# Patient Record
Sex: Male | Born: 1970
Health system: Southern US, Community
[De-identification: ages and names within clinical notes are randomized; demographics above are authoritative.]

## PROBLEM LIST (undated history)

## (undated) DIAGNOSIS — J45909 Unspecified asthma, uncomplicated: Secondary | ICD-10-CM

## (undated) DIAGNOSIS — D649 Anemia, unspecified: Secondary | ICD-10-CM

## (undated) DIAGNOSIS — I10 Essential (primary) hypertension: Secondary | ICD-10-CM

## (undated) DIAGNOSIS — R000 Tachycardia, unspecified: Secondary | ICD-10-CM

## (undated) DIAGNOSIS — T7840XA Allergy, unspecified, initial encounter: Secondary | ICD-10-CM

## (undated) DIAGNOSIS — K852 Alcohol induced acute pancreatitis without necrosis or infection: Secondary | ICD-10-CM

## (undated) DIAGNOSIS — F419 Anxiety disorder, unspecified: Secondary | ICD-10-CM

## (undated) DIAGNOSIS — J40 Bronchitis, not specified as acute or chronic: Secondary | ICD-10-CM

## (undated) HISTORY — PX: CYSTECTOMY: SUR359

## (undated) HISTORY — PX: HERNIA REPAIR: SHX51

## (undated) HISTORY — DX: Anxiety disorder, unspecified: F41.9

## (undated) HISTORY — DX: Essential (primary) hypertension: I10

## (undated) HISTORY — DX: Tachycardia, unspecified: R00.0

## (undated) HISTORY — DX: Allergy, unspecified, initial encounter: T78.40XA

---

## 2003-12-20 ENCOUNTER — Emergency Department (HOSPITAL_COMMUNITY): Admission: EM | Admit: 2003-12-20 | Discharge: 2003-12-20 | Payer: Self-pay | Admitting: Emergency Medicine

## 2004-05-01 ENCOUNTER — Encounter: Admission: RE | Admit: 2004-05-01 | Discharge: 2004-05-01 | Payer: Self-pay | Admitting: Orthopedic Surgery

## 2006-01-12 ENCOUNTER — Emergency Department (HOSPITAL_COMMUNITY): Admission: EM | Admit: 2006-01-12 | Discharge: 2006-01-12 | Payer: Self-pay | Admitting: Emergency Medicine

## 2007-06-26 ENCOUNTER — Emergency Department (HOSPITAL_COMMUNITY): Admission: EM | Admit: 2007-06-26 | Discharge: 2007-06-27 | Payer: Self-pay | Admitting: Emergency Medicine

## 2011-05-06 LAB — BASIC METABOLIC PANEL
BUN: 5 — ABNORMAL LOW
CO2: 26
Calcium: 8.7
Chloride: 105
Creatinine, Ser: 0.99
GFR calc Af Amer: >60
GFR calc non Af Amer: >60
Glucose, Bld: 109 — ABNORMAL HIGH
Potassium: 4.4
Sodium: 139

## 2011-05-06 LAB — URINALYSIS, ROUTINE W REFLEX MICROSCOPIC
Bilirubin Urine: NEGATIVE
Glucose, UA: NEGATIVE
Hgb urine dipstick: NEGATIVE
Ketones, ur: NEGATIVE
Nitrite: NEGATIVE
Protein, ur: NEGATIVE
Specific Gravity, Urine: 1.01
Urobilinogen, UA: 0.2
pH: 5.5

## 2011-05-06 LAB — RAPID URINE DRUG SCREEN, HOSP PERFORMED
Amphetamines: NOT DETECTED
Barbiturates: NOT DETECTED
Benzodiazepines: NOT DETECTED
Cocaine: POSITIVE — AB
Opiates: NOT DETECTED
Tetrahydrocannabinol: NOT DETECTED

## 2011-05-06 LAB — DIFFERENTIAL
Basophils Absolute: 0
Basophils Relative: 0
Eosinophils Absolute: 0.1 — ABNORMAL LOW
Eosinophils Relative: 1
Lymphocytes Relative: 37
Lymphs Abs: 3.4
Monocytes Absolute: 0.5
Monocytes Relative: 5
Neutro Abs: 5.3
Neutrophils Relative %: 57

## 2011-05-06 LAB — CBC
HCT: 55.4 — ABNORMAL HIGH
Hemoglobin: 18.9 — ABNORMAL HIGH
MCHC: 34.1
MCV: 88.5
Platelets: 209
RBC: 6.26 — ABNORMAL HIGH
RDW: 13.1
WBC: 9.3

## 2011-05-06 LAB — ETHANOL
Alcohol, Ethyl (B): 361 — ABNORMAL HIGH
Alcohol, Ethyl (B): 68 — ABNORMAL HIGH

## 2011-07-17 ENCOUNTER — Other Ambulatory Visit: Payer: Self-pay

## 2011-07-17 ENCOUNTER — Emergency Department (HOSPITAL_COMMUNITY)
Admission: EM | Admit: 2011-07-17 | Discharge: 2011-07-18 | Disposition: A | Discharge status: Home / Self Care | Payer: Self-pay | Attending: Emergency Medicine | Admitting: Emergency Medicine

## 2011-07-17 ENCOUNTER — Emergency Department (HOSPITAL_COMMUNITY): Payer: Self-pay

## 2011-07-17 ENCOUNTER — Encounter: Payer: Self-pay | Admitting: Emergency Medicine

## 2011-07-17 DIAGNOSIS — R079 Chest pain, unspecified: Secondary | ICD-10-CM | POA: Insufficient documentation

## 2011-07-17 DIAGNOSIS — T7840XA Allergy, unspecified, initial encounter: Secondary | ICD-10-CM | POA: Insufficient documentation

## 2011-07-17 DIAGNOSIS — R059 Cough, unspecified: Secondary | ICD-10-CM | POA: Insufficient documentation

## 2011-07-17 DIAGNOSIS — F411 Generalized anxiety disorder: Secondary | ICD-10-CM | POA: Insufficient documentation

## 2011-07-17 DIAGNOSIS — J209 Acute bronchitis, unspecified: Secondary | ICD-10-CM | POA: Insufficient documentation

## 2011-07-17 DIAGNOSIS — R05 Cough: Secondary | ICD-10-CM | POA: Insufficient documentation

## 2011-07-17 DIAGNOSIS — F17200 Nicotine dependence, unspecified, uncomplicated: Secondary | ICD-10-CM | POA: Insufficient documentation

## 2011-07-17 DIAGNOSIS — R42 Dizziness and giddiness: Secondary | ICD-10-CM | POA: Insufficient documentation

## 2011-07-17 DIAGNOSIS — I10 Essential (primary) hypertension: Secondary | ICD-10-CM | POA: Insufficient documentation

## 2011-07-17 DIAGNOSIS — R110 Nausea: Secondary | ICD-10-CM | POA: Insufficient documentation

## 2011-07-17 LAB — BASIC METABOLIC PANEL
BUN: 3 mg/dL — ABNORMAL LOW (ref 6–23)
CO2: 20 mEq/L (ref 19–32)
Calcium: 9.7 mg/dL (ref 8.4–10.5)
Chloride: 98 mEq/L (ref 96–112)
Creatinine, Ser: 0.6 mg/dL (ref 0.50–1.35)
GFR calc Af Amer: >90 mL/min (ref >90)
GFR calc non Af Amer: >90 mL/min (ref >90)
Glucose, Bld: 88 mg/dL (ref 70–99)
Potassium: 3.8 mEq/L (ref 3.5–5.1)
Sodium: 136 mEq/L (ref 135–145)

## 2011-07-17 LAB — CBC
HCT: 51.1 % (ref 39.0–52.0)
Hemoglobin: 17.5 g/dL — ABNORMAL HIGH (ref 13.0–17.0)
MCH: 30.7 pg (ref 26.0–34.0)
MCHC: 34.2 g/dL (ref 30.0–36.0)
MCV: 89.6 fL (ref 78.0–100.0)
Platelets: 144 10*3/uL — ABNORMAL LOW (ref 150–400)
RBC: 5.7 MIL/uL (ref 4.22–5.81)
RDW: 13.1 % (ref 11.5–15.5)
WBC: 11 10*3/uL — ABNORMAL HIGH (ref 4.0–10.5)

## 2011-07-17 LAB — URINALYSIS, ROUTINE W REFLEX MICROSCOPIC
Bilirubin Urine: NEGATIVE
Glucose, UA: NEGATIVE mg/dL
Hgb urine dipstick: NEGATIVE
Ketones, ur: NEGATIVE mg/dL
Leukocytes, UA: NEGATIVE
Nitrite: NEGATIVE
Protein, ur: NEGATIVE mg/dL
Specific Gravity, Urine: <1.005 — ABNORMAL LOW (ref 1.005–1.030)
Urobilinogen, UA: 0.2 mg/dL (ref 0.0–1.0)
pH: 6 (ref 5.0–8.0)

## 2011-07-17 LAB — CARDIAC PANEL(CRET KIN+CKTOT+MB+TROPI)
CK, MB: 2.9 ng/mL (ref 0.3–4.0)
Relative Index: 2.7 — ABNORMAL HIGH (ref 0.0–2.5)
Total CK: 109 U/L (ref 7–232)
Troponin I: <0.3 ng/mL (ref <0.30)

## 2011-07-17 LAB — POCT I-STAT TROPONIN I: Troponin i, poc: 0 ng/mL (ref 0.00–0.08)

## 2011-07-17 LAB — D-DIMER, QUANTITATIVE: D-Dimer, Quant: 0.52 ug/mL-FEU — ABNORMAL HIGH (ref 0.00–0.48)

## 2011-07-17 MED ORDER — SODIUM CHLORIDE 0.9 % IV SOLN
Freq: Once | INTRAVENOUS | Status: AC
  Administered 2011-07-17: via INTRAVENOUS

## 2011-07-17 MED ORDER — FAMOTIDINE IN NACL 20-0.9 MG/50ML-% IV SOLN
20.0000 mg | Freq: Once | INTRAVENOUS | Status: AC
  Administered 2011-07-17: 20 mg via INTRAVENOUS
  Filled 2011-07-17: qty 50

## 2011-07-17 MED ORDER — AZITHROMYCIN 250 MG PO TABS: ORAL_TABLET | ORAL | Status: DC

## 2011-07-17 MED ORDER — METHYLPREDNISOLONE SODIUM SUCC 125 MG IJ SOLR
125.0000 mg | Freq: Once | INTRAMUSCULAR | Status: AC
  Administered 2011-07-17: 125 mg via INTRAVENOUS
  Filled 2011-07-17: qty 2

## 2011-07-17 MED ORDER — DIPHENHYDRAMINE HCL 50 MG/ML IJ SOLN
25.0000 mg | Freq: Once | INTRAMUSCULAR | Status: AC
  Administered 2011-07-17: 25 mg via INTRAVENOUS
  Filled 2011-07-17: qty 1

## 2011-07-17 MED ORDER — DIPHENHYDRAMINE HCL 25 MG PO TABS: ORAL_TABLET | ORAL | Status: DC

## 2011-07-17 MED ORDER — AZITHROMYCIN 250 MG PO TABS
500.0000 mg | ORAL_TABLET | Freq: Once | ORAL | Status: AC
  Administered 2011-07-17: 500 mg via ORAL
  Filled 2011-07-17: qty 2

## 2011-07-17 NOTE — ED Provider Notes (Cosign Needed)
History     CSN: [id number]  Arrival date & time 07/17/11  2137   First MD Initiated Contact with Patient 07/17/11 2149      Chief Complaint  Patient presents with  . Chest Pain    (Consider location/radiation/quality/duration/timing/severity/associated sxs/prior treatment) HPI  History reviewed. No pertinent past medical history.  Past Surgical History  Procedure Date  . Hernia repair   . Cystectomy     No family history on file.  History  Substance Use Topics  . Smoking status: Current Some Day Smoker  . Smokeless tobacco: Not on file  . Alcohol Use: Yes      Review of Systems  Allergies  Grapefruit diet or  Home Medications  No current outpatient prescriptions on file.  BP 143/91  Pulse 120  Temp(Src) 99.1 F (37.3 C) (Oral)  Resp 16  Ht 5\' 9"  (1.753 m)  Wt 167 lb (75.751 kg)  BMI 24.66 kg/m2  SpO2 100%  Physical Exam  ED Course  Procedures (including critical care time)   9:49 PM  Date: 07/17/2011  Rate:118  Rhythm: sinus tachycardia  QRS Axis: normal  Intervals: normal PQRS:  Left atrial hypertrophy  ST/T Wave abnormalities: normal  Conduction Disutrbances:none  Narrative Interpretation: Borderline EKG  Old EKG Reviewed: none available    1. Acute bronchitis   2. Allergic reaction            Carleene Cooper III, MD 07/18/11 [phone]

## 2011-07-17 NOTE — ED Provider Notes (Signed)
History  Scribed for Carleene Cooper III, MD, the patient was seen in room [facility]04/[facility]04. This chart was scribed by Candelaria Stagers. The patient's care started at 9:55 PM    CSN: [id number]  Arrival date & time 07/17/11  2137   First MD Initiated Contact with Patient 07/17/11 2149      Chief Complaint  Patient presents with  . Chest Pain     The history is provided by the patient.   Lee Sparks is a 40 y.o. male who presents to the Emergency Department complaining of constant stabbing chest pain on both sides of the chest that started about three days ago.  He reports that he was sick with pneumonia, fever and cold sx about one month ago and is feeling similar sx now.  Pt states he is feeling anxiety from these sx, experiencing a productive cough, nausea, light headedness.  He denies dysuria, rash, and syncope.  He has not taken any medication for this pain.  Has h/o of hiatal hernia repair surgery.  Pt reports he occasionally smokes.    History reviewed. No pertinent past medical history.  Past Surgical History  Procedure Date  . Hernia repair   . Cystectomy     No family history on file.  History  Substance Use Topics  . Smoking status: Current Some Day Smoker  . Smokeless tobacco: Not on file  . Alcohol Use: Yes      Review of Systems  Constitutional: Negative for fever.       10 Systems reviewed and are negative for acute change except as noted in the HPI.  HENT: Negative for congestion.   Eyes: Negative for discharge and redness.  Respiratory: Negative for cough and shortness of breath.   Cardiovascular: Positive for chest pain.  Gastrointestinal: Positive for nausea. Negative for vomiting and abdominal pain.  Musculoskeletal: Negative for back pain.  Skin: Positive for color change. Negative for rash.  Neurological: Positive for light-headedness. Negative for syncope, numbness and headaches.  Psychiatric/Behavioral:       No behavior change.    Allergies    Grapefruit diet or  Home Medications   Current Outpatient Rx  Name Route Sig Dispense Refill  . ZANTAC PO Oral Take 1 tablet by mouth as needed. For heartburn       BP 143/91  Pulse 120  Temp(Src) 99.1 F (37.3 C) (Oral)  Resp 16  Ht 5\' 9"  (1.753 m)  Wt 167 lb (75.751 kg)  BMI 24.66 kg/m2  SpO2 100%  Physical Exam  Nursing note and vitals reviewed. Constitutional: He is oriented to person, place, and time. He appears well-developed and well-nourished. No distress.       Awake, alert, nontoxic appearance.  HENT:  Head: Normocephalic and atraumatic.  Mouth/Throat: No oropharyngeal exudate.       Tonsillar erythemas, throat is dry.   Eyes: Conjunctivae are normal. Pupils are equal, round, and reactive to light. Right eye exhibits no discharge. Left eye exhibits no discharge.  Neck: Normal range of motion. Neck supple.  Cardiovascular: Normal rate and regular rhythm.  Exam reveals no gallop and no friction rub.   No murmur heard. Pulmonary/Chest: Effort normal. He exhibits no tenderness.  Abdominal: Soft. There is no tenderness. There is no rebound.  Musculoskeletal: He exhibits no edema and no tenderness.       Baseline ROM, no obvious new focal weakness.  Lymphadenopathy:    He has no cervical adenopathy.  Neurological: He is alert and oriented  to person, place, and time.       Mental status and motor strength appears baseline for patient and situation.  Skin: No rash noted. There is erythema.  Psychiatric: He has a normal mood and affect.    ED Course  Procedures  DIAGNOSTIC STUDIES: Oxygen Saturation is 100% on room air, normal by my interpretation.    COORDINATION OF CARE: 9:52PM Ordered: New - ED EKG ; I-Stat tropoinin I cardiac marker ; CBC ; Basic metabolic panel ; DG Chest Portable 1 View  10:02PM Ordered: URINALYSIS, ROUTINE W REFLEX MICROSCOPIC, D-DIMER, QUANTITATIVE, CARDIAC PANEL(CRET KIN+CKTOT+MB+TROPI)  10:38PM Ordered: DG Chest 2 View  11:21PM  Recheck: Discussed course of care with pt.   Labs Reviewed  CBC - Abnormal; Notable for the following:    WBC 11.0 (*)    Hemoglobin 17.5 (*)    Platelets 144 (*)    All other components within normal limits  BASIC METABOLIC PANEL - Abnormal; Notable for the following:    BUN 3 (*)    All other components within normal limits  CARDIAC PANEL(CRET KIN+CKTOT+MB+TROPI) - Abnormal; Notable for the following:    Relative Index 2.7 (*)    All other components within normal limits  D-DIMER, QUANTITATIVE - Abnormal; Notable for the following:    D-Dimer, Quant 0.52 (*)    All other components within normal limits  URINALYSIS, ROUTINE W REFLEX MICROSCOPIC - Abnormal; Notable for the following:    Specific Gravity, Urine <1.005 (*)    All other components within normal limits  POCT I-STAT TROPONIN I   *RADIOLOGY REPORT*  Clinical Data: Possible opacity on the portable view.  CHEST - 2 VIEW  Comparison: 07/17/2011  Findings: Questionable lesion on portable view corresponds to  callus formation along the posterolateral 6th rib on the right,  suggesting remote injury. No lung or acute osseous abnormality  identified. Cardiomediastinal contours within normal limits.  IMPRESSION:  No acute cardiopulmonary process.  Original Report Authenticated By: Waneta Martins, M.D.  RADIOLOGY REPORT* Clinical Data: Chest pain PORTABLE CHEST - 1 VIEW Comparison: 12/20/2003 Findings: Nodular opacity projecting over the right scapula. Lungs are otherwise clear. No pleural effusion or pneumothorax. The cardiomediastinal contours are within normal limits. The visualized bones and soft tissues are without significant appreciable abnormality. IMPRESSION: Nodular opacity projecting over the right scapula. May represent superimposed shadows. Recommend PA and lateral radiographs when the patient can tolerate to better characterize. Original Report Authenticated By: Waneta Martins, M.D.  9:49 PM   Date: 07/17/2011  Rate:118  Rhythm: sinus tachycardia  QRS Axis: normal  Intervals: normal PQRS:  Left atrial hypertrophy  ST/T Wave abnormalities: normal  Conduction Disutrbances:none  Narrative Interpretation: Borderline EKG  Old EKG Reviewed: none available    Diagnosis: Acute Bronchitis and acute allergic reaction to unknown allergen.   1. Acute bronchitis   2. Allergic reaction     Will prescribe benadryl, zantac, solumedrol, and antibiotic for bronchitis.   I personally performed the services described in this documentation, which was scribed in my presence. The recorded information has been reviewed and considered.  Carleene Cooper III M.D.   Carleene Cooper III, MD 07/18/11 [phone]

## 2011-07-17 NOTE — ED Notes (Signed)
Per EMS, pt has been having chest pain for the past three days.  Pt reports productive cough x three days.  Pt reports he drank a 6 pack of beer prior to calling EMS.

## 2011-07-18 NOTE — ED Notes (Signed)
Patient is alert and oriented x 4 with respirations even and unlabored.  NAD at this time.  Discharge instructions reviewed with patient and patient verbalized understanding.  Pt ambulated to lobby with steady gait, and father to transport pt home.

## 2011-07-27 ENCOUNTER — Other Ambulatory Visit: Payer: Self-pay

## 2011-07-27 ENCOUNTER — Encounter (HOSPITAL_COMMUNITY): Payer: Self-pay | Admitting: Emergency Medicine

## 2011-07-27 ENCOUNTER — Inpatient Hospital Stay (HOSPITAL_COMMUNITY)
Admission: EM | Admit: 2011-07-27 | Discharge: 2011-08-01 | DRG: 369 | Disposition: A | Discharge status: Home / Self Care | Payer: Self-pay | Attending: Internal Medicine | Admitting: Internal Medicine

## 2011-07-27 DIAGNOSIS — F10231 Alcohol dependence with withdrawal delirium: Secondary | ICD-10-CM | POA: Diagnosis present

## 2011-07-27 DIAGNOSIS — F10939 Alcohol use, unspecified with withdrawal, unspecified: Secondary | ICD-10-CM | POA: Diagnosis present

## 2011-07-27 DIAGNOSIS — F10931 Alcohol use, unspecified with withdrawal delirium: Secondary | ICD-10-CM | POA: Diagnosis present

## 2011-07-27 DIAGNOSIS — D696 Thrombocytopenia, unspecified: Secondary | ICD-10-CM | POA: Diagnosis present

## 2011-07-27 DIAGNOSIS — R109 Unspecified abdominal pain: Secondary | ICD-10-CM | POA: Diagnosis present

## 2011-07-27 DIAGNOSIS — F1410 Cocaine abuse, uncomplicated: Secondary | ICD-10-CM | POA: Diagnosis present

## 2011-07-27 DIAGNOSIS — F10239 Alcohol dependence with withdrawal, unspecified: Secondary | ICD-10-CM | POA: Diagnosis present

## 2011-07-27 DIAGNOSIS — Z87891 Personal history of nicotine dependence: Secondary | ICD-10-CM

## 2011-07-27 DIAGNOSIS — K922 Gastrointestinal hemorrhage, unspecified: Secondary | ICD-10-CM | POA: Diagnosis present

## 2011-07-27 DIAGNOSIS — R630 Anorexia: Secondary | ICD-10-CM | POA: Diagnosis present

## 2011-07-27 DIAGNOSIS — K226 Gastro-esophageal laceration-hemorrhage syndrome: Principal | ICD-10-CM | POA: Diagnosis present

## 2011-07-27 DIAGNOSIS — R000 Tachycardia, unspecified: Secondary | ICD-10-CM | POA: Diagnosis present

## 2011-07-27 DIAGNOSIS — I10 Essential (primary) hypertension: Secondary | ICD-10-CM | POA: Diagnosis present

## 2011-07-27 DIAGNOSIS — F1020 Alcohol dependence, uncomplicated: Secondary | ICD-10-CM | POA: Diagnosis present

## 2011-07-27 HISTORY — DX: Anemia, unspecified: D64.9

## 2011-07-27 LAB — CBC
Hemoglobin: 17.1 g/dL — ABNORMAL HIGH (ref 13.0–17.0)
MCH: 30.6 pg (ref 26.0–34.0)
RBC: 5.58 MIL/uL (ref 4.22–5.81)

## 2011-07-27 LAB — DIFFERENTIAL
Eosinophils Absolute: 0 10*3/uL (ref 0.0–0.7)
Lymphs Abs: 1.5 10*3/uL (ref 0.7–4.0)
Monocytes Relative: 10 % (ref 3–12)
Neutro Abs: 12.5 10*3/uL — ABNORMAL HIGH (ref 1.7–7.7)
Neutrophils Relative %: 80 % — ABNORMAL HIGH (ref 43–77)

## 2011-07-27 LAB — COMPREHENSIVE METABOLIC PANEL
Alkaline Phosphatase: 98 U/L (ref 39–117)
BUN: 18 mg/dL (ref 6–23)
Chloride: 94 mEq/L — ABNORMAL LOW (ref 96–112)
GFR calc Af Amer: >90 mL/min (ref >90)
GFR calc non Af Amer: >90 mL/min (ref >90)
Glucose, Bld: 141 mg/dL — ABNORMAL HIGH (ref 70–99)
Potassium: 4.1 mEq/L (ref 3.5–5.1)
Total Bilirubin: 2 mg/dL — ABNORMAL HIGH (ref 0.3–1.2)

## 2011-07-27 LAB — APTT: aPTT: 28 seconds (ref 24–37)

## 2011-07-27 MED ORDER — LORAZEPAM 2 MG/ML IJ SOLN
1.0000 mg | Freq: Once | INTRAMUSCULAR | Status: AC
  Administered 2011-07-27: 1 mg via INTRAVENOUS
  Filled 2011-07-27: qty 1

## 2011-07-27 MED ORDER — HYDROMORPHONE HCL PF 1 MG/ML IJ SOLN
0.5000 mg | INTRAMUSCULAR | Status: DC | PRN
  Administered 2011-07-27 – 2011-07-29 (×5): 0.5 mg via INTRAVENOUS
  Filled 2011-07-27 (×6): qty 1

## 2011-07-27 MED ORDER — THIAMINE HCL 100 MG/ML IJ SOLN
100.0000 mg | Freq: Every day | INTRAMUSCULAR | Status: DC
  Administered 2011-07-29: 100 mg via INTRAVENOUS
  Filled 2011-07-27 (×6): qty 1

## 2011-07-27 MED ORDER — LORAZEPAM 2 MG/ML IJ SOLN: 1.0000 mg | Freq: Four times a day (QID) | INTRAMUSCULAR | Status: AC | PRN

## 2011-07-27 MED ORDER — SODIUM CHLORIDE 0.9 % IV BOLUS (SEPSIS)
1000.0000 mL | Freq: Once | INTRAVENOUS | Status: AC
  Administered 2011-07-27: 1000 mL via INTRAVENOUS

## 2011-07-27 MED ORDER — LORAZEPAM 2 MG/ML IJ SOLN: 0.0000 mg | Freq: Four times a day (QID) | INTRAMUSCULAR | Status: AC

## 2011-07-27 MED ORDER — FAMOTIDINE IN NACL 20-0.9 MG/50ML-% IV SOLN
20.0000 mg | Freq: Once | INTRAVENOUS | Status: AC
  Administered 2011-07-27: 20 mg via INTRAVENOUS
  Filled 2011-07-27: qty 50

## 2011-07-27 MED ORDER — SODIUM CHLORIDE 0.9 % IV SOLN
8.0000 mg/h | INTRAVENOUS | Status: DC
  Administered 2011-07-27: 8 mg/h via INTRAVENOUS
  Filled 2011-07-27 (×6): qty 80

## 2011-07-27 MED ORDER — ONDANSETRON HCL 4 MG/2ML IJ SOLN
4.0000 mg | Freq: Once | INTRAMUSCULAR | Status: AC
  Administered 2011-07-27: 4 mg via INTRAVENOUS
  Filled 2011-07-27: qty 2

## 2011-07-27 MED ORDER — PANTOPRAZOLE SODIUM 40 MG IV SOLR
40.0000 mg | Freq: Once | INTRAVENOUS | Status: AC
  Administered 2011-07-27: 40 mg via INTRAVENOUS
  Filled 2011-07-27: qty 40

## 2011-07-27 MED ORDER — POTASSIUM CHLORIDE IN NACL 20-0.9 MEQ/L-% IV SOLN
INTRAVENOUS | Status: DC
  Administered 2011-07-27 – 2011-07-29 (×4): via INTRAVENOUS
  Filled 2011-07-27 (×6): qty 1000

## 2011-07-27 MED ORDER — VITAMIN B-1 100 MG PO TABS
100.0000 mg | ORAL_TABLET | Freq: Every day | ORAL | Status: DC
  Administered 2011-07-27 – 2011-08-01 (×5): 100 mg via ORAL
  Filled 2011-07-27 (×6): qty 1

## 2011-07-27 MED ORDER — FOLIC ACID 1 MG PO TABS
1.0000 mg | ORAL_TABLET | Freq: Every day | ORAL | Status: DC
  Administered 2011-07-27 – 2011-08-01 (×6): 1 mg via ORAL
  Filled 2011-07-27 (×6): qty 1

## 2011-07-27 MED ORDER — LORAZEPAM 1 MG PO TABS
1.0000 mg | ORAL_TABLET | Freq: Four times a day (QID) | ORAL | Status: AC | PRN
  Administered 2011-07-30: 1 mg via ORAL
  Filled 2011-07-27: qty 1

## 2011-07-27 MED ORDER — SODIUM CHLORIDE 0.9 % IV SOLN
Freq: Once | INTRAVENOUS | Status: AC
  Administered 2011-07-27: 17:00:00 via INTRAVENOUS

## 2011-07-27 MED ORDER — LORAZEPAM 2 MG/ML IJ SOLN
0.0000 mg | Freq: Two times a day (BID) | INTRAMUSCULAR | Status: AC
  Administered 2011-07-31: 1 mg via INTRAVENOUS
  Filled 2011-07-27: qty 1

## 2011-07-27 NOTE — ED Provider Notes (Signed)
History   This chart was scribed for Toy Baker, MD by Clarita Crane. The patient was seen in room APA12/[facility]12 and the patient's care was started at 2:16PM.   CSN: [id number]  Arrival date & time 07/27/11  1323   First MD Initiated Contact with Patient 07/27/11 1413      Chief Complaint  Patient presents with  . Hemoptysis  . Emesis  . Chest Pain  . Weakness    (Consider location/radiation/quality/duration/timing/severity/associated sxs/prior treatment) HPI Lee Sparks is a 40 y.o. male who presents to the Emergency Department complaining of multiple episodes of severe hematemesis onset last night and persistent since with associated abdominal pain, decreased appetite, decreased fluid intake and lightheadedness. Reports having approximately 5-7 episodes of hematemesis since last night.  Patient reports extensive ETOH use stating he drinks approximately two 40oz beers per day. Denies melena, previous similar symptoms and history of bleeding ulcers. Nothing makes his sx better or worse  History reviewed. No pertinent past medical history.  Past Surgical History  Procedure Date  . Hernia repair   . Cystectomy     History reviewed. No pertinent family history.  History  Substance Use Topics  . Smoking status: Former Smoker -- 0.0 packs/day for 15 years    Types: Cigarettes    Quit date: 06/27/2011  . Smokeless tobacco: Never Used  . Alcohol Use: 8.4 oz/week    14 Cans of beer per week      Review of Systems 10 Systems reviewed and are negative for acute change except as noted in the HPI.  Allergies  Grapefruit diet or  Home Medications   Current Outpatient Rx  Name Route Sig Dispense Refill  . AZITHROMYCIN 250 MG PO TABS  Take first 2 tablets today, then 1 every day until finished. 6 tablet 0  . DIPHENHYDRAMINE HCL 25 MG PO TABS  Take 2 capsule every six hours if needed for allergic redness and itching.  Buy over-the-counter. 20 tablet 0  . ZANTAC PO Oral  Take 1 tablet by mouth as needed. For heartburn       BP 145/116  Pulse 130  Resp 20  Ht 5\' 8"  (1.727 m)  Wt 165 lb (74.844 kg)  BMI 25.09 kg/m2  SpO2 98%  Physical Exam  Nursing note and vitals reviewed. Constitutional: He is oriented to person, place, and time. He appears well-developed and well-nourished.  HENT:  Head: Normocephalic and atraumatic.  Eyes: EOM are normal. Pupils are equal, round, and reactive to light.  Neck: Neck supple. No tracheal deviation present.  Cardiovascular: Regular rhythm.  Tachycardia present.   No murmur heard. Pulmonary/Chest: Effort normal. No respiratory distress. He has no wheezes.  Abdominal: Soft. He exhibits no distension. There is no tenderness. There is no rebound and no guarding.  Musculoskeletal: Normal range of motion. He exhibits no edema.  Neurological: He is alert and oriented to person, place, and time. No sensory deficit.  Skin: There is pallor.       Slightly pale.   Psychiatric: His behavior is normal.    ED Course  Procedures (including critical care time)  DIAGNOSTIC STUDIES: Oxygen Saturation is 98% on room air, normal by my interpretation.    COORDINATION OF CARE: 2:23PM- Patient explained current clinical impression and intent to obtain multiple blood labs and administer antiemetics.   Labs Reviewed - No data to display No results found.   No diagnosis found.    MDM  Labs and iv fluids ordered and results  pending, pt signed out to on coming edp who will dispo  I personally performed the services described in this documentation, which was scribed in my presence. The recorded information has been reviewed and considered.        I personally performed the services described in this documentation, which was scribed in my presence. The recorded information has been reviewed and considered.     Toy Baker, MD 08/06/11 [phone]

## 2011-07-27 NOTE — ED Notes (Signed)
Patient c/o nausea, vomiting, coughing up blood, and "racing heart beat" Since last night. Per patient was coughing up a large amount of blood but it's starting to decrease.

## 2011-07-27 NOTE — Progress Notes (Signed)
1510 Assumed care/disposition of patient. Patient with hemoptysis x 6 since last night. Heavy ETOH use in last few days. VSS. Marland Kitchen Awaiting labs and then admission.  Date: 07/27/2011  1413  Rate: 115  Rhythm: sinus tachycardia  QRS Axis: normal  Intervals: normal  ST/T Wave abnormalities: normal  Conduction Disutrbances:none  Narrative Interpretation:   Old EKG Reviewed: none available  Results for orders placed during the hospital encounter of 07/27/11  CBC      Component Value Range   WBC 15.7 (*) 4.0 - 10.5 (K/uL)   RBC 5.58  4.22 - 5.81 (MIL/uL)   Hemoglobin 17.1 (*) 13.0 - 17.0 (g/dL)   HCT 40.9  81.1 - 91.4 (%)   MCV 91.0  78.0 - 100.0 (fL)   MCH 30.6  26.0 - 34.0 (pg)   MCHC 33.7  30.0 - 36.0 (g/dL)   RDW 78.2  95.6 - 21.3 (%)   Platelets 107 (*) 150 - 400 (K/uL)  DIFFERENTIAL      Component Value Range   Neutrophils Relative 80 (*) 43 - 77 (%)   Neutro Abs 12.5 (*) 1.7 - 7.7 (K/uL)   Lymphocytes Relative 10 (*) 12 - 46 (%)   Lymphs Abs 1.5  0.7 - 4.0 (K/uL)   Monocytes Relative 10  3 - 12 (%)   Monocytes Absolute 1.6 (*) 0.1 - 1.0 (K/uL)   Eosinophils Relative 0  0 - 5 (%)   Eosinophils Absolute 0.0  0.0 - 0.7 (K/uL)   Basophils Relative 0  0 - 1 (%)   Basophils Absolute 0.1  0.0 - 0.1 (K/uL)  COMPREHENSIVE METABOLIC PANEL      Component Value Range   Sodium 137  135 - 145 (mEq/L)   Potassium 4.1  3.5 - 5.1 (mEq/L)   Chloride 94 (*) 96 - 112 (mEq/L)   CO2 26  19 - 32 (mEq/L)   Glucose, Bld 141 (*) 70 - 99 (mg/dL)   BUN 18  6 - 23 (mg/dL)   Creatinine, Ser 0.86  0.50 - 1.35 (mg/dL)   Calcium 57.8 (*) 8.4 - 10.5 (mg/dL)   Total Protein 8.3  6.0 - 8.3 (g/dL)   Albumin 4.3  3.5 - 5.2 (g/dL)   AST 72 (*) 0 - 37 (U/L)   ALT 52  0 - 53 (U/L)   Alkaline Phosphatase 98  39 - 117 (U/L)   Total Bilirubin 2.0 (*) 0.3 - 1.2 (mg/dL)   GFR calc non Af Amer >90  >90 (mL/min)   GFR calc Af Amer >90  >90 (mL/min)  APTT      Component Value Range   aPTT 28  24 - 37 (seconds)    PROTIME-INR      Component Value Range   Prothrombin Time 14.8  11.6 - 15.2 (seconds)   INR 1.14  0.00 - 1.49   LIPASE, BLOOD      Component Value Range   Lipase 22  11 - 59 (U/L)  SAMPLE TO BLOOD BANK      Component Value Range   Blood Bank Specimen SAMPLE AVAILABLE FOR TESTING     Sample Expiration 07/30/2011     ABO/RH(D) PENDING      1555 Labs with stable hgb. Elevated bilirubin. Call to hospitalist  for admission. 1610 Spoke with Dr. Sherrie Mustache. She advised there are no beds at [facility]. Will need to talk with hopitalist at [facility]. 1620 Advised by [facility] that there are 3 beds available.  1625 Spoke with Dr. Sherrie Mustache.  She advised that since there is no GI coverage at [facility], she would be unable to take the patient here.  1640 Spoke with Dr. Gerri Lins, hospitalist at [facility] She has accepted the patient in transfer. She requested a telemetry bed and that we give a benzodiazepine prior to sending patient. BP 146/95, HR 118, R20. The patient appears reasonably stabilized for transfer considering the current resources, flow, and capabilities available in the ED at this time, and I doubt any other [facility] requiring further screening and/or treatment in the ED prior to transfer.

## 2011-07-27 NOTE — H&P (Signed)
Lee Sparks is an 40 y.o. male.   Chief Complaint: Vomiting Blood HPI: Pt is 40 yr old male who drinks two forty oz beers a night as a matter of course.  Last night he began vomiting up blood clots.  He states that the blood has been "thick and clumpy", then thinner and lighter in color.  He states that most recent emesis has been thick and clumpy again.  He states that his last drink was last night at 8:00 pm and that he is starting to feel shakey.  Pt had an elevated BP and HR in ED at [facility].  History reviewed. No pertinent past medical history.  Past Surgical History  Procedure Date  . Hernia repair   . Cystectomy     Family History  Problem Relation Age of Onset  . Cancer Other    Social History:  reports that he quit smoking about 4 weeks ago. His smoking use included Cigarettes. He has a .45 pack-year smoking history. He has never used smokeless tobacco. He reports that he drinks about 30 ounces of alcohol per week. He reports that he does not use illicit drugs. Medications Prior to Admission  Medication Dose Route Frequency Provider Last Rate Last Dose  . 0.9 %  sodium chloride infusion   Intravenous Once Toy Baker, MD 125 mL/hr at 07/27/11 1649    . 0.9 % NaCl with KCl 20 mEq/ L  infusion   Intravenous Continuous Mozetta Murfin, DO      . famotidine (PEPCID) IVPB 20 mg  20 mg Intravenous Once [employer]. Colon Branch, MD   20 mg at 07/27/11 1720  . folic acid (FOLVITE) tablet 1 mg  1 mg Oral Daily Eloina Ergle, DO      . HYDROmorphone (DILAUDID) injection 0.5 mg  0.5 mg Intravenous Q3H PRN Antwione Picotte, DO      . LORazepam (ATIVAN) injection 0-4 mg  0-4 mg Intravenous Q6H Mackenzee Becvar, DO       Followed by  . LORazepam (ATIVAN) injection 0-4 mg  0-4 mg Intravenous Q12H Anuradha Chabot, DO      . LORazepam (ATIVAN) injection 1 mg  1 mg Intravenous Once [employer]. Colon Branch, MD   1 mg at 07/27/11 1720  . LORazepam (ATIVAN) tablet 1 mg  1 mg Oral Q6H PRN Kierstyn Baranowski, DO       Or  . LORazepam  (ATIVAN) injection 1 mg  1 mg Intravenous Q6H PRN Marek Nghiem, DO      . ondansetron (ZOFRAN) injection 4 mg  4 mg Intravenous Once Toy Baker, MD   4 mg at 07/27/11 1424  . pantoprazole (PROTONIX) 80 mg in sodium chloride 0.9 % 250 mL infusion  8 mg/hr Intravenous Continuous Kaikoa Magro, DO      . pantoprazole (PROTONIX) injection 40 mg  40 mg Intravenous Once Toy Baker, MD   40 mg at 07/27/11 1424  . sodium chloride 0.9 % bolus 1,000 mL  1,000 mL Intravenous Once Toy Baker, MD   1,000 mL at 07/27/11 1424  . sodium chloride 0.9 % bolus 1,000 mL  1,000 mL Intravenous Once [employer]. Colon Branch, MD   1,000 mL at 07/27/11 1650  . sodium chloride 0.9 % bolus 1,000 mL  1,000 mL Intravenous Once [employer]. Colon Branch, MD   1,000 mL at 07/27/11 1857  . thiamine (VITAMIN B-1) tablet 100 mg  100 mg Oral Daily Makyiah Lie, DO  Or  . thiamine (B-1) injection 100 mg  100 mg Intravenous Daily Rindy Kollman, DO       Medications Prior to Admission  Medication Sig Dispense Refill  . azithromycin (ZITHROMAX) 250 MG tablet Take first 2 tablets today, then 1 every day until finished.  6 tablet  0  . diphenhydrAMINE (BENADRYL) 25 MG tablet Take 2 capsule every six hours if needed for allergic redness and itching.  Buy over-the-counter.  20 tablet  0    Allergies:  Allergies  Allergen Reactions  . Grapefruit Diet Or (Extra Strength Grapefruit) Hives    Pt states he has a reaction to grapefruit.     Constitutional: positive for chills, fatigue and sweats, negative for anorexia, fevers and weight loss Eyes: negative for irritation, redness and visual disturbance Ears, nose, mouth, throat, and face: negative for epistaxis, nasal congestion and sore throat Respiratory: negative for cough, pleurisy/chest pain, sputum and wheezing Cardiovascular: negative for chest pain, chest pressure/discomfort, dyspnea, exertional chest pressure/discomfort, irregular heart beat and syncope Gastrointestinal: positive for  nausea and vomiting, negative for constipation, diarrhea, dyspepsia, dysphagia, jaundice and melena Genitourinary:negative for dysuria, frequency, hematuria and hesitancy Hematologic/lymphatic: positive for bleeding, negative for easy bruising, lymphadenopathy and petechiae Musculoskeletal:negative for arthralgias, back pain, bone pain, muscle weakness and myalgias Neurological: negative for coordination problems, dizziness, gait problems, headaches, seizures and potisitve for tremors currently Behavioral/Psych: positive for anxiety, negative for depression, irritability and sleep disturbance   General appearance: alert, cooperative, appears stated age and mild distress Head: Normocephalic, without obvious abnormality, atraumatic Eyes: conjunctivae/corneas clear. PERRL, EOM's intact. Fundi benign. Throat: lips, mucosa, and tongue normal; teeth and gums normal Neck: no adenopathy, no carotid bruit, no JVD, supple, symmetrical, trachea midline and thyroid not enlarged, symmetric, no tenderness/mass/nodules Resp: clear to auscultation bilaterally Chest wall: no tenderness Cardio: regular rate and rhythm, S1, S2 normal, no murmur, click, rub or gallop and no rub GI: normal findings: bowel sounds normal, liver span normal to percussion, no masses palpable and spleen non-palpable and abnormal findings:  rebound tenderness Extremities: extremities normal, atraumatic, no cyanosis or edema Pulses: 2+ and symmetric Skin: Skin color, texture, turgor normal. No rashes or lesions Lymph nodes: Cervical, supraclavicular, and axillary nodes normal. Neurologic: Alert and oriented X 3, normal strength and tone. Normal symmetric reflexes. Normal coordination and gait   Results for orders placed during the hospital encounter of 07/27/11 (from the past 48 hour(s))  CBC     Status: Abnormal   Collection Time   07/27/11  2:39 PM      Component Value Range Comment   WBC 15.7 (*) 4.0 - 10.5 (K/uL)    RBC 5.58   4.22 - 5.81 (MIL/uL)    Hemoglobin 17.1 (*) 13.0 - 17.0 (g/dL)    HCT 96.0  45.4 - 09.8 (%)    MCV 91.0  78.0 - 100.0 (fL)    MCH 30.6  26.0 - 34.0 (pg)    MCHC 33.7  30.0 - 36.0 (g/dL)    RDW 11.9  14.7 - 82.9 (%)    Platelets 107 (*) 150 - 400 (K/uL)   DIFFERENTIAL     Status: Abnormal   Collection Time   07/27/11  2:39 PM      Component Value Range Comment   Neutrophils Relative 80 (*) 43 - 77 (%)    Neutro Abs 12.5 (*) 1.7 - 7.7 (K/uL)    Lymphocytes Relative 10 (*) 12 - 46 (%)    Lymphs Abs 1.5  0.7 -  4.0 (K/uL)    Monocytes Relative 10  3 - 12 (%)    Monocytes Absolute 1.6 (*) 0.1 - 1.0 (K/uL)    Eosinophils Relative 0  0 - 5 (%)    Eosinophils Absolute 0.0  0.0 - 0.7 (K/uL)    Basophils Relative 0  0 - 1 (%)    Basophils Absolute 0.1  0.0 - 0.1 (K/uL)   COMPREHENSIVE METABOLIC PANEL     Status: Abnormal   Collection Time   07/27/11  2:39 PM      Component Value Range Comment   Sodium 137  135 - 145 (mEq/L)    Potassium 4.1  3.5 - 5.1 (mEq/L)    Chloride 94 (*) 96 - 112 (mEq/L)    CO2 26  19 - 32 (mEq/L)    Glucose, Bld 141 (*) 70 - 99 (mg/dL)    BUN 18  6 - 23 (mg/dL)    Creatinine, Ser 8.84  0.50 - 1.35 (mg/dL)    Calcium 16.6 (*) 8.4 - 10.5 (mg/dL)    Total Protein 8.3  6.0 - 8.3 (g/dL)    Albumin 4.3  3.5 - 5.2 (g/dL)    AST 72 (*) 0 - 37 (U/L)    ALT 52  0 - 53 (U/L)    Alkaline Phosphatase 98  39 - 117 (U/L)    Total Bilirubin 2.0 (*) 0.3 - 1.2 (mg/dL)    GFR calc non Af Amer >90  >90 (mL/min)    GFR calc Af Amer >90  >90 (mL/min)   APTT     Status: Normal   Collection Time   07/27/11  2:39 PM      Component Value Range Comment   aPTT 28  24 - 37 (seconds)   PROTIME-INR     Status: Normal   Collection Time   07/27/11  2:39 PM      Component Value Range Comment   Prothrombin Time 14.8  11.6 - 15.2 (seconds)    INR 1.14  0.00 - 1.49    LIPASE, BLOOD     Status: Normal   Collection Time   07/27/11  2:39 PM      Component Value Range Comment   Lipase  22  11 - 59 (U/L)   SAMPLE TO BLOOD BANK     Status: Normal   Collection Time   07/27/11  2:39 PM      Component Value Range Comment   Blood Bank Specimen SAMPLE AVAILABLE FOR TESTING      Sample Expiration 07/30/2011      @[id number] @  Blood pressure 159/93, pulse 117, temperature 98.1 F (36.7 C), temperature source Oral, resp. rate 19, height 5\' 8"  (1.727 m), weight 71.3 kg (157 lb 3 oz), SpO2 97.00%.    Assessment/Plan 1.GI Bleed.  H&H stable thus far.  Will continue to monitor and transfuse as necessary.  Will give IV fluids, IV protonix and monitor H&H. Will consult GI in the am if pt is stable from a withdrawal standpoint. 2. Tachycardia - seems to respond well to IV Ativan.  Pt is likely in some withdrawal. 3. Hypertension - also responsive to IV ativan and likely is a consequence of withdrawal. 4. Alcohol withdrawal - shakes, htn and tachycardia.  Pt states his last drink was tonight, but most likely his blood alcohol has dropped significantly below his norm and he is withdrawing. 5. Abdominal pain secondary to #1  Irisha Grandmaison 07/27/2011, 9:01 PM

## 2011-07-27 NOTE — ED Notes (Signed)
Pt c/o vomiting blood, pt has emesis bag with moderate amount of blood noted. Dr. Freida Busman at bedside, see EDP assessment for further

## 2011-07-27 NOTE — ED Notes (Signed)
Patient given ns 1000 bolus per md request

## 2011-07-28 ENCOUNTER — Encounter (HOSPITAL_COMMUNITY)
Admission: EM | Disposition: A | Discharge status: Home / Self Care | Payer: Self-pay | Source: Home / Self Care | Attending: Internal Medicine

## 2011-07-28 ENCOUNTER — Encounter (HOSPITAL_COMMUNITY): Payer: Self-pay | Admitting: *Deleted

## 2011-07-28 DIAGNOSIS — F1410 Cocaine abuse, uncomplicated: Secondary | ICD-10-CM | POA: Insufficient documentation

## 2011-07-28 HISTORY — PX: ESOPHAGOGASTRODUODENOSCOPY: SHX5428

## 2011-07-28 LAB — COMPREHENSIVE METABOLIC PANEL
ALT: 30 U/L (ref 0–53)
AST: 38 U/L — ABNORMAL HIGH (ref 0–37)
CO2: 29 mEq/L (ref 19–32)
Calcium: 9 mg/dL (ref 8.4–10.5)
Chloride: 105 mEq/L (ref 96–112)
GFR calc non Af Amer: >90 mL/min (ref >90)
Potassium: 4.3 mEq/L (ref 3.5–5.1)
Sodium: 144 mEq/L (ref 135–145)

## 2011-07-28 LAB — PROTIME-INR: Prothrombin Time: 14.5 seconds (ref 11.6–15.2)

## 2011-07-28 LAB — CBC
HCT: 39.2 % (ref 39.0–52.0)
Hemoglobin: 12.9 g/dL — ABNORMAL LOW (ref 13.0–17.0)
Hemoglobin: 13 g/dL (ref 13.0–17.0)
MCH: 29.8 pg (ref 26.0–34.0)
MCH: 30.4 pg (ref 26.0–34.0)
MCH: 31 pg (ref 26.0–34.0)
MCHC: 32.5 g/dL (ref 30.0–36.0)
MCHC: 33.2 g/dL (ref 30.0–36.0)
MCHC: 33.6 g/dL (ref 30.0–36.0)
MCV: 92.3 fL (ref 78.0–100.0)
Platelets: 65 10*3/uL — ABNORMAL LOW (ref 150–400)
RDW: 13.5 % (ref 11.5–15.5)

## 2011-07-28 SURGERY — EGD (ESOPHAGOGASTRODUODENOSCOPY)
Anesthesia: Moderate Sedation

## 2011-07-28 MED ORDER — BUTAMBEN-TETRACAINE-BENZOCAINE 2-2-14 % EX AERO
INHALATION_SPRAY | CUTANEOUS | Status: DC | PRN
  Administered 2011-07-28: 2 via TOPICAL

## 2011-07-28 MED ORDER — ONDANSETRON HCL 4 MG/2ML IJ SOLN
4.0000 mg | Freq: Three times a day (TID) | INTRAMUSCULAR | Status: DC
  Administered 2011-07-28 – 2011-07-30 (×8): 4 mg via INTRAVENOUS
  Filled 2011-07-28 (×8): qty 2

## 2011-07-28 MED ORDER — FENTANYL CITRATE 0.05 MG/ML IJ SOLN
INTRAMUSCULAR | Status: DC | PRN
  Administered 2011-07-28 (×2): 25 ug via INTRAVENOUS

## 2011-07-28 MED ORDER — FENTANYL CITRATE 0.05 MG/ML IJ SOLN
INTRAMUSCULAR | Status: AC
  Filled 2011-07-28: qty 2

## 2011-07-28 MED ORDER — DIPHENHYDRAMINE HCL 50 MG/ML IJ SOLN
INTRAMUSCULAR | Status: AC
  Filled 2011-07-28: qty 1

## 2011-07-28 MED ORDER — DIPHENHYDRAMINE HCL 50 MG/ML IJ SOLN
INTRAMUSCULAR | Status: DC | PRN
  Administered 2011-07-28: 25 mg via INTRAVENOUS

## 2011-07-28 MED ORDER — PROMETHAZINE HCL 25 MG/ML IJ SOLN: 12.5000 mg | Freq: Four times a day (QID) | INTRAMUSCULAR | Status: DC | PRN

## 2011-07-28 MED ORDER — MIDAZOLAM HCL 10 MG/2ML IJ SOLN
INTRAMUSCULAR | Status: AC
  Filled 2011-07-28: qty 2

## 2011-07-28 MED ORDER — MIDAZOLAM HCL 10 MG/2ML IJ SOLN
INTRAMUSCULAR | Status: DC | PRN
  Administered 2011-07-28 (×2): 2 mg via INTRAVENOUS

## 2011-07-28 NOTE — Brief Op Note (Signed)
Please see EndoPro note dated 07/28/11

## 2011-07-28 NOTE — Op Note (Signed)
[facility] [address] [place], [place]  [address]  ENDOSCOPY PROCEDURE REPORT  PATIENT:  Lee, Sparks  MR#:  [id number] BIRTHDATE:  September 04, 1970, 40 yrs. old  GENDER:  male  ENDOSCOPIST:  Willis Modena, MD Referred by:          Manson Passey, MD [facility])  PROCEDURE DATE:  07/28/2011 PROCEDURE:  EGD, diagnostic 69629 ASA CLASS:  Class II INDICATIONS:  hematemesis  MEDICATIONS:   Cetacaine spray x 2, Fentanyl 50 mcg IV, Versed 4 mg IV, Benadryl 25 mg IV  DESCRIPTION OF PROCEDURE:   After the risks benefits and alternatives of the procedure were thoroughly explained, informed consent was obtained.  The Pentax Gastroscope [id number] endoscope was introduced through the mouth and advanced to the second portion of the duodenum, without limitations.  The instrument was slowly withdrawn as the mucosa was fully examined.  <<PROCEDUREIMAGES>>  FINDINGS:  Moderately long and moderately deep Mallory Weiss tear at [employer] junction with overlying eschar.  No active bleeding.  No esophageal or gastric varices.  Normal stomach, pylorus, and duodenum to the second portion.  No old or fresh blood identified.  ENDOSCOPIC IMPRESSION:    1.  Moderate Mallory-Weiss tear, non bleeding, but with overlying eschar.  Highly likely source of patient's hematemesis. 2.  Otherwise normal endoscopy; no evidence of gastroesophageal varices or ulcer disease seen.  RECOMMENDATIONS:      1.  Watch for potential complications of procedure. 2.  PPI 40 mg IV q 12 hours since further notice. 3.  Scheduled antiemetic therapy for at least the next 48 hours. 4.  Avoid ASA/NSAIDs until further notice. 5.  Clears today; will revisit tomorrow. 6.  I have discussed with primary team.  REPEAT EXAM:  No  ______________________________ Willis Modena  CC:  n. eSIGNEDWillis Modena at 07/28/2011 02:04 PM  [name], [phone]

## 2011-07-28 NOTE — Brief Op Note (Signed)
Please see EndoPro note dated 07/28/2011.  

## 2011-07-28 NOTE — Interval H&P Note (Signed)
History and Physical Interval Note:  07/28/2011 1:32 PM  Lee Sparks  has presented today for surgery, with the diagnosis of GI Bleed  The various methods of treatment have been discussed with the patient and family. After consideration of risks, benefits and other options for treatment, the patient has consented to  Procedure(s): ESOPHAGOGASTRODUODENOSCOPY (EGD) as a surgical intervention .  The patients' history has been reviewed, patient examined, no change in status, stable for surgery.  I have reviewed the patients' chart and labs.  Questions were answered to the patient's satisfaction.     Freddy Jaksch  Risks (bleeding, infection, bowel perforation that could require surgery, sedation-related changes in cardiopulmonary systems), benefits (identification and possible treatment of source of symptoms, exclusion of certain causes of symptoms), and alternatives (watchful waiting, radiographic imaging studies, empiric medical treatment) of upper endoscopy (EGD) were explained to patient/family in detail and patient wishes to proceed.

## 2011-07-28 NOTE — Progress Notes (Addendum)
Patient ID: Lee Sparks, male   DOB: 1971/04/23, 40 y.o.   MRN: [id number]  40 yo male with heavy alcohol use, positive for cocaine, vomited red blood multiple times.  Went to [facility] hosp[facility]at noon on 12/30, and was transferred to [facility] last night.  Has had no vomiting overnight, denies diarrhea or melena.  Subjective:  Complaining of LLQ cramping & requesting pain med.  No prior hx of vomiting blood.  Objective: Weight change:   Intake/Output Summary (Last 24 hours) at 07/28/11 1014 Last data filed at 07/28/11 0900  Gross per 24 hour  Intake      0 ml  Output      1 ml  Net     -1 ml   Blood pressure 127/91, pulse 86, temperature 98.2 F (36.8 C), temperature source Oral, resp. rate 18, height 5\' 8"  (1.727 m), weight 71.3 kg (157 lb 3 oz), SpO2 98.00%. Filed Vitals:   07/27/11 1700 07/27/11 1855 07/27/11 1956 07/28/11 0509  BP: 143/92 147/96 159/93 127/91  Pulse: 110 128 117 86  Temp:   98.1 F (36.7 C) 98.2 F (36.8 C)  TempSrc:   Oral Oral  Resp: 18 20 19 18   Height:   5\' 8"  (1.727 m)   Weight:   71.3 kg (157 lb 3 oz)   SpO2: 97% 100% 97% 98%    Physical Exam: General: No acute distress, mild agitation.  A&O x 3 Lungs: Clear to auscultation bilaterally without wheezes or crackles Cardiovascular: Regular rate and rhythm without murmur gallop or rub normal S1 and S2 Abdomen: Nontender, nondistended, soft, bowel sounds positive, no rebound, no ascites, no appreciable mass Extremities: No significant cyanosis, clubbing, or edema bilateral lower extremities  Basic Metabolic Panel:  Lab 07/28/11 1914 07/27/11 1439  NA 144 137  K 4.3 4.1  CL 105 94*  CO2 29 26  GLUCOSE 95 141*  BUN 15 18  CREATININE 0.67 0.56  CALCIUM 9.0 10.6*  MG -- --  PHOS -- --   Liver Function Tests:  Lab 07/28/11 0550 07/27/11 1439  AST 38* 72*  ALT 30 52  ALKPHOS 65 98  BILITOT 1.6* 2.0*  PROT 6.3 8.3  ALBUMIN 3.1* 4.3    Lab 07/27/11 1439  LIPASE 22  AMYLASE --   CBC:  Lab  07/28/11 0550 07/27/11 1439  WBC 8.3 15.7*  NEUTROABS -- 12.5*  HGB 12.9* 17.1*  HCT 39.7 50.8  MCV 91.7 91.0  PLT 69* 107*   Coagulation:  Lab 07/28/11 0550 07/27/11 1439  LABPROT 14.5 14.8  INR 1.11 1.14   Urine Drug Screen: Drugs of Abuse     Component Value Date/Time   LABOPIA NONE DETECTED 06/26/2007 1326   COCAINSCRNUR POSITIVE* 06/26/2007 1326   LABBENZ NONE DETECTED 06/26/2007 1326   AMPHETMU NONE DETECTED 06/26/2007 1326   THCU NONE DETECTED 06/26/2007 1326   LABBARB  Value: NONE DETECTED        DRUG SCREEN FOR MEDICAL PURPOSES ONLY.  IF CONFIRMATION IS NEEDED FOR ANY PURPOSE, NOTIFY LAB WITHIN 5 DAYS. 06/26/2007 1326     Studies/Results: Scheduled Meds:   . sodium chloride   Intravenous Once  . famotidine  20 mg Intravenous Once  . folic acid  1 mg Oral Daily  . LORazepam  0-4 mg Intravenous Q6H   Followed by  . LORazepam  0-4 mg Intravenous Q12H  . LORazepam  1 mg Intravenous Once  . ondansetron  4 mg Intravenous Once  . pantoprazole  40  mg Intravenous Once  . sodium chloride  1,000 mL Intravenous Once  . sodium chloride  1,000 mL Intravenous Once  . sodium chloride  1,000 mL Intravenous Once  . thiamine  100 mg Oral Daily   Or  . thiamine  100 mg Intravenous Daily   Continuous Infusions:   . 0.9 % NaCl with KCl 20 mEq / L 75 mL/hr at 07/27/11 2204  . pantoprozole (PROTONIX) infusion 8 mg/hr (07/27/11 2204)   PRN Meds:.HYDROmorphone, LORazepam, LORazepam  Assessment/Plan: Principal Problem:  *Upper GI bleeding Active Problems:  Cocaine abuse  Alcohol withdrawal syndrome  Abdominal pain  Tachycardia  Hypertension  1.  Upper GI bleeding.  No hematemesis over night.  Significant drop in hgb.  [facility] on for unassigned, consulted.  Patient NPO.  On protonix infusion.  Check cbc q 8.  2.  HTN - continue to monitor.  3.  Cocaine and Alcohol abuse - will consult social work.  On CIWA protocol for possible alcohol withdraw.  4.  Elevated LFTs.   Now beginning to normalize.  Likely secondary to alcohol/ cocaine use.  Will defer work up/ [place] to [employer].    LOS: 1 day   Stephani Police 07/28/2011, 10:14 AM [phone]  Addendum:   Dr. Dulce Sellar called me after the EGD procedure.  He found a relatively severe Mallory-Weiss tear.  He recommends clear liquids and scheduled antiemetics to ensure that the patient does not wretch / vomit.

## 2011-07-28 NOTE — Consult Note (Signed)
[facility] Gastroenterology Consultation Note  Referring Provider: Dr. Manson Passey  Reason for Consultation:  Hematemesis  HPI: Lee Sparks is a 40 y.o. male with history of heavy alcohol abuse who presented to [facility] and subsequently transferred to [facility] for evaluation of hematemesis.  Couple nights ago, had multiple episodes of hematemesis, which he describes as dilute red emesis.  Mild epigastric discomfort.  No NSAIDs.  No melena or hematochezia.  He has never had an endoscopy, and denies prior history of GI bleeding.   History reviewed. No pertinent past medical history.  Past Surgical History  Procedure Date  . Hernia repair   . Cystectomy     Prior to Admission medications   Medication Sig Start Date End Date Taking? Authorizing Provider  azithromycin (ZITHROMAX) 250 MG tablet Take first 2 tablets today, then 1 every day until finished. 07/17/11  Yes Carleene Cooper III, MD  diphenhydrAMINE (BENADRYL) 25 MG tablet Take 2 capsule every six hours if needed for allergic redness and itching.  Buy over-the-counter. 07/17/11  Yes Carleene Cooper III, MD  ranitidine (ZANTAC) 150 MG tablet Take 150 mg by mouth daily.     Yes Historical Provider, MD    Current Facility-Administered Medications  Medication Dose Route Frequency Provider Last Rate Last Dose  . 0.9 %  sodium chloride infusion   Intravenous Once Toy Baker, MD 125 mL/hr at 07/27/11 1649    . 0.9 % NaCl with KCl 20 mEq/ L  infusion   Intravenous Continuous Ava Swayze, DO 75 mL/hr at 07/28/11 1113    . famotidine (PEPCID) IVPB 20 mg  20 mg Intravenous Once [employer]. Colon Branch, MD   20 mg at 07/27/11 1720  . folic acid (FOLVITE) tablet 1 mg  1 mg Oral Daily Ava Swayze, DO   1 mg at 07/28/11 1025  . HYDROmorphone (DILAUDID) injection 0.5 mg  0.5 mg Intravenous Q3H PRN Ava Swayze, DO   0.5 mg at 07/28/11 1025  . LORazepam (ATIVAN) injection 0-4 mg  0-4 mg Intravenous Q6H Ava Swayze, DO       Followed by  .  LORazepam (ATIVAN) injection 0-4 mg  0-4 mg Intravenous Q12H Ava Swayze, DO      . LORazepam (ATIVAN) injection 1 mg  1 mg Intravenous Once [employer]. Colon Branch, MD   1 mg at 07/27/11 1720  . LORazepam (ATIVAN) tablet 1 mg  1 mg Oral Q6H PRN Ava Swayze, DO       Or  . LORazepam (ATIVAN) injection 1 mg  1 mg Intravenous Q6H PRN Ava Swayze, DO      . ondansetron (ZOFRAN) injection 4 mg  4 mg Intravenous Once Toy Baker, MD   4 mg at 07/27/11 1424  . pantoprazole (PROTONIX) 80 mg in sodium chloride 0.9 % 250 mL infusion  8 mg/hr Intravenous Continuous Ava Swayze, DO 25 mL/hr at 07/27/11 2204 8 mg/hr at 07/27/11 2204  . pantoprazole (PROTONIX) injection 40 mg  40 mg Intravenous Once Toy Baker, MD   40 mg at 07/27/11 1424  . sodium chloride 0.9 % bolus 1,000 mL  1,000 mL Intravenous Once Toy Baker, MD   1,000 mL at 07/27/11 1424  . sodium chloride 0.9 % bolus 1,000 mL  1,000 mL Intravenous Once [employer]. Colon Branch, MD   1,000 mL at 07/27/11 1650  . sodium chloride 0.9 % bolus 1,000 mL  1,000 mL Intravenous Once [employer]. Colon Branch, MD   1,000 mL at  07/27/11 1857  . thiamine (VITAMIN B-1) tablet 100 mg  100 mg Oral Daily Ava Swayze, DO   100 mg at 07/28/11 1026   Or  . thiamine (B-1) injection 100 mg  100 mg Intravenous Daily Ava Swayze, DO        Allergies as of 07/27/2011 - Review Complete 07/27/2011  Allergen Reaction Noted  . Grapefruit diet or (extra strength grapefruit) Hives 07/17/2011    Family History  Problem Relation Age of Onset  . Cancer Other     History   Social History  . Marital Status: Single    Spouse Name: N/A    Number of Children: N/A  . Years of Education: N/A   Occupational History  . Not on file.   Social History Main Topics  . Smoking status: Former Smoker -- 0.0 packs/day for 15 years    Types: Cigarettes    Quit date: 06/27/2011  . Smokeless tobacco: Never Used  . Alcohol Use: 30.0 oz/week    50 Cans of beer per week     Equivalent of two 40 oz  beers daily.  . Drug Use: No  . Sexually Active: Yes   Other Topics Concern  . Not on file   Social History Narrative  . No narrative on file    Review of Systems: Positive for: , anorexia, fatigue, weakness, malaise Gen: Denies any fever, chills, rigors, night sweats, involuntary weight loss, and sleep disorder CV: Denies chest pain, angina, palpitations, syncope, orthopnea, PND, peripheral edema, and claudication. Resp: Denies dyspnea, cough, sputum, wheezing, coughing up blood. GI: Described in detail in HPI.    GU : Denies urinary burning, blood in urine, urinary frequency, urinary hesitancy, nocturnal urination, and urinary incontinence. MS: Denies joint pain or swelling.  Denies muscle weakness, cramps, atrophy.  Derm: Denies rash, itching, oral ulcerations, hives, unhealing ulcers.  Psych: Denies depression, anxiety, memory loss, suicidal ideation, hallucinations,  and confusion. Heme: Denies bruising, bleeding, and enlarged lymph nodes. Neuro:  Denies any headaches, dizziness, paresthesias. Endo:  Denies any problems with DM, thyroid, adrenal function.  Physical Exam: Vital signs in last 24 hours: Temp:  [98.1 F (36.7 C)-98.2 F (36.8 C)] 98.2 F (36.8 C) (12/31 0509) Pulse Rate:  [86-131] 86  (12/31 0509) Resp:  [18-20] 18  (12/31 0509) BP: (127-159)/(91-116) 127/91 mmHg (12/31 0509) SpO2:  [97 %-100 %] 98 % (12/31 0509) Weight:  [71.3 kg (157 lb 3 oz)-74.844 kg (165 lb)] 157 lb 3 oz (71.3 kg) (12/30 1956) Last BM Date: 07/27/11 General:   Alert,  Little bit tremulous, not toxic-appearing and is in NAD Head:  Normocephalic and atraumatic. Eyes:  Sclera clear, no icterus.   Conjunctiva pink. Ears:  Normal auditory acuity. Nose:  No deformity, discharge,  or lesions. Mouth:  No deformity or lesions.  Oropharynx pink & moist. Neck:  Supple; no masses or thyromegaly. Lungs:  Clear throughout to auscultation.   No wheezes, crackles, or rhonchi. No acute  distress. Heart:  Regular rate and rhythm; no murmurs, clicks, rubs,  or gallops. Abdomen:  Soft, nontender and nondistended. No masses, hepatosplenomegaly or hernias noted. Normal bowel sounds, without guarding, and without rebound.     Msk:  Symmetrical without gross deformities. Normal posture. Pulses:  Normal pulses noted. Extremities:  Without clubbing or edema. Neurologic:  Alert and  oriented x4;  grossly normal neurologically.  Some generalized jitteriness.  No overt signs of withdrawal. Skin:  Intact without significant lesions or rashes. Psych:  Alert and  cooperative. Normal mood and affect.   Lab Results:  Basename 07/28/11 1044 07/28/11 0550 07/27/11 1439  WBC 7.7 8.3 15.7*  HGB 13.0 12.9* 17.1*  HCT 39.2 39.7 50.8  PLT 56* 69* 107*   BMET  Basename 07/28/11 0550 07/27/11 1439  NA 144 137  K 4.3 4.1  CL 105 94*  CO2 29 26  GLUCOSE 95 141*  BUN 15 18  CREATININE 0.67 0.56  CALCIUM 9.0 10.6*   LFT  Basename 07/28/11 0550  PROT 6.3  ALBUMIN 3.1*  AST 38*  ALT 30  ALKPHOS 65  BILITOT 1.6*  BILIDIR --  IBILI --   PT/INR  Basename 07/28/11 0550 07/27/11 1439  LABPROT 14.5 14.8  INR 1.11 1.14    Studies/Results: No results found.  Impression:  1.  Hematemesis.  Suspect Mallory Weiss tear.  Can't rule out ulcer.  Variceal bleed possible but seems less likely.   2.  Thrombocytopenia.  Raises specter for possible portal hypertension. 3.  Elevated LFTs.  Suspect alcoholic hepatitis.  Plan:  1.  NPO. 2.  PPI. 3.  EGD today. 4.  Risks (bleeding, infection, bowel perforation that could require surgery, sedation-related changes in cardiopulmonary systems), benefits (identification and possible treatment of source of symptoms, exclusion of certain causes of symptoms), and alternatives (watchful waiting, radiographic imaging studies, empiric medical treatment) of upper endoscopy (EGD) were explained to patient/family in detail and patient wishes to  proceed.   LOS: 1 day   Clare Fennimore M  07/28/2011, 12:19 PM

## 2011-07-29 DIAGNOSIS — D696 Thrombocytopenia, unspecified: Secondary | ICD-10-CM | POA: Diagnosis present

## 2011-07-29 LAB — GLUCOSE, CAPILLARY
Glucose-Capillary: 96 mg/dL (ref 70–99)
Glucose-Capillary: 96 mg/dL (ref 70–99)
Glucose-Capillary: 116 mg/dL — ABNORMAL HIGH (ref 70–99)

## 2011-07-29 LAB — CBC
Hemoglobin: 11.8 g/dL — ABNORMAL LOW (ref 13.0–17.0)
Hemoglobin: 12.3 g/dL — ABNORMAL LOW (ref 13.0–17.0)
Hemoglobin: 12.4 g/dL — ABNORMAL LOW (ref 13.0–17.0)
MCH: 30.1 pg (ref 26.0–34.0)
MCHC: 32.3 g/dL (ref 30.0–36.0)
RBC: 3.94 MIL/uL — ABNORMAL LOW (ref 4.22–5.81)
RBC: 4.09 MIL/uL — ABNORMAL LOW (ref 4.22–5.81)
RBC: 4.08 MIL/uL — ABNORMAL LOW (ref 4.22–5.81)
WBC: 6.3 10*3/uL (ref 4.0–10.5)
WBC: 8.1 10*3/uL (ref 4.0–10.5)

## 2011-07-29 LAB — BASIC METABOLIC PANEL
GFR calc non Af Amer: >90 mL/min (ref >90)
Glucose, Bld: 86 mg/dL (ref 70–99)
Potassium: 4.7 mEq/L (ref 3.5–5.1)
Sodium: 140 mEq/L (ref 135–145)

## 2011-07-29 MED ORDER — PANTOPRAZOLE SODIUM 40 MG IV SOLR
40.0000 mg | Freq: Two times a day (BID) | INTRAVENOUS | Status: DC
  Administered 2011-07-29 – 2011-07-30 (×3): 40 mg via INTRAVENOUS
  Filled 2011-07-29 (×5): qty 40

## 2011-07-29 NOTE — Progress Notes (Signed)
Patient ID: Lee Sparks, male   DOB: July 28, 1971, 41 y.o.   MRN: [id number] Subjective: No events overnight. Patient denies chest pain, shortness of breath, abdominal pain.   Objective:  Vital signs in last 24 hours:  Filed Vitals:   07/29/11 0610 07/29/11 0915 07/29/11 1501 07/29/11 1515  BP: 114/74  118/74 118/74  Pulse: 88 87 88 88  Temp: 98.1 F (36.7 C)  98.6 F (37 C)   TempSrc: Oral     Resp: 18  18   Height:      Weight:      SpO2: 98%  96%     Intake/Output from previous day:   Intake/Output Summary (Last 24 hours) at 07/29/11 1607 Last data filed at 07/29/11 1500  Gross per 24 hour  Intake 865.83 ml  Output    400 ml  Net 465.83 ml    Physical Exam: General: Alert, awake, oriented x3, in no acute distress. HEENT: No bruits, no goiter. Moist mucous membranes, no scleral icterus, no conjunctival pallor. Heart: Regular rate and rhythm, S1/S2 +, no murmurs, rubs, gallops. Lungs: Clear to auscultation bilaterally. No wheezing, no rhonchi, no rales.  Abdomen: Soft, nontender, nondistended, positive bowel sounds. Extremities: No clubbing or cyanosis, no pitting edema,  positive pedal pulses. Neuro: Grossly nonfocal.  Lab Results:  Basic Metabolic Panel:    Component Value Date/Time   NA 140 07/29/2011 0346   K 4.7 07/29/2011 0346   CL 105 07/29/2011 0346   CO2 29 07/29/2011 0346   BUN 11 07/29/2011 0346   CREATININE 0.66 07/29/2011 0346   GLUCOSE 86 07/29/2011 0346   CALCIUM 9.1 07/29/2011 0346   CBC:    Component Value Date/Time   WBC 6.3 07/29/2011 1217   HGB 12.3* 07/29/2011 1217   HCT 37.9* 07/29/2011 1217   PLT 55* 07/29/2011 1217   MCV 92.7 07/29/2011 1217   NEUTROABS 12.5* 07/27/2011 1439   LYMPHSABS 1.5 07/27/2011 1439   MONOABS 1.6* 07/27/2011 1439   EOSABS 0.0 07/27/2011 1439   BASOSABS 0.1 07/27/2011 1439      Lab 07/29/11 1217 07/29/11 0214 07/28/11 1756 07/28/11 1044 07/28/11 0550 07/27/11 1439  WBC 6.3 6.3 8.8 7.7 8.3 --  HGB 12.3* 11.8* 13.6 13.0  12.9* --  HCT 37.9* 36.5* 40.5 39.2 39.7 --  PLT 55* 53* 65* 56* 69* --  MCV 92.7 92.6 92.3 91.6 91.7 --  MCH 30.1 29.9 31.0 30.4 29.8 --  MCHC 32.5 32.3 33.6 33.2 32.5 --  RDW 13.3 13.4 13.5 13.7 13.6 --  LYMPHSABS -- -- -- -- -- 1.5  MONOABS -- -- -- -- -- 1.6*  EOSABS -- -- -- -- -- 0.0  BASOSABS -- -- -- -- -- 0.1  BANDABS -- -- -- -- -- --    Lab 07/29/11 0346 07/28/11 0550 07/27/11 1439  NA 140 144 137  K 4.7 4.3 4.1  CL 105 105 94*  CO2 29 29 26   GLUCOSE 86 95 141*  BUN 11 15 18   CREATININE 0.66 0.67 0.56  CALCIUM 9.1 9.0 10.6*  MG -- -- --    Lab 07/28/11 0550 07/27/11 1439  INR 1.11 1.14  PROTIME -- --   Cardiac markers: No results found for this basename: CK:3,CKMB:3,TROPONINI:3,MYOGLOBIN:3 in the last 168 hours No components found with this basename: POCBNP:3 No results found for this or any previous visit (from the past 240 hour(s)).  Studies/Results: No results found.  Medications: Scheduled Meds:   . folic acid  1 mg  Oral Daily  . LORazepam  0-4 mg Intravenous Q6H   Followed by  . LORazepam  0-4 mg Intravenous Q12H  . ondansetron  4 mg Intravenous TID AC & HS  . pantoprazole (PROTONIX) IV  40 mg Intravenous Q12H  . thiamine  100 mg Oral Daily   Or  . thiamine  100 mg Intravenous Daily   Continuous Infusions:   . 0.9 % NaCl with KCl 20 mEq / L 75 mL/hr at 07/29/11 0408  . DISCONTD: pantoprozole (PROTONIX) infusion 8 mg/hr (07/27/11 2204)   PRN Meds:.HYDROmorphone, LORazepam, LORazepam, promethazine  Assessment/Plan:  Principal Problem:   *Upper GI bleeding - resolved at present; patient reports no vomiting since the admission - GI evaluated the patient; status post EGD with MWT findings but no active bleed - Protonix 40 mg BID - we will reassess patient in am and possible D/C in am  Active Problems:   Thrombocytopenia - likely secondary to alcohol abuse although significantly decreased even since the admission - no signs of active  bleed - we will use SCD instead of lovenox or heparin subQ for DVT prophylaxis - continue to monitor   EDUCATION - test results and diagnostic studies were discussed with patient  at the bedside - patient has verbalized the understanding - questions were answered at the bedside and contact information was provided for additional questions or concerns   LOS: 2 days   Sven Pinheiro 07/29/2011, 4:07 PM  [facility] Pager: [phone]

## 2011-07-29 NOTE — Progress Notes (Signed)
Subjective: No further hematemesis No melena/hematochezia. Still bit nauseous.  Objective: Vital signs in last 24 hours: Temp:  [97.5 F (36.4 C)-98.6 F (37 C)] 98.1 F (36.7 C) (01/01 0610) Pulse Rate:  [87-92] 87  (01/01 0915) Resp:  [18-25] 18  (01/01 0610) BP: (114-162)/(64-106) 114/74 mmHg (01/01 0610) SpO2:  [93 %-100 %] 98 % (01/01 0610) Weight change:  Last BM Date: 07/27/11  PE: GEN:  NAD. ABD:  Soft, mild epigastric tenderness  Lab Results: Hgb 11.8 Plts 53  Assessment:  1.  Hematemesis due to Mallory-Weiss tear.  Trivial downtrend in hgb.  No further overt bleeding. 2.  Nausea.  Multifactorial (MWT, ? Incipient alcohol withdrawal). 3.  Thrombocytopenia.  Likely alcohol-related bone marrow suppression.  Could potentially also be medication-related. Portal hypertension could cause this as well, though patient is bit younger than we typically see for alcohol-related cirrhosis.  Also, endoscopy showed no portal gastropathy or varices.  Plan:  1.  Scheduled antiemetics for the next couple days.  In order to minimize risk of further troubles from his Mallory-Weiss tear, it is very important that we minimize any further nausea or retching. 2.  Advance diet gingerly as tolerated. 3.  Avoid NSAIDs for the forseeable future. 4.  Protonix 40 mg bid for the next 6 weeks. 5.  Will sign off.  Patient can follow-up with [place] [facility]) on as-needed basis. 6.  Thank you for consultation.  Please call us back with any questions.   Lee Sparks 07/29/2011, 12:52 PM

## 2011-07-30 ENCOUNTER — Encounter (HOSPITAL_COMMUNITY): Payer: Self-pay | Admitting: Gastroenterology

## 2011-07-30 LAB — CBC
HCT: 35.6 % — ABNORMAL LOW (ref 39.0–52.0)
Hemoglobin: 11.8 g/dL — ABNORMAL LOW (ref 13.0–17.0)
RBC: 3.91 MIL/uL — ABNORMAL LOW (ref 4.22–5.81)
WBC: 5.3 10*3/uL (ref 4.0–10.5)

## 2011-07-30 MED ORDER — SODIUM CHLORIDE 0.9 % IV SOLN
INTRAVENOUS | Status: AC
  Administered 2011-07-30 – 2011-07-31 (×2): via INTRAVENOUS

## 2011-07-30 MED ORDER — ONDANSETRON HCL 4 MG PO TABS
4.0000 mg | ORAL_TABLET | Freq: Three times a day (TID) | ORAL | Status: DC
  Administered 2011-07-30 – 2011-08-01 (×6): 4 mg via ORAL
  Filled 2011-07-30 (×8): qty 1

## 2011-07-30 MED ORDER — CHLORDIAZEPOXIDE HCL 10 MG PO CAPS
10.0000 mg | ORAL_CAPSULE | Freq: Three times a day (TID) | ORAL | Status: DC
  Administered 2011-07-30 – 2011-07-31 (×3): 10 mg via ORAL
  Filled 2011-07-30 (×3): qty 1

## 2011-07-30 MED ORDER — PANTOPRAZOLE SODIUM 40 MG PO TBEC
40.0000 mg | DELAYED_RELEASE_TABLET | Freq: Two times a day (BID) | ORAL | Status: DC
  Administered 2011-07-30 – 2011-08-01 (×4): 40 mg via ORAL
  Filled 2011-07-30 (×4): qty 1

## 2011-07-30 NOTE — Progress Notes (Signed)
Patient ID: Lee Sparks, male   DOB: 1971/01/21, 41 y.o.   MRN: [id number] Patient ID: Lee Sparks, male   DOB: Jul 08, 1971, 41 y.o.   MRN: [id number] Subjective: Complaining of Nausea and abdominal pain with some pain radiating up the path of his esophagus.  Objective:  Vital signs in last 24 hours:  Filed Vitals:   07/29/11 1501 07/29/11 1515 07/29/11 2258 07/30/11 0516  BP: 118/74 118/74 123/85 112/72  Pulse: 88 88 100 88  Temp: 98.6 F (37 C)  98.9 F (37.2 C) 98.4 F (36.9 C)  TempSrc:   Oral Oral  Resp: 18  20 18   Height:      Weight:      SpO2: 96%  100% 95%    Intake/Output from previous day:   Intake/Output Summary (Last 24 hours) at 07/30/11 1406 Last data filed at 07/30/11 0600  Gross per 24 hour  Intake   1260 ml  Output   2150 ml  Net   -890 ml    Physical Exam: General: Alert, awake, oriented x3, in no acute distress. HEENT: No bruits, no goiter. Moist mucous membranes, no scleral icterus, no conjunctival pallor. Heart: Regular rate and rhythm, S1/S2 +, no murmurs, rubs, gallops. Lungs: Clear to auscultation bilaterally. No wheezing, no rhonchi, no rales.  Abdomen: Soft, nontender, nondistended, positive bowel sounds. Extremities: No clubbing or cyanosis, no pitting edema,  positive pedal pulses. Neuro: Grossly nonfocal.  Lab Results:  Basic Metabolic Panel:    Component Value Date/Time   NA 140 07/29/2011 0346   K 4.7 07/29/2011 0346   CL 105 07/29/2011 0346   CO2 29 07/29/2011 0346   BUN 11 07/29/2011 0346   CREATININE 0.66 07/29/2011 0346   GLUCOSE 86 07/29/2011 0346   CALCIUM 9.1 07/29/2011 0346   CBC:    Component Value Date/Time   WBC 5.3 07/30/2011 0925   HGB 11.8* 07/30/2011 0925   HCT 35.6* 07/30/2011 0925   PLT 52* 07/30/2011 0925   MCV 91.0 07/30/2011 0925   NEUTROABS 12.5* 07/27/2011 1439   LYMPHSABS 1.5 07/27/2011 1439   MONOABS 1.6* 07/27/2011 1439   EOSABS 0.0 07/27/2011 1439   BASOSABS 0.1 07/27/2011 1439      Lab 07/30/11 0925  07/29/11 2224 07/29/11 1217 07/29/11 0214 07/28/11 1756 07/27/11 1439  WBC 5.3 8.1 6.3 6.3 8.8 --  HGB 11.8* 12.4* 12.3* 11.8* 13.6 --  HCT 35.6* 37.3* 37.9* 36.5* 40.5 --  PLT 52* 57* 55* 53* 65* --  MCV 91.0 91.4 92.7 92.6 92.3 --  MCH 30.2 30.4 30.1 29.9 31.0 --  MCHC 33.1 33.2 32.5 32.3 33.6 --  RDW 13.1 13.1 13.3 13.4 13.5 --  LYMPHSABS -- -- -- -- -- 1.5  MONOABS -- -- -- -- -- 1.6*  EOSABS -- -- -- -- -- 0.0  BASOSABS -- -- -- -- -- 0.1  BANDABS -- -- -- -- -- --    Lab 07/29/11 0346 07/28/11 0550 07/27/11 1439  NA 140 144 137  K 4.7 4.3 4.1  CL 105 105 94*  CO2 29 29 26   GLUCOSE 86 95 141*  BUN 11 15 18   CREATININE 0.66 0.67 0.56  CALCIUM 9.1 9.0 10.6*  MG -- -- --    Lab 07/28/11 0550 07/27/11 1439  INR 1.11 1.14  PROTIME -- --   Studies/Results: No results found.  Medications: Scheduled Meds:    . folic acid  1 mg Oral Daily  . LORazepam  0-4 mg Intravenous Q6H  Followed by  . LORazepam  0-4 mg Intravenous Q12H  . ondansetron  4 mg Oral TID AC  . pantoprazole  40 mg Oral BID AC  . thiamine  100 mg Oral Daily   Or  . thiamine  100 mg Intravenous Daily  . DISCONTD: ondansetron  4 mg Intravenous TID AC & HS  . DISCONTD: pantoprazole (PROTONIX) IV  40 mg Intravenous Q12H   Continuous Infusions:    . DISCONTD: 0.9 % NaCl with KCl 20 mEq / L 75 mL/hr at 07/29/11 2327   PRN Meds:.LORazepam, LORazepam, promethazine, DISCONTD: HYDROmorphone  Assessment/Plan:  Principal Problem:   *Upper GI bleeding - resolved at present; patient reports no vomiting since the admission, but is having black tar stools (expected) - GI evaluated the patient; status post EGD with MWT findings but no active bleed.  GI signed off.  Rec:  6 wks protonix bid and antiemetics as necessary to prevent wretching. - we will reassess patient in am and possible D/C in am  Active Problems:   Thrombocytopenia - likely secondary to alcohol abuse although significantly decreased  even since the admission - no signs of active bleed - we will use SCD instead of lovenox or heparin subQ for DVT prophylaxis - continue to monitor   EDUCATION - test results and diagnostic studies were discussed with patient  at the bedside - patient has verbalized the understanding - questions were answered at the bedside and contact information was provided for additional questions or concerns   LOS: 3 days   Stephani Police, PA-C 07/30/2011, 2:06 PM [phone]

## 2011-07-30 NOTE — Progress Notes (Signed)
Pt reports one tarry black stool overnight. RN did not observe.

## 2011-07-30 NOTE — Progress Notes (Signed)
I have directly reviewed the clinical findings, lab, imaging studies and management of this patient in detail. I have interviewed and examined the pt and agree with the documentation,  as recorded by NP.  Agree with PPI for MWT on EGD, H&H stable, add librium for mild DTs continue CIWA protocol, IVF gentle as BP slightly low. Patient counseled, for alcohol-drug use.  Leroy Sea M.D on 07/30/2011 at 2:39 PM  [facility] Office  [phone]

## 2011-07-31 LAB — CBC
Hemoglobin: 11.4 g/dL — ABNORMAL LOW (ref 13.0–17.0)
MCH: 30.5 pg (ref 26.0–34.0)
MCV: 91.7 fL (ref 78.0–100.0)
Platelets: 56 10*3/uL — ABNORMAL LOW (ref 150–400)
RBC: 3.74 MIL/uL — ABNORMAL LOW (ref 4.22–5.81)

## 2011-07-31 LAB — BASIC METABOLIC PANEL
BUN: 5 mg/dL — ABNORMAL LOW (ref 6–23)
GFR calc Af Amer: >90 mL/min (ref >90)
GFR calc non Af Amer: >90 mL/min (ref >90)
Potassium: 3.9 mEq/L (ref 3.5–5.1)
Sodium: 142 mEq/L (ref 135–145)

## 2011-07-31 MED ORDER — CHLORDIAZEPOXIDE HCL 5 MG PO CAPS
5.0000 mg | ORAL_CAPSULE | Freq: Two times a day (BID) | ORAL | Status: DC
  Administered 2011-07-31 – 2011-08-01 (×2): 5 mg via ORAL
  Filled 2011-07-31 (×2): qty 1

## 2011-07-31 NOTE — Progress Notes (Signed)
Clinical social worker aware of patient current substance abuse, will assess further tomorrow.   Catha Gosselin, LCSWA  [phone] .07/31/2011 17:20pm

## 2011-07-31 NOTE — Progress Notes (Signed)
Lee Sparks [id number] is a 41 y.o. male,  Outpatient Primary MD for the patient is No primary provider on file.  Chief Complaint  Patient presents with  . Hemoptysis  . Emesis  . Chest Pain  . Weakness        Subjective:   Lee Sparks today has, No headache, No chest pain, No abdominal pain - No Nausea, No new weakness tingling or numbness, No Cough - SOB.   Objective:   Filed Vitals:   07/30/11 1409 07/30/11 2016 07/31/11 0438 07/31/11 1337  BP: 98/58 119/73 129/79 148/72  Pulse: 102 95 80 82  Temp: 98.4 F (36.9 C) 98.5 F (36.9 C) 98.4 F (36.9 C) 98.2 F (36.8 C)  TempSrc: Oral Oral Oral Oral  Resp: 18 20 20 20   Height:      Weight:      SpO2: 98% 93% 96% 99%    Wt Readings from Last 3 Encounters:  07/27/11 71.3 kg (157 lb 3 oz)  07/27/11 71.3 kg (157 lb 3 oz)  07/17/11 75.751 kg (167 lb)     Intake/Output Summary (Last 24 hours) at 07/31/11 1401 Last data filed at 07/31/11 1337  Gross per 24 hour  Intake    360 ml  Output   1450 ml  Net  -1090 ml    Exam Awake Alert, Oriented *3, No new F.N deficits, Normal affect [place].AT,PERRAL Supple Neck,No JVD, No cervical lymphadenopathy appriciated.  Symmetrical Chest wall movement, Good air movement bilaterally, CTAB RRR,No Gallops,Rubs or new Murmurs, No Parasternal Heave +ve B.Sounds, Abd Soft, Non tender, No organomegaly appriciated, No rebound -guarding or rigidity. No Cyanosis, Clubbing or edema, No new Rash or bruise.  Data Review  CBC  Lab 07/31/11 0639 07/30/11 0925 07/29/11 2224 07/29/11 1217 07/29/11 0214 07/27/11 1439  WBC 5.5 5.3 8.1 6.3 6.3 --  HGB 11.4* 11.8* 12.4* 12.3* 11.8* --  HCT 34.3* 35.6* 37.3* 37.9* 36.5* --  PLT 56* 52* 57* 55* 53* --  MCV 91.7 91.0 91.4 92.7 92.6 --  MCH 30.5 30.2 30.4 30.1 29.9 --  MCHC 33.2 33.1 33.2 32.5 32.3 --  RDW 13.1 13.1 13.1 13.3 13.4 --  LYMPHSABS -- -- -- -- -- 1.5  MONOABS -- -- -- -- -- 1.6*  EOSABS -- -- -- -- -- 0.0    BASOSABS -- -- -- -- -- 0.1  BANDABS -- -- -- -- -- --    Chemistries   Lab 07/31/11 0639 07/29/11 0346 07/28/11 0550 07/27/11 1439  NA 142 140 144 137  K 3.9 4.7 4.3 4.1  CL 106 105 105 94*  CO2 27 29 29 26   GLUCOSE 101* 86 95 141*  BUN 5* 11 15 18   CREATININE 0.67 0.66 0.67 0.56  CALCIUM 9.2 9.1 9.0 10.6*  MG -- -- -- --  AST -- -- 38* 72*  ALT -- -- 30 52  ALKPHOS -- -- 65 98  BILITOT -- -- 1.6* 2.0*   ------------------------------------------------------------------------------------------------------------------ estimated creatinine clearance is 119.9 ml/min (by C-G formula based on Cr of 0.67). ------------------------------------------------------------------------------------------------------------------ No results found for this basename: HGBA1C:2 in the last 72 hours ------------------------------------------------------------------------------------------------------------------ No results found for this basename: CHOL:2,HDL:2,LDLCALC:2,TRIG:2,CHOLHDL:2,LDLDIRECT:2 in the last 72 hours ------------------------------------------------------------------------------------------------------------------ No results found for this basename: TSH,T4TOTAL,FREET3,T3FREE,THYROIDAB in the last 72 hours ------------------------------------------------------------------------------------------------------------------ No results found for this basename: VITAMINB12:2,FOLATE:2,FERRITIN:2,TIBC:2,IRON:2,RETICCTPCT:2 in the last 72 hours  Coagulation profile  Lab 07/28/11 0550 07/27/11 1439  INR 1.11 1.14  PROTIME -- --    No results found for  this basename: DDIMER:2 in the last 72 hours  Cardiac Enzymes No results found for this basename: CK:3,CKMB:3,TROPONINI:3,MYOGLOBIN:3 in the last 168 hours ------------------------------------------------------------------------------------------------------------------ No components found with this basename: POCBNP:3  Micro Results No  results found for this or any previous visit (from the past 240 hour(s)).  Radiology Reports Dg Chest 2 View  07/17/2011  *RADIOLOGY REPORT*  Clinical Data: Possible opacity on the portable view.  CHEST - 2 VIEW  Comparison: 07/17/2011  Findings: Questionable lesion on portable view corresponds to callus formation along the posterolateral 6th rib on the right, suggesting remote injury. No lung or acute osseous abnormality identified.  Cardiomediastinal contours within normal limits.  IMPRESSION: No acute cardiopulmonary process.  Original Report Authenticated By: Waneta Martins, M.D.   Dg Chest Portable 1 View  07/17/2011  *RADIOLOGY REPORT*  Clinical Data: Chest pain  PORTABLE CHEST - 1 VIEW  Comparison: 12/20/2003  Findings: Nodular opacity projecting over the right scapula. Lungs are otherwise clear. No pleural effusion or pneumothorax. The cardiomediastinal contours are within normal limits. The visualized bones and soft tissues are without significant appreciable abnormality.  IMPRESSION: Nodular opacity projecting over the right scapula. May represent superimposed shadows.  Recommend PA and lateral radiographs when the patient can tolerate to better characterize.  Original Report Authenticated By: Waneta Martins, M.D.    Scheduled Meds:   . chlordiazePOXIDE  10 mg Oral TID  . folic acid  1 mg Oral Daily  . LORazepam  0-4 mg Intravenous Q12H  . ondansetron  4 mg Oral TID AC  . pantoprazole  40 mg Oral BID AC  . thiamine  100 mg Oral Daily   Or  . thiamine  100 mg Intravenous Daily  . DISCONTD: ondansetron  4 mg Intravenous TID AC & HS  . DISCONTD: pantoprazole (PROTONIX) IV  40 mg Intravenous Q12H   Continuous Infusions:   . sodium chloride 100 mL/hr at 07/31/11 0146  . DISCONTD: 0.9 % NaCl with KCl 20 mEq / L 75 mL/hr at 07/29/11 2327   PRN Meds:.LORazepam, LORazepam, promethazine, DISCONTD: HYDROmorphone  Assessment & Plan    Upper GI bleeding  - resolved at  present; patient reports no vomiting since the admission, but is having black tar stools (expected)  - GI evaluated the patient; status post EGD with MWT findings but no active bleed. GI signed off. Rec: 6 wks protonix bid and antiemetics as necessary to prevent wretching.     Thrombocytopenia  - likely secondary to alcohol abuse although significantly decreased even since the admission  - no signs of active bleed now. Oupt follow   Alcohol Abuse with DTs  Improved on librium, likely DC in am if remains stable.   DVT Prophylaxis  SCDs  See all Orders from today for further details    Leroy Sea M.D on 07/31/2011 at 2:01 PM  [facility] Office  [phone]

## 2011-08-01 MED ORDER — THIAMINE HCL 100 MG PO TABS: 100.0000 mg | ORAL_TABLET | Freq: Every day | ORAL | Status: AC

## 2011-08-01 MED ORDER — CHLORDIAZEPOXIDE HCL 5 MG PO CAPS: 5.0000 mg | ORAL_CAPSULE | Freq: Every day | ORAL | Status: AC

## 2011-08-01 MED ORDER — FOLIC ACID 1 MG PO TABS: 1.0000 mg | ORAL_TABLET | Freq: Every day | ORAL | Status: AC

## 2011-08-01 MED ORDER — PANTOPRAZOLE SODIUM 40 MG PO TBEC: 40.0000 mg | DELAYED_RELEASE_TABLET | Freq: Two times a day (BID) | ORAL | Status: DC

## 2011-08-01 NOTE — Discharge Summary (Signed)
Lee Sparks, 41 y.o., DOB [birth year], MRN [id number]. Admission date: 07/27/2011 Discharge Date 08/01/2011 Primary MD No primary provider on file. Admitting Physician Ava Swayze, DO  Admission Diagnosis  Upper GI bleeding [578.9] Spitting up blood GI Bleed  Discharge Diagnosis   Principal Problem:  *Upper GI bleeding Active Problems:  Tachycardia  Hypertension  Alcohol withdrawal syndrome  Abdominal pain  Thrombocytopenia    Past Medical History  Diagnosis Date  . Anemia     Past Surgical History  Procedure Date  . Hernia repair   . Cystectomy   . Esophagogastroduodenoscopy 07/28/2011    Procedure: ESOPHAGOGASTRODUODENOSCOPY (EGD);  Surgeon: Freddy Jaksch, MD;  [facility] END[facility];  Service: Endoscopy;  Laterality: N/A;     Hospital Course See H&P, Labs, Consult and Test reports for all details in brief, patient was admitted for   Upper GI bleeding due to Alcohol related MWT seen by Dr Dulce Sellar- GI, symptoms  resolved at present;  patient reports no vomiting since the admission, status post EGD with MWT findings but no active bleed. GI signed off. Rec: 6 wks protonix bid and antiemetics as necessary to prevent wretching.    Thrombocytopenia  - likely secondary to alcohol abuse + dilutional in hospital post IVF, stable clinically  no signs of active bleed now. Oupt follow , counseled against physically demanding activity-contact sports .   Alcohol Abuse with DTs  DTs resolved, no Ativan need, counseled, refused any help at this time, counseled not to drive till seen by PCP and cleared, will DC on PPI, Vitamins, Librium low dose X 3 days.    Consults  GI  Significant Tests:  See full reports for all details     Dg Chest 2 View  07/17/2011  *RADIOLOGY REPORT*  Clinical Data: Possible opacity on the portable view.  CHEST - 2 VIEW  Comparison: 07/17/2011  Findings: Questionable lesion on portable view corresponds to callus formation along the posterolateral 6th  rib on the right, suggesting remote injury. No lung or acute osseous abnormality identified.  Cardiomediastinal contours within normal limits.  IMPRESSION: No acute cardiopulmonary process.  Original Report Authenticated By: Waneta Martins, M.D.    Today   Subjective:   Lee Sparks today has no headache,no chest abdominal pain,no new weakness tingling or numbness, feels much better wants to go home today.   Objective:   Blood pressure 125/76, pulse 67, temperature 98.3 F (36.8 C), temperature source Oral, resp. rate 20, height 5\' 8"  (1.727 m), weight 71.3 kg (157 lb 3 oz), SpO2 98.00%.  Intake/Output Summary (Last 24 hours) at 08/01/11 0953 Last data filed at 08/01/11 0914  Gross per 24 hour  Intake    840 ml  Output      0 ml  Net    840 ml    Exam Awake Alert, Oriented *3, No new F.N deficits, Normal affect .AT,PERRAL Supple Neck,No JVD, No cervical lymphadenopathy appriciated.  Symmetrical Chest wall movement, Good air movement bilaterally, CTAB RRR,No Gallops,Rubs or new Murmurs, No Parasternal Heave +ve B.Sounds, Abd Soft, Non tender, No organomegaly appriciated, No rebound -guarding or rigidity. No Cyanosis, Clubbing or edema, No new Rash or bruise  Data Review     CBC w Diff: Lab Results  Component Value Date   WBC 5.5 07/31/2011   HGB 11.4* 07/31/2011   HCT 34.3* 07/31/2011   PLT 56* 07/31/2011   LYMPHOPCT 10* 07/27/2011   MONOPCT 10 07/27/2011   EOSPCT 0 07/27/2011   BASOPCT 0 07/27/2011  CMP: Lab Results  Component Value Date   NA 142 07/31/2011   K 3.9 07/31/2011   CL 106 07/31/2011   CO2 27 07/31/2011   BUN 5* 07/31/2011   CREATININE 0.67 07/31/2011   PROT 6.3 07/28/2011   ALBUMIN 3.1* 07/28/2011   BILITOT 1.6* 07/28/2011   ALKPHOS 65 07/28/2011   AST 38* 07/28/2011   ALT 30 07/28/2011  .  Micro Results No results found for this or any previous visit (from the past 240 hour(s)).   Discharge Instructions     Follow with Primary MD or [facility] as  provided by Case Manager  in 7 days   Get CBC, CMP, checked 7 days by Primary MD and again as instructed by your Primary MD. Get a 2 view Chest X ray done next visit if you had Pneumonia of Lung problems at the Hospital.  Get Medicines reviewed and adjusted.  Please request your Prim.MD to go over all Hospital Tests and Procedure/Radiological results at the follow up, please get all Hospital records sent to your Prim MD by signing hospital release before you go home.  Follow-up Information    Make an appointment with Freddy Jaksch, MD.   Contact information:   [address], [address] [employer], [place]. [place] [address] [phone]         Activity: Fall precautions use walker/cane & assistance as needed  Diet: Heart Healthy  Disposition Home  If you experience worsening of your admission symptoms, develop shortness of breath, life threatening emergency, suicidal or homicidal thoughts you must seek medical attention immediately by calling 911 or calling your MD immediately  if symptoms less severe.  You Must read complete instructions/literature along with all the possible adverse reactions/side effects for all the Medicines you take and that have been prescribed to you. Take any new Medicines after you have completely understood and accpet all the possible adverse reactions/side effects.   Do not drive  until you have seen by Primary MD  and advised to drive.  Do not drive when taking Pain medications.    Do not take more than prescribed Pain, Sleep and Anxiety Medications  Special Instructions: If you have smoked or chewed Tobacco or drink Alcohol regularly in the last 2 yrs please stop smoking, stop any regular Alcohol  and or any Recreational drug use.  Wear Seat belts while driving.  Follow-up Information    Make an appointment with Freddy Jaksch, MD.   Contact information:   [address] [facility], [place]. [place] [address] [phone]          Discharge Medications   Medication List  As of 08/01/2011  9:53 AM   START taking these medications         chlordiazePOXIDE 5 MG capsule   Commonly known as: LIBRIUM   Take 1 capsule (5 mg total) by mouth daily at 6 (six) AM.      folic acid 1 MG tablet   Commonly known as: FOLVITE   Take 1 tablet (1 mg total) by mouth daily.      pantoprazole 40 MG tablet   Commonly known as: PROTONIX   Take 1 tablet (40 mg total) by mouth 2 (two) times daily before a meal.      thiamine 100 MG tablet   Take 1 tablet (100 mg total) by mouth daily.         STOP taking these  medications         azithromycin 250 MG tablet      diphenhydrAMINE 25 MG tablet      ranitidine 150 MG tablet          Where to get your medications    These are the prescriptions that you need to pick up.   You may get these medications from any pharmacy.         chlordiazePOXIDE 5 MG capsule   folic acid 1 MG tablet   pantoprazole 40 MG tablet   thiamine 100 MG tablet             Total Time in preparing paper work, data evaluation and todays exam - 35 minutes  Leroy Sea M.D on 08/01/2011 at 9:53 AM  [facility] Office  [phone]

## 2011-08-01 NOTE — Progress Notes (Signed)
Patient discharge teaching given, including activity, diet, follow-up appoints, and medications. Patient verbalized understanding of all discharge instructions. IV access was d/c'd. Vitals are stable. Skin is intact. Pt to be escorted out by NT, to be driven home by family.  

## 2011-08-01 NOTE — Progress Notes (Signed)
APPOINTMENTS MADE WITH:  [address]  [employer]  [facility] [place] [address]  [phone]   RECEPTIONIST WILL CALL WITH APPT TIME AND DATE   FOLLOW UP APPT MADE AT [facility]   [address]  [place], [place] [phone]  ON  JAN 18TH AT [facility] DISCHARGE SUMMARY AND ALL MEDICATIONS

## 2011-08-01 NOTE — Progress Notes (Signed)
.  Clinical social worker completed patient psychosocial assessment, please see assessment in patient shadow chart.  CSW provided patient with requested resources for [employer] substance abuse. Marland KitchenNo further Clinical Social Work needs, signing off.   Catha Gosselin, Theresia Majors  [phone] .08/01/2011 10:30am

## 2011-11-03 ENCOUNTER — Emergency Department (HOSPITAL_COMMUNITY)
Admission: EM | Admit: 2011-11-03 | Discharge: 2011-11-04 | Disposition: A | Discharge status: Home / Self Care | Payer: Self-pay | Attending: Emergency Medicine | Admitting: Emergency Medicine

## 2011-11-03 ENCOUNTER — Encounter (HOSPITAL_COMMUNITY): Payer: Self-pay | Admitting: *Deleted

## 2011-11-03 DIAGNOSIS — F1010 Alcohol abuse, uncomplicated: Secondary | ICD-10-CM | POA: Insufficient documentation

## 2011-11-03 DIAGNOSIS — T148 Other injury of unspecified body region: Secondary | ICD-10-CM | POA: Insufficient documentation

## 2011-11-03 DIAGNOSIS — M791 Myalgia, unspecified site: Secondary | ICD-10-CM

## 2011-11-03 DIAGNOSIS — IMO0001 Reserved for inherently not codable concepts without codable children: Secondary | ICD-10-CM | POA: Insufficient documentation

## 2011-11-03 DIAGNOSIS — Z87891 Personal history of nicotine dependence: Secondary | ICD-10-CM | POA: Insufficient documentation

## 2011-11-03 DIAGNOSIS — L988 Other specified disorders of the skin and subcutaneous tissue: Secondary | ICD-10-CM | POA: Insufficient documentation

## 2011-11-03 DIAGNOSIS — W57XXXA Bitten or stung by nonvenomous insect and other nonvenomous arthropods, initial encounter: Secondary | ICD-10-CM | POA: Insufficient documentation

## 2011-11-03 NOTE — ED Notes (Addendum)
Reports removal of approx 30 ticks this evening; c/o generalized weakness and body aches onset this morning; pt also reports drinking two 40 oz beers tonight.

## 2011-11-04 MED ORDER — DOXYCYCLINE HYCLATE 100 MG PO TABS
100.0000 mg | ORAL_TABLET | Freq: Once | ORAL | Status: AC
  Administered 2011-11-04: 100 mg via ORAL
  Filled 2011-11-04: qty 1

## 2011-11-04 MED ORDER — DOXYCYCLINE HYCLATE 100 MG PO TABS: 100.0000 mg | ORAL_TABLET | Freq: Two times a day (BID) | ORAL | Status: AC

## 2011-11-04 MED ORDER — IBUPROFEN 800 MG PO TABS
800.0000 mg | ORAL_TABLET | Freq: Once | ORAL | Status: AC
  Administered 2011-11-04: 800 mg via ORAL
  Filled 2011-11-04: qty 1

## 2011-11-04 NOTE — Discharge Instructions (Signed)
Wood Tick Bite Ticks are insects that attach themselves to the skin. Most tick bites are harmless, but sometimes ticks carry diseases that can make a person quite ill. The chance of getting ill depends on:  The kind of tick that bites you.   Time of year.   How long the tick is attached.   Geographic location.  Wood ticks are also called dog ticks. They are generally black. They can have white markings. They live in shrubs and grassy areas. They are larger than deer ticks. Wood ticks are about the size of a watermelon seed. They have a hard body. The most common places for ticks to attach themselves are the scalp, neck, armpits, waist, and groin. Wood ticks may stay attached for up to 2 weeks. TICKS MUST BE REMOVED AS SOON AS POSSIBLE TO HELP PREVENT DISEASES CAUSED BY TICK BITES.  TO REMOVE A TICK: 1. If available, put on latex gloves before trying to remove a tick.  2. Grasp the tick as close to the skin as possible, with curved forceps, fine tweezers or a special tick removal tool.  3. Pull gently with steady pressure until the tick lets go. Do not twist the tick or jerk it suddenly. This may break off the tick's head or mouth parts.  4. Do not crush the tick's body. This could force disease-carrying fluids from the tick into your body.  5. After the tick is removed, wash the bite area and your hands with soap and water or other disinfectant.  6. Apply a small amount of antiseptic cream or ointment to the bite site.  7. Wash and disinfect any instruments that were used.  8. Save the tick in a jar or plastic bag for later identification. Preserve the tick with a bit of alcohol or put it in the freezer.  9. Do not apply a hot match, petroleum jelly, or fingernail polish to the tick. This does not work and may increase the chances of disease from the tick bite.  YOU MAY NEED TO SEE YOUR CAREGIVER FOR A TETANUS SHOT NOW IF:  You have no idea when you had the last one.   You have never had a  tetanus shot before.  If you need a tetanus shot, and you decide not to get one, there is a rare chance of getting tetanus. Sickness from tetanus can be serious. If you get a tetanus shot, your arm may swell, get red and warm to the touch at the shot site. This is common and not a problem. PREVENTION  Wear protective clothing. Long sleeves and pants are best.   Wear white clothes to see ticks more easily   Tuck your pant legs into your socks.   If walking on trail, stay in the middle of the trail to avoid brushing against bushes.   Put insect repellent on all exposed skin and along boot tops, pant legs and sleeve cuffs   Check clothing, hair and skin repeatedly and before coming inside.   Brush off any ticks that are not attached.  SEEK MEDICAL CARE IF:   You cannot remove a tick or part of the tick that is left in the skin.   Unexplained fever.   Redness and swelling in the area of the tick bite.   Tender, swollen lymph glands.   Diarrhea.   Weight loss.   Cough.   Fatigue.   Muscle, joint or bone pain.   Belly pain.   Headache.   Rash.    SEEK IMMEDIATE MEDICAL CARE IF:   You develop an oral temperature above 102 F (38.9 C).   You are having trouble walking or moving your legs.   Numbness in the legs.   Shortness of breath.   Confusion.   Repeated vomiting.  Document Released: 07/11/2000 Document Revised: 07/03/2011 Document Reviewed: 06/19/2008 [employer] Patient Information 2012 [employer]. 

## 2011-11-04 NOTE — ED Notes (Signed)
Pt reports he cannot get in touch with anyone to provide him a ride home; I called pt's father with no answer; pt provided a scrub top as he arrived with no shirt on, and wheeled to waiting room to await transport home.

## 2011-11-04 NOTE — ED Provider Notes (Signed)
History     CSN: [id number]  Arrival date & time 11/03/11  2326   First MD Initiated Contact with Patient 11/03/11 2349      Chief Complaint  Patient presents with  . Tick Removal  . Fatigue  . Alcohol Intoxication    (Consider location/radiation/quality/duration/timing/severity/associated sxs/prior treatment) HPI Comments: Patient has been working outdoors the last 3 days and has sustained multiple tick bites after moving some dog houses.  He presents due to generalized myalgias.  He denies fever,  Nausea,  Vomiting,  No rash.  He also admits to drinking 2 40 oz beers tonight and is slightly intoxicated.  He is here with a relative and is not driving home.  Patient is a 41 y.o. male presenting with rash. The history is provided by the patient.  Rash  The problem has been gradually worsening. There has been no fever. The rash is present on the right upper leg, right lower leg, left arm, left upper leg, left lower leg and right arm. The pain is at a severity of 8/10. The patient is experiencing no pain. He has tried nothing for the symptoms.    Past Medical History  Diagnosis Date  . Anemia     Past Surgical History  Procedure Date  . Hernia repair   . Cystectomy   . Esophagogastroduodenoscopy 07/28/2011    Procedure: ESOPHAGOGASTRODUODENOSCOPY (EGD);  Surgeon: Freddy Jaksch, MD;  Location: [facility] ENDOSCOPY;  Service: Endoscopy;  Laterality: N/A;    Family History  Problem Relation Age of Onset  . Cancer Other     History  Substance Use Topics  . Smoking status: Former Smoker -- 0.0 packs/day for 15 years    Types: Cigarettes    Quit date: 06/27/2011  . Smokeless tobacco: Never Used  . Alcohol Use: 30.0 oz/week    50 Cans of beer per week     Equivalent of two 40 oz beers daily.      Review of Systems  Constitutional: Negative for fever and chills.  HENT: Negative for congestion, sore throat and neck pain.   Eyes: Negative.   Respiratory: Negative for chest  tightness and shortness of breath.   Cardiovascular: Negative for chest pain.  Gastrointestinal: Negative for nausea and abdominal pain.  Genitourinary: Negative.   Musculoskeletal: Positive for myalgias. Negative for joint swelling and arthralgias.  Skin: Positive for rash. Negative for wound.  Neurological: Negative for dizziness, weakness, light-headedness, numbness and headaches.  Hematological: Negative.   Psychiatric/Behavioral: Negative.     Allergies  Grapefruit diet or  Home Medications   Current Outpatient Rx  Name Route Sig Dispense Refill  . RANITIDINE HCL 75 MG PO TABS Oral Take 75 mg by mouth daily.    Marland Kitchen DOXYCYCLINE HYCLATE 100 MG PO TABS Oral Take 1 tablet (100 mg total) by mouth 2 (two) times daily. 28 tablet 0  . FOLIC ACID 1 MG PO TABS Oral Take 1 tablet (1 mg total) by mouth daily. 30 tablet 0  . PANTOPRAZOLE SODIUM 40 MG PO TBEC Oral Take 1 tablet (40 mg total) by mouth 2 (two) times daily before a meal. 60 tablet 6  . THIAMINE HCL 100 MG PO TABS Oral Take 1 tablet (100 mg total) by mouth daily. 30 tablet 0    BP 132/84  Pulse 99  Temp(Src) 98.4 F (36.9 C) (Oral)  Resp 20  Ht 5\' 8"  (1.727 m)  Wt 165 lb (74.844 kg)  BMI 25.09 kg/m2  SpO2 95%  Physical  Exam  Nursing note and vitals reviewed. Constitutional: He is oriented to person, place, and time. He appears well-developed and well-nourished.  HENT:  Head: Normocephalic and atraumatic.  Eyes: Conjunctivae are normal.  Neck: Normal range of motion.  Cardiovascular: Normal rate, regular rhythm, normal heart sounds and intact distal pulses.   Pulmonary/Chest: Effort normal and breath sounds normal. He has no wheezes.  Abdominal: Soft. Bowel sounds are normal. There is no tenderness.  Musculoskeletal: Normal range of motion.  Neurological: He is alert and oriented to person, place, and time.  Skin: Skin is warm and dry.       Multiple small papules on upper and lower extremities at tick bite sites.   Papule with a 1 inch surrounding erythema consistent with possible early cellulitis.   No induration.   Psychiatric: He has a normal mood and affect.    ED Course  Procedures (including critical care time)  Labs Reviewed - No data to display No results found.   1. Tick bite of multiple sites   2. Myalgia       MDM  Multiple exposures to ticks with papular rash,  Myalgias and possible early cellulitis at tick bite site right posterior shoulder.  Doxycycline prescribed,  First does given in ed.  Advised to f/u with pcp for recheck in 4-5 days.  Sooner if sx worsen.  Pt understands plan.         Candis Musa, PA 11/04/11 2245

## 2011-11-08 NOTE — ED Provider Notes (Signed)
Medical screening examination/treatment/procedure(s) were performed by non-physician practitioner and as supervising physician I was immediately available for consultation/collaboration.  Zoua Caporaso L Joyce Leckey, MD 11/08/11 0142 

## 2013-06-28 LAB — LIPID PANEL
Cholesterol: 176 mg/dL (ref 0–200)
HDL: 23 mg/dL — AB (ref 35–70)
LDL Cholesterol: 98 mg/dL
Triglycerides: 275 mg/dL — AB (ref 40–160)

## 2013-11-03 LAB — BASIC METABOLIC PANEL
BUN: 7 mg/dL (ref 4–21)
CREATININE: 0.9 mg/dL (ref 0.6–1.3)
GLUCOSE: 87 mg/dL
POTASSIUM: 5 mmol/L (ref 3.4–5.3)
SODIUM: 140 mmol/L (ref 137–147)

## 2013-11-03 LAB — HEPATIC FUNCTION PANEL
ALT: 13 U/L (ref 10–40)
AST: 20 U/L (ref 14–40)
Alkaline Phosphatase: 56 U/L (ref 25–125)
BILIRUBIN, TOTAL: 0.5 mg/dL

## 2013-11-03 LAB — TSH: TSH: 1.16 u[IU]/mL (ref 0.41–5.90)

## 2013-11-03 LAB — HEMOGLOBIN A1C: HEMOGLOBIN A1C: 5.8

## 2013-11-10 LAB — CBC AND DIFFERENTIAL
HCT: 45 % (ref 41–53)
Hemoglobin: 15.8 g/dL (ref 13.5–17.5)
Neutrophils Absolute: 4 /uL
Platelets: 214 10*3/uL (ref 150–399)
WBC: 7.6 10*3/mL

## 2014-03-28 ENCOUNTER — Emergency Department: Payer: Self-pay | Admitting: Emergency Medicine

## 2014-03-28 LAB — COMPREHENSIVE METABOLIC PANEL
AST: 56 U/L — AB (ref 15–37)
Albumin: 4.2 g/dL (ref 3.4–5.0)
Alkaline Phosphatase: 65 U/L
Anion Gap: 7 (ref 7–16)
BUN: 9 mg/dL (ref 7–18)
Bilirubin,Total: 1.4 mg/dL — ABNORMAL HIGH (ref 0.2–1.0)
Calcium, Total: 8.7 mg/dL (ref 8.5–10.1)
Chloride: 101 mmol/L (ref 98–107)
Co2: 27 mmol/L (ref 21–32)
Creatinine: 0.86 mg/dL (ref 0.60–1.30)
Glucose: 106 mg/dL — ABNORMAL HIGH (ref 65–99)
Osmolality: 269 (ref 275–301)
Potassium: 3.8 mmol/L (ref 3.5–5.1)
SGPT (ALT): 56 U/L
SODIUM: 135 mmol/L — AB (ref 136–145)
Total Protein: 8.1 g/dL (ref 6.4–8.2)

## 2014-03-28 LAB — CBC
HCT: 49.6 % (ref 40.0–52.0)
HGB: 16.6 g/dL (ref 13.0–18.0)
MCH: 30.5 pg (ref 26.0–34.0)
MCHC: 33.6 g/dL (ref 32.0–36.0)
MCV: 91 fL (ref 80–100)
Platelet: 165 10*3/uL (ref 150–440)
RBC: 5.45 10*6/uL (ref 4.40–5.90)
RDW: 14.2 % (ref 11.5–14.5)
WBC: 11.1 10*3/uL — ABNORMAL HIGH (ref 3.8–10.6)

## 2014-03-28 LAB — TROPONIN I: Troponin-I: <0.02 ng/mL

## 2014-03-28 LAB — CK TOTAL AND CKMB (NOT AT ARMC)
CK, TOTAL: 242 U/L
CK-MB: 5.4 ng/mL — ABNORMAL HIGH (ref 0.5–3.6)

## 2017-05-01 ENCOUNTER — Encounter: Payer: Self-pay | Admitting: *Deleted

## 2017-05-01 ENCOUNTER — Inpatient Hospital Stay
Admission: EM | Admit: 2017-05-01 | Discharge: 2017-05-04 | DRG: 439 | Disposition: A | Discharge status: Home / Self Care | Payer: Self-pay | Attending: Specialist | Admitting: Specialist

## 2017-05-01 ENCOUNTER — Emergency Department: Payer: Self-pay

## 2017-05-01 DIAGNOSIS — E871 Hypo-osmolality and hyponatremia: Secondary | ICD-10-CM | POA: Diagnosis present

## 2017-05-01 DIAGNOSIS — E860 Dehydration: Secondary | ICD-10-CM | POA: Diagnosis present

## 2017-05-01 DIAGNOSIS — K852 Alcohol induced acute pancreatitis without necrosis or infection: Principal | ICD-10-CM | POA: Diagnosis present

## 2017-05-01 DIAGNOSIS — K219 Gastro-esophageal reflux disease without esophagitis: Secondary | ICD-10-CM | POA: Diagnosis present

## 2017-05-01 DIAGNOSIS — F1010 Alcohol abuse, uncomplicated: Secondary | ICD-10-CM | POA: Diagnosis present

## 2017-05-01 DIAGNOSIS — Z87891 Personal history of nicotine dependence: Secondary | ICD-10-CM

## 2017-05-01 DIAGNOSIS — Z79899 Other long term (current) drug therapy: Secondary | ICD-10-CM

## 2017-05-01 DIAGNOSIS — K859 Acute pancreatitis without necrosis or infection, unspecified: Secondary | ICD-10-CM | POA: Diagnosis present

## 2017-05-01 DIAGNOSIS — K449 Diaphragmatic hernia without obstruction or gangrene: Secondary | ICD-10-CM | POA: Diagnosis present

## 2017-05-01 DIAGNOSIS — Z91018 Allergy to other foods: Secondary | ICD-10-CM

## 2017-05-01 LAB — URINALYSIS, COMPLETE (UACMP) WITH MICROSCOPIC
BACTERIA UA: NONE SEEN
Bilirubin Urine: NEGATIVE
Glucose, UA: NEGATIVE mg/dL
Hgb urine dipstick: NEGATIVE
KETONES UR: 5 mg/dL — AB
Leukocytes, UA: NEGATIVE
Nitrite: NEGATIVE
PH: 7 (ref 5.0–8.0)
PROTEIN: NEGATIVE mg/dL
RBC / HPF: NONE SEEN RBC/hpf (ref 0–5)
SQUAMOUS EPITHELIAL / LPF: NONE SEEN
Specific Gravity, Urine: 1.004 — ABNORMAL LOW (ref 1.005–1.030)

## 2017-05-01 LAB — CBC
HEMATOCRIT: 49.2 % (ref 40.0–52.0)
Hemoglobin: 17.2 g/dL (ref 13.0–18.0)
MCH: 30.5 pg (ref 26.0–34.0)
MCHC: 34.9 g/dL (ref 32.0–36.0)
MCV: 87.2 fL (ref 80.0–100.0)
PLATELETS: 162 10*3/uL (ref 150–440)
RBC: 5.64 MIL/uL (ref 4.40–5.90)
RDW: 13.8 % (ref 11.5–14.5)
WBC: 17.3 10*3/uL — ABNORMAL HIGH (ref 3.8–10.6)

## 2017-05-01 LAB — COMPREHENSIVE METABOLIC PANEL
ALT: 42 U/L (ref 17–63)
AST: 41 U/L (ref 15–41)
Albumin: 4.4 g/dL (ref 3.5–5.0)
Alkaline Phosphatase: 50 U/L (ref 38–126)
Anion gap: 13 (ref 5–15)
BUN: 8 mg/dL (ref 6–20)
CO2: 19 mmol/L — AB (ref 22–32)
CREATININE: 0.61 mg/dL (ref 0.61–1.24)
Calcium: 9.3 mg/dL (ref 8.9–10.3)
Chloride: 97 mmol/L — ABNORMAL LOW (ref 101–111)
GFR calc Af Amer: >60 mL/min (ref >60)
GFR calc non Af Amer: >60 mL/min (ref >60)
Glucose, Bld: 131 mg/dL — ABNORMAL HIGH (ref 65–99)
Potassium: 3.9 mmol/L (ref 3.5–5.1)
SODIUM: 129 mmol/L — AB (ref 135–145)
Total Bilirubin: 1.5 mg/dL — ABNORMAL HIGH (ref 0.3–1.2)
Total Protein: 8.1 g/dL (ref 6.5–8.1)

## 2017-05-01 LAB — LIPASE, BLOOD: Lipase: 246 U/L — ABNORMAL HIGH (ref 11–51)

## 2017-05-01 MED ORDER — MORPHINE SULFATE (PF) 4 MG/ML IV SOLN
4.0000 mg | Freq: Once | INTRAVENOUS | Status: AC
  Administered 2017-05-01: 4 mg via INTRAVENOUS
  Filled 2017-05-01: qty 1

## 2017-05-01 MED ORDER — ONDANSETRON HCL 4 MG/2ML IJ SOLN
4.0000 mg | Freq: Four times a day (QID) | INTRAMUSCULAR | Status: DC | PRN
  Administered 2017-05-02 – 2017-05-03 (×2): 4 mg via INTRAVENOUS
  Filled 2017-05-01 (×2): qty 2

## 2017-05-01 MED ORDER — ONDANSETRON HCL 4 MG/2ML IJ SOLN
INTRAMUSCULAR | Status: AC
  Filled 2017-05-01: qty 2

## 2017-05-01 MED ORDER — ACETAMINOPHEN 325 MG PO TABS
650.0000 mg | ORAL_TABLET | Freq: Four times a day (QID) | ORAL | Status: DC | PRN
  Administered 2017-05-01 – 2017-05-02 (×2): 650 mg via ORAL
  Filled 2017-05-01 (×2): qty 2

## 2017-05-01 MED ORDER — LORAZEPAM 2 MG/ML IJ SOLN: 1.0000 mg | Freq: Four times a day (QID) | INTRAMUSCULAR | Status: DC | PRN

## 2017-05-01 MED ORDER — IOPAMIDOL (ISOVUE-300) INJECTION 61%
100.0000 mL | Freq: Once | INTRAVENOUS | Status: AC | PRN
  Administered 2017-05-01: 100 mL via INTRAVENOUS

## 2017-05-01 MED ORDER — THIAMINE HCL 100 MG/ML IJ SOLN: 100.0000 mg | Freq: Every day | INTRAMUSCULAR | Status: DC

## 2017-05-01 MED ORDER — PANTOPRAZOLE SODIUM 40 MG PO TBEC
40.0000 mg | DELAYED_RELEASE_TABLET | Freq: Two times a day (BID) | ORAL | Status: DC
  Administered 2017-05-01 – 2017-05-04 (×6): 40 mg via ORAL
  Filled 2017-05-01 (×6): qty 1

## 2017-05-01 MED ORDER — VITAMIN B-1 100 MG PO TABS
100.0000 mg | ORAL_TABLET | Freq: Every day | ORAL | Status: DC
  Administered 2017-05-01 – 2017-05-04 (×4): 100 mg via ORAL
  Filled 2017-05-01 (×4): qty 1

## 2017-05-01 MED ORDER — ONDANSETRON HCL 4 MG/2ML IJ SOLN
4.0000 mg | Freq: Once | INTRAMUSCULAR | Status: AC
  Administered 2017-05-01: 4 mg via INTRAVENOUS

## 2017-05-01 MED ORDER — IOPAMIDOL (ISOVUE-300) INJECTION 61%
30.0000 mL | Freq: Once | INTRAVENOUS | Status: AC | PRN
  Administered 2017-05-01: 30 mL via ORAL

## 2017-05-01 MED ORDER — PROMETHAZINE HCL 25 MG/ML IJ SOLN
12.5000 mg | Freq: Four times a day (QID) | INTRAMUSCULAR | Status: DC | PRN
  Administered 2017-05-01: 12.5 mg via INTRAVENOUS
  Filled 2017-05-01: qty 1

## 2017-05-01 MED ORDER — SODIUM CHLORIDE 0.9 % IV BOLUS (SEPSIS)
1000.0000 mL | Freq: Once | INTRAVENOUS | Status: AC
  Administered 2017-05-01: 1000 mL via INTRAVENOUS

## 2017-05-01 MED ORDER — LORAZEPAM 1 MG PO TABS: 1.0000 mg | ORAL_TABLET | Freq: Four times a day (QID) | ORAL | Status: DC | PRN

## 2017-05-01 MED ORDER — ADULT MULTIVITAMIN W/MINERALS CH
1.0000 | ORAL_TABLET | Freq: Every day | ORAL | Status: DC
  Administered 2017-05-01 – 2017-05-04 (×4): 1 via ORAL
  Filled 2017-05-01 (×4): qty 1

## 2017-05-01 MED ORDER — FOLIC ACID 1 MG PO TABS
1.0000 mg | ORAL_TABLET | Freq: Every day | ORAL | Status: DC
  Administered 2017-05-01 – 2017-05-04 (×4): 1 mg via ORAL
  Filled 2017-05-01 (×4): qty 1

## 2017-05-01 MED ORDER — ONDANSETRON HCL 4 MG/2ML IJ SOLN
4.0000 mg | Freq: Once | INTRAMUSCULAR | Status: AC
  Administered 2017-05-01: 4 mg via INTRAVENOUS
  Filled 2017-05-01: qty 2

## 2017-05-01 MED ORDER — ENOXAPARIN SODIUM 40 MG/0.4ML ~~LOC~~ SOLN
40.0000 mg | SUBCUTANEOUS | Status: DC
  Filled 2017-05-01: qty 0.4

## 2017-05-01 MED ORDER — INFLUENZA VAC SPLIT QUAD 0.5 ML IM SUSY: 0.5000 mL | PREFILLED_SYRINGE | INTRAMUSCULAR | Status: DC

## 2017-05-01 MED ORDER — ACETAMINOPHEN 650 MG RE SUPP: 650.0000 mg | Freq: Four times a day (QID) | RECTAL | Status: DC | PRN

## 2017-05-01 MED ORDER — MORPHINE SULFATE (PF) 2 MG/ML IV SOLN
2.0000 mg | INTRAVENOUS | Status: DC | PRN
  Administered 2017-05-01 – 2017-05-03 (×5): 2 mg via INTRAVENOUS
  Filled 2017-05-01 (×5): qty 1

## 2017-05-01 MED ORDER — SODIUM CHLORIDE 0.9 % IV SOLN
INTRAVENOUS | Status: DC
  Administered 2017-05-01 – 2017-05-04 (×7): via INTRAVENOUS

## 2017-05-01 MED ORDER — HYDROMORPHONE HCL 1 MG/ML IJ SOLN
1.0000 mg | INTRAMUSCULAR | Status: DC | PRN
  Administered 2017-05-01 – 2017-05-03 (×5): 1 mg via INTRAVENOUS
  Filled 2017-05-01 (×5): qty 1

## 2017-05-01 MED ORDER — ONDANSETRON HCL 4 MG PO TABS
4.0000 mg | ORAL_TABLET | Freq: Four times a day (QID) | ORAL | Status: DC | PRN
  Administered 2017-05-04: 4 mg via ORAL
  Filled 2017-05-01: qty 1

## 2017-05-01 NOTE — ED Notes (Signed)
MD Lord at bedside. 

## 2017-05-01 NOTE — H&P (Signed)
[facility] - [place] at [facility]   PATIENT NAME: Lee Sparks    MR#:  [id number]  DATE OF BIRTH:  [birth year]  DATE OF ADMISSION:  05/01/2017  PRIMARY CARE PHYSICIAN: Patient, No Pcp Per   REQUESTING/REFERRING PHYSICIAN: Dr. Governor Rooks  CHIEF COMPLAINT:   Chief Complaint  Patient presents with  . Abdominal Pain    HISTORY OF PRESENT ILLNESS:  Lee Sparks  is a 46 y.o. male with a known history of Alcohol abuse, GERD, history of a hiatal hernia who presents to the hospital due to left upper quadrant abdominal pain. Patient says that he was in his usual state of health when he developed left upper quadrant abdominal pain yesterday and has progressively gotten worse. He does have associated nausea but no vomiting. He denies any fevers chills, melena, hematochezia or any diarrhea. He presents to the emergency room and was noted to have laboratory work consistent with mild pancreatitis. He does have a history of alcohol abuse as he drinks about 6-8 beers daily. Hospitalist services were contacted further treatment and evaluation.  PAST MEDICAL HISTORY:   Past Medical History:  Diagnosis Date  . Anemia     PAST SURGICAL HISTORY:   Past Surgical History:  Procedure Laterality Date  . CYSTECTOMY    . ESOPHAGOGASTRODUODENOSCOPY  07/28/2011   Procedure: ESOPHAGOGASTRODUODENOSCOPY (EGD);  Surgeon: Freddy Jaksch, MD;  Location: [facility] ENDOSCOPY;  Service: Endoscopy;  Laterality: N/A;  . HERNIA REPAIR      SOCIAL HISTORY:   Social History  Substance Use Topics  . Smoking status: Former Smoker    Packs/day: 0.03    Years: 15.00    Types: Cigarettes    Quit date: 06/27/2011  . Smokeless tobacco: Never Used  . Alcohol use 30.0 oz/week    50 Cans of beer per week     Comment: Equivalent of two 40 oz beers daily.    FAMILY HISTORY:   Family History  Problem Relation Age of Onset  . Hypertension Father   . Aneurysm Father   . Cancer Other   .  Hypertension Mother   . Pneumonia Mother     DRUG ALLERGIES:   Allergies  Allergen Reactions  . Grapefruit Diet Or [Extra Strength Grapefruit] Hives    Pt states he has a reaction to grapefruit.     REVIEW OF SYSTEMS:   Review of Systems  Constitutional: Negative for fever and weight loss.  HENT: Negative for congestion, nosebleeds and tinnitus.   Eyes: Negative for blurred vision, double vision and redness.  Respiratory: Negative for cough, hemoptysis and shortness of breath.   Cardiovascular: Negative for chest pain, orthopnea, leg swelling and PND.  Gastrointestinal: Positive for abdominal pain, nausea and vomiting. Negative for diarrhea and melena.  Genitourinary: Negative for dysuria, hematuria and urgency.  Musculoskeletal: Negative for falls and joint pain.  Neurological: Negative for dizziness, tingling, sensory change, focal weakness, seizures, weakness and headaches.  Endo/Heme/Allergies: Negative for polydipsia. Does not bruise/bleed easily.  Psychiatric/Behavioral: Negative for depression and memory loss. The patient is not nervous/anxious.     MEDICATIONS AT HOME:   Prior to Admission medications   Medication Sig Start Date End Date Taking? Authorizing Provider  pantoprazole (PROTONIX) 40 MG tablet Take 1 tablet (40 mg total) by mouth 2 (two) times daily before a meal. 08/01/11 07/31/12  Leroy Sea, MD      VITAL SIGNS:  Blood pressure (!) 147/96, pulse 97, temperature 98.1 F (36.7 C), temperature source Oral,  resp. rate 18, height  (1.727 m), weight 85.3 kg (188 lb), SpO2 96 %.  PHYSICAL EXAMINATION:  Physical Exam  GENERAL:  46 y.o.-year-old patient lying in the bed in no acute distress.  EYES: Pupils equal, round, reactive to light and accommodation. No scleral icterus. Extraocular muscles intact.  HEENT: Head atraumatic, normocephalic. Oropharynx and nasopharynx clear. No oropharyngeal erythema, moist oral mucosa  NECK:  Supple, no jugular  venous distention. No thyroid enlargement, no tenderness.  LUNGS: Normal breath sounds bilaterally, no wheezing, rales, rhonchi. No use of accessory muscles of respiration.  CARDIOVASCULAR: S1, S2 RRR. No murmurs, rubs, gallops, clicks.  ABDOMEN: Soft, tender in the left upper quadrant, no rebound or rigidity, nondistended. Bowel sounds present. No organomegaly or mass.  EXTREMITIES: No pedal edema, cyanosis, or clubbing. + 2 pedal & radial pulses b/l.   NEUROLOGIC: Cranial nerves II through XII are intact. No focal Motor or sensory deficits appreciated b/l PSYCHIATRIC: The patient is alert and oriented x 3.   SKIN: No obvious rash, lesion, or ulcer.   LABORATORY PANEL:   CBC  Recent Labs Lab 05/01/17 1027  WBC 17.3*  HGB 17.2  HCT 49.2  PLT 162   ------------------------------------------------------------------------------------------------------------------  Chemistries   Recent Labs Lab 05/01/17 1027  NA 129*  K 3.9  CL 97*  CO2 19*  GLUCOSE 131*  BUN 8  CREATININE 0.61  CALCIUM 9.3  AST 41  ALT 42  ALKPHOS 50  BILITOT 1.5*   ------------------------------------------------------------------------------------------------------------------  Cardiac Enzymes No results for input(s): TROPONINI in the last 168 hours. ------------------------------------------------------------------------------------------------------------------  RADIOLOGY:  Ct Abdomen Pelvis W Contrast  Result Date: 05/01/2017 CLINICAL DATA:  46 y/o M; left upper quadrant abdominal pain for 1 day. EXAM: CT ABDOMEN AND PELVIS WITH CONTRAST TECHNIQUE: Multidetector CT imaging of the abdomen and pelvis was performed using the standard protocol following bolus administration of intravenous contrast. CONTRAST:  ISOVUE-300 IOPAMIDOL (ISOVUE-300) INJECTION 61% COMPARISON:  None. FINDINGS: Lower chest: No acute abnormality. Hepatobiliary: No focal liver abnormality is seen. No gallstones, gallbladder  wall thickening, or biliary dilatation. Pancreas: Moderate edema surrounding the pancreatic body and tail in the left upper quadrant likely representing acute pancreatitis. No rim enhancing collection or evidence for pancreatic necrosis. No pancreatic ductal dilatation. Spleen: Normal in size without focal abnormality. Adrenals/Urinary Tract: Adrenal glands are unremarkable. Kidneys are normal, without renal calculi, focal lesion, or hydronephrosis. Bladder is unremarkable. Stomach/Bowel: Stomach is within normal limits. Appendix appears normal. No evidence of bowel wall thickening, distention, or inflammatory changes. Vascular/Lymphatic: No significant vascular findings are present. No enlarged abdominal or pelvic lymph nodes. Reproductive: Prostate is unremarkable. Other: Ventral midline upper abdominal wall hernia containing fat with 0.9 cm neck 10 cm superior to umbilicus. Musculoskeletal: L5-S1 grade 1 anterolisthesis and chronic appearing right-sided L5 pars defect. Minimal L4-5 retrolisthesis. IMPRESSION: 1. Acute pancreatitis involving body and tail with surrounding edema. No acute peripancreatic collection or evidence for pancreatic necrosis. No pancreatic ductal dilatation. 2. Small midline ventral upper abdominal wall hernia containing fat. 3. L5-S1 grade 1 anterolisthesis and chronic right-sided L5 pars defect. Electronically Signed   By: Mitzi Hansen M.D.   On: 05/01/2017 15:22     IMPRESSION AND PLAN:   46 year old male with past medical history of alcohol abuse, history of hiatal hernia, GERD who presents to the hospital due to left upper quadrant abdominal pain, nausea and noted to have clinical findings and CT scan findings suggestive of acute pancreatitis.  1. Acute pancreatitis-this is likely acute  alcoholic pancreatitis. Patient does drink about 6-8 beers daily. Patient CT scan is consistent with acute pancreatitis involving the body and tail. -Supportive care with IV fluids,  antiemetics, pain control. We'll place the patient on a clear liquid diet.  -follow lipase in a.m.  2. Hyponatremia-mild secondary to alcohol abuse and dehydration. -We'll gently hydrate the patient with IV fluids, follow sodium.  3. Alcohol abuse - We'll place the patient on CIWA protocol. - watch for withdrawal.   4. GERD/hx of Hiatal Hernia with repair - cont. Protonix.    All the records are reviewed and case discussed with ED provider. Management plans discussed with the patient, family and they are in agreement.  CODE STATUS: Full code  TOTAL TIME TAKING CARE OF THIS PATIENT: 45 minutes.    Houston Siren M.D on 05/01/2017 at 3:30 PM  Between 7am to 6pm - Pager - [phone]  After 6pm go to [email or web address] - password [email or web address]  [facility]  Office  [phone]  CC: Primary care physician; Patient, No Pcp Per

## 2017-05-01 NOTE — ED Triage Notes (Signed)
Pt complains of left abdominal pain,  Pt drinks ETOH daily, pt consumes 8 beers daily , more on the weekends, pt last drank last night, pt complains of nausea

## 2017-05-01 NOTE — ED Provider Notes (Addendum)
[facility] Emergency Department Provider Note ____________________________________________   I have reviewed the triage vital signs and the triage nursing note.  HISTORY  Chief Complaint Abdominal Pain   Historian Patient here with (girl) friend  HPI Lee Sparks is a 46 y.o. male presents with abdominal pain, history of Daily alcohol use with a history of all withdrawal in the past, presents with abdominal pain and nausea for about 2 days. Pain has been so bad and located in epigastrium that he has not really wanted to eat or drink much. No vomiting or vomiting blood. No black or bloody stools. Nothing makes it worse or better. no constipation or diarrhea. No fever. No chest pain or trouble breathing. Pain is moderate to severe and unrelenting.    Past Medical History:  Diagnosis Date  . Anemia     Patient Active Problem List   Diagnosis Date Noted  . Thrombocytopenia (HCC) 07/29/2011  . Cocaine abuse (HCC) 07/28/2011  . Upper GI bleeding 07/27/2011  . Tachycardia 07/27/2011    Class: Acute  . Hypertension 07/27/2011    Class: Acute  . Alcohol withdrawal syndrome (HCC) 07/27/2011    Class: Acute  . Abdominal pain 07/27/2011    Class: Acute    Past Surgical History:  Procedure Laterality Date  . CYSTECTOMY    . ESOPHAGOGASTRODUODENOSCOPY  07/28/2011   Procedure: ESOPHAGOGASTRODUODENOSCOPY (EGD);  Surgeon: Freddy Jaksch, MD;  Location: [facility] ENDOSCOPY;  Service: Endoscopy;  Laterality: N/A;  . HERNIA REPAIR      Prior to Admission medications   Medication Sig Start Date End Date Taking? Authorizing Provider  pantoprazole (PROTONIX) 40 MG tablet Take 1 tablet (40 mg total) by mouth 2 (two) times daily before a meal. 08/01/11 07/31/12  Leroy Sea, MD    Allergies  Allergen Reactions  . Grapefruit Diet Or [Extra Strength Grapefruit] Hives    Pt states he has a reaction to grapefruit.     Family History  Problem Relation Age of Onset   . Hypertension Father   . Cancer Other   . Hypertension Mother     Social History Social History  Substance Use Topics  . Smoking status: Former Smoker    Packs/day: 0.03    Years: 15.00    Types: Cigarettes    Quit date: 06/27/2011  . Smokeless tobacco: Never Used  . Alcohol use 30.0 oz/week    50 Cans of beer per week     Comment: Equivalent of two 40 oz beers daily.    Review of Systems  Constitutional: Negative for fever. Eyes: Negative for visual changes. ENT: Negative for sore throat. Cardiovascular: Negative for chest pain. Respiratory: Negative for shortness of breath. Gastrointestinal: positive for epigastric pain as per history of present illness. Genitourinary: Negative for dysuria. Musculoskeletal: Negative for back pain. Skin: Negative for rash. Neurological: Negative for headache.  ____________________________________________   PHYSICAL EXAM:  VITAL SIGNS: ED Triage Vitals  Enc Vitals Group     BP 05/01/17 1028 (!) 141/99     Pulse Rate 05/01/17 1028 (!) 109     Resp 05/01/17 1028 18     Temp 05/01/17 1028 98.1 F (36.7 C)     Temp Source 05/01/17 1028 Oral     SpO2 05/01/17 1028 97 %     Weight 05/01/17 1029 188 lb (85.3 kg)     Height 05/01/17 1029  (1.727 m)     Head Circumference --      Peak Flow --  Pain Score 05/01/17 1028 10     Pain Loc --      Pain Edu? --      Excl. in GC? --      Constitutional: Alert and oriented. Well appearing and in no distress. HEENT   Head: Normocephalic and atraumatic.      Eyes: Conjunctivae are normal. Pupils equal and round.       Ears:         Nose: No congestion/rhinnorhea.   Mouth/Throat: Mucous membranes are moist.   Neck: No stridor. Cardiovascular/Chest: Normal rate, regular rhythm.  No murmurs, rubs, or gallops. Respiratory: Normal respiratory effort without tachypnea nor retractions. Breath sounds are clear and equal bilaterally. No  wheezes/rales/rhonchi. Gastrointestinal: Soft. No distention, no guarding, no rebound.Marland Kitchen tenderness in the epigastrium, no lower tenderness of the abdomen. Genitourinary/rectal:Deferred Musculoskeletal: Nontender with normal range of motion in all extremities. No joint effusions.  No lower extremity tenderness.  No edema. Neurologic:  Normal speech and language. No gross or focal neurologic deficits are appreciated. Skin:  Skin is warm, dry and intact. No rash noted. Psychiatric: Mood and affect are normal. Speech and behavior are normal. Patient exhibits appropriate insight and judgment.   ____________________________________________  LABS (pertinent positives/negatives) I, Governor Rooks, MD the attending physician have reviewed the labs noted below.  Labs Reviewed  LIPASE, BLOOD - Abnormal; Notable for the following:       Result Value   Lipase 246 (*)    All other components within normal limits  COMPREHENSIVE METABOLIC PANEL - Abnormal; Notable for the following:    Sodium 129 (*)    Chloride 97 (*)    CO2 19 (*)    Glucose, Bld 131 (*)    Total Bilirubin 1.5 (*)    All other components within normal limits  CBC - Abnormal; Notable for the following:    WBC 17.3 (*)    All other components within normal limits  URINALYSIS, COMPLETE (UACMP) WITH MICROSCOPIC    ____________________________________________    EKG I, Governor Rooks, MD, the attending physician have personally viewed and interpreted all ECGs.  111 bpm. Sinus tachycardia. Axis. Normal axis. Normal ST and T-wave ____________________________________________  RADIOLOGY All Xrays were viewed by me.  Imaging interpreted by Radiologist, and I, Governor Rooks, MD the attending physician have reviewed the radiologist interpretation noted below.  CT abdomen with contrast:  IMPRESSION: 1. Acute pancreatitis involving body and tail with surrounding edema. No acute peripancreatic collection or evidence for  pancreatic necrosis. No pancreatic ductal dilatation. 2. Small midline ventral upper abdominal wall hernia containing fat. 3. L5-S1 grade 1 anterolisthesis and chronic right-sided L5 pars defect. __________________________________________  PROCEDURES  Procedure(s) performed: None  Critical Care performed: None  ____________________________________________  No current facility-administered medications on file prior to encounter.    Current Outpatient Prescriptions on File Prior to Encounter  Medication Sig Dispense Refill  . pantoprazole (PROTONIX) 40 MG tablet Take 1 tablet (40 mg total) by mouth 2 (two) times daily before a meal. 60 tablet 6    ____________________________________________  ED COURSE / ASSESSMENT AND PLAN  Pertinent labs & imaging results that were available during my care of the patient were reviewed by me and considered in my medical decision making (see chart for details).    Mr. Brosh presented for epigastric pain and is found to have pancreatitis likely alcoholic based on his alcohol use history. He is at high risk for alcohol withdrawal syndrome and is reporting that he would like help  with detox. I think since he is going to have to abstain for treatment of his pancreatitis, I would be better to treat this patient as an inpatient for ongoing symptoms related to pancreatitis as well as be able to monitor for signs of withdrawal.  CT scan is pending, but plan disposition is admit to medicine unless CT scan changes disposition.  I spoke with Dr. Imogene Burn, will start hospitalist admission with CT pending.  DIFFERENTIAL DIAGNOSIS: Differential diagnosis includes, but is not limited to, biliary disease (biliary colic, acute cholecystitis, cholangitis, choledocholithiasis, etc), intrathoracic causes for epigastric abdominal pain including ACS, gastritis, duodenitis, pancreatitis, small bowel or large bowel obstruction, abdominal aortic aneurysm, hernia, and  gastritis.  CONSULTATIONS:  Hospitalist for admission, spoke with Dr. Imogene Burn who will speak to Dr. Cherlynn Kaiser.    Patient / Family / Caregiver informed of clinical course, medical decision-making process, and agree with plan.   ___________________________________________   FINAL CLINICAL IMPRESSION(S) / ED DIAGNOSES   Final diagnoses:  Alcohol-induced acute pancreatitis, unspecified complication status  Hyponatremia  Alcohol abuse              Note: This dictation was prepared with Dragon dictation. Any transcriptional errors that result from this process are unintentional    Governor Rooks, MD 05/01/17 1446    Governor Rooks, MD 05/01/17 1454    Governor Rooks, MD 05/01/17 [phone]

## 2017-05-01 NOTE — Progress Notes (Signed)
Patient admitted to room 141. Pt alert and oriented x 4. Patient rates pain 8/10 left upper quadrant and verbalizes nausea; zofran was given in ED. MD [facility] paged and received verbal orders for phenergan 12.5 mg c/6hrs PRN.     Stephannie Peters, RN

## 2017-05-01 NOTE — ED Triage Notes (Signed)
First Nurse Note:  C/O LUQ abdominal pain x 1 day.  Denies N/V/D.

## 2017-05-02 LAB — CBC
HCT: 43.7 % (ref 40.0–52.0)
Hemoglobin: 15.1 g/dL (ref 13.0–18.0)
MCH: 30.2 pg (ref 26.0–34.0)
MCHC: 34.5 g/dL (ref 32.0–36.0)
MCV: 87.5 fL (ref 80.0–100.0)
PLATELETS: 130 10*3/uL — AB (ref 150–440)
RBC: 5 MIL/uL (ref 4.40–5.90)
RDW: 14.2 % (ref 11.5–14.5)
WBC: 17 10*3/uL — AB (ref 3.8–10.6)

## 2017-05-02 LAB — COMPREHENSIVE METABOLIC PANEL
ALT: 29 U/L (ref 17–63)
AST: 26 U/L (ref 15–41)
Albumin: 3.7 g/dL (ref 3.5–5.0)
Alkaline Phosphatase: 39 U/L (ref 38–126)
Anion gap: 5 (ref 5–15)
BUN: 6 mg/dL (ref 6–20)
CHLORIDE: 106 mmol/L (ref 101–111)
CO2: 26 mmol/L (ref 22–32)
CREATININE: 0.79 mg/dL (ref 0.61–1.24)
Calcium: 8.8 mg/dL — ABNORMAL LOW (ref 8.9–10.3)
GFR calc non Af Amer: >60 mL/min (ref >60)
Glucose, Bld: 118 mg/dL — ABNORMAL HIGH (ref 65–99)
Potassium: 4.5 mmol/L (ref 3.5–5.1)
SODIUM: 137 mmol/L (ref 135–145)
Total Bilirubin: 1.2 mg/dL (ref 0.3–1.2)
Total Protein: 7 g/dL (ref 6.5–8.1)

## 2017-05-02 LAB — LIPASE, BLOOD: Lipase: 123 U/L — ABNORMAL HIGH (ref 11–51)

## 2017-05-02 NOTE — Progress Notes (Signed)
[employer] - [place] at [facility]   PATIENT NAME: Lee Sparks    MR#:  [id number]  DATE OF BIRTH:  1970/08/07  SUBJECTIVE:   Pt. Here due to abdominal pain due to acute pancreatitis. Still having significant abdominal pain but no nausea or vomiting. Lipase has trended down. No evidence of clinical alcohol withdrawal presently.  REVIEW OF SYSTEMS:    Review of Systems  Constitutional: Negative for chills and fever.  HENT: Negative for congestion and tinnitus.   Eyes: Negative for blurred vision and double vision.  Respiratory: Negative for cough, shortness of breath and wheezing.   Cardiovascular: Negative for chest pain, orthopnea and PND.  Gastrointestinal: Positive for abdominal pain and nausea. Negative for diarrhea and vomiting.  Genitourinary: Negative for dysuria and hematuria.  Neurological: Negative for dizziness, sensory change and focal weakness.  All other systems reviewed and are negative.   Nutrition: Clear liquids Tolerating Diet: Yes Tolerating PT: Ambulatory   DRUG ALLERGIES:   Allergies  Allergen Reactions  . Grapefruit Diet Or [Extra Strength Grapefruit] Hives    Pt states he has a reaction to grapefruit.     VITALS:  Blood pressure 132/82, pulse (!) 118, temperature 99.5 F (37.5 C), temperature source Oral, resp. rate 18, height  (1.727 m), weight 85.3 kg (188 lb), SpO2 98 %.  PHYSICAL EXAMINATION:   Physical Exam  GENERAL:  46 y.o.-year-old patient lying in bed in no acute distress.  EYES: Pupils equal, round, reactive to light and accommodation. No scleral icterus. Extraocular muscles intact.  HEENT: Head atraumatic, normocephalic. Oropharynx and nasopharynx clear.  NECK:  Supple, no jugular venous distention. No thyroid enlargement, no tenderness.  LUNGS: Normal breath sounds bilaterally, no wheezing, rales, rhonchi. No use of accessory muscles of respiration.  CARDIOVASCULAR: S1, S2 normal. No murmurs, rubs, or  gallops.  ABDOMEN: Soft, Epigastric/left upper quadrant abdominal pain but no rebound or rigidity, nondistended. Bowel sounds present. No organomegaly or mass.  EXTREMITIES: No cyanosis, clubbing or edema b/l.    NEUROLOGIC: Cranial nerves II through XII are intact. No focal Motor or sensory deficits b/l.   PSYCHIATRIC: The patient is alert and oriented x 3.  SKIN: No obvious rash, lesion, or ulcer.    LABORATORY PANEL:   CBC  Recent Labs Lab 05/02/17 0402  WBC 17.0*  HGB 15.1  HCT 43.7  PLT 130*   ------------------------------------------------------------------------------------------------------------------  Chemistries   Recent Labs Lab 05/02/17 0402  NA 137  K 4.5  CL 106  CO2 26  GLUCOSE 118*  BUN 6  CREATININE 0.79  CALCIUM 8.8*  AST 26  ALT 29  ALKPHOS 39  BILITOT 1.2   ------------------------------------------------------------------------------------------------------------------  Cardiac Enzymes No results for input(s): TROPONINI in the last 168 hours. ------------------------------------------------------------------------------------------------------------------  RADIOLOGY:  Ct Abdomen Pelvis W Contrast  Result Date: 05/01/2017 CLINICAL DATA:  46 y/o M; left upper quadrant abdominal pain for 1 day. EXAM: CT ABDOMEN AND PELVIS WITH CONTRAST TECHNIQUE: Multidetector CT imaging of the abdomen and pelvis was performed using the standard protocol following bolus administration of intravenous contrast. CONTRAST:  ISOVUE-300 IOPAMIDOL (ISOVUE-300) INJECTION 61% COMPARISON:  None. FINDINGS: Lower chest: No acute abnormality. Hepatobiliary: No focal liver abnormality is seen. No gallstones, gallbladder wall thickening, or biliary dilatation. Pancreas: Moderate edema surrounding the pancreatic body and tail in the left upper quadrant likely representing acute pancreatitis. No rim enhancing collection or evidence for pancreatic necrosis. No pancreatic  ductal dilatation. Spleen: Normal in size without focal abnormality. Adrenals/Urinary Tract:  Adrenal glands are unremarkable. Kidneys are normal, without renal calculi, focal lesion, or hydronephrosis. Bladder is unremarkable. Stomach/Bowel: Stomach is within normal limits. Appendix appears normal. No evidence of bowel wall thickening, distention, or inflammatory changes. Vascular/Lymphatic: No significant vascular findings are present. No enlarged abdominal or pelvic lymph nodes. Reproductive: Prostate is unremarkable. Other: Ventral midline upper abdominal wall hernia containing fat with 0.9 cm neck 10 cm superior to umbilicus. Musculoskeletal: L5-S1 grade 1 anterolisthesis and chronic appearing right-sided L5 pars defect. Minimal L4-5 retrolisthesis. IMPRESSION: 1. Acute pancreatitis involving body and tail with surrounding edema. No acute peripancreatic collection or evidence for pancreatic necrosis. No pancreatic ductal dilatation. 2. Small midline ventral upper abdominal wall hernia containing fat. 3. L5-S1 grade 1 anterolisthesis and chronic right-sided L5 pars defect. Electronically Signed   By: Mitzi Hansen M.D.   On: 05/01/2017 15:22     ASSESSMENT AND PLAN:   45 year old male with past medical history of alcohol abuse, history of hiatal hernia, GERD who presents to the hospital due to left upper quadrant abdominal pain, nausea and noted to have clinical findings and CT scan findings suggestive of acute pancreatitis.  1. Acute pancreatitis-this is likely acute alcoholic pancreatitis. Patient does drink about 6-8 beers daily. Patient CT scan was consistent with acute pancreatitis involving the body and tail. - cont. Supportive care with IV fluids, antiemetics, pain control. Lipase trending down.  Tolerating clear liquids for now.   2. Hyponatremia-mild secondary to alcohol abuse and dehydration. - improved w/ IV fluid hydration and will monitor.   3. Alcohol abuse - cont. CIWA  and no evidence of withdrawal.   4. GERD/hx of Hiatal Hernia with repair - cont. Protonix.      All the records are reviewed and case discussed with Care Management/Social Worker. Management plans discussed with the patient, family and they are in agreement.  CODE STATUS: Full code  DVT Prophylaxis: Lovenox  TOTAL TIME TAKING CARE OF THIS PATIENT: 30 minutes.   POSSIBLE D/C IN 1-2 DAYS, DEPENDING ON CLINICAL CONDITION.   Houston Siren M.D on 05/02/2017 at 2:23 PM  Between 7am to 6pm - Pager - [phone]  After 6pm go to [email or web address] - Social research officer, government  [facility] [place] [facility]  Office  [phone]  CC: Primary care physician; Patient, No Pcp Per

## 2017-05-03 DIAGNOSIS — K859 Acute pancreatitis without necrosis or infection, unspecified: Secondary | ICD-10-CM | POA: Diagnosis present

## 2017-05-03 LAB — CBC
HCT: 40.6 % (ref 40.0–52.0)
Hemoglobin: 13.8 g/dL (ref 13.0–18.0)
MCH: 30.1 pg (ref 26.0–34.0)
MCHC: 34 g/dL (ref 32.0–36.0)
MCV: 88.5 fL (ref 80.0–100.0)
PLATELETS: 118 10*3/uL — AB (ref 150–440)
RBC: 4.59 MIL/uL (ref 4.40–5.90)
RDW: 14 % (ref 11.5–14.5)
WBC: 17.7 10*3/uL — ABNORMAL HIGH (ref 3.8–10.6)

## 2017-05-03 LAB — BASIC METABOLIC PANEL
Anion gap: 5 (ref 5–15)
BUN: 6 mg/dL (ref 6–20)
CO2: 26 mmol/L (ref 22–32)
CREATININE: 0.56 mg/dL — AB (ref 0.61–1.24)
Calcium: 8.4 mg/dL — ABNORMAL LOW (ref 8.9–10.3)
Chloride: 104 mmol/L (ref 101–111)
Glucose, Bld: 108 mg/dL — ABNORMAL HIGH (ref 65–99)
POTASSIUM: 4.2 mmol/L (ref 3.5–5.1)
SODIUM: 135 mmol/L (ref 135–145)

## 2017-05-03 LAB — LIPASE, BLOOD: Lipase: 60 U/L — ABNORMAL HIGH (ref 11–51)

## 2017-05-03 LAB — HIV ANTIBODY (ROUTINE TESTING W REFLEX): HIV SCREEN 4TH GENERATION: NONREACTIVE

## 2017-05-03 MED ORDER — MORPHINE SULFATE (PF) 2 MG/ML IV SOLN: 2.0000 mg | Freq: Four times a day (QID) | INTRAVENOUS | Status: DC | PRN

## 2017-05-03 MED ORDER — HYDROMORPHONE HCL 1 MG/ML IJ SOLN
1.0000 mg | INTRAMUSCULAR | Status: DC | PRN
  Administered 2017-05-03: 1 mg via INTRAVENOUS
  Filled 2017-05-03: qty 1

## 2017-05-03 MED ORDER — OXYCODONE-ACETAMINOPHEN 7.5-325 MG PO TABS
1.0000 | ORAL_TABLET | ORAL | Status: DC | PRN
  Administered 2017-05-03 – 2017-05-04 (×4): 1 via ORAL
  Filled 2017-05-03 (×4): qty 1

## 2017-05-03 NOTE — Progress Notes (Signed)
[facility] [place] at [facility]   PATIENT NAME: Lee Sparks    MR#:  [id number]  DATE OF BIRTH:  1970-12-16  SUBJECTIVE:   Pt. Here due to abdominal pain due to acute pancreatitis. Still having some mild epigastric abdominal pain. No nausea or vomiting. Lipase has normalized. No evidence of alcohol withdrawal presently.  REVIEW OF SYSTEMS:    Review of Systems  Constitutional: Negative for chills and fever.  HENT: Negative for congestion and tinnitus.   Eyes: Negative for blurred vision and double vision.  Respiratory: Negative for cough, shortness of breath and wheezing.   Cardiovascular: Negative for chest pain, orthopnea and PND.  Gastrointestinal: Positive for abdominal pain and nausea. Negative for diarrhea and vomiting.  Genitourinary: Negative for dysuria and hematuria.  Neurological: Negative for dizziness, sensory change and focal weakness.  All other systems reviewed and are negative.   Nutrition: Clear liquids Tolerating Diet: Yes Tolerating PT: Ambulatory   DRUG ALLERGIES:   Allergies  Allergen Reactions  . Grapefruit Diet Or [Extra Strength Grapefruit] Hives    Pt states he has a reaction to grapefruit.     VITALS:  Blood pressure 126/73, pulse (!) 116, temperature 99.8 F (37.7 C), temperature source Oral, resp. rate 18, height  (1.727 m), weight 85.3 kg (188 lb), SpO2 94 %.  PHYSICAL EXAMINATION:   Physical Exam  GENERAL:  46 y.o.-year-old patient lying in bed in no acute distress.  EYES: Pupils equal, round, reactive to light and accommodation. No scleral icterus. Extraocular muscles intact.  HEENT: Head atraumatic, normocephalic. Oropharynx and nasopharynx clear.  NECK:  Supple, no jugular venous distention. No thyroid enlargement, no tenderness.  LUNGS: Normal breath sounds bilaterally, no wheezing, rales, rhonchi. No use of accessory muscles of respiration.  CARDIOVASCULAR: S1, S2 normal. No murmurs, rubs, or gallops.   ABDOMEN: Soft, mild Epigastric/left upper quadrant abdominal pain but no rebound or rigidity, nondistended. Bowel sounds present. No organomegaly or mass.  EXTREMITIES: No cyanosis, clubbing or edema b/l.    NEUROLOGIC: Cranial nerves II through XII are intact. No focal Motor or sensory deficits b/l.   PSYCHIATRIC: The patient is alert and oriented x 3.  SKIN: No obvious rash, lesion, or ulcer.    LABORATORY PANEL:   CBC  Recent Labs Lab 05/03/17 0351  WBC 17.7*  HGB 13.8  HCT 40.6  PLT 118*   ------------------------------------------------------------------------------------------------------------------  Chemistries   Recent Labs Lab 05/02/17 0402 05/03/17 0351  NA 137 135  K 4.5 4.2  CL 106 104  CO2 26 26  GLUCOSE 118* 108*  BUN 6 6  CREATININE 0.79 0.56*  CALCIUM 8.8* 8.4*  AST 26  --   ALT 29  --   ALKPHOS 39  --   BILITOT 1.2  --    ------------------------------------------------------------------------------------------------------------------  Cardiac Enzymes No results for input(s): TROPONINI in the last 168 hours. ------------------------------------------------------------------------------------------------------------------  RADIOLOGY:  Ct Abdomen Pelvis W Contrast  Result Date: 05/01/2017 CLINICAL DATA:  46 y/o M; left upper quadrant abdominal pain for 1 day. EXAM: CT ABDOMEN AND PELVIS WITH CONTRAST TECHNIQUE: Multidetector CT imaging of the abdomen and pelvis was performed using the standard protocol following bolus administration of intravenous contrast. CONTRAST:  ISOVUE-300 IOPAMIDOL (ISOVUE-300) INJECTION 61% COMPARISON:  None. FINDINGS: Lower chest: No acute abnormality. Hepatobiliary: No focal liver abnormality is seen. No gallstones, gallbladder wall thickening, or biliary dilatation. Pancreas: Moderate edema surrounding the pancreatic body and tail in the left upper quadrant likely representing acute pancreatitis. No  rim enhancing  collection or evidence for pancreatic necrosis. No pancreatic ductal dilatation. Spleen: Normal in size without focal abnormality. Adrenals/Urinary Tract: Adrenal glands are unremarkable. Kidneys are normal, without renal calculi, focal lesion, or hydronephrosis. Bladder is unremarkable. Stomach/Bowel: Stomach is within normal limits. Appendix appears normal. No evidence of bowel wall thickening, distention, or inflammatory changes. Vascular/Lymphatic: No significant vascular findings are present. No enlarged abdominal or pelvic lymph nodes. Reproductive: Prostate is unremarkable. Other: Ventral midline upper abdominal wall hernia containing fat with 0.9 cm neck 10 cm superior to umbilicus. Musculoskeletal: L5-S1 grade 1 anterolisthesis and chronic appearing right-sided L5 pars defect. Minimal L4-5 retrolisthesis. IMPRESSION: 1. Acute pancreatitis involving body and tail with surrounding edema. No acute peripancreatic collection or evidence for pancreatic necrosis. No pancreatic ductal dilatation. 2. Small midline ventral upper abdominal wall hernia containing fat. 3. L5-S1 grade 1 anterolisthesis and chronic right-sided L5 pars defect. Electronically Signed   By: Mitzi Hansen M.D.   On: 05/01/2017 15:22     ASSESSMENT AND PLAN:   46 year old male with past medical history of alcohol abuse, history of hiatal hernia, GERD who presents to the hospital due to left upper quadrant abdominal pain, nausea and noted to have clinical findings and CT scan findings suggestive of acute pancreatitis.  1. Acute pancreatitis-this is acute alcoholic pancreatitis. Patient does drink about 6-8 beers daily. Patient CT scan was consistent with acute pancreatitis involving the body and tail. - cont. Supportive care with IV fluids, antiemetics, pain control. Lipase trending down.  Tolerating clear liquids and will advance to full liquids.    - will d/c Morphine for pain control, cont. Dilaudid and add some PRN  Percocet.   2. Hyponatremia-mild secondary to alcohol abuse and dehydration. - resolved now with IV fluids.   3. Alcohol abuse - cont. CIWA and no evidence of withdrawal.   4. GERD/hx of Hiatal Hernia with repair - cont. Protonix.   Possible d/c home tomorrow.   All the records are reviewed and case discussed with Care Management/Social Worker. Management plans discussed with the patient, family and they are in agreement.  CODE STATUS: Full code  DVT Prophylaxis: Lovenox  TOTAL TIME TAKING CARE OF THIS PATIENT: 30 minutes.   POSSIBLE D/C IN 1-2 DAYS, DEPENDING ON CLINICAL CONDITION.   Houston Siren M.D on 05/03/2017 at 1:28 PM  Between 7am to 6pm - Pager - [phone]  After 6pm go to [email or web address] - Social research officer, government  [facility]  Office  [phone]  CC: Primary care physician; Patient, No Pcp Per

## 2017-05-04 MED ORDER — OXYCODONE-ACETAMINOPHEN 7.5-325 MG PO TABS: 1.0000 | ORAL_TABLET | Freq: Four times a day (QID) | ORAL | 0 refills | Status: DC | PRN

## 2017-05-04 MED ORDER — ONDANSETRON HCL 4 MG PO TABS: 4.0000 mg | ORAL_TABLET | Freq: Four times a day (QID) | ORAL | 0 refills | Status: DC | PRN

## 2017-05-04 MED ORDER — LACTULOSE 10 GM/15ML PO SOLN
30.0000 g | Freq: Every day | ORAL | Status: DC | PRN
  Administered 2017-05-04: 30 g via ORAL
  Filled 2017-05-04: qty 60

## 2017-05-04 NOTE — Discharge Summary (Signed)
[facility] - [place] at [facility]   PATIENT NAME: Lee Sparks    MR#:  [id number]  DATE OF BIRTH:  08/04/1970  DATE OF ADMISSION:  05/01/2017 ADMITTING PHYSICIAN: Houston Siren, MD  DATE OF DISCHARGE: 05/04/2017  PRIMARY CARE PHYSICIAN: Patient, No Pcp Per    ADMISSION DIAGNOSIS:  Alcohol abuse [F10.10] Hyponatremia [E87.1] Alcohol-induced acute pancreatitis, unspecified complication status [K85.20]  DISCHARGE DIAGNOSIS:  Active Problems:   Acute pancreatitis   Pancreatitis   SECONDARY DIAGNOSIS:   Past Medical History:  Diagnosis Date  . Anemia     HOSPITAL COURSE:   46 year old male with past medical history of alcohol abuse, history of hiatal hernia, GERD who presents to the hospital due to left upper quadrant abdominal pain, nausea and noted to have clinical findings and CT scan findings suggestive of acute pancreatitis.  1. Acute pancreatitis-this was acute alcoholic pancreatitis. Patient does drink about 6-8 beers daily. Patient CT scan was consistent with acute pancreatitis involving the body and tail. -Patient was admitted to the hospital due to supportively with IV fluids, antibiotics and pain control. Patient's lipase level has improved, his diet was slowly advanced from clear to a fall and not to a low-fat diet which is not tolerating well. His pain is being well controlled on some oral medications. Patient is being discharged home on oral Percocet and nausea medications. He was strongly advised to abstain from alcohol.  2. Hyponatremia-mild secondary to alcohol abuse and dehydration. -This is improved and resolved with IV fluids.  3. Alcohol abuse -was on CIWA while in hospital but no evidence of withdrawal presently.   4. GERD/hx of Hiatal Hernia with repair - pt. Will cont. Protonix.   DISCHARGE CONDITIONS:   Stable.   CONSULTS OBTAINED:    DRUG ALLERGIES:   Allergies  Allergen Reactions  . Grapefruit Diet Or [Extra  Strength Grapefruit] Hives    Pt states he has a reaction to grapefruit.     DISCHARGE MEDICATIONS:   Allergies as of 05/04/2017      Reactions   Grapefruit Diet Or [extra Strength Grapefruit] Hives   Pt states he has a reaction to grapefruit.       Medication List    TAKE these medications   ondansetron 4 MG tablet Commonly known as:  ZOFRAN Take 1 tablet (4 mg total) by mouth every 6 (six) hours as needed for nausea.   oxyCODONE-acetaminophen 7.5-325 MG tablet Commonly known as:  PERCOCET Take 1 tablet by mouth every 6 (six) hours as needed for moderate pain.   pantoprazole 40 MG tablet Commonly known as:  PROTONIX Take 1 tablet (40 mg total) by mouth 2 (two) times daily before a meal.         DISCHARGE INSTRUCTIONS:   DIET:  Low fat, Low cholesterol diet  DISCHARGE CONDITION:  Stable  ACTIVITY:  Activity as tolerated  OXYGEN:  Home Oxygen: No.   Oxygen Delivery: room air  DISCHARGE LOCATION:  home   If you experience worsening of your admission symptoms, develop shortness of breath, life threatening emergency, suicidal or homicidal thoughts you must seek medical attention immediately by calling 911 or calling your MD immediately  if symptoms less severe.  You Must read complete instructions/literature along with all the possible adverse reactions/side effects for all the Medicines you take and that have been prescribed to you. Take any new Medicines after you have completely understood and accpet all the possible adverse reactions/side effects.   Please note  You were cared for by a hospitalist during your hospital stay. If you have any questions about your discharge medications or the care you received while you were in the hospital after you are discharged, you can call the unit and asked to speak with the hospitalist on call if the hospitalist that took care of you is not available. Once you are discharged, your primary care physician will handle any  further medical issues. Please note that NO REFILLS for any discharge medications will be authorized once you are discharged, as it is imperative that you return to your primary care physician (or establish a relationship with a primary care physician if you do not have one) for your aftercare needs so that they can reassess your need for medications and monitor your lab values.     Today   Still having some abdominal pain, but no N/V. No other acute events overnight. Will advance to low fat diet and if tolerating it well then d/c home later today.   VITAL SIGNS:  Blood pressure 134/82, pulse (!) 106, temperature 98.9 F (37.2 C), temperature source Oral, resp. rate 20, height  (1.727 m), weight 85.3 kg (188 lb), SpO2 93 %.  I/O:   Intake/Output Summary (Last 24 hours) at 05/04/17 1428 Last data filed at 05/04/17 1400  Gross per 24 hour  Intake          2546.42 ml  Output              702 ml  Net          1844.42 ml    PHYSICAL EXAMINATION:   GENERAL:  46 y.o.-year-old patient lying in bed in no acute distress.  EYES: Pupils equal, round, reactive to light and accommodation. No scleral icterus. Extraocular muscles intact.  HEENT: Head atraumatic, normocephalic. Oropharynx and nasopharynx clear.  NECK:  Supple, no jugular venous distention. No thyroid enlargement, no tenderness.  LUNGS: Normal breath sounds bilaterally, no wheezing, rales, rhonchi. No use of accessory muscles of respiration.  CARDIOVASCULAR: S1, S2 normal. No murmurs, rubs, or gallops.  ABDOMEN: Soft, mild Epigastric/left upper quadrant abdominal pain but no rebound or rigidity, nondistended. Bowel sounds present. No organomegaly or mass.  EXTREMITIES: No cyanosis, clubbing or edema b/l.    NEUROLOGIC: Cranial nerves II through XII are intact. No focal Motor or sensory deficits b/l.   PSYCHIATRIC: The patient is alert and oriented x 3.  SKIN: No obvious rash, lesion, or ulcer.   DATA REVIEW:   CBC  Recent  Labs Lab 05/03/17 0351  WBC 17.7*  HGB 13.8  HCT 40.6  PLT 118*    Chemistries   Recent Labs Lab 05/02/17 0402 05/03/17 0351  NA 137 135  K 4.5 4.2  CL 106 104  CO2 26 26  GLUCOSE 118* 108*  BUN 6 6  CREATININE 0.79 0.56*  CALCIUM 8.8* 8.4*  AST 26  --   ALT 29  --   ALKPHOS 39  --   BILITOT 1.2  --     Cardiac Enzymes No results for input(s): TROPONINI in the last 168 hours.  Microbiology Results  No results found for this or any previous visit.  RADIOLOGY:  No results found.    Management plans discussed with the patient, family and they are in agreement.  CODE STATUS:     Code Status Orders        Start     Ordered   05/01/17 1703  Full code  Continuous  05/01/17 1703    Code Status History    Date Active Date Inactive Code Status Order ID Comments User Context   This patient has a current code status but no historical code status.      TOTAL TIME TAKING CARE OF THIS PATIENT: 40 minutes.    Houston Siren M.D on 05/04/2017 at 2:28 PM  Between 7am to 6pm - Pager - [phone]  After 6pm go to [email or web address] - Social research officer, government  [facility]  Office  [phone]  CC: Primary care physician; Patient, No Pcp Per

## 2017-05-04 NOTE — Care Management Note (Signed)
Case Management Note  Patient Details  Name: TYREK LAWHORN MRN: [id number] Date of Birth: Nov 15, 1970  Subjective/Objective: Application given for [facility] medication managment clinic.Referal sent to both agencies. No further needs identified.    Action/Plan:   Expected Discharge Date:  05/04/17               Expected Discharge Plan:  Home/Self Care  In-House Referral:     Discharge planning Services  CM Consult, Indigent Health Clinic, Medication Assistance  Post Acute Care Choice:    Choice offered to:  Patient  DME Arranged:    DME Agency:     HH Arranged:    HH Agency:     Status of Service:  Completed, signed off  If discussed at [employer], dates discussed:    Additional Comments:  Marily Memos, RN 05/04/2017, 12:22 PM

## 2017-05-04 NOTE — Progress Notes (Signed)
Discharge instructions and medication details reviewed with patient. Printed prescription for percocet and zofran given to patient along with printed AVS. All questions answered. Education about fatty foods and pancreatitis provided to patient and he verbalizes understanding.  IV removed. Patient escorted out via wheelchair.  Stephannie Peters, RN

## 2017-06-17 ENCOUNTER — Encounter: Payer: Self-pay | Admitting: Emergency Medicine

## 2017-06-17 ENCOUNTER — Emergency Department
Admission: EM | Admit: 2017-06-17 | Discharge: 2017-06-17 | Disposition: A | Discharge status: Home / Self Care | Payer: Self-pay | Attending: Emergency Medicine | Admitting: Emergency Medicine

## 2017-06-17 DIAGNOSIS — Z87891 Personal history of nicotine dependence: Secondary | ICD-10-CM | POA: Insufficient documentation

## 2017-06-17 DIAGNOSIS — J0140 Acute pansinusitis, unspecified: Secondary | ICD-10-CM | POA: Insufficient documentation

## 2017-06-17 DIAGNOSIS — I10 Essential (primary) hypertension: Secondary | ICD-10-CM | POA: Insufficient documentation

## 2017-06-17 DIAGNOSIS — J209 Acute bronchitis, unspecified: Secondary | ICD-10-CM | POA: Insufficient documentation

## 2017-06-17 MED ORDER — AZITHROMYCIN 250 MG PO TABS: ORAL_TABLET | ORAL | 0 refills | Status: DC

## 2017-06-17 MED ORDER — PREDNISONE 10 MG PO TABS: 50.0000 mg | ORAL_TABLET | Freq: Every day | ORAL | 0 refills | Status: DC

## 2017-06-17 MED ORDER — GUAIFENESIN-CODEINE 100-10 MG/5ML PO SOLN: 10.0000 mL | Freq: Three times a day (TID) | ORAL | 0 refills | Status: DC | PRN

## 2017-06-17 MED ORDER — IPRATROPIUM-ALBUTEROL 0.5-2.5 (3) MG/3ML IN SOLN
3.0000 mL | Freq: Once | RESPIRATORY_TRACT | Status: AC
  Administered 2017-06-17: 3 mL via RESPIRATORY_TRACT
  Filled 2017-06-17: qty 3

## 2017-06-17 MED ORDER — ALBUTEROL SULFATE HFA 108 (90 BASE) MCG/ACT IN AERS: 2.0000 | INHALATION_SPRAY | RESPIRATORY_TRACT | 1 refills | Status: DC | PRN

## 2017-06-17 NOTE — ED Provider Notes (Signed)
[facility] Emergency Department Provider Note  ____________________________________________  Time seen: Approximately 6:07 PM  I have reviewed the triage vital signs and the nursing notes.   HISTORY  Chief Complaint URI   HPI Theola Sequinnthony L Woolford is a 46 y.o. male who presents to the emergency department for evaluation and treatment of cough, wheezing, and sinus pressure that has been present for the past 5 days.  He states symptoms are getting worse every day.  He denies fever.  He has no chronic health problems.  He has no known drug allergies.  He takes no medications on a daily basis.  He states that multiple people at his job have also been sick with similar symptoms.  Past Medical History:  Diagnosis Date  . Anemia     Patient Active Problem List   Diagnosis Date Noted  . Pancreatitis 05/03/2017  . Acute pancreatitis 05/01/2017  . Thrombocytopenia (HCC) 07/29/2011  . Cocaine abuse (HCC) 07/28/2011  . Upper GI bleeding 07/27/2011  . Tachycardia 07/27/2011    Class: Acute  . Hypertension 07/27/2011    Class: Acute  . Alcohol withdrawal syndrome (HCC) 07/27/2011    Class: Acute  . Abdominal pain 07/27/2011    Class: Acute    Past Surgical History:  Procedure Laterality Date  . CYSTECTOMY    . ESOPHAGOGASTRODUODENOSCOPY  07/28/2011   Procedure: ESOPHAGOGASTRODUODENOSCOPY (EGD);  Surgeon: Freddy JakschWilliam M Outlaw, MD;  [facility];  Service: Endoscopy;  Laterality: N/A;  . HERNIA REPAIR      Prior to Admission medications   Medication Sig Start Date End Date Taking? Authorizing Provider  albuterol (PROVENTIL HFA;VENTOLIN HFA) 108 (90 Base) MCG/ACT inhaler Inhale 2 puffs into the lungs every 4 (four) hours as needed for wheezing or shortness of breath. 06/17/17   Tambria Pfannenstiel, Rulon Eisenmengerari B, FNP  azithromycin (ZITHROMAX) 250 MG tablet 2 tablets today, then 1 tablet for the next 4 days. 06/17/17   Savana Spina B, FNP  guaiFENesin-codeine 100-10 MG/5ML  syrup Take 10 mLs by mouth 3 (three) times daily as needed. 06/17/17   Vaani Morren B, FNP  ondansetron (ZOFRAN) 4 MG tablet Take 1 tablet (4 mg total) by mouth every 6 (six) hours as needed for nausea. 05/04/17   Houston SirenSainani, Vivek J, MD  oxyCODONE-acetaminophen (PERCOCET) 7.5-325 MG tablet Take 1 tablet by mouth every 6 (six) hours as needed for moderate pain. 05/04/17   Houston SirenSainani, Vivek J, MD  pantoprazole (PROTONIX) 40 MG tablet Take 1 tablet (40 mg total) by mouth 2 (two) times daily before a meal. 08/01/11 07/31/12  Leroy SeaSingh, Prashant K, MD  predniSONE (DELTASONE) 10 MG tablet Take 5 tablets (50 mg total) by mouth daily. 06/17/17   Chinita Pesterriplett, Tniya Bowditch B, FNP    Allergies Grapefruit diet or [extra strength grapefruit]  Family History  Problem Relation Age of Onset  . Hypertension Father   . Aneurysm Father   . Cancer Other   . Hypertension Mother   . Pneumonia Mother     Social History Social History   Tobacco Use  . Smoking status: Former Smoker    Packs/day: 0.03    Years: 15.00    Pack years: 0.45    Types: Cigarettes    Last attempt to quit: 06/27/2011    Years since quitting: 5.9  . Smokeless tobacco: Never Used  Substance Use Topics  . Alcohol use: Yes    Alcohol/week: 30.0 oz    Types: 50 Cans of beer per week    Comment: Equivalent of  two 40 oz beers daily.  . Drug use: No    Review of Systems Constitutional: Negative for fever/chills ENT: Positive for sore throat. Cardiovascular: Denies chest pain. Respiratory: Negative for shortness of breath.  Positive for cough. Gastrointestinal: Negative for nausea, no vomiting.  No diarrhea.  Musculoskeletal: Negative for body aches Skin: Negative for rash. Neurological: Positive for headaches ____________________________________________   PHYSICAL EXAM:  VITAL SIGNS: ED Triage Vitals [06/17/17 1753]  Enc Vitals Group     BP (!) 159/97     Pulse Rate 99     Resp 17     Temp 97.9 F (36.6 C)     Temp Source Oral     SpO2  100 %     Weight 178 lb (80.7 kg)     Height 5\' 9"  (1.753 m)     Head Circumference      Peak Flow      Pain Score 3     Pain Loc      Pain Edu?      Excl. in GC?     Constitutional: Alert and oriented.  Acutely ill appearing and in no acute distress. Eyes: Conjunctivae are normal. EOMI. Ears: Bilateral TMs are injected Nose: Sinus congestion noted; no rhinnorhea. Mouth/Throat: Mucous membranes are moist.  Oropharynx clear. Tonsils not visualized. Neck: No stridor.  Lymphatic: No cervical lymphadenopathy. Cardiovascular: Normal rate, regular rhythm. Good peripheral circulation. Respiratory: Normal respiratory effort.  No retractions.  Scattered wheezing and rhonchi present throughout.. Gastrointestinal: Soft and nontender.  Musculoskeletal: FROM x 4 extremities.  Neurologic:  Normal speech and language.  Skin:  Skin is warm, dry and intact. No rash noted. Psychiatric: Mood and affect are normal. Speech and behavior are normal.  ____________________________________________   LABS (all labs ordered are listed, but only abnormal results are displayed)  Labs Reviewed - No data to display ____________________________________________  EKG  Not indicated ____________________________________________  RADIOLOGY  Not indicated ____________________________________________   PROCEDURES  Procedure(s) performed: None  Critical Care performed: No ____________________________________________   INITIAL IMPRESSION / ASSESSMENT AND PLAN / ED COURSE  46 year old male presenting to the emergency department for treatment and evaluation of URI symptoms.  Patient is a cigarette smoker.  Exam consistent with acute bronchitis and sinusitis.  He will be given a prescription for azithromycin, prednisone, albuterol, and guaifenesin with codeine.  He is to follow-up with the primary care provider for choice for symptoms that are not improving over the next few days.  He was instructed to  return to the emergency department for symptoms of change or worsening of his unable to schedule appointment.  Pertinent labs & imaging results that were available during my care of the patient were reviewed by me and considered in my medical decision making (see chart for details).  This SmartLink is deprecated. Use AVSMEDLIST instead to display the medication list for a patient.  If controlled substance prescribed during this visit, 12 month history viewed on the [facility] prior to issuing an initial prescription for Schedule II or III opiod. ____________________________________________   FINAL CLINICAL IMPRESSION(S) / ED DIAGNOSES  Final diagnoses:  Acute bronchitis, unspecified organism  Acute non-recurrent pansinusitis    Note:  This document was prepared using Dragon voice recognition software and may include unintentional dictation errors.     Chinita Pesterriplett, Rayetta Veith B, FNP 06/17/17 1836    Phineas SemenGoodman, Gray[place], MD 06/18/17 [phone]

## 2017-06-17 NOTE — ED Triage Notes (Signed)
Pt comes into the ED via POV c/o upper respiratory problems including chest congestion, nasal congestion, and cough.  Patient states it gets worse at night and he has yellow mucous coming up.  Patient in NAD at this time with even and unlabored respirations and denies any fevers at home.

## 2017-06-22 ENCOUNTER — Encounter: Payer: Self-pay | Admitting: Emergency Medicine

## 2017-06-22 ENCOUNTER — Emergency Department
Admission: EM | Admit: 2017-06-22 | Discharge: 2017-06-22 | Disposition: A | Discharge status: Home / Self Care | Payer: Self-pay | Attending: Emergency Medicine | Admitting: Emergency Medicine

## 2017-06-22 ENCOUNTER — Emergency Department: Payer: Self-pay

## 2017-06-22 DIAGNOSIS — J209 Acute bronchitis, unspecified: Secondary | ICD-10-CM | POA: Insufficient documentation

## 2017-06-22 DIAGNOSIS — Z79899 Other long term (current) drug therapy: Secondary | ICD-10-CM | POA: Insufficient documentation

## 2017-06-22 DIAGNOSIS — I10 Essential (primary) hypertension: Secondary | ICD-10-CM | POA: Insufficient documentation

## 2017-06-22 DIAGNOSIS — J40 Bronchitis, not specified as acute or chronic: Secondary | ICD-10-CM

## 2017-06-22 DIAGNOSIS — Z87891 Personal history of nicotine dependence: Secondary | ICD-10-CM | POA: Insufficient documentation

## 2017-06-22 MED ORDER — BENZONATATE 200 MG PO CAPS: 200.0000 mg | ORAL_CAPSULE | Freq: Three times a day (TID) | ORAL | 0 refills | Status: DC | PRN

## 2017-06-22 MED ORDER — IPRATROPIUM-ALBUTEROL 0.5-2.5 (3) MG/3ML IN SOLN
3.0000 mL | Freq: Once | RESPIRATORY_TRACT | Status: AC
  Administered 2017-06-22: 3 mL via RESPIRATORY_TRACT
  Filled 2017-06-22: qty 3

## 2017-06-22 MED ORDER — LEVOFLOXACIN 500 MG PO TABS: 500.0000 mg | ORAL_TABLET | Freq: Every day | ORAL | 0 refills | Status: DC

## 2017-06-22 NOTE — ED Notes (Signed)
Pt reports headache, cough, congestion, weakness - pt was seen last Wednesday and told if no improvement to return

## 2017-06-22 NOTE — ED Provider Notes (Signed)
[facility] Emergency Department Provider Note  ____________________________________________   First MD Initiated Contact with Patient 06/22/17 1449     (approximate)  I have reviewed the triage vital signs and the nursing notes.   HISTORY  Chief Complaint URI    HPI Lee Sparks is a 46 y.o. male complains of continued cough, states it's all chest congestion, states that this is brown, complains of some shortness of breath on occasion, this is mostly associated with the cough, denies chest pain, denies fever or chills, is a smoker normally half pack a day, took his last Zithromax today, finished a steroid pack and states he does not feel any better   Past Medical History:  Diagnosis Date  . Anemia     Patient Active Problem List   Diagnosis Date Noted  . Pancreatitis 05/03/2017  . Acute pancreatitis 05/01/2017  . Thrombocytopenia (HCC) 07/29/2011  . Cocaine abuse (HCC) 07/28/2011  . Upper GI bleeding 07/27/2011  . Tachycardia 07/27/2011    Class: Acute  . Hypertension 07/27/2011    Class: Acute  . Alcohol withdrawal syndrome (HCC) 07/27/2011    Class: Acute  . Abdominal pain 07/27/2011    Class: Acute    Past Surgical History:  Procedure Laterality Date  . CYSTECTOMY    . ESOPHAGOGASTRODUODENOSCOPY  07/28/2011   Procedure: ESOPHAGOGASTRODUODENOSCOPY (EGD);  Surgeon: Freddy JakschWilliam M Outlaw, MD;  [facility];  Service: Endoscopy;  Laterality: N/A;  . HERNIA REPAIR      Prior to Admission medications   Medication Sig Start Date End Date Taking? Authorizing Provider  albuterol (PROVENTIL HFA;VENTOLIN HFA) 108 (90 Base) MCG/ACT inhaler Inhale 2 puffs into the lungs every 4 (four) hours as needed for wheezing or shortness of breath. 06/17/17   Triplett, Rulon Eisenmengerari B, FNP  azithromycin (ZITHROMAX) 250 MG tablet 2 tablets today, then 1 tablet for the next 4 days. 06/17/17   Triplett, Cari B, FNP  guaiFENesin-codeine 100-10 MG/5ML syrup Take  10 mLs by mouth 3 (three) times daily as needed. 06/17/17   Triplett, Cari B, FNP  ondansetron (ZOFRAN) 4 MG tablet Take 1 tablet (4 mg total) by mouth every 6 (six) hours as needed for nausea. 05/04/17   Houston SirenSainani, Vivek J, MD  oxyCODONE-acetaminophen (PERCOCET) 7.5-325 MG tablet Take 1 tablet by mouth every 6 (six) hours as needed for moderate pain. 05/04/17   Houston SirenSainani, Vivek J, MD  pantoprazole (PROTONIX) 40 MG tablet Take 1 tablet (40 mg total) by mouth 2 (two) times daily before a meal. 08/01/11 07/31/12  Leroy SeaSingh, Prashant K, MD  predniSONE (DELTASONE) 10 MG tablet Take 5 tablets (50 mg total) by mouth daily. 06/17/17   Chinita Pesterriplett, Cari B, FNP    Allergies Grapefruit diet or [extra strength grapefruit]  Family History  Problem Relation Age of Onset  . Hypertension Father   . Aneurysm Father   . Cancer Other   . Hypertension Mother   . Pneumonia Mother     Social History Social History   Tobacco Use  . Smoking status: Former Smoker    Packs/day: 0.03    Years: 15.00    Pack years: 0.45    Types: Cigarettes    Last attempt to quit: 06/27/2011    Years since quitting: 5.9  . Smokeless tobacco: Never Used  Substance Use Topics  . Alcohol use: Yes    Alcohol/week: 30.0 oz    Types: 50 Cans of beer per week    Comment: Equivalent of two 40 oz beers  daily.  . Drug use: No    Review of Systems  Constitutional: No fever/chills Eyes: No visual changes. ENT: No sore throat. Respiratory: Positive cough Genitourinary: Negative for dysuria. Musculoskeletal: Negative for back pain. Skin: Negative for rash.    ____________________________________________   PHYSICAL EXAM:  VITAL SIGNS: ED Triage Vitals  Enc Vitals Group     BP 06/22/17 1410 135/87     Pulse Rate 06/22/17 1410 (!) 102     Resp 06/22/17 1410 18     Temp 06/22/17 1410 98.1 F (36.7 C)     Temp Source 06/22/17 1410 Oral     SpO2 --      Weight --      Height --      Head Circumference --      Peak Flow --       Pain Score 06/22/17 1448 2     Pain Loc --      Pain Edu? --      Excl. in GC? --     Constitutional: Alert and oriented. Well appearing and in no acute distress. Eyes: Conjunctivae are normal.  Head: Atraumatic. Nose: No congestion/rhinnorhea. Mouth/Throat: Mucous membranes are moist.   Cardiovascular: Normal rate, regular rhythm. Respiratory: Normal respiratory effort.  No retractions, some crackles in the right anterior chest GU: deferred Musculoskeletal: FROM all extremities, warm and well perfused Neurologic:  Normal speech and language.  Skin:  Skin is warm, dry and intact. No rash noted. Psychiatric: Mood and affect are normal. Speech and behavior are normal.  ____________________________________________   LABS (all labs ordered are listed, but only abnormal results are displayed)  Labs Reviewed - No data to display ____________________________________________   ____________________________________________  RADIOLOGY  cxr is negative  ____________________________________________   PROCEDURES  Procedure(s) performed: Duo neb given      ____________________________________________   INITIAL IMPRESSION / ASSESSMENT AND PLAN / ED COURSE  Pertinent labs & imaging results that were available during my care of the patient were reviewed by me and considered in my medical decision making (see chart for details).  Patient is a 46 year old male but appears well, is still coughing, is mucus is still brown, finished a Z-Pak today, is concerned because he is not better,  ordered chest x-ray ----------------------------------------- 3:32 PM on 06/22/2017 -----------------------------------------   chest x-ray is normal,  order a DuoNeb ----------------------------------------- 4:04 PM on 06/22/2017 -----------------------------------------  Patient has increased air movement with the DuoNeb, he is still coughing, patient works at [employer] he where he has to wear  a sterile suit in the temperature is very cold, a work note is given as the environment will  increase bronchitis symptoms      ____________________________________________   FINAL CLINICAL IMPRESSION(S) / ED DIAGNOSES  Final diagnoses:  None      NEW MEDICATIONS STARTED DURING THIS VISIT:  This SmartLink is deprecated. Use AVSMEDLIST instead to display the medication list for a patient.   Note:  This document was prepared using Dragon voice recognition software and may include unintentional dictation errors.    Faythe GheeFisher, Zonnie Landen W, PA-C 06/22/17 1605    Jene EveryKinner, Robert, MD 06/23/17 [phone]

## 2017-06-22 NOTE — ED Triage Notes (Signed)
Pt comes into the ED via POV c/o sinus congestion, cough, and upper respiratory symptoms that have been unrelieved.  Patient was seen here last Wednesday and diagnosed with bronchitis.  Patient states he still is not getting any better.  Denies any fevers at home but says he has body aches at night.  Patient was placed on prednisone, azithromycin, and cough suppressant.

## 2017-06-22 NOTE — Discharge Instructions (Addendum)
Follow-up with your regular doctor or the acute care if you are not better in 3-5 days, use the medication as prescribed, if you are getting worse please return to the emergency department, you be provided with an work note due to the environment that she can

## 2017-07-31 ENCOUNTER — Other Ambulatory Visit: Payer: Self-pay

## 2017-07-31 ENCOUNTER — Emergency Department (HOSPITAL_BASED_OUTPATIENT_CLINIC_OR_DEPARTMENT_OTHER): Payer: Self-pay

## 2017-07-31 ENCOUNTER — Encounter (HOSPITAL_BASED_OUTPATIENT_CLINIC_OR_DEPARTMENT_OTHER): Payer: Self-pay | Admitting: *Deleted

## 2017-07-31 ENCOUNTER — Emergency Department (HOSPITAL_BASED_OUTPATIENT_CLINIC_OR_DEPARTMENT_OTHER)
Admission: EM | Admit: 2017-07-31 | Discharge: 2017-07-31 | Disposition: A | Discharge status: Home / Self Care | Payer: Self-pay | Attending: Emergency Medicine | Admitting: Emergency Medicine

## 2017-07-31 DIAGNOSIS — I10 Essential (primary) hypertension: Secondary | ICD-10-CM | POA: Insufficient documentation

## 2017-07-31 DIAGNOSIS — F17210 Nicotine dependence, cigarettes, uncomplicated: Secondary | ICD-10-CM | POA: Insufficient documentation

## 2017-07-31 DIAGNOSIS — Y999 Unspecified external cause status: Secondary | ICD-10-CM | POA: Insufficient documentation

## 2017-07-31 DIAGNOSIS — S39012A Strain of muscle, fascia and tendon of lower back, initial encounter: Secondary | ICD-10-CM | POA: Insufficient documentation

## 2017-07-31 DIAGNOSIS — Y939 Activity, unspecified: Secondary | ICD-10-CM | POA: Insufficient documentation

## 2017-07-31 DIAGNOSIS — Y929 Unspecified place or not applicable: Secondary | ICD-10-CM | POA: Insufficient documentation

## 2017-07-31 DIAGNOSIS — X500XXA Overexertion from strenuous movement or load, initial encounter: Secondary | ICD-10-CM | POA: Insufficient documentation

## 2017-07-31 HISTORY — DX: Alcohol induced acute pancreatitis without necrosis or infection: K85.20

## 2017-07-31 HISTORY — DX: Bronchitis, not specified as acute or chronic: J40

## 2017-07-31 MED ORDER — METHOCARBAMOL 500 MG PO TABS: 500.0000 mg | ORAL_TABLET | Freq: Two times a day (BID) | ORAL | 0 refills | Status: DC

## 2017-07-31 NOTE — ED Triage Notes (Addendum)
Pt c/o lower back pain and neck pain increased  Pain with movt  x 1 day denies injury

## 2017-07-31 NOTE — ED Provider Notes (Signed)
[facility] EMERGENCY DEPARTMENT Provider Note   CSN: [id number] Arrival date & time: 07/31/17  1445     History   Chief Complaint Chief Complaint  Patient presents with  . Back Pain    HPI Lee Sparks is a 47 y.o. male who presents with a 1 day history of right-sided low back pain.  Patient reports he has had back pain in the past, however usually resolves with rest and over-the-counter medication.  Patient does a lot of heavy lifting and bending at the [employer].  He reports his pain is worse with movement.  He denies any rating total, saddle anesthesia.  He is taking Aleve at home without relief.  He also reports a 1 year history of intermittent neck pain and cracking.  He reports he gets occasionally tingling to his bilateral arms when he is working.  He does not have that now.  He took Aleve today around 1 PM.  He denies any recent surgeries, known cancer, history of IVDU, or fevers.  HPI  Past Medical History:  Diagnosis Date  . Alcohol-induced pancreatitis   . Anemia   . Bronchitis     Patient Active Problem List   Diagnosis Date Noted  . Pancreatitis 05/03/2017  . Acute pancreatitis 05/01/2017  . Thrombocytopenia (HCC) 07/29/2011  . Cocaine abuse (HCC) 07/28/2011  . Upper GI bleeding 07/27/2011  . Tachycardia 07/27/2011    Class: Acute  . Hypertension 07/27/2011    Class: Acute  . Alcohol withdrawal syndrome (HCC) 07/27/2011    Class: Acute  . Abdominal pain 07/27/2011    Class: Acute    Past Surgical History:  Procedure Laterality Date  . CYSTECTOMY    . ESOPHAGOGASTRODUODENOSCOPY  07/28/2011   Procedure: ESOPHAGOGASTRODUODENOSCOPY (EGD);  Surgeon: Freddy Jaksch, MD;  Location: [facility] ENDOSCOPY;  Service: Endoscopy;  Laterality: N/A;  . HERNIA REPAIR         Home Medications    Prior to Admission medications   Medication Sig Start Date End Date Taking? Authorizing Provider  albuterol (PROVENTIL HFA;VENTOLIN HFA) 108 (90 Base)  MCG/ACT inhaler Inhale 2 puffs into the lungs every 4 (four) hours as needed for wheezing or shortness of breath. 06/17/17   Triplett, Cari B, FNP  benzonatate (TESSALON) 200 MG capsule Take 1 capsule (200 mg total) by mouth 3 (three) times daily as needed for cough. 06/22/17   Fisher, Roselyn Bering, PA-C  guaiFENesin-codeine 100-10 MG/5ML syrup Take 10 mLs by mouth 3 (three) times daily as needed. 06/17/17   Triplett, Rulon Eisenmenger B, FNP  methocarbamol (ROBAXIN) 500 MG tablet Take 1 tablet (500 mg total) by mouth 2 (two) times daily. 07/31/17   Liron Eissler, Waylan Boga, PA-C  ondansetron (ZOFRAN) 4 MG tablet Take 1 tablet (4 mg total) by mouth every 6 (six) hours as needed for nausea. 05/04/17   Houston Siren, MD  oxyCODONE-acetaminophen (PERCOCET) 7.5-325 MG tablet Take 1 tablet by mouth every 6 (six) hours as needed for moderate pain. 05/04/17   Houston Siren, MD  pantoprazole (PROTONIX) 40 MG tablet Take 1 tablet (40 mg total) by mouth 2 (two) times daily before a meal. 08/01/11 07/31/12  Leroy Sea, MD    Family History Family History  Problem Relation Age of Onset  . Hypertension Father   . Aneurysm Father   . Cancer Other   . Hypertension Mother   . Pneumonia Mother     Social History Social History   Tobacco Use  . Smoking  status: Current Every Day Smoker    Packs/day: 0.50    Years: 15.00    Pack years: 7.50    Types: Cigarettes    Last attempt to quit: 06/27/2011    Years since quitting: 6.0  . Smokeless tobacco: Never Used  Substance Use Topics  . Alcohol use: Yes    Alcohol/week: 30.0 oz    Types: 50 Cans of beer per week    Comment: Equivalent of two 40 oz beers daily.  . Drug use: No     Allergies   Grapefruit diet or [extra strength grapefruit]   Review of Systems Review of Systems  Constitutional: Negative for chills and fever.  HENT: Negative for facial swelling and sore throat.   Respiratory: Negative for shortness of breath.   Cardiovascular: Negative for chest  pain.  Genitourinary: Negative for dysuria.  Musculoskeletal: Positive for back pain and neck pain.  Skin: Negative for rash and wound.  Neurological: Positive for numbness (paresthesia to arms intermittently). Negative for weakness and headaches.  Psychiatric/Behavioral: The patient is not nervous/anxious.      Physical Exam Updated Vital Signs BP (!) 145/94   Pulse (!) 118   Temp 98.3 F (36.8 C) (Oral)   Resp 18   Ht 5\' 9"  (1.753 m)   Wt 79.4 kg (175 lb)   SpO2 100%   BMI 25.84 kg/m   Physical Exam  Constitutional: He appears well-developed and well-nourished. No distress.  HENT:  Head: Normocephalic and atraumatic.  Mouth/Throat: Oropharynx is clear and moist. No oropharyngeal exudate.  Eyes: Conjunctivae are normal. Pupils are equal, round, and reactive to light. Right eye exhibits no discharge. Left eye exhibits no discharge. No scleral icterus.  Neck: Normal range of motion. Neck supple. No thyromegaly present.  Cardiovascular: Regular rhythm, normal heart sounds and intact distal pulses. Exam reveals no gallop and no friction rub.  No murmur heard. Pulmonary/Chest: Effort normal and breath sounds normal. No stridor. No respiratory distress. He has no wheezes. He has no rales.  Musculoskeletal: He exhibits no edema.  No midline tenderness to the cervical thoracic spine, but some midline tenderness to the midline lumbar and right paraspinal 5/5 strength to bilateral upper and lower extremities, equal bilateral grip strength, normal sensation throughout; 2+ patellar reflexes  Lymphadenopathy:    He has no cervical adenopathy.  Neurological: He is alert. Coordination normal.  Skin: Skin is warm and dry. No rash noted. He is not diaphoretic. No pallor.  Psychiatric: He has a normal mood and affect.  Nursing note and vitals reviewed.    ED Treatments / Results  Labs (all labs ordered are listed, but only abnormal results are displayed) Labs Reviewed - No data to  display  EKG  EKG Interpretation None       Radiology Dg Cervical Spine Complete  Result Date: 07/31/2017 CLINICAL DATA:  Chronic cervical spine pain x6 months. EXAM: CERVICAL SPINE - COMPLETE 4+ VIEW COMPARISON:  None. FINDINGS: Intact craniocervical relationship and atlantodental interval. No acute cervical spine fracture or listhesis. Mild disc space narrowing from C4 through C7 with small posterior marginal osteophytes at C5-6 and C6-7 contributing to minimal neural foraminal encroachment bilaterally at C5-6 and on the left at C6-7. No jumped or perched facets. The atlantodental interval is maintained. No splaying of the lateral masses of C1 on C2. IMPRESSION: 1. Mild disc space narrowing C4 through C7 small posterior marginal osteophytes at C5-6 and C6-7 contributing to minimal neural foraminal stenosis bilaterally at C5-6 and on  the left at C6-7. 2. No acute fracture or listhesis. Electronically Signed   By: Tollie Ethavid  Kwon M.D.   On: 07/31/2017 17:30   Dg Lumbar Spine Complete  Result Date: 07/31/2017 CLINICAL DATA:  Acute onset low back pain x3 days. EXAM: LUMBAR SPINE - COMPLETE 4+ VIEW COMPARISON:  05/01/2017 CT FINDINGS: Normal lumbar segmentation. Mild-to-moderate disc space narrowing of the lumbar intervertebral discs, more pronounced at L3-4 and L5-S1. Grade 1 anterolisthesis of L5 on S1 associated with degenerative disc disease at L5-S1 and facet sclerosis. Retrolisthesis of L4 on L5, grade 1. No acute fracture or suspicious osseous lesions. IMPRESSION: 1. 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 Findings are similar in appearance to prior CT from 05/01/2017. 2. Multilevel disc space narrowing of the lumbar spine. 3. No acute osseous abnormality. Electronically Signed   By: Tollie Ethavid  Kwon M.D.   On: 07/31/2017 17:27    Procedures Procedures (including critical care  time)  Medications Ordered in ED Medications - No data to display   Initial Impression / Assessment and Plan / ED Course  I have reviewed the triage vital signs and the nursing notes.  Pertinent labs & imaging results that were available during my care of the patient were reviewed by me and considered in my medical decision making (see chart for details).     Patient with back pain.  Tachycardia suspected to be due from pain.  No neurological deficits and normal neuro exam.  X-ray shows chronic retrolisthesis and anterolisthesis as well as multilevel disc space narrowing in the lumbar spine.  Cervical x-ray shows mild to space narrowing C4-C7 small posterior marginal osteophytes at C5-C6 and C6-C7 contributing to minimal neuroforaminal stenosis bilaterally at C5-C6 and on the left at C6-C7.  No acute fracture or listhesis.  Patient is ambulatory.  No loss of bowel or bladder control.  No concern for cauda equina.  No fever, night sweats, weight loss, h/o cancer, IVDA, no recent procedure to back. No urinary symptoms suggestive of UTI.  Supportive care and return precaution discussed.  Discharge home with Robaxin.  Appears safe for discharge at this time. Follow up as indicated in discharge paperwork.    Final Clinical Impressions(s) / ED Diagnoses   Final diagnoses:  Strain of lumbar region, initial encounter    ED Discharge Orders        Ordered    methocarbamol (ROBAXIN) 500 MG tablet  2 times daily     07/31/17 1856       Emi HolesLaw, Jezabella Schriever M, PA-C 08/01/17 0117    Nira Connardama, Pedro Eduardo, MD 08/01/17 [phone]

## 2017-07-31 NOTE — Discharge Instructions (Signed)
Medications: Robaxin  Treatment: Take Robaxin twice daily as needed for muscle pain or spasms.  Do not drive or operate machinery while taking this medication.  Continue taking Aleve as prescribed over-the-counter.  You can alternate with Tylenol as prescribed over-the-counter as well.  Use heat and ice alternating 20 minutes on, 20 minutes off.  Attempt the exercises and stretches as attached.  Follow-up: Please follow-up with the sports medicine doctor, Dr. Pearletha ForgeHudnall.  He can evaluate and treat you and refer you if you need to see a surgeon eventually.  Please return to the emergency department if you develop any new or worsening symptoms.

## 2017-07-31 NOTE — ED Notes (Signed)
Patient transported to X-ray 

## 2017-08-20 ENCOUNTER — Encounter: Payer: Self-pay | Admitting: Emergency Medicine

## 2017-08-20 ENCOUNTER — Other Ambulatory Visit: Payer: Self-pay

## 2017-08-20 ENCOUNTER — Emergency Department
Admission: EM | Admit: 2017-08-20 | Discharge: 2017-08-20 | Disposition: A | Discharge status: Home / Self Care | Payer: Worker's Compensation | Attending: Student in an Organized Health Care Education/Training Program | Admitting: Student in an Organized Health Care Education/Training Program

## 2017-08-20 DIAGNOSIS — S61210A Laceration without foreign body of right index finger without damage to nail, initial encounter: Secondary | ICD-10-CM | POA: Diagnosis not present

## 2017-08-20 DIAGNOSIS — I10 Essential (primary) hypertension: Secondary | ICD-10-CM | POA: Diagnosis not present

## 2017-08-20 DIAGNOSIS — Y9389 Activity, other specified: Secondary | ICD-10-CM | POA: Insufficient documentation

## 2017-08-20 DIAGNOSIS — Y990 Civilian activity done for income or pay: Secondary | ICD-10-CM | POA: Insufficient documentation

## 2017-08-20 DIAGNOSIS — W260XXA Contact with knife, initial encounter: Secondary | ICD-10-CM | POA: Insufficient documentation

## 2017-08-20 DIAGNOSIS — Z79899 Other long term (current) drug therapy: Secondary | ICD-10-CM | POA: Diagnosis not present

## 2017-08-20 DIAGNOSIS — F17210 Nicotine dependence, cigarettes, uncomplicated: Secondary | ICD-10-CM | POA: Diagnosis not present

## 2017-08-20 DIAGNOSIS — Z23 Encounter for immunization: Secondary | ICD-10-CM | POA: Diagnosis not present

## 2017-08-20 DIAGNOSIS — Y9259 Other trade areas as the place of occurrence of the external cause: Secondary | ICD-10-CM | POA: Diagnosis not present

## 2017-08-20 MED ORDER — CEPHALEXIN 500 MG PO CAPS: 500.0000 mg | ORAL_CAPSULE | Freq: Four times a day (QID) | ORAL | 0 refills | Status: AC

## 2017-08-20 MED ORDER — TETANUS-DIPHTH-ACELL PERTUSSIS 5-2.5-18.5 LF-MCG/0.5 IM SUSP
0.5000 mL | Freq: Once | INTRAMUSCULAR | Status: AC
  Administered 2017-08-20: 0.5 mL via INTRAMUSCULAR
  Filled 2017-08-20: qty 0.5

## 2017-08-20 NOTE — ED Notes (Signed)
I have discovered that all copies of [employer] Workers Comp Urine Screen were placed inside the specimen bag. With exception of employee and employer copies, all others were sealed and sent with lab [employer]

## 2017-08-20 NOTE — ED Triage Notes (Signed)
Presents with laceration to right index finger from a blade at work

## 2017-08-20 NOTE — ED Provider Notes (Signed)
[facility] Emergency Department Provider Note  ____________________________________________  Time seen: Approximately 5:00 PM  I have reviewed the triage vital signs and the nursing notes.   HISTORY  Chief Complaint Hand Pain and Laceration    HPI Lee Sparks is a 47 y.o. male that presents to the emergency department for evaluation of finger laceration from a blade at work.  Patient was cutting pineapple when the knife slipped.  He has some numbness around the laceration but no numbness at tip of finger. He is able to bend finger but with pain.  Last tetanus shot was between 5-10 years ago. No additional injuries.   Past Medical History:  Diagnosis Date  . Alcohol-induced pancreatitis   . Anemia   . Bronchitis     Patient Active Problem List   Diagnosis Date Noted  . Pancreatitis 05/03/2017  . Acute pancreatitis 05/01/2017  . Thrombocytopenia (HCC) 07/29/2011  . Cocaine abuse (HCC) 07/28/2011  . Upper GI bleeding 07/27/2011  . Tachycardia 07/27/2011    Class: Acute  . Hypertension 07/27/2011    Class: Acute  . Alcohol withdrawal syndrome (HCC) 07/27/2011    Class: Acute  . Abdominal pain 07/27/2011    Class: Acute    Past Surgical History:  Procedure Laterality Date  . CYSTECTOMY    . ESOPHAGOGASTRODUODENOSCOPY  07/28/2011   Procedure: ESOPHAGOGASTRODUODENOSCOPY (EGD);  Surgeon: Freddy Jaksch, MD;  Location: [facility] ENDOSCOPY;  Service: Endoscopy;  Laterality: N/A;  . HERNIA REPAIR      Prior to Admission medications   Medication Sig Start Date End Date Taking? Authorizing Provider  albuterol (PROVENTIL HFA;VENTOLIN HFA) 108 (90 Base) MCG/ACT inhaler Inhale 2 puffs into the lungs every 4 (four) hours as needed for wheezing or shortness of breath. 06/17/17   Triplett, Cari B, FNP  benzonatate (TESSALON) 200 MG capsule Take 1 capsule (200 mg total) by mouth 3 (three) times daily as needed for cough. 06/22/17   Fisher, Roselyn Bering, PA-C   cephALEXin (KEFLEX) 500 MG capsule Take 1 capsule (500 mg total) by mouth 4 (four) times daily for 10 days. 08/20/17 08/30/17  Enid Derry, PA-C  guaiFENesin-codeine 100-10 MG/5ML syrup Take 10 mLs by mouth 3 (three) times daily as needed. 06/17/17   Triplett, Rulon Eisenmenger B, FNP  methocarbamol (ROBAXIN) 500 MG tablet Take 1 tablet (500 mg total) by mouth 2 (two) times daily. 07/31/17   Law, Waylan Boga, PA-C  ondansetron (ZOFRAN) 4 MG tablet Take 1 tablet (4 mg total) by mouth every 6 (six) hours as needed for nausea. 05/04/17   Houston Siren, MD  oxyCODONE-acetaminophen (PERCOCET) 7.5-325 MG tablet Take 1 tablet by mouth every 6 (six) hours as needed for moderate pain. 05/04/17   Houston Siren, MD  pantoprazole (PROTONIX) 40 MG tablet Take 1 tablet (40 mg total) by mouth 2 (two) times daily before a meal. 08/01/11 07/31/12  Leroy Sea, MD    Allergies Grapefruit diet or [extra strength grapefruit]  Family History  Problem Relation Age of Onset  . Hypertension Father   . Aneurysm Father   . Cancer Other   . Hypertension Mother   . Pneumonia Mother     Social History Social History   Tobacco Use  . Smoking status: Current Every Day Smoker    Packs/day: 0.50    Years: 15.00    Pack years: 7.50    Types: Cigarettes    Last attempt to quit: 06/27/2011    Years since quitting: 6.1  .  Smokeless tobacco: Never Used  Substance Use Topics  . Alcohol use: Yes    Alcohol/week: 30.0 oz    Types: 50 Cans of beer per week    Comment: Equivalent of two 40 oz beers daily.  . Drug use: No     Review of Systems  Constitutional: No fever/chills Cardiovascular: No chest pain. Respiratory:  No SOB. Gastrointestinal: No abdominal pain.  No nausea, no vomiting.  Musculoskeletal: Positive for finger pain. Skin: Negative for rash, ecchymosis. Positive for laceration   ____________________________________________   PHYSICAL EXAM:  VITAL SIGNS: ED Triage Vitals  Enc Vitals Group     BP  08/20/17 1621 135/88     Pulse --      Resp 08/20/17 1621 20     Temp 08/20/17 1621 98.5 F (36.9 C)     Temp Source 08/20/17 1621 Oral     SpO2 08/20/17 1621 97 %     Weight 08/20/17 1611 175 lb (79.4 kg)     Height 08/20/17 1611 5\' 9"  (1.753 m)     Head Circumference --      Peak Flow --      Pain Score 08/20/17 1639 2     Pain Loc --      Pain Edu? --      Excl. in GC? --      Constitutional: Alert and oriented. Well appearing and in no acute distress. Eyes: Conjunctivae are normal. PERRL. EOMI. Head: Atraumatic. ENT:      Ears:      Nose: No congestion/rhinnorhea.      Mouth/Throat: Mucous membranes are moist.  Neck: No stridor.  Cardiovascular: Good peripheral circulation. Respiratory: Normal respiratory effort without tachypnea or retractions.  Musculoskeletal: Full range of motion to all extremities. No gross deformities appreciated. Neurologic:  Normal speech and language. No gross focal neurologic deficits are appreciated.  Skin:  Skin is warm, dry and intact. 3cm well approximated laceration to PIP joint of right index finger   ____________________________________________   LABS (all labs ordered are listed, but only abnormal results are displayed)  Labs Reviewed - No data to display ____________________________________________  EKG   ____________________________________________  RADIOLOGY   No results found.  ____________________________________________    PROCEDURES  Procedure(s) performed:    Marland Kitchen.Marland Kitchen.Laceration Repair Date/Time: 08/20/2017 6:02 PM Performed by: Enid DerryWagner, Aideliz Garmany, PA-C Authorized by: Enid DerryWagner, Jashayla Glatfelter, PA-C   Consent:    Consent obtained:  Verbal   Consent given by:  Patient   Risks discussed:  Infection, pain, need for additional repair, poor cosmetic result and poor wound healing Anesthesia (see MAR for exact dosages):    Anesthesia method:  None Laceration details:    Location:  Finger   Length (cm):  3 Repair type:     Repair type:  Simple Exploration:    Hemostasis achieved with:  Direct pressure   Wound exploration: wound explored through full range of motion   Treatment:    Area cleansed with:  Saline and Betadine   Amount of cleaning:  Standard   Irrigation solution:  Sterile saline Skin repair:    Repair method:  Steri-Strips and tissue adhesive Approximation:    Approximation:  Close   Vermilion border: well-aligned   Post-procedure details:    Dressing:  Non-adherent dressing   Patient tolerance of procedure:  Tolerated well, no immediate complications      Medications  Tdap (BOOSTRIX) injection 0.5 mL (0.5 mLs Intramuscular Given 08/20/17 1725)     ____________________________________________   INITIAL IMPRESSION /  ASSESSMENT AND PLAN / ED COURSE  Pertinent labs & imaging results that were available during my care of the patient were reviewed by me and considered in my medical decision making (see chart for details).  Review of the [place] CSRS was performed in accordance of the [facility] prior to dispensing any controlled drugs.    Patient's diagnosis is consistent with finger laceration. Vital signs and exam are reassuring. Wound is well approximated and closed with finger in a slightly flexed position. We discussed sutures vs dermabond, and patient preferred dermabond with splint in place for 2 weeks. He would like to avoid sutures. Tetanus shot was updated. Patient will be discharged home with prescriptions for keflex. Patient is to follow up with PCP as directed. Patient is given ED precautions to return to the ED for any worsening or new symptoms.     ____________________________________________  FINAL CLINICAL IMPRESSION(S) / ED DIAGNOSES  Final diagnoses:  Laceration of right index finger without foreign body without damage to nail, initial encounter      NEW MEDICATIONS STARTED DURING THIS VISIT:  ED Discharge Orders        Ordered    cephALEXin (KEFLEX) 500 MG capsule   4 times daily     08/20/17 1730          This chart was dictated using voice recognition software/Dragon. Despite best efforts to proofread, errors can occur which can change the meaning. Any change was purely unintentional.    Enid Derry, PA-C 08/20/17 2008    Willy Eddy, MD 08/20/17 2150

## 2020-04-23 ENCOUNTER — Emergency Department: Payer: Managed Care, Other (non HMO)

## 2020-04-23 ENCOUNTER — Other Ambulatory Visit: Payer: Self-pay

## 2020-04-23 ENCOUNTER — Encounter: Payer: Self-pay | Admitting: Emergency Medicine

## 2020-04-23 ENCOUNTER — Emergency Department
Admission: EM | Admit: 2020-04-23 | Discharge: 2020-04-23 | Disposition: A | Discharge status: Home / Self Care | Payer: Managed Care, Other (non HMO) | Attending: Emergency Medicine | Admitting: Emergency Medicine

## 2020-04-23 DIAGNOSIS — R002 Palpitations: Secondary | ICD-10-CM

## 2020-04-23 DIAGNOSIS — F17210 Nicotine dependence, cigarettes, uncomplicated: Secondary | ICD-10-CM | POA: Insufficient documentation

## 2020-04-23 DIAGNOSIS — I10 Essential (primary) hypertension: Secondary | ICD-10-CM | POA: Diagnosis not present

## 2020-04-23 LAB — CBC
HCT: 45.5 % (ref 39.0–52.0)
Hemoglobin: 16.1 g/dL (ref 13.0–17.0)
MCH: 31 pg (ref 26.0–34.0)
MCHC: 35.4 g/dL (ref 30.0–36.0)
MCV: 87.7 fL (ref 80.0–100.0)
Platelets: 186 10*3/uL (ref 150–400)
RBC: 5.19 MIL/uL (ref 4.22–5.81)
RDW: 14.4 % (ref 11.5–15.5)
WBC: 7.8 10*3/uL (ref 4.0–10.5)
nRBC: 0 % (ref 0.0–0.2)

## 2020-04-23 LAB — BASIC METABOLIC PANEL
Anion gap: 9 (ref 5–15)
BUN: 11 mg/dL (ref 6–20)
CO2: 24 mmol/L (ref 22–32)
Calcium: 9.2 mg/dL (ref 8.9–10.3)
Chloride: 105 mmol/L (ref 98–111)
Creatinine, Ser: 0.84 mg/dL (ref 0.61–1.24)
GFR calc Af Amer: >60 mL/min (ref >60)
GFR calc non Af Amer: >60 mL/min (ref >60)
Glucose, Bld: 81 mg/dL (ref 70–99)
Potassium: 3.9 mmol/L (ref 3.5–5.1)
Sodium: 138 mmol/L (ref 135–145)

## 2020-04-23 LAB — TROPONIN I (HIGH SENSITIVITY)
Troponin I (High Sensitivity): 3 ng/L (ref <18)
Troponin I (High Sensitivity): 3 ng/L (ref <18)

## 2020-04-23 MED ORDER — ASPIRIN EC 81 MG PO TBEC
81.0000 mg | DELAYED_RELEASE_TABLET | Freq: Every day | ORAL | 2 refills | Status: AC
Start: 2020-04-23 — End: 2020-07-22

## 2020-04-23 NOTE — ED Provider Notes (Signed)
[facility] Emergency Department Provider Note    ____________________________________________   I have reviewed the triage vital signs and the nursing notes.   HISTORY  Chief Complaint Palpitations   History limited by: Not Limited   HPI Lee Sparks is a 49 y.o. male who presents to the emergency department today because of concern for palpitations. The patient states that he feels occasional pauses in his heartbeat. He noticed that they started again yesterday and have been somewhat persistent throughout today. Patient had similar episode 4 to 5 years ago. It did appear to get better when he took aspirin and coenzyme Q 10. The patient states that he did exert himself yesterday with playing basketball and working out. The patient denies any recent illness.   Records reviewed. Per medical record review patient has a history of anemia, pancreatitis.   Past Medical History:  Diagnosis Date  . Alcohol-induced pancreatitis   . Anemia   . Bronchitis     Patient Active Problem List   Diagnosis Date Noted  . Pancreatitis 05/03/2017  . Acute pancreatitis 05/01/2017  . Thrombocytopenia (HCC) 07/29/2011  . Cocaine abuse (HCC) 07/28/2011  . Upper GI bleeding 07/27/2011  . Tachycardia 07/27/2011    Class: Acute  . Hypertension 07/27/2011    Class: Acute  . Alcohol withdrawal syndrome (HCC) 07/27/2011    Class: Acute  . Abdominal pain 07/27/2011    Class: Acute    Past Surgical History:  Procedure Laterality Date  . CYSTECTOMY    . ESOPHAGOGASTRODUODENOSCOPY  07/28/2011   Procedure: ESOPHAGOGASTRODUODENOSCOPY (EGD);  Surgeon: Freddy Jaksch, MD;  Location: [facility] ENDOSCOPY;  Service: Endoscopy;  Laterality: N/A;  . HERNIA REPAIR      Prior to Admission medications   Medication Sig Start Date End Date Taking? Authorizing Provider  albuterol (PROVENTIL HFA;VENTOLIN HFA) 108 (90 Base) MCG/ACT inhaler Inhale 2 puffs into the lungs every 4 (four)  hours as needed for wheezing or shortness of breath. Patient not taking: Reported on 04/23/2020 06/17/17   Kem Boroughs B, FNP  benzonatate (TESSALON) 200 MG capsule Take 1 capsule (200 mg total) by mouth 3 (three) times daily as needed for cough. Patient not taking: Reported on 04/23/2020 06/22/17   Faythe Ghee, PA-C  guaiFENesin-codeine 100-10 MG/5ML syrup Take 10 mLs by mouth 3 (three) times daily as needed. Patient not taking: Reported on 04/23/2020 06/17/17   Kem Boroughs B, FNP  methocarbamol (ROBAXIN) 500 MG tablet Take 1 tablet (500 mg total) by mouth 2 (two) times daily. Patient not taking: Reported on 04/23/2020 07/31/17   Emi Holes, PA-C  ondansetron (ZOFRAN) 4 MG tablet Take 1 tablet (4 mg total) by mouth every 6 (six) hours as needed for nausea. Patient not taking: Reported on 04/23/2020 05/04/17   Houston Siren, MD  oxyCODONE-acetaminophen (PERCOCET) 7.5-325 MG tablet Take 1 tablet by mouth every 6 (six) hours as needed for moderate pain. Patient not taking: Reported on 04/23/2020 05/04/17   Houston Siren, MD  pantoprazole (PROTONIX) 40 MG tablet Take 1 tablet (40 mg total) by mouth 2 (two) times daily before a meal. Patient not taking: Reported on 04/23/2020 08/01/11 07/31/12  Leroy Sea, MD    Allergies Grapefruit diet or [extra strength grapefruit]  Family History  Problem Relation Age of Onset  . Hypertension Father   . Aneurysm Father   . Cancer Other   . Hypertension Mother   . Pneumonia Mother     Social History Social History  Tobacco Use  . Smoking status: Current Every Day Smoker    Packs/day: 0.50    Years: 15.00    Pack years: 7.50    Types: Cigarettes    Last attempt to quit: 06/27/2011    Years since quitting: 8.8  . Smokeless tobacco: Never Used  Substance Use Topics  . Alcohol use: Yes    Alcohol/week: 50.0 standard drinks    Types: 50 Cans of beer per week    Comment: Equivalent of two 40 oz beers daily.  . Drug use: No     Review of Systems Constitutional: No fever/chills Eyes: No visual changes. ENT: No sore throat. Cardiovascular: Positive for palpitations. Negative for chest pain. Respiratory: Denies shortness of breath. Gastrointestinal: No abdominal pain.  No nausea, no vomiting.  No diarrhea.   Genitourinary: Negative for dysuria. Musculoskeletal: Negative for back pain. Skin: Negative for rash. Neurological: Negative for headaches, focal weakness or numbness.  ____________________________________________   PHYSICAL EXAM:  VITAL SIGNS: ED Triage Vitals  Enc Vitals Group     BP 04/23/20 1024 (!) 160/88     Pulse Rate 04/23/20 1024 (!) 119     Resp 04/23/20 1024 20     Temp 04/23/20 1024 99.1 F (37.3 C)     Temp Source 04/23/20 1024 Oral     SpO2 04/23/20 1024 98 %     Weight 04/23/20 1023 180 lb (81.6 kg)     Height 04/23/20 1023 5\' 9"  (1.753 m)     Head Circumference --      Peak Flow --      Pain Score 04/23/20 1022 0    Constitutional: Alert and oriented.  Eyes: Conjunctivae are normal.  ENT      Head: Normocephalic and atraumatic.      Nose: No congestion/rhinnorhea.      Mouth/Throat: Mucous membranes are moist.      Neck: No stridor. Hematological/Lymphatic/Immunilogical: No cervical lymphadenopathy. Cardiovascular: Normal rate, regular rhythm.  No murmurs, rubs, or gallops.  Respiratory: Normal respiratory effort without tachypnea nor retractions. Breath sounds are clear and equal bilaterally. No wheezes/rales/rhonchi. Gastrointestinal: Soft and non tender. No rebound. No guarding.  Genitourinary: Deferred Musculoskeletal: Normal range of motion in all extremities. No lower extremity edema. Neurologic:  Normal speech and language. No gross focal neurologic deficits are appreciated.  Skin:  Skin is warm, dry and intact. No rash noted. Psychiatric: Mood and affect are normal. Speech and behavior are normal. Patient exhibits appropriate insight and  judgment.  ____________________________________________    LABS (pertinent positives/negatives)  Trop hs 3 x2 CBC wbc 7.8, hgb 16.1, plt 186 BMP wnl  ____________________________________________   EKG  I, 04/25/20, attending physician, personally viewed and interpreted this EKG  EKG Time: 1018 Rate: 121 Rhythm: sinus tachycardia Axis: normal Intervals: qtc 440 QRS: narrow ST changes: no st elevation Impression: abnormal ekg  ____________________________________________    RADIOLOGY  CXR Negative cxr  ____________________________________________   PROCEDURES  Procedures  ____________________________________________   INITIAL IMPRESSION / ASSESSMENT AND PLAN / ED COURSE  Pertinent labs & imaging results that were available during my care of the patient were reviewed by me and considered in my medical decision making (see chart for details).   Patient presented to the emergency department today because of concerns for palpitations. Patient's clinical history is consistent with PVCs. Does not clinically sound like A. fib, V. tach or other concerning arrhythmia. Patient's blood work without concerning electrolyte abnormality or troponin elevation. EKG showed some tachycardia but  otherwise no concerning morphology. This time I did discuss with patient that think he is suffering from PVCs. Discussed follow-up with possible Holter placement if becomes worse. Patient did request prescription for baby aspirin.    ____________________________________________   FINAL CLINICAL IMPRESSION(S) / ED DIAGNOSES  Final diagnoses:  Palpitations     Note: This dictation was prepared with Dragon dictation. Any transcriptional errors that result from this process are unintentional     Phineas Semen, MD 04/23/20 1733

## 2020-04-23 NOTE — ED Notes (Signed)
Rainbow sent to the lab.  

## 2020-04-23 NOTE — Discharge Instructions (Signed)
Please seek medical attention for any high fevers, chest pain, shortness of breath, change in behavior, persistent vomiting, bloody stool or any other new or concerning symptoms.  

## 2020-04-23 NOTE — ED Triage Notes (Signed)
Pt presents to ED via POV with c/o palpitations. Pt states same approx 4-5 years ago, started taking CoQ10 with symptoms resolution, stopped taking and now feels like he is having palpitations again that started yesterday.

## 2020-04-23 NOTE — ED Notes (Signed)
No wow available for e signature

## 2020-05-03 ENCOUNTER — Telehealth: Payer: Self-pay

## 2020-05-03 NOTE — Telephone Encounter (Signed)
Left message for patient to confirm active insurance coverage.  Has new patient appointment with [facility] on 05/10/20, however [employer] shows him having active [employer] coverage.  Unable to see patient with active insurance so will need to confirm status.  Encouraged patient to call [facility].

## 2020-05-10 ENCOUNTER — Other Ambulatory Visit: Payer: Self-pay

## 2020-05-10 ENCOUNTER — Ambulatory Visit: Payer: Self-pay | Admitting: Gerontology

## 2020-05-10 ENCOUNTER — Encounter: Payer: Self-pay | Admitting: Gerontology

## 2020-05-10 ENCOUNTER — Other Ambulatory Visit: Payer: Self-pay | Admitting: Gerontology

## 2020-05-10 VITALS — BP 133/86 | HR 98 | Temp 98.3°F | Resp 16 | Wt 192.2 lb

## 2020-05-10 DIAGNOSIS — Z9109 Other allergy status, other than to drugs and biological substances: Secondary | ICD-10-CM

## 2020-05-10 DIAGNOSIS — Z7689 Persons encountering health services in other specified circumstances: Secondary | ICD-10-CM

## 2020-05-10 DIAGNOSIS — Z87898 Personal history of other specified conditions: Secondary | ICD-10-CM

## 2020-05-10 DIAGNOSIS — F419 Anxiety disorder, unspecified: Secondary | ICD-10-CM

## 2020-05-10 DIAGNOSIS — K0889 Other specified disorders of teeth and supporting structures: Secondary | ICD-10-CM

## 2020-05-10 MED ORDER — CETIRIZINE HCL 5 MG PO TABS: 5.0000 mg | ORAL_TABLET | Freq: Every day | ORAL | 0 refills | Status: DC

## 2020-05-10 MED ORDER — ACETAMINOPHEN 500 MG PO TABS: 1000.0000 mg | ORAL_TABLET | Freq: Two times a day (BID) | ORAL | 0 refills | Status: DC | PRN

## 2020-05-10 NOTE — Progress Notes (Signed)
Patient ID: Lee Sparks, male   DOB: [birth year], 49 y.o.   MRN: [id number]  No chief complaint on file.   HPI Lee Sparks is a 49 y.o. male who presents to establish care and review his medical problems. He resides at [facility]. He states that he takes no medications. He was seen on 04/23/2020 at the Emergency Department for palpitations related to PVCs. Patient states that his chest pain and palpitations have resolved. No chest pain, tightness, or shortness of breath. He does report nasal and sinus congestion and pressure since moving to this area about 2.5 months ago. Nothing makes it better or worse. He has not tried anything for his symptoms. He reports a history of allergic rhinitis. Patient also reports that, years ago, he was on Celexa for anxiety and he feels that it worked well. He states that his anxiety has increased since recently moving and stopping drinking alcohol. He denies SI/HI. Patient also reports that his R upper wisdom tooth is coming in and causing him intermittent pain. Patent states that overall, he is doing well and offers no further complaint.   Past Medical History:  Diagnosis Date   Alcohol-induced pancreatitis    Anemia    Bronchitis     Past Surgical History:  Procedure Laterality Date   CYSTECTOMY     ESOPHAGOGASTRODUODENOSCOPY  07/28/2011   Procedure: ESOPHAGOGASTRODUODENOSCOPY (EGD);  Surgeon: Freddy Jaksch, MD;  Location: [facility] ENDOSCOPY;  Service: Endoscopy;  Laterality: N/A;   HERNIA REPAIR      Family History  Problem Relation Age of Onset   Hypertension Father    Aneurysm Father    Cancer Other    Hypertension Mother    Pneumonia Mother     Social History Social History   Tobacco Use   Smoking status: Current Every Day Smoker    Packs/day: 0.50    Years: 15.00    Pack years: 7.50    Types: Cigarettes    Last attempt to quit: 06/27/2011    Years since quitting: 8.8   Smokeless tobacco: Never Used  Substance Use Topics     Alcohol use: Yes    Alcohol/week: 50.0 standard drinks    Types: 50 Cans of beer per week    Comment: Equivalent of two 40 oz beers daily.   Drug use: No    Allergies  Allergen Reactions   Grapefruit Diet Or [Extra Strength Grapefruit] Hives    Pt states he has a reaction to grapefruit.     Current Outpatient Medications  Medication Sig Dispense Refill   aspirin EC 81 MG tablet Take 1 tablet (81 mg total) by mouth daily. Swallow whole. 30 tablet 2   albuterol (PROVENTIL HFA;VENTOLIN HFA) 108 (90 Base) MCG/ACT inhaler Inhale 2 puffs into the lungs every 4 (four) hours as needed for wheezing or shortness of breath. (Patient not taking: Reported on 04/23/2020) 1 Inhaler 1   No current facility-administered medications for this visit.    Review of Systems Review of Systems  Constitutional: Negative.   HENT: Positive for congestion and sinus pressure.   Respiratory: Negative.   Cardiovascular: Negative.   Gastrointestinal: Negative.   Endocrine: Negative.   Genitourinary: Negative.   Musculoskeletal: Negative.   Skin: Negative.   Allergic/Immunologic: Positive for environmental allergies.  Neurological: Negative.   Hematological: Negative.   Psychiatric/Behavioral: The patient is nervous/anxious.     Blood pressure 133/86, pulse 98, temperature 98.3 F (36.8 C), resp. rate 16, weight 192 lb 3.2 oz (87.2  kg), SpO2 97 %.  Physical Exam Physical Exam Constitutional:      Appearance: Normal appearance.  HENT:     Head: Normocephalic and atraumatic.     Nose: Congestion present.     Mouth/Throat:     Mouth: Mucous membranes are moist.     Pharynx: Oropharynx is clear. No oropharyngeal exudate or posterior oropharyngeal erythema.   Eyes:     Extraocular Movements: Extraocular movements intact.     Conjunctiva/sclera: Conjunctivae normal.     Pupils: Pupils are equal, round, and reactive to light.  Cardiovascular:     Rate and Rhythm: Normal rate and regular  rhythm.     Pulses: Normal pulses.     Heart sounds: Normal heart sounds.  Pulmonary:     Effort: Pulmonary effort is normal.     Breath sounds: Normal breath sounds.  Abdominal:     General: Abdomen is flat. Bowel sounds are normal.     Palpations: Abdomen is soft.  Musculoskeletal:        General: Normal range of motion.     Cervical back: Normal range of motion.  Skin:    General: Skin is warm and dry.     Capillary Refill: Capillary refill takes less than 2 seconds.  Neurological:     General: No focal deficit present.     Mental Status: He is alert and oriented to person, place, and time.  Psychiatric:        Mood and Affect: Mood normal.        Behavior: Behavior normal.        Thought Content: Thought content normal.        Judgment: Judgment normal.     Assessment and Plan 1. Environmental allergies Start cetirizine 5mg  tab PO daily.   Call clinic for concerns with side effects. Common side effects include dry mouth, headache, drowsiness and nausea.  Avoid allergy triggers.  - cetirizine (ZYRTEC) 5 MG tablet; Take 1 tablet (5 mg total) by mouth daily.  Dispense: 30 tablet; Refill: 0  2. Anxiety Follow up with [facility]. Handout provided. He was provided with and advised to call the crisis help line or go to the ED for worsening symptoms of anxiety.  3. Encounter to establish care Return 05/31/20 for lab draw and follow up appointment on 06/07/20. - HgB A1c; Future - TSH; Future - Lipid Profile; Future - Urinalysis; Future  4. History of palpitations Monitor symptoms.  Report to the ED for palpitations, chest pain, shortness of breath or syncope. - Ambulatory referral to Cardiology  5. Pain, dental Dental application provided.   Return in about 4 weeks (around 06/07/2020), or if symptoms worsen or fail to improve.   13/05/2020 05/10/2020, 1:02 PM

## 2020-05-18 ENCOUNTER — Other Ambulatory Visit: Payer: Self-pay

## 2020-05-18 ENCOUNTER — Emergency Department
Admission: EM | Admit: 2020-05-18 | Discharge: 2020-05-19 | Disposition: A | Discharge status: Home / Self Care | Payer: Managed Care, Other (non HMO) | Attending: Emergency Medicine | Admitting: Emergency Medicine

## 2020-05-18 DIAGNOSIS — L02415 Cutaneous abscess of right lower limb: Secondary | ICD-10-CM | POA: Diagnosis present

## 2020-05-18 DIAGNOSIS — Z7982 Long term (current) use of aspirin: Secondary | ICD-10-CM | POA: Diagnosis not present

## 2020-05-18 DIAGNOSIS — F17210 Nicotine dependence, cigarettes, uncomplicated: Secondary | ICD-10-CM | POA: Insufficient documentation

## 2020-05-18 DIAGNOSIS — L0291 Cutaneous abscess, unspecified: Secondary | ICD-10-CM

## 2020-05-18 LAB — COMPREHENSIVE METABOLIC PANEL
ALT: 13 U/L (ref 0–44)
AST: 16 U/L (ref 15–41)
Albumin: 4.3 g/dL (ref 3.5–5.0)
Alkaline Phosphatase: 45 U/L (ref 38–126)
Anion gap: 11 (ref 5–15)
BUN: 13 mg/dL (ref 6–20)
CO2: 23 mmol/L (ref 22–32)
Calcium: 9.7 mg/dL (ref 8.9–10.3)
Chloride: 102 mmol/L (ref 98–111)
Creatinine, Ser: 0.77 mg/dL (ref 0.61–1.24)
GFR, Estimated: >60 mL/min (ref >60)
Glucose, Bld: 115 mg/dL — ABNORMAL HIGH (ref 70–99)
Potassium: 3.3 mmol/L — ABNORMAL LOW (ref 3.5–5.1)
Sodium: 136 mmol/L (ref 135–145)
Total Bilirubin: 0.9 mg/dL (ref 0.3–1.2)
Total Protein: 8 g/dL (ref 6.5–8.1)

## 2020-05-18 LAB — CBC WITH DIFFERENTIAL/PLATELET
Abs Immature Granulocytes: 0.08 10*3/uL — ABNORMAL HIGH (ref 0.00–0.07)
Basophils Absolute: 0.1 10*3/uL (ref 0.0–0.1)
Basophils Relative: 0 %
Eosinophils Absolute: 0.2 10*3/uL (ref 0.0–0.5)
Eosinophils Relative: 1 %
HCT: 41.3 % (ref 39.0–52.0)
Hemoglobin: 14 g/dL (ref 13.0–17.0)
Immature Granulocytes: 1 %
Lymphocytes Relative: 19 %
Lymphs Abs: 2.8 10*3/uL (ref 0.7–4.0)
MCH: 29.6 pg (ref 26.0–34.0)
MCHC: 33.9 g/dL (ref 30.0–36.0)
MCV: 87.3 fL (ref 80.0–100.0)
Monocytes Absolute: 1 10*3/uL (ref 0.1–1.0)
Monocytes Relative: 7 %
Neutro Abs: 11 10*3/uL — ABNORMAL HIGH (ref 1.7–7.7)
Neutrophils Relative %: 72 %
Platelets: 210 10*3/uL (ref 150–400)
RBC: 4.73 MIL/uL (ref 4.22–5.81)
RDW: 12.9 % (ref 11.5–15.5)
WBC: 15.1 10*3/uL — ABNORMAL HIGH (ref 4.0–10.5)
nRBC: 0 % (ref 0.0–0.2)

## 2020-05-18 LAB — LACTIC ACID, PLASMA: Lactic Acid, Venous: 1 mmol/L (ref 0.5–1.9)

## 2020-05-18 NOTE — ED Triage Notes (Signed)
Pt to ED via POV c/o possible unknown insect bite noticed yesterday.  Swelling/redness and purple circle area noted to back of upper right thigh States has had fevers max 99 today and took tylenol, afebrile on arrival  States has had discharge from area, blood and pus.

## 2020-05-18 NOTE — ED Provider Notes (Signed)
[facility] Emergency Department Provider Note   ____________________________________________   I have reviewed the triage vital signs and the nursing notes.   HISTORY  Chief Complaint Insect Bite   History limited by: Not Limited   HPI Lee Sparks is a 49 y.o. male who presents to the emergency department today because of concern for pain, swelling and abscess to back of right thigh. The patient states that he first noticed the symptoms yesterday. Is unsure if he had any insect bite to the area. He says that he took a hot shower earlier today and was able to have a large amount of pus like drainage come from the area of concern. He denies any fevers, nausea or vomiting. Says that he had to have an abscess drained once previously a number of years ago.   Records reviewed. Per medical record review patient has a history of pancreatitis, drug abuse.   Past Medical History:  Diagnosis Date  . Alcohol-induced pancreatitis   . Anemia   . Bronchitis     Patient Active Problem List   Diagnosis Date Noted  . Pancreatitis 05/03/2017  . Acute pancreatitis 05/01/2017  . Thrombocytopenia (HCC) 07/29/2011  . Cocaine abuse (HCC) 07/28/2011  . Upper GI bleeding 07/27/2011  . Tachycardia 07/27/2011    Class: Acute  . Hypertension 07/27/2011    Class: Acute  . Alcohol withdrawal syndrome (HCC) 07/27/2011    Class: Acute  . Abdominal pain 07/27/2011    Class: Acute    Past Surgical History:  Procedure Laterality Date  . CYSTECTOMY    . ESOPHAGOGASTRODUODENOSCOPY  07/28/2011   Procedure: ESOPHAGOGASTRODUODENOSCOPY (EGD);  Surgeon: Freddy Jaksch, MD;  Location: [facility] ENDOSCOPY;  Service: Endoscopy;  Laterality: N/A;  . HERNIA REPAIR      Prior to Admission medications   Medication Sig Start Date End Date Taking? Authorizing Provider  acetaminophen (TYLENOL) 500 MG tablet Take 2 tablets (1,000 mg total) by mouth every 12 (twelve) hours as needed for  moderate pain. 05/10/20   Iloabachie, Chioma E, NP  albuterol (PROVENTIL HFA;VENTOLIN HFA) 108 (90 Base) MCG/ACT inhaler Inhale 2 puffs into the lungs every 4 (four) hours as needed for wheezing or shortness of breath. Patient not taking: Reported on 04/23/2020 06/17/17   Chinita Pester, FNP  aspirin EC 81 MG tablet Take 1 tablet (81 mg total) by mouth daily. Swallow whole. 04/23/20 07/22/20  Phineas Semen, MD  cetirizine (ZYRTEC) 5 MG tablet Take 1 tablet (5 mg total) by mouth daily. 05/10/20   Iloabachie, Chioma E, NP    Allergies Grapefruit diet or [extra strength grapefruit]  Family History  Problem Relation Age of Onset  . Hypertension Father   . Aneurysm Father   . Cancer Other   . Hypertension Mother   . Pneumonia Mother     Social History Social History   Tobacco Use  . Smoking status: Current Every Day Smoker    Packs/day: 0.50    Years: 15.00    Pack years: 7.50    Types: Cigarettes    Last attempt to quit: 06/27/2011    Years since quitting: 8.8  . Smokeless tobacco: Never Used  Substance Use Topics  . Alcohol use: Yes    Alcohol/week: 50.0 standard drinks    Types: 50 Cans of beer per week    Comment: Equivalent of two 40 oz beers daily.  . Drug use: No    Review of Systems Constitutional: No fever/chills Eyes: No visual changes. ENT:  No sore throat. Cardiovascular: Denies chest pain. Respiratory: Denies shortness of breath. Gastrointestinal: No abdominal pain.  No nausea, no vomiting.  No diarrhea.   Genitourinary: Negative for dysuria. Musculoskeletal: Positive for right thigh swelling, erythema and pain.  Skin: Negative for rash. Neurological: Negative for headaches, focal weakness or numbness.  ____________________________________________   PHYSICAL EXAM:  VITAL SIGNS: ED Triage Vitals  Enc Vitals Group     BP 05/18/20 2217 (!) 145/81     Pulse Rate 05/18/20 2217 (!) 108     Resp 05/18/20 2217 20     Temp 05/18/20 2217 98.8 F (37.1 C)      Temp Source 05/18/20 2217 Oral     SpO2 05/18/20 2217 96 %     Weight 05/18/20 2219 185 lb (83.9 kg)     Height 05/18/20 2219 5\' 9"  (1.753 m)     Head Circumference --      Peak Flow --      Pain Score 05/18/20 2218 5   Constitutional: Alert and oriented.  Eyes: Conjunctivae are normal.  ENT      Head: Normocephalic and atraumatic.      Nose: No congestion/rhinnorhea.      Mouth/Throat: Mucous membranes are moist.      Neck: No stridor. Hematological/Lymphatic/Immunilogical: No cervical lymphadenopathy. Cardiovascular: Normal rate, regular rhythm.  No murmurs, rubs, or gallops.  Respiratory: Normal respiratory effort without tachypnea nor retractions. Breath sounds are clear and equal bilaterally. No wheezes/rales/rhonchi. Gastrointestinal: Soft and non tender. No rebound. No guarding.  Genitourinary: Deferred Musculoskeletal: Normal range of motion in all extremities. No lower extremity edema. Neurologic:  Normal speech and language. No gross focal neurologic deficits are appreciated.  Skin:  Abscess to right thigh with surrounding erythema.  Psychiatric: Mood and affect are normal. Speech and behavior are normal. Patient exhibits appropriate insight and judgment.  ____________________________________________    LABS (pertinent positives/negatives)  Lactic acid 1.0 CBC wbc 15.1, hgb 14.0, plt 210 CMP wnl except k 3.3, glu 115  ____________________________________________   EKG  None  ____________________________________________    RADIOLOGY  None  ____________________________________________   PROCEDURES  Procedures  Incision and Drainage of Abscess Location: right thigh Anesthesia Local: 1% Lidocaine with Epi  Prep/Procedure: Skin Prep: Chlorahex Incised abscess with #11 blade Purulent discharge: moderate Probed to break up loculations Packed with 1/4" gauze Estimated blood loss: 2 ml  ____________________________________________   INITIAL  IMPRESSION / ASSESSMENT AND PLAN / ED COURSE  Pertinent labs & imaging results that were available during my care of the patient were reviewed by me and considered in my medical decision making (see chart for details).   Patient presented to the emergency department today because of concern for abscess. There had been some spontaneous drainage. Did I and D with moderate amount of pus elicited. Given surrounding erythema will plan on placing patient on antibiotics. Discussed plan with patient.    ____________________________________________   FINAL CLINICAL IMPRESSION(S) / ED DIAGNOSES  Final diagnoses:  Abscess     Note: This dictation was prepared with Dragon dictation. Any transcriptional errors that result from this process are unintentional     2219, MD 05/19/20 [phone]

## 2020-05-18 NOTE — ED Notes (Signed)
1 set of blood cultures sent to lab.  

## 2020-05-19 MED ORDER — LIDOCAINE HCL (PF) 1 % IJ SOLN: 5.0000 mL | Freq: Once | INTRAMUSCULAR | Status: DC

## 2020-05-19 MED ORDER — SULFAMETHOXAZOLE-TRIMETHOPRIM 800-160 MG PO TABS: 1.0000 | ORAL_TABLET | Freq: Two times a day (BID) | ORAL | 0 refills | Status: AC

## 2020-05-19 MED ORDER — LIDOCAINE HCL (PF) 1 % IJ SOLN
INTRAMUSCULAR | Status: AC
  Filled 2020-05-19: qty 5

## 2020-05-19 NOTE — Discharge Instructions (Signed)
As we discussed starting Sunday you can pull out the packing material one centimeter at a time until it all comes out. Please seek medical attention for any high fevers, chest pain, shortness of breath, change in behavior, persistent vomiting, bloody stool or any other new or concerning symptoms.

## 2020-05-30 ENCOUNTER — Other Ambulatory Visit: Payer: Self-pay

## 2020-05-30 ENCOUNTER — Other Ambulatory Visit: Payer: Medicaid Other

## 2020-05-30 DIAGNOSIS — Z7689 Persons encountering health services in other specified circumstances: Secondary | ICD-10-CM

## 2020-05-31 LAB — URINALYSIS
Bilirubin, UA: NEGATIVE
Glucose, UA: NEGATIVE
Ketones, UA: NEGATIVE
Leukocytes,UA: NEGATIVE
Nitrite, UA: NEGATIVE
Protein,UA: NEGATIVE
RBC, UA: NEGATIVE
Specific Gravity, UA: 1.013 (ref 1.005–1.030)
Urobilinogen, Ur: 0.2 mg/dL (ref 0.2–1.0)
pH, UA: 6.5 (ref 5.0–7.5)

## 2020-05-31 LAB — TSH: TSH: 1.01 u[IU]/mL (ref 0.450–4.500)

## 2020-05-31 LAB — LIPID PANEL
Chol/HDL Ratio: 7.4 ratio — ABNORMAL HIGH (ref 0.0–5.0)
Cholesterol, Total: 206 mg/dL — ABNORMAL HIGH (ref 100–199)
HDL: 28 mg/dL — ABNORMAL LOW (ref >39)
LDL Chol Calc (NIH): 144 mg/dL — ABNORMAL HIGH (ref 0–99)
Triglycerides: 187 mg/dL — ABNORMAL HIGH (ref 0–149)
VLDL Cholesterol Cal: 34 mg/dL (ref 5–40)

## 2020-05-31 LAB — HEMOGLOBIN A1C
Est. average glucose Bld gHb Est-mCnc: 117 mg/dL
Hgb A1c MFr Bld: 5.7 % — ABNORMAL HIGH (ref 4.8–5.6)

## 2020-06-06 ENCOUNTER — Telehealth: Payer: Self-pay | Admitting: Pharmacy Technician

## 2020-06-06 NOTE — Telephone Encounter (Signed)
Patient has prescription drug coverage with [employer].  Does not meet [facility]'s eligibility criteria.  Patient notified.  Sherilyn Dacosta Care Manager [facility]   [address] [place], [place]  [address]     This is to inform you that you are no longer eligible to receive medication assistance at [facility].  The reason(s) are:    _____Your total gross monthly household income exceeds 250% of the Federal Poverty Level.   _____Tangible assets (savings, checking, stocks/bonds, pension, retirement, etc.) exceeds our limit  _____You are eligible to receive benefits from [facility], [facility] or HIV Medication            Assistance program _____You are eligible to receive benefits from a Medicare Part "D" plan __X__You have prescription insurance  _____You are not an [facility] resident _____Failure to provide all requested proof of income information for 2021.    We regret that we are unable to help you at this time.  If your prescription coverage is terminated, please contact [facility], so that we may reassess your eligibility for our program.  If you have questions, we may be contacted at [phone].  Thank you,  [facility]

## 2020-06-07 ENCOUNTER — Ambulatory Visit: Payer: Self-pay | Admitting: Gerontology

## 2020-06-20 ENCOUNTER — Ambulatory Visit: Payer: Self-pay | Admitting: Gerontology

## 2020-07-16 ENCOUNTER — Emergency Department: Payer: Self-pay

## 2020-07-16 ENCOUNTER — Emergency Department
Admission: EM | Admit: 2020-07-16 | Discharge: 2020-07-16 | Disposition: A | Discharge status: Home / Self Care | Payer: Self-pay | Attending: Emergency Medicine | Admitting: Emergency Medicine

## 2020-07-16 ENCOUNTER — Other Ambulatory Visit: Payer: Self-pay

## 2020-07-16 ENCOUNTER — Encounter: Payer: Self-pay | Admitting: Emergency Medicine

## 2020-07-16 DIAGNOSIS — I10 Essential (primary) hypertension: Secondary | ICD-10-CM | POA: Insufficient documentation

## 2020-07-16 DIAGNOSIS — F17210 Nicotine dependence, cigarettes, uncomplicated: Secondary | ICD-10-CM | POA: Insufficient documentation

## 2020-07-16 DIAGNOSIS — N5089 Other specified disorders of the male genital organs: Secondary | ICD-10-CM | POA: Insufficient documentation

## 2020-07-16 DIAGNOSIS — Z7982 Long term (current) use of aspirin: Secondary | ICD-10-CM | POA: Insufficient documentation

## 2020-07-16 LAB — URINALYSIS, COMPLETE (UACMP) WITH MICROSCOPIC
Bacteria, UA: NONE SEEN
Bilirubin Urine: NEGATIVE
Glucose, UA: NEGATIVE mg/dL
Hgb urine dipstick: NEGATIVE
Ketones, ur: NEGATIVE mg/dL
Leukocytes,Ua: NEGATIVE
Nitrite: NEGATIVE
Protein, ur: NEGATIVE mg/dL
Specific Gravity, Urine: 1.003 — ABNORMAL LOW (ref 1.005–1.030)
Squamous Epithelial / HPF: NONE SEEN (ref 0–5)
pH: 5 (ref 5.0–8.0)

## 2020-07-16 NOTE — ED Triage Notes (Signed)
Patient to ER for c/o abscess to scrotal area for approximately 4 days. Denies any known drainage, denies any fevers.

## 2020-07-16 NOTE — ED Provider Notes (Signed)
[facility] Emergency Department Provider Note ____________________________________________  Time seen: 1259  I have reviewed the triage vital signs and the nursing notes.  HISTORY  Chief Complaint  Abscess  HPI Lee Sparks is a 49 y.o. male presents himself to the ED for evaluation of  an area to the scrotum has been present for about 4 days.  Patient describes a pea-sized, cystlike lesion within the skin of his scrotum near his right testicle.  He denies any scrotal pain, swelling, dysuria, hematuria, urinary retention.  He denies any penile lesions, or penile drainage.  Patient denies any trauma to the area denies any history of chronic ongoing skin lesions.  He denies any spontaneous purulent drainage, and denies any injury or irritation from shaving.  He is concerned that the area may represent an abscess.  Past Medical History:  Diagnosis Date  . Alcohol-induced pancreatitis   . Anemia   . Bronchitis     Patient Active Problem List   Diagnosis Date Noted  . Pancreatitis 05/03/2017  . Acute pancreatitis 05/01/2017  . Thrombocytopenia (HCC) 07/29/2011  . Cocaine abuse (HCC) 07/28/2011  . Upper GI bleeding 07/27/2011  . Tachycardia 07/27/2011    Class: Acute  . Hypertension 07/27/2011    Class: Acute  . Alcohol withdrawal syndrome (HCC) 07/27/2011    Class: Acute  . Abdominal pain 07/27/2011    Class: Acute    Past Surgical History:  Procedure Laterality Date  . CYSTECTOMY    . ESOPHAGOGASTRODUODENOSCOPY  07/28/2011   Procedure: ESOPHAGOGASTRODUODENOSCOPY (EGD);  Surgeon: Freddy Jaksch, MD;  Location: [facility] ENDOSCOPY;  Service: Endoscopy;  Laterality: N/A;  . HERNIA REPAIR      Prior to Admission medications   Medication Sig Start Date End Date Taking? Authorizing Provider  acetaminophen (TYLENOL) 500 MG tablet Take 2 tablets (1,000 mg total) by mouth every 12 (twelve) hours as needed for moderate pain. 05/10/20   Iloabachie, Chioma E, NP   albuterol (PROVENTIL HFA;VENTOLIN HFA) 108 (90 Base) MCG/ACT inhaler Inhale 2 puffs into the lungs every 4 (four) hours as needed for wheezing or shortness of breath. Patient not taking: Reported on 04/23/2020 06/17/17   Chinita Pester, FNP  aspirin EC 81 MG tablet Take 1 tablet (81 mg total) by mouth daily. Swallow whole. 04/23/20 07/22/20  Phineas Semen, MD  cetirizine (ZYRTEC) 5 MG tablet Take 1 tablet (5 mg total) by mouth daily. 05/10/20   Iloabachie, Chioma E, NP    Allergies Grapefruit diet or [extra strength grapefruit]  Family History  Problem Relation Age of Onset  . Hypertension Father   . Aneurysm Father   . Cancer Other   . Hypertension Mother   . Pneumonia Mother     Social History Social History   Tobacco Use  . Smoking status: Current Every Day Smoker    Packs/day: 0.50    Years: 15.00    Pack years: 7.50    Types: Cigarettes    Last attempt to quit: 06/27/2011    Years since quitting: 9.0  . Smokeless tobacco: Never Used  Substance Use Topics  . Alcohol use: Yes    Alcohol/week: 50.0 standard drinks    Types: 50 Cans of beer per week    Comment: Equivalent of two 40 oz beers daily.  . Drug use: No    Review of Systems  Constitutional: Negative for fever. Cardiovascular: Negative for chest pain. Respiratory: Negative for shortness of breath. Gastrointestinal: Negative for abdominal pain, vomiting and diarrhea. Genitourinary:  Negative for dysuria.  Scrotal massas above. Musculoskeletal: Negative for back pain. Skin: Negative for rash. Neurological: Negative for headaches, focal weakness or numbness. ____________________________________________  PHYSICAL EXAM:  VITAL SIGNS: ED Triage Vitals  Enc Vitals Group     BP 07/16/20 1053 (!) 145/106     Pulse Rate 07/16/20 1053 74     Resp 07/16/20 1053 18     Temp 07/16/20 1053 98.6 F (37 C)     Temp Source 07/16/20 1053 Oral     SpO2 07/16/20 1053 99 %     Weight 07/16/20 1054 185 lb (83.9  kg)     Height 07/16/20 1054 5\' 9"  (1.753 m)     Head Circumference --      Peak Flow --      Pain Score 07/16/20 1054 5     Pain Loc --      Pain Edu? --      Excl. in GC? --     Constitutional: Alert and oriented. Well appearing and in no distress. Head: Normocephalic and atraumatic. Eyes: Conjunctivae are normal. Normal extraocular movements Hematological/Lymphatic/Immunological: No inguinal lymphadenopathy. Cardiovascular: Normal rate, regular rhythm. Normal distal pulses. Respiratory: Normal respiratory effort. No wheezes/rales/rhonchi. Gastrointestinal: Soft and nontender. No distention. GU: mobile, non-tender, focal, well-circumscribed subcutaneous lesion noted to the right scrotum  Musculoskeletal: Nontender with normal range of motion in all extremities.  Neurologic:  Normal gait without ataxia. Normal speech and language. No gross focal neurologic deficits are appreciated. Skin:  Skin is warm, dry and intact. No rash noted. ____________________________________________   LABS (pertinent positives/negatives) Labs Reviewed  URINALYSIS, COMPLETE (UACMP) WITH MICROSCOPIC - Abnormal; Notable for the following components:      Result Value   Color, Urine COLORLESS (*)    APPearance CLEAR (*)    Specific Gravity, Urine 1.003 (*)    All other components within normal limits   ____________________________________________   RADIOLOGY  07/18/20 Scrotum w/ Doppler  IMPRESSION: 1. A 0.9 x 0.6 x 0.8 cm vascular subcutaneus soft tissue right scrotal lesion (extratesticular) in the palpable region. Differential diagnosis includes fibrous lesion versus angiomyofibroblastoma-like tumor versus leiomyoma versus lipomatous lesion. Recommend urologic consultation. 2. No testicular or epididymal abnormality. ____________________________________________  PROCEDURES  Procedures ____________________________________________  INITIAL IMPRESSION / ASSESSMENT AND PLAN / ED COURSE  DDX:  torsion, epipdydymitis, hydrocele, abscess, folliculitis  Patient ED evaluation of a recently discovered nontender subtendinous lesion of the scrotum.  Patient presented with concern of a possible abscess to the area.  He was ultrasound of abundance of precaution since the area did not present like a typical cellulitis/abscess.  He was found to have a almost 1 cm subcutaneous, extratesticular lesion, concerning for angiomyofibroblastoma-like lesion versus leiomyoma versus lipomatous lesion.  Patient was advised of the ultrasound findings, and further reassured that a local I&D procedure would not be adventitious or diagnostic.  He is referred to [facility] charity care, and provided with a paper copy of the application, as he is currently uninsured.  He is also given instructions to contact [facility] urology for further evaluation management.  Return precautions have been discussed per orders provided as requested.  JEWEL VENDITTO was evaluated in Emergency Department on 07/16/2020 for the symptoms described in the history of present illness. He was evaluated in the context of the global COVID-19 pandemic, which necessitated consideration that the patient might be at risk for infection with the SARS-CoV-2 virus that causes COVID-19. Institutional protocols and algorithms that pertain to the evaluation of patients at  risk for COVID-19 are in a state of rapid change based on information released by regulatory bodies including the CDC and federal and state organizations. These policies and algorithms were followed during the patient's care in the ED. ____________________________________________  FINAL CLINICAL IMPRESSION(S) / ED DIAGNOSES  Final diagnoses:  Scrotal mass      Lissa Hoard, PA-C 07/16/20 1722    Sharman Cheek, MD 07/18/20 1530

## 2020-07-19 ENCOUNTER — Encounter: Payer: Self-pay | Admitting: Emergency Medicine

## 2020-07-19 ENCOUNTER — Other Ambulatory Visit: Payer: Self-pay

## 2020-07-19 ENCOUNTER — Emergency Department
Admission: EM | Admit: 2020-07-19 | Discharge: 2020-07-19 | Disposition: A | Discharge status: Home / Self Care | Payer: Medicaid Other | Attending: Emergency Medicine | Admitting: Emergency Medicine

## 2020-07-19 DIAGNOSIS — I10 Essential (primary) hypertension: Secondary | ICD-10-CM | POA: Insufficient documentation

## 2020-07-19 DIAGNOSIS — N509 Disorder of male genital organs, unspecified: Secondary | ICD-10-CM | POA: Insufficient documentation

## 2020-07-19 DIAGNOSIS — F17210 Nicotine dependence, cigarettes, uncomplicated: Secondary | ICD-10-CM | POA: Insufficient documentation

## 2020-07-19 DIAGNOSIS — Z7982 Long term (current) use of aspirin: Secondary | ICD-10-CM | POA: Insufficient documentation

## 2020-07-19 DIAGNOSIS — N50819 Testicular pain, unspecified: Secondary | ICD-10-CM | POA: Insufficient documentation

## 2020-07-19 DIAGNOSIS — N5089 Other specified disorders of the male genital organs: Secondary | ICD-10-CM

## 2020-07-19 MED ORDER — NAPROXEN 500 MG PO TABS: 500.0000 mg | ORAL_TABLET | Freq: Two times a day (BID) | ORAL | Status: DC

## 2020-07-19 MED ORDER — TRAMADOL HCL 50 MG PO TABS: 50.0000 mg | ORAL_TABLET | Freq: Two times a day (BID) | ORAL | 0 refills | Status: DC

## 2020-07-19 MED ORDER — LIDOCAINE 5 % EX PTCH
1.0000 | MEDICATED_PATCH | CUTANEOUS | Status: DC
  Administered 2020-07-19: 13:00:00 1 via TRANSDERMAL
  Filled 2020-07-19 (×2): qty 1

## 2020-07-19 MED ORDER — LIDOCAINE 5 % EX PTCH: 1.0000 | MEDICATED_PATCH | CUTANEOUS | Status: DC

## 2020-07-19 MED ORDER — NAPROXEN 500 MG PO TABS
500.0000 mg | ORAL_TABLET | Freq: Two times a day (BID) | ORAL | Status: DC
Start: 2020-07-19 — End: 2020-12-19

## 2020-07-19 MED ORDER — LIDOCAINE 5 % EX PTCH: 1.0000 | MEDICATED_PATCH | Freq: Two times a day (BID) | CUTANEOUS | 0 refills | Status: DC

## 2020-07-19 NOTE — ED Triage Notes (Signed)
Pt reports abscess on his scrotum for last couple of days. Pt reports painful

## 2020-07-19 NOTE — Discharge Instructions (Addendum)
Follow discharge care instructions and take medication as directed. Advised to complete application for charity care. Call urologist and schedule appointment.

## 2020-07-19 NOTE — ED Provider Notes (Signed)
[facility] Emergency Department Provider Note   ____________________________________________   Event Date/Time   First MD Initiated Contact with Patient 07/19/20 1216     (approximate)  I have reviewed the triage vital signs and the nursing notes.   HISTORY  Chief Complaint Abscess    HPI Lee Sparks is a 49 y.o. male patient presents for scrotum lesion for the past week. Patient was seen this facility 3 days ago and ultrasound reveals nonspecific vascular subcutaneous scrotum lesion. The lesion was extratesticular. Patient was advised to follow-up with his urology. Patient did not complete the charity care paperwork and has not contacted  urologist. Presents today secondary to a satellite lesion of the same consistency. Patient states mild pain is now present.        Past Medical History:  Diagnosis Date  . Alcohol-induced pancreatitis   . Anemia   . Bronchitis     Patient Active Problem List   Diagnosis Date Noted  . Pancreatitis 05/03/2017  . Acute pancreatitis 05/01/2017  . Thrombocytopenia (HCC) 07/29/2011  . Cocaine abuse (HCC) 07/28/2011  . Upper GI bleeding 07/27/2011  . Tachycardia 07/27/2011    Class: Acute  . Hypertension 07/27/2011    Class: Acute  . Alcohol withdrawal syndrome (HCC) 07/27/2011    Class: Acute  . Abdominal pain 07/27/2011    Class: Acute    Past Surgical History:  Procedure Laterality Date  . CYSTECTOMY    . ESOPHAGOGASTRODUODENOSCOPY  07/28/2011   Procedure: ESOPHAGOGASTRODUODENOSCOPY (EGD);  Surgeon: Freddy Jaksch, MD;  Location[facility] ENDOSCOPY;  Service: Endoscopy;  Laterality: N/A;  . HERNIA REPAIR      Prior to Admission medications   Medication Sig Start Date End Date Taking? Authorizing Provider  acetaminophen (TYLENOL) 500 MG tablet Take 2 tablets (1,000 mg total) by mouth every 12 (twelve) hours as needed for moderate pain. 05/10/20   Iloabachie, Chioma E, NP  albuterol (PROVENTIL  HFA;VENTOLIN HFA) 108 (90 Base) MCG/ACT inhaler Inhale 2 puffs into the lungs every 4 (four) hours as needed for wheezing or shortness of breath. Patient not taking: Reported on 04/23/2020 06/17/17   Chinita Pester, FNP  aspirin EC 81 MG tablet Take 1 tablet (81 mg total) by mouth daily. Swallow whole. 04/23/20 07/22/20  Phineas Semen, MD  cetirizine (ZYRTEC) 5 MG tablet Take 1 tablet (5 mg total) by mouth daily. 05/10/20   Iloabachie, Chioma E, NP  lidocaine (LIDODERM) 5 % Place 1 patch onto the skin every 12 (twelve) hours. Remove & Discard patch within 12 hours or as directed by MD 07/19/20 07/19/21  Joni Reining, PA-C  naproxen (NAPROSYN) 500 MG tablet Take 1 tablet (500 mg total) by mouth 2 (two) times daily with a meal. 07/19/20   Joni Reining, PA-C    Allergies Grapefruit diet or [extra strength grapefruit]  Family History  Problem Relation Age of Onset  . Hypertension Father   . Aneurysm Father   . Cancer Other   . Hypertension Mother   . Pneumonia Mother     Social History Social History   Tobacco Use  . Smoking status: Current Every Day Smoker    Packs/day: 0.50    Years: 15.00    Pack years: 7.50    Types: Cigarettes    Last attempt to quit: 06/27/2011    Years since quitting: 9.0  . Smokeless tobacco: Never Used  Substance Use Topics  . Alcohol use: Yes    Alcohol/week: 50.0 standard drinks  Types: 50 Cans of beer per week    Comment: Equivalent of two 40 oz beers daily.  . Drug use: No    Review of Systems  Constitutional: No fever/chills Eyes: No visual changes. ENT: No sore throat. Cardiovascular: Denies chest pain. Respiratory: Denies shortness of breath. Gastrointestinal: No abdominal pain.  No nausea, no vomiting.  No diarrhea.  No constipation. Genitourinary: Negative for dysuria. Musculoskeletal: Negative for back pain. Skin: Negative for rash. Neurological: Negative for headaches, focal weakness or numbness.    ____________________________________________   PHYSICAL EXAM:  VITAL SIGNS: ED Triage Vitals  Enc Vitals Group     BP 07/19/20 0857 116/83     Pulse Rate 07/19/20 0857 94     Resp 07/19/20 0857 20     Temp 07/19/20 0857 98.1 F (36.7 C)     Temp Source 07/19/20 0857 Oral     SpO2 07/19/20 0857 99 %     Weight 07/19/20 0856 180 lb (81.6 kg)     Height 07/19/20 0856 5\' 9"  (1.753 m)     Head Circumference --      Peak Flow --      Pain Score 07/19/20 0855 10     Pain Loc --      Pain Edu? --      Excl. in GC? --     Constitutional: Alert and oriented. Well appearing and in no acute distress. Cardiovascular: Normal rate, regular rhythm. Grossly normal heart sounds.  Good peripheral circulation. Respiratory: Normal respiratory effort.  No retractions. Lungs CTAB. Genitourinary: 2 mobile well-circumscribed subcutaneous lesions to the right scrotum. Musculoskeletal: No lower extremity tenderness nor edema.  No joint effusions. Neurologic:  Normal speech and language. No gross focal neurologic deficits are appreciated. No gait instability. Skin:  Skin is warm, dry and intact. No rash noted. No abrasion, ecchymosis, edema. Psychiatric: Mood and affect are normal. Speech and behavior are normal.  ____________________________________________   LABS (all labs ordered are listed, but only abnormal results are displayed)  Labs Reviewed - No data to display ____________________________________________  EKG   ____________________________________________  RADIOLOGY I, 07/21/20, personally viewed and evaluated these images (plain radiographs) as part of my medical decision making, as well as reviewing the written report by the radiologist.  ED MD interpretation:    Official radiology report(s): No results found.  ____________________________________________   PROCEDURES  Procedure(s) performed (including Critical  Care):  Procedures   ____________________________________________   INITIAL IMPRESSION / ASSESSMENT AND PLAN / ED COURSE  As part of my medical decision making, I reviewed the following data within the electronic MEDICAL RECORD NUMBER         Patient presents with testicular pain secondary to to scrotum lesions. Patient given prescription for Lidoderm to apply daily 24 hours as directed. Patient also given prescription for naproxen. Patient advised to contact urology in 5 days. Patient also advised to complete charity care information to obtain coverage for this complaint.      ____________________________________________   FINAL CLINICAL IMPRESSION(S) / ED DIAGNOSES  Final diagnoses:  Scrotal mass       *Please note:  Lee Sparks was evaluated in Emergency Department on 07/19/2020 for the symptoms described in the history of present illness. He was evaluated in the context of the global COVID-19 pandemic, which necessitated consideration that the patient might be at risk for infection with the SARS-CoV-2 virus that causes COVID-19. Institutional protocols and algorithms that pertain to the evaluation of patients at risk  for COVID-19 are in a state of rapid change based on information released by regulatory bodies including the CDC and federal and state organizations. These policies and algorithms were followed during the patient's care in the ED.  Some ED evaluations and interventions may be delayed as a result of limited staffing during and the pandemic.*   Note:  This document was prepared using Dragon voice recognition software and may include unintentional dictation errors.    Joni Reining, PA-C 07/19/20 1257    Dionne Bucy, MD 07/19/20 [phone]

## 2020-07-31 ENCOUNTER — Emergency Department
Admission: EM | Admit: 2020-07-31 | Discharge: 2020-07-31 | Disposition: A | Discharge status: Home / Self Care | Payer: Medicaid Other | Attending: Emergency Medicine | Admitting: Emergency Medicine

## 2020-07-31 ENCOUNTER — Other Ambulatory Visit: Payer: Self-pay

## 2020-07-31 DIAGNOSIS — I10 Essential (primary) hypertension: Secondary | ICD-10-CM | POA: Insufficient documentation

## 2020-07-31 DIAGNOSIS — F17210 Nicotine dependence, cigarettes, uncomplicated: Secondary | ICD-10-CM | POA: Insufficient documentation

## 2020-07-31 DIAGNOSIS — R251 Tremor, unspecified: Secondary | ICD-10-CM | POA: Insufficient documentation

## 2020-07-31 DIAGNOSIS — E876 Hypokalemia: Secondary | ICD-10-CM | POA: Insufficient documentation

## 2020-07-31 DIAGNOSIS — F10230 Alcohol dependence with withdrawal, uncomplicated: Secondary | ICD-10-CM

## 2020-07-31 LAB — URINE DRUG SCREEN, QUALITATIVE (ARMC ONLY)
Amphetamines, Ur Screen: NOT DETECTED
Barbiturates, Ur Screen: NOT DETECTED
Benzodiazepine, Ur Scrn: NOT DETECTED
Cannabinoid 50 Ng, Ur ~~LOC~~: NOT DETECTED
Cocaine Metabolite,Ur ~~LOC~~: NOT DETECTED
MDMA (Ecstasy)Ur Screen: NOT DETECTED
Methadone Scn, Ur: NOT DETECTED
Opiate, Ur Screen: NOT DETECTED
Phencyclidine (PCP) Ur S: NOT DETECTED
Tricyclic, Ur Screen: NOT DETECTED

## 2020-07-31 LAB — COMPREHENSIVE METABOLIC PANEL
ALT: 19 U/L (ref 0–44)
AST: 37 U/L (ref 15–41)
Albumin: 4 g/dL (ref 3.5–5.0)
Alkaline Phosphatase: 48 U/L (ref 38–126)
Anion gap: 11 (ref 5–15)
BUN: 10 mg/dL (ref 6–20)
CO2: 21 mmol/L — ABNORMAL LOW (ref 22–32)
Calcium: 9.4 mg/dL (ref 8.9–10.3)
Chloride: 104 mmol/L (ref 98–111)
Creatinine, Ser: 0.61 mg/dL (ref 0.61–1.24)
GFR, Estimated: >60 mL/min (ref >60)
Glucose, Bld: 123 mg/dL — ABNORMAL HIGH (ref 70–99)
Potassium: 3.3 mmol/L — ABNORMAL LOW (ref 3.5–5.1)
Sodium: 136 mmol/L (ref 135–145)
Total Bilirubin: 2.3 mg/dL — ABNORMAL HIGH (ref 0.3–1.2)
Total Protein: 7 g/dL (ref 6.5–8.1)

## 2020-07-31 LAB — CBC
HCT: 45.4 % (ref 39.0–52.0)
Hemoglobin: 16 g/dL (ref 13.0–17.0)
MCH: 28.9 pg (ref 26.0–34.0)
MCHC: 35.2 g/dL (ref 30.0–36.0)
MCV: 81.9 fL (ref 80.0–100.0)
Platelets: 177 10*3/uL (ref 150–400)
RBC: 5.54 MIL/uL (ref 4.22–5.81)
RDW: 13.2 % (ref 11.5–15.5)
WBC: 10.9 10*3/uL — ABNORMAL HIGH (ref 4.0–10.5)
nRBC: 0 % (ref 0.0–0.2)

## 2020-07-31 LAB — ETHANOL: Alcohol, Ethyl (B): <10 mg/dL (ref <10)

## 2020-07-31 MED ORDER — LORAZEPAM 2 MG/ML IJ SOLN: 0.0000 mg | Freq: Two times a day (BID) | INTRAMUSCULAR | Status: DC

## 2020-07-31 MED ORDER — THIAMINE HCL 100 MG PO TABS
100.0000 mg | ORAL_TABLET | Freq: Every day | ORAL | Status: DC
  Administered 2020-07-31: 100 mg via ORAL
  Filled 2020-07-31: qty 1

## 2020-07-31 MED ORDER — LORAZEPAM 2 MG/ML IJ SOLN: 0.0000 mg | Freq: Four times a day (QID) | INTRAMUSCULAR | Status: DC

## 2020-07-31 MED ORDER — THIAMINE HCL 100 MG/ML IJ SOLN: 100.0000 mg | Freq: Every day | INTRAMUSCULAR | Status: DC

## 2020-07-31 MED ORDER — LORAZEPAM 2 MG PO TABS
0.0000 mg | ORAL_TABLET | Freq: Four times a day (QID) | ORAL | Status: DC
  Administered 2020-07-31: 2 mg via ORAL
  Filled 2020-07-31: qty 1

## 2020-07-31 MED ORDER — CHLORDIAZEPOXIDE HCL 25 MG PO CAPS: ORAL_CAPSULE | ORAL | 0 refills | Status: AC

## 2020-07-31 MED ORDER — LORAZEPAM 2 MG PO TABS: 0.0000 mg | ORAL_TABLET | Freq: Two times a day (BID) | ORAL | Status: DC

## 2020-07-31 NOTE — Discharge Instructions (Addendum)
Do NOT drink alcohol while taking Librium. Doing so can put you at high risk for respiratory depression, cardiac arrest, and death.  I recommend considering an over-the-counter multivitamin  Take the medication as prescribed for withdrawal

## 2020-07-31 NOTE — ED Triage Notes (Signed)
Pt comes with needing to detox from alcohol. Pt states he went to the [facility] and they told him he needed to come here for detox. Pt states he last drank yesterday. Pt states he drinks wine and beer everyday. Pt states 2-3 bottles of wine and 4 24oz beers. Pt states he drinks pretty heavy for the last week. Pt states some diarrhea.  Pt denies any drug use. Pt denies any SI or HI.

## 2020-07-31 NOTE — ED Notes (Signed)
Discharged back to [employer]. Able to eat and drink without emesis.  Pt alert and oriented X4, cooperative, RR even and unlabored, color WNL. Pt in NAD.

## 2020-07-31 NOTE — ED Provider Notes (Signed)
[facility] Emergency Department Provider Note  ____________________________________________   Event Date/Time   First MD Initiated Contact with Patient 07/31/20 1040     (approximate)  I have reviewed the triage vital signs and the nursing notes.   HISTORY  Chief Complaint detox    HPI Lee Sparks is a 50 y.o. male with past medical history as below here with desire for detox.  Patient states he has regularly used alcohol for several years.  He has a history of chronic alcohol as well as cocaine abuse.  He was last in rehab approximately 6 months ago.  He states that he went to [facility] and was told to come here for medical assistance with detox.  Last drink was approximately 24 hours ago.  He has some mild tremors.  No known history of seizures.  Denies any hallucinations.  No nausea or vomiting.  No diarrhea.  No other drug use currently.  Denies any pain.  No dysphoria or depressed mood.  He is interested in outpatient treatment at this time.  No other complaints.        Past Medical History:  Diagnosis Date  . Alcohol-induced pancreatitis   . Anemia   . Bronchitis     Patient Active Problem List   Diagnosis Date Noted  . Pancreatitis 05/03/2017  . Acute pancreatitis 05/01/2017  . Thrombocytopenia (HCC) 07/29/2011  . Cocaine abuse (HCC) 07/28/2011  . Upper GI bleeding 07/27/2011  . Tachycardia 07/27/2011    Class: Acute  . Hypertension 07/27/2011    Class: Acute  . Alcohol withdrawal syndrome (HCC) 07/27/2011    Class: Acute  . Abdominal pain 07/27/2011    Class: Acute    Past Surgical History:  Procedure Laterality Date  . CYSTECTOMY    . ESOPHAGOGASTRODUODENOSCOPY  07/28/2011   Procedure: ESOPHAGOGASTRODUODENOSCOPY (EGD);  Surgeon: Freddy Jaksch, MD;  Location: [facility] ENDOSCOPY;  Service: Endoscopy;  Laterality: N/A;  . HERNIA REPAIR      Prior to Admission medications   Medication Sig Start Date End Date Taking?  Authorizing Provider  chlordiazePOXIDE (LIBRIUM) 25 MG capsule Take 2 capsules (50 mg total) by mouth 3 (three) times daily for 1 day, THEN 1 capsule (25 mg total) 3 (three) times daily for 2 days, THEN 1 capsule (25 mg total) in the morning and at bedtime for 2 days, THEN 1 capsule (25 mg total) at bedtime for 1 day. 07/31/20 08/06/20 Yes Shaune Pollack, MD  acetaminophen (TYLENOL) 500 MG tablet Take 2 tablets (1,000 mg total) by mouth every 12 (twelve) hours as needed for moderate pain. 05/10/20   Iloabachie, Chioma E, NP  albuterol (PROVENTIL HFA;VENTOLIN HFA) 108 (90 Base) MCG/ACT inhaler Inhale 2 puffs into the lungs every 4 (four) hours as needed for wheezing or shortness of breath. Patient not taking: Reported on 04/23/2020 06/17/17   Kem Boroughs B, FNP  cetirizine (ZYRTEC) 5 MG tablet Take 1 tablet (5 mg total) by mouth daily. 05/10/20   Iloabachie, Chioma E, NP  lidocaine (LIDODERM) 5 % Place 1 patch onto the skin every 12 (twelve) hours. Remove & Discard patch within 12 hours or as directed by MD 07/19/20 07/19/21  Joni Reining, PA-C  naproxen (NAPROSYN) 500 MG tablet Take 1 tablet (500 mg total) by mouth 2 (two) times daily with a meal. 07/19/20   Joni Reining, PA-C    Allergies Grapefruit diet or [extra strength grapefruit]  Family History  Problem Relation Age of Onset  .  Hypertension Father   . Aneurysm Father   . Cancer Other   . Hypertension Mother   . Pneumonia Mother     Social History Social History   Tobacco Use  . Smoking status: Current Every Day Smoker    Packs/day: 0.50    Years: 15.00    Pack years: 7.50    Types: Cigarettes    Last attempt to quit: 06/27/2011    Years since quitting: 9.1  . Smokeless tobacco: Never Used  Substance Use Topics  . Alcohol use: Yes    Alcohol/week: 50.0 standard drinks    Types: 50 Cans of beer per week    Comment: Equivalent of two 40 oz beers daily.  . Drug use: No    Review of Systems  Review of Systems   Constitutional: Positive for fatigue. Negative for chills and fever.  HENT: Negative for sore throat.   Respiratory: Negative for shortness of breath.   Cardiovascular: Negative for chest pain.  Gastrointestinal: Positive for nausea. Negative for abdominal pain.  Genitourinary: Negative for flank pain.  Musculoskeletal: Negative for neck pain.  Skin: Negative for rash and wound.  Allergic/Immunologic: Negative for immunocompromised state.  Neurological: Positive for tremors and weakness. Negative for numbness.  Hematological: Does not bruise/bleed easily.  All other systems reviewed and are negative.    ____________________________________________  PHYSICAL EXAM:      VITAL SIGNS: ED Triage Vitals  Enc Vitals Group     BP 07/31/20 0704 (!) 161/97     Pulse Rate 07/31/20 0704 60     Resp 07/31/20 0704 18     Temp 07/31/20 0704 98.5 F (36.9 C)     Temp Source 07/31/20 0704 Oral     SpO2 07/31/20 0704 97 %     Weight 07/31/20 0704 180 lb (81.6 kg)     Height 07/31/20 0704 5\' 9"  (1.753 m)     Head Circumference --      Peak Flow --      Pain Score 07/31/20 0717 4     Pain Loc --      Pain Edu? --      Excl. in [place]? --      Physical Exam Vitals and nursing note reviewed.  Constitutional:      General: He is not in acute distress.    Appearance: He is well-developed.  HENT:     Head: Normocephalic and atraumatic.  Eyes:     Conjunctiva/sclera: Conjunctivae normal.  Cardiovascular:     Rate and Rhythm: Normal rate and regular rhythm.     Heart sounds: Normal heart sounds. No murmur heard. No friction rub.  Pulmonary:     Effort: Pulmonary effort is normal. No respiratory distress.     Breath sounds: Normal breath sounds. No wheezing or rales.  Abdominal:     General: There is no distension.     Palpations: Abdomen is soft.     Tenderness: There is no abdominal tenderness.  Musculoskeletal:     Cervical back: Neck supple.  Skin:    General: Skin is warm.      Capillary Refill: Capillary refill takes less than 2 seconds.  Neurological:     Mental Status: He is alert and oriented to person, place, and time.     Motor: No abnormal muscle tone.     Comments: Minimal coarse tremor.  Strength out of 5 bilateral upper and lower extremities.  Normal sensation light touch.       ____________________________________________  LABS (all labs ordered are listed, but only abnormal results are displayed)  Labs Reviewed  COMPREHENSIVE METABOLIC PANEL - Abnormal; Notable for the following components:      Result Value   Potassium 3.3 (*)    CO2 21 (*)    Glucose, Bld 123 (*)    Total Bilirubin 2.3 (*)    All other components within normal limits  CBC - Abnormal; Notable for the following components:   WBC 10.9 (*)    All other components within normal limits  ETHANOL  URINE DRUG SCREEN, QUALITATIVE ([facility] ONLY)    ____________________________________________  EKG:  ________________________________________  RADIOLOGY All imaging, including plain films, CT scans, and ultrasounds, independently reviewed by me, and interpretations confirmed via formal radiology reads.  ED MD interpretation:     Official radiology report(s): No results found.  ____________________________________________  PROCEDURES   Procedure(s) performed (including Critical Care):  Procedures  ____________________________________________  INITIAL IMPRESSION / MDM / ASSESSMENT AND PLAN / ED COURSE  As part of my medical decision making, I reviewed the following data within the electronic MEDICAL RECORD NUMBER Nursing notes reviewed and incorporated, Old chart reviewed, Notes from prior ED visits, and [place] Controlled Substance Database       *ERSKINE STEINFELDT was evaluated in Emergency Department on 07/31/2020 for the symptoms described in the history of present illness. He was evaluated in the context of the global COVID-19 pandemic, which necessitated consideration that  the patient might be at risk for infection with the SARS-CoV-2 virus that causes COVID-19. Institutional protocols and algorithms that pertain to the evaluation of patients at risk for COVID-19 are in a state of rapid change based on information released by regulatory bodies including the CDC and federal and state organizations. These policies and algorithms were followed during the patient's care in the ED.  Some ED evaluations and interventions may be delayed as a result of limited staffing during the pandemic.*     Medical Decision Making: Pleasant 50 year old male here with desire for alcohol detox.  He is not clinically intoxicated.  Screening lab work is unremarkable.  EtOH negative.  Electrolytes largely unremarkable.  Mild hypokalemia is likely dietary.  No SI, HI, or auditory visual hallucinations.  He has no history of seizures, DTs, or complicated withdrawal.  I had TTS discussed with him and he does not desire inpatient treatment or placement at this time.  We discussed the risks and benefits of Librium taper and he states he has tolerated this well in the past.  He understands that he cannot drink while taking this.  Will discharge with outpatient Librium taper, outpatient referral to [facility].  ____________________________________________  FINAL CLINICAL IMPRESSION(S) / ED DIAGNOSES  Final diagnoses:  Alcohol dependence with uncomplicated withdrawal (HCC)     MEDICATIONS GIVEN DURING THIS VISIT:  Medications  LORazepam (ATIVAN) injection 0-4 mg ( Intravenous See Alternative 07/31/20 1141)    Or  LORazepam (ATIVAN) tablet 0-4 mg (2 mg Oral Given 07/31/20 1141)  LORazepam (ATIVAN) injection 0-4 mg (has no administration in time range)    Or  LORazepam (ATIVAN) tablet 0-4 mg (has no administration in time range)  thiamine tablet 100 mg (100 mg Oral Given 07/31/20 1141)    Or  thiamine (B-1) injection 100 mg ( Intravenous See Alternative 07/31/20 1141)     ED Discharge Orders          Ordered    chlordiazePOXIDE (LIBRIUM) 25 MG capsule        07/31/20  1221           Note:  This document was prepared using Dragon voice recognition software and may include unintentional dictation errors.   Shaune Pollack, MD 07/31/20 [phone]

## 2020-07-31 NOTE — ED Notes (Signed)
Going back to [employer].

## 2020-07-31 NOTE — BH Assessment (Signed)
Comprehensive Clinical Assessment (CCA) Screening, Triage and Referral Note  07/31/2020 Lee Sparks [id number]    Lee Sparks is a 50 y.o male who presents to [facility] ED voluntarily for treatment. Per triage note, Pt comes with needing to detox from alcohol. Pt states he went to the [facility] and they told him he needed to come here for detox. Pt states he last drank yesterday. Pt states he drinks wine and beer every day. Pt states 2-3 bottles of wine and 4 24oz beers. Pt states he drinks pretty heavy for the last week. Pt states some diarrhea. Pt denies any drug use. Pt denies any SI or HI.  During TTS assessment pt presents alert and oriented x 4, restless but cooperative, and mood-congruent with affect. The pt does not appear to be responding to internal or external stimuli. Neither is the pt presenting with any delusional thinking. Pt verified the information provided to triage RN. Pt identified his main complaint to be the need to detox. Pt states "I am connected to the [facility], they told me to come here to detox and when I am cleared, they will pick me up". Pt reports an increase in his alcohol intake the last two weeks due to the holidays. Pt identified the holidays to be a trigger but was unclear why. Pt reports to endorse the following withdrawal symptoms (shakes, nausea, insomnia, decreased appetite). Pt reports to drink daily stating, "it's usually just a little but lately its been more". Pt reports a family hx of SA (deceased parents & siblings). Pt denies a MH hx or family hx of MH. Pt denies any SI/HI/AH/VH and contracted for safety. Pt refused resources for SA treatment stating, "I am fine with the [facility] I just need to detox here".   TTS updated Dr. Erma Heritage who agreed to discharge pt once he is medically cleared  Chief Complaint:  Chief Complaint  Patient presents with  . detox   Visit Diagnosis: Alcohol Withdrawal  Patient Reported  Information How did you hear about [place]? Self   Referral name: No data recorded  Referral phone number: No data recorded Whom do you see for routine medical problems? I don't have a doctor   Practice/Facility Name: No data recorded  Practice/Facility Phone Number: No data recorded  Name of Contact: No data recorded  Contact Number: No data recorded  Contact Fax Number: No data recorded  Prescriber Name: No data recorded  Prescriber Address (if known): No data recorded What Is the Reason for Your Visit/Call Today? Detox  How Long Has This Been Causing You Problems? <Week  Have You Recently Been in Any Inpatient Treatment (Hospital/Detox/Crisis Center/28-Day Program)? No   Name/Location of Program/Hospital:No data recorded  How Long Were You There? No data recorded  When Were You Discharged? No data recorded Have You Ever Received Services From [facility] Before? No   Who Do You See at [facility]? No data recorded Have You Recently Had Any Thoughts About Hurting Yourself? No   Are You Planning to Commit Suicide/Harm Yourself At This time?  No  Have you Recently Had Thoughts About Hurting Someone Karolee Ohs? No   Explanation: No data recorded Have You Used Any Alcohol or Drugs in the Past 24 Hours? Yes   How Long Ago Did You Use Drugs or Alcohol?  No data recorded  What Did You Use and How Much? Alcohol; 3-4 bottle of wine & 4 20 ounce beers  What Do You Feel Would  Help You the Most Today? Other (Comment) (detox)  Do You Currently Have a Therapist/Psychiatrist? No   Name of Therapist/Psychiatrist: No data recorded  Have You Been Recently Discharged From Any Office Practice or Programs? No   Explanation of Discharge From Practice/Program:  No data recorded    CCA Screening Triage Referral Assessment Type of Contact: Face-to-Face   Is this Initial or Reassessment? No data recorded  Date Telepsych consult ordered in [facility]:  No data recorded  Time Telepsych consult ordered in  [facility]:  No data recorded Patient Reported Information Reviewed? Yes   Patient Left Without Being Seen? No data recorded  Reason for Not Completing Assessment: No data recorded Collateral Involvement: None provided  Does Patient Have a Court Appointed Legal Guardian? No data recorded  Name and Contact of Legal Guardian:  No data recorded If Minor and Not Living with Parent(s), Who has Custody? No data recorded Is CPS involved or ever been involved? Never  Is APS involved or ever been involved? Never  Patient Determined To Be At Risk for Harm To Self or Others Based on Review of Patient Reported Information or Presenting Complaint? No   Method: No data recorded  Availability of Means: No data recorded  Intent: No data recorded  Notification Required: No data recorded  Additional Information for Danger to Others Potential:  No data recorded  Additional Comments for Danger to Others Potential:  No data recorded  Are There Guns or Other Weapons in Your Home?  No data recorded   Types of Guns/Weapons: No data recorded   Are These Weapons Safely Secured?                              No data recorded   Who Could Verify You Are Able To Have These Secured:    No data recorded Do You Have any Outstanding Charges, Pending Court Dates, Parole/Probation? No data recorded Contacted To Inform of Risk of Harm To Self or Others: No data recorded Location of Assessment: [facility] ED  Does Patient Present under Involuntary Commitment? No   IVC Papers Initial File Date: No data recorded  [place] of Residence: [place]  Patient Currently Receiving the Following Services: Not Receiving Services   Determination of Need: Emergent (2 hours)   Options For Referral: Outpatient Therapy   Opal Sidles, LCSWA

## 2020-09-13 ENCOUNTER — Telehealth: Payer: Self-pay | Admitting: Gerontology

## 2020-09-13 NOTE — Telephone Encounter (Signed)
LVM to call back to schedule a f/u @5  pm on 2/17-KW

## 2020-10-30 ENCOUNTER — Emergency Department
Admission: EM | Admit: 2020-10-30 | Discharge: 2020-11-01 | Disposition: A | Discharge status: Home / Self Care | Payer: Medicaid Other | Attending: Emergency Medicine | Admitting: Emergency Medicine

## 2020-10-30 ENCOUNTER — Emergency Department: Payer: Medicaid Other

## 2020-10-30 ENCOUNTER — Encounter: Payer: Self-pay | Admitting: Radiology

## 2020-10-30 ENCOUNTER — Other Ambulatory Visit: Payer: Self-pay

## 2020-10-30 DIAGNOSIS — R0602 Shortness of breath: Secondary | ICD-10-CM | POA: Insufficient documentation

## 2020-10-30 DIAGNOSIS — F10939 Alcohol use, unspecified with withdrawal, unspecified: Secondary | ICD-10-CM | POA: Diagnosis present

## 2020-10-30 DIAGNOSIS — F1014 Alcohol abuse with alcohol-induced mood disorder: Secondary | ICD-10-CM | POA: Insufficient documentation

## 2020-10-30 DIAGNOSIS — R000 Tachycardia, unspecified: Secondary | ICD-10-CM | POA: Insufficient documentation

## 2020-10-30 DIAGNOSIS — R079 Chest pain, unspecified: Secondary | ICD-10-CM | POA: Insufficient documentation

## 2020-10-30 DIAGNOSIS — F17210 Nicotine dependence, cigarettes, uncomplicated: Secondary | ICD-10-CM | POA: Insufficient documentation

## 2020-10-30 DIAGNOSIS — Z20822 Contact with and (suspected) exposure to covid-19: Secondary | ICD-10-CM | POA: Insufficient documentation

## 2020-10-30 DIAGNOSIS — F10239 Alcohol dependence with withdrawal, unspecified: Secondary | ICD-10-CM | POA: Diagnosis present

## 2020-10-30 DIAGNOSIS — Y908 Blood alcohol level of 240 mg/100 ml or more: Secondary | ICD-10-CM | POA: Insufficient documentation

## 2020-10-30 DIAGNOSIS — R1013 Epigastric pain: Secondary | ICD-10-CM | POA: Insufficient documentation

## 2020-10-30 DIAGNOSIS — F1020 Alcohol dependence, uncomplicated: Secondary | ICD-10-CM

## 2020-10-30 DIAGNOSIS — F1010 Alcohol abuse, uncomplicated: Secondary | ICD-10-CM

## 2020-10-30 DIAGNOSIS — F332 Major depressive disorder, recurrent severe without psychotic features: Secondary | ICD-10-CM

## 2020-10-30 LAB — COMPREHENSIVE METABOLIC PANEL
ALT: 53 U/L — ABNORMAL HIGH (ref 0–44)
AST: 50 U/L — ABNORMAL HIGH (ref 15–41)
Albumin: 4.1 g/dL (ref 3.5–5.0)
Alkaline Phosphatase: 51 U/L (ref 38–126)
Anion gap: 15 (ref 5–15)
BUN: 10 mg/dL (ref 6–20)
CO2: 20 mmol/L — ABNORMAL LOW (ref 22–32)
Calcium: 8.5 mg/dL — ABNORMAL LOW (ref 8.9–10.3)
Chloride: 97 mmol/L — ABNORMAL LOW (ref 98–111)
Creatinine, Ser: 0.77 mg/dL (ref 0.61–1.24)
GFR, Estimated: >60 mL/min (ref >60)
Glucose, Bld: 170 mg/dL — ABNORMAL HIGH (ref 70–99)
Potassium: 3.7 mmol/L (ref 3.5–5.1)
Sodium: 132 mmol/L — ABNORMAL LOW (ref 135–145)
Total Bilirubin: 0.9 mg/dL (ref 0.3–1.2)
Total Protein: 7.4 g/dL (ref 6.5–8.1)

## 2020-10-30 LAB — CBC WITH DIFFERENTIAL/PLATELET
Abs Immature Granulocytes: 0.05 10*3/uL (ref 0.00–0.07)
Basophils Absolute: 0.1 10*3/uL (ref 0.0–0.1)
Basophils Relative: 1 %
Eosinophils Absolute: 0.1 10*3/uL (ref 0.0–0.5)
Eosinophils Relative: 1 %
HCT: 46.1 % (ref 39.0–52.0)
Hemoglobin: 16 g/dL (ref 13.0–17.0)
Immature Granulocytes: 0 %
Lymphocytes Relative: 30 %
Lymphs Abs: 4.1 10*3/uL — ABNORMAL HIGH (ref 0.7–4.0)
MCH: 29.3 pg (ref 26.0–34.0)
MCHC: 34.7 g/dL (ref 30.0–36.0)
MCV: 84.3 fL (ref 80.0–100.0)
Monocytes Absolute: 0.9 10*3/uL (ref 0.1–1.0)
Monocytes Relative: 6 %
Neutro Abs: 8.6 10*3/uL — ABNORMAL HIGH (ref 1.7–7.7)
Neutrophils Relative %: 62 %
Platelets: 239 10*3/uL (ref 150–400)
RBC: 5.47 MIL/uL (ref 4.22–5.81)
RDW: 15.1 % (ref 11.5–15.5)
WBC: 13.8 10*3/uL — ABNORMAL HIGH (ref 4.0–10.5)
nRBC: 0 % (ref 0.0–0.2)

## 2020-10-30 LAB — URINE DRUG SCREEN, QUALITATIVE (ARMC ONLY)
Amphetamines, Ur Screen: NOT DETECTED
Barbiturates, Ur Screen: NOT DETECTED
Benzodiazepine, Ur Scrn: NOT DETECTED
Cannabinoid 50 Ng, Ur ~~LOC~~: NOT DETECTED
Cocaine Metabolite,Ur ~~LOC~~: NOT DETECTED
MDMA (Ecstasy)Ur Screen: NOT DETECTED
Methadone Scn, Ur: NOT DETECTED
Opiate, Ur Screen: NOT DETECTED
Phencyclidine (PCP) Ur S: NOT DETECTED
Tricyclic, Ur Screen: NOT DETECTED

## 2020-10-30 LAB — URINALYSIS, COMPLETE (UACMP) WITH MICROSCOPIC
Bacteria, UA: NONE SEEN
Bilirubin Urine: NEGATIVE
Glucose, UA: NEGATIVE mg/dL
Ketones, ur: NEGATIVE mg/dL
Leukocytes,Ua: NEGATIVE
Nitrite: NEGATIVE
Protein, ur: NEGATIVE mg/dL
Specific Gravity, Urine: 1.003 — ABNORMAL LOW (ref 1.005–1.030)
Squamous Epithelial / HPF: NONE SEEN (ref 0–5)
pH: 6 (ref 5.0–8.0)

## 2020-10-30 LAB — RESP PANEL BY RT-PCR (FLU A&B, COVID) ARPGX2
Influenza A by PCR: NEGATIVE
Influenza B by PCR: NEGATIVE
SARS Coronavirus 2 by RT PCR: NEGATIVE

## 2020-10-30 LAB — LIPASE, BLOOD: Lipase: 45 U/L (ref 11–51)

## 2020-10-30 LAB — TROPONIN I (HIGH SENSITIVITY): Troponin I (High Sensitivity): 7 ng/L (ref <18)

## 2020-10-30 LAB — ETHANOL: Alcohol, Ethyl (B): 341 mg/dL (ref <10)

## 2020-10-30 LAB — SALICYLATE LEVEL: Salicylate Lvl: <7 mg/dL — ABNORMAL LOW (ref 7.0–30.0)

## 2020-10-30 LAB — T4, FREE: Free T4: 0.82 ng/dL (ref 0.61–1.12)

## 2020-10-30 LAB — CK: Total CK: 57 U/L (ref 49–397)

## 2020-10-30 LAB — ACETAMINOPHEN LEVEL: Acetaminophen (Tylenol), Serum: <10 ug/mL — ABNORMAL LOW (ref 10–30)

## 2020-10-30 LAB — TSH: TSH: 1.645 u[IU]/mL (ref 0.350–4.500)

## 2020-10-30 MED ORDER — ONDANSETRON HCL 4 MG/2ML IJ SOLN
4.0000 mg | Freq: Once | INTRAMUSCULAR | Status: AC
  Administered 2020-10-30: 4 mg via INTRAVENOUS
  Filled 2020-10-30: qty 2

## 2020-10-30 MED ORDER — IOHEXOL 350 MG/ML SOLN
100.0000 mL | Freq: Once | INTRAVENOUS | Status: AC | PRN
  Administered 2020-10-30: 100 mL via INTRAVENOUS

## 2020-10-30 MED ORDER — ACETAMINOPHEN 500 MG PO TABS
1000.0000 mg | ORAL_TABLET | Freq: Once | ORAL | Status: AC
  Administered 2020-10-30: 1000 mg via ORAL
  Filled 2020-10-30: qty 2

## 2020-10-30 MED ORDER — THIAMINE HCL 100 MG/ML IJ SOLN
100.0000 mg | Freq: Once | INTRAMUSCULAR | Status: AC
  Administered 2020-10-30: 100 mg via INTRAVENOUS
  Filled 2020-10-30: qty 2

## 2020-10-30 MED ORDER — FOLIC ACID 1 MG PO TABS
1.0000 mg | ORAL_TABLET | Freq: Once | ORAL | Status: AC
  Administered 2020-10-30: 1 mg via ORAL
  Filled 2020-10-30: qty 1

## 2020-10-30 MED ORDER — SODIUM CHLORIDE 0.9 % IV BOLUS
1000.0000 mL | Freq: Once | INTRAVENOUS | Status: AC
  Administered 2020-10-30: 1000 mL via INTRAVENOUS

## 2020-10-30 MED ORDER — PANTOPRAZOLE SODIUM 40 MG IV SOLR
40.0000 mg | Freq: Once | INTRAVENOUS | Status: AC
  Administered 2020-10-30: 40 mg via INTRAVENOUS
  Filled 2020-10-30: qty 40

## 2020-10-30 NOTE — ED Provider Notes (Signed)
[facility] Emergency Department Provider Note  ____________________________________________   Event Date/Time   First MD Initiated Contact with Patient 10/30/20 2204     (approximate)  I have reviewed the triage vital signs and the nursing notes.   HISTORY  Chief Complaint Chest Pain    HPI Lee Sparks is a 50 y.o. male with alcohol abuse who comes in with chest pain.  Patient reports being on a bender.  Patient states that he is drank a 12 pack for multiple days.  Patient states that today he developed chest pain and pain with breathing and shortness of breath.  Is severe, constant, nothing makes it better, nothing makes it worse.  Patient was initially tachycardic with EMS into the 140s appeared sinus.  Patient was given 324 of aspirin and 3 nitro spray.  Patient also reporting abdominal pain as well.       Past Medical History:  Diagnosis Date  . Alcohol-induced pancreatitis   . Anemia   . Bronchitis     Patient Active Problem List   Diagnosis Date Noted  . Pancreatitis 05/03/2017  . Acute pancreatitis 05/01/2017  . Thrombocytopenia (HCC) 07/29/2011  . Cocaine abuse (HCC) 07/28/2011  . Upper GI bleeding 07/27/2011  . Tachycardia 07/27/2011    Class: Acute  . Hypertension 07/27/2011    Class: Acute  . Alcohol withdrawal syndrome (HCC) 07/27/2011    Class: Acute  . Abdominal pain 07/27/2011    Class: Acute    Past Surgical History:  Procedure Laterality Date  . CYSTECTOMY    . ESOPHAGOGASTRODUODENOSCOPY  07/28/2011   Procedure: ESOPHAGOGASTRODUODENOSCOPY (EGD);  Surgeon: Freddy Jaksch, MD;  Location: [facility] ENDOSCOPY;  Service: Endoscopy;  Laterality: N/A;  . HERNIA REPAIR      Prior to Admission medications   Medication Sig Start Date End Date Taking? Authorizing Provider  acetaminophen (TYLENOL) 500 MG tablet Take 2 tablets (1,000 mg total) by mouth every 12 (twelve) hours as needed for moderate pain. 05/10/20   Iloabachie,  Chioma E, NP  albuterol (PROVENTIL HFA;VENTOLIN HFA) 108 (90 Base) MCG/ACT inhaler Inhale 2 puffs into the lungs every 4 (four) hours as needed for wheezing or shortness of breath. Patient not taking: Reported on 04/23/2020 06/17/17   Kem Boroughs B, FNP  cetirizine (ZYRTEC) 5 MG tablet Take 1 tablet (5 mg total) by mouth daily. 05/10/20   Iloabachie, Chioma E, NP  lidocaine (LIDODERM) 5 % Place 1 patch onto the skin every 12 (twelve) hours. Remove & Discard patch within 12 hours or as directed by MD 07/19/20 07/19/21  Joni Reining, PA-C  naproxen (NAPROSYN) 500 MG tablet Take 1 tablet (500 mg total) by mouth 2 (two) times daily with a meal. 07/19/20   Joni Reining, PA-C    Allergies Grapefruit diet or [extra strength grapefruit]  Family History  Problem Relation Age of Onset  . Hypertension Father   . Aneurysm Father   . Cancer Other   . Hypertension Mother   . Pneumonia Mother     Social History Social History   Tobacco Use  . Smoking status: Current Every Day Smoker    Packs/day: 0.50    Years: 15.00    Pack years: 7.50    Types: Cigarettes    Last attempt to quit: 06/27/2011    Years since quitting: 9.3  . Smokeless tobacco: Never Used  Substance Use Topics  . Alcohol use: Yes    Alcohol/week: 50.0 standard drinks    Types:  50 Cans of beer per week    Comment: Equivalent of two 40 oz beers daily.  . Drug use: No      Review of Systems Constitutional: No fever/chills Eyes: No visual changes. ENT: No sore throat. Cardiovascular: chest pain  Respiratory: Positive shortness of breath Gastrointestinal: abd pain Genitourinary: Negative for dysuria. Musculoskeletal: Negative for back pain. Skin: Negative for rash. Neurological: Negative for headaches, focal weakness or numbness. All other ROS negative ____________________________________________   PHYSICAL EXAM:  VITAL SIGNS: Blood pressure 127/90, pulse (!) 108, temperature 98.1 F (36.7 C),  temperature source Oral, resp. rate 18, height 5\' 9"  (1.753 m), SpO2 94 %.  Constitutional: Alert and oriented.  Appears intoxicated Eyes: Conjunctivae are normal. No swelling around eyes Head: Atraumatic. Nose: No congestion/rhinnorhea. Mouth/Throat: Mucous membranes are moist.   Neck: No stridor. Trachea Midline. FROM Cardiovascular: tachycardia, no swelling noted Respiratory: No increased wob, no stridor Gastrointestinal: Epigastric tenderness no distention. No abdominal bruits.  Musculoskeletal: No lower extremity tenderness nor edema.  No joint effusions. Neurologic:  Normal speech and language. No gross focal neurologic deficits are appreciated.  Skin:  Skin is warm, dry and intact. No rash noted. Psychiatric:  GU: Deferred   ____________________________________________   LABS (all labs ordered are listed, but only abnormal results are displayed)  Labs Reviewed  CBC WITH DIFFERENTIAL/PLATELET - Abnormal; Notable for the following components:      Result Value   WBC 13.8 (*)    Neutro Abs 8.6 (*)    Lymphs Abs 4.1 (*)    All other components within normal limits  COMPREHENSIVE METABOLIC PANEL - Abnormal; Notable for the following components:   Sodium 132 (*)    Chloride 97 (*)    CO2 20 (*)    Glucose, Bld 170 (*)    Calcium 8.5 (*)    AST 50 (*)    ALT 53 (*)    All other components within normal limits  ETHANOL - Abnormal; Notable for the following components:   Alcohol, Ethyl (B) 341 (*)    All other components within normal limits  SALICYLATE LEVEL - Abnormal; Notable for the following components:   Salicylate Lvl <7.0 (*)    All other components within normal limits  ACETAMINOPHEN LEVEL - Abnormal; Notable for the following components:   Acetaminophen (Tylenol), Serum <10 (*)    All other components within normal limits  URINALYSIS, COMPLETE (UACMP) WITH MICROSCOPIC - Abnormal; Notable for the following components:   Color, Urine STRAW (*)    APPearance  CLEAR (*)    Specific Gravity, Urine 1.003 (*)    Hgb urine dipstick SMALL (*)    All other components within normal limits  RESP PANEL BY RT-PCR (FLU A&B, COVID) ARPGX2  CK  URINE DRUG SCREEN, QUALITATIVE ([facility] ONLY)  TSH  T4, FREE  LIPASE, BLOOD  TROPONIN I (HIGH SENSITIVITY)   ____________________________________________   ED ECG REPORT I, , the attending physician, personally viewed and interpreted this ECG. Sinus tachycardia rate of 110, no ST elevation, no T wave versions, normal intervals  ____________________________________________  RADIOLOGY Pending  Official radiology report(s): CT Head Wo Contrast  Result Date: 10/30/2020 CLINICAL DATA:  Altered mental status EXAM: CT HEAD WITHOUT CONTRAST TECHNIQUE: Contiguous axial images were obtained from the base of the skull through the vertex without intravenous contrast. COMPARISON:  None. FINDINGS: Brain: No acute intracranial abnormality. Specifically, no hemorrhage, hydrocephalus, mass lesion, acute infarction, or significant intracranial injury. Vascular: No hyperdense vessel or unexpected  calcification. Skull: No acute calvarial abnormality. Sinuses/Orbits: Visualized paranasal sinuses and mastoids clear. Orbital soft tissues unremarkable. Other: None IMPRESSION: No acute intracranial abnormality. Electronically Signed   By: Charlett Nose M.D.   On: 10/30/2020 23:23    ____________________________________________   PROCEDURES  Procedure(s) performed (including Critical Care):  .1-3 Lead EKG Interpretation Performed by: Concha Se, MD Authorized by: Concha Se, MD     Interpretation: abnormal     ECG rate:  110s    ECG rate assessment: tachycardic     Rhythm: sinus tachycardia     Ectopy: none     Conduction: normal       ____________________________________________   INITIAL IMPRESSION / ASSESSMENT AND PLAN / ED COURSE  Lee Sparks was evaluated in Emergency Department on 10/30/2020  for the symptoms described in the history of present illness. He was evaluated in the context of the global COVID-19 pandemic, which necessitated consideration that the patient might be at risk for infection with the SARS-CoV-2 virus that causes COVID-19. Institutional protocols and algorithms that pertain to the evaluation of patients at risk for COVID-19 are in a state of rapid change based on information released by regulatory bodies including the CDC and federal and state organizations. These policies and algorithms were followed during the patient's care in the ED.    Pt with chest pain.  Will get cardiac markers and EKG keep on the cardiac monitor to evaluate for ACS.  Given patient having pain with breathing as well as tachycardia and hypoxia to 94% will get CT PE to evaluate for pneumonia, pulmonary embolism.  Patient also reporting pain in his abdomen so we will get CT abdomen to rule out acute abdominal pathology such as pancreatitis, cholecystitis, small bowel obstruction.  In case patient needs to be anticoagulated given he is intoxicated will get CT head to make sure no evidence of intracranial hemorrhage  We will IVC patient due to alcohol intoxication with SI  Patient handoff to oncoming team pending repeat troponin, CT scans and reassessment for medical clearance  The patient has been placed in psychiatric observation due to the need to provide a safe environment for the patient while obtaining psychiatric consultation and evaluation, as well as ongoing medical and medication management to treat the patient's condition.  The patient has been placed under full IVC at this time.        ____________________________________________   FINAL CLINICAL IMPRESSION(S) / ED DIAGNOSES   Final diagnoses:  Chest pain, unspecified type  ETOH abuse      MEDICATIONS GIVEN DURING THIS VISIT:  Medications  sodium chloride 0.9 % bolus 1,000 mL (0 mLs Intravenous Stopped 10/30/20 2242)   thiamine (B-1) injection 100 mg (100 mg Intravenous Given 10/30/20 2230)  folic acid (FOLVITE) tablet 1 mg (1 mg Oral Given 10/30/20 2234)  ondansetron (ZOFRAN) injection 4 mg (4 mg Intravenous Given 10/30/20 2232)  pantoprazole (PROTONIX) injection 40 mg (40 mg Intravenous Given 10/30/20 2232)  acetaminophen (TYLENOL) tablet 1,000 mg (1,000 mg Oral Given 10/30/20 2234)  iohexol (OMNIPAQUE) 350 MG/ML injection 100 mL (100 mLs Intravenous Contrast Given 10/30/20 2313)     ED Discharge Orders    None       Note:  This document was prepared using Dragon voice recognition software and may include unintentional dictation errors.   Concha Se, MD 10/30/20 [phone]

## 2020-10-30 NOTE — ED Notes (Signed)
Pt belongings included: jeans, belt, black tennis shoes, black t-shirt, blue boxers, a black cell phone, black wallet with $5 cash inside.   Pt belonging bag 1/1 labeled with pt label at this time by EDT Vernona Rieger.

## 2020-10-30 NOTE — ED Notes (Signed)
Pt dressed out into purple ED scrubs by EDTs Vernona Rieger and Misty Stanley at this time. Pt belongings placed in pt belonging bag labeled with pt sticker on it at this time.

## 2020-10-30 NOTE — ED Triage Notes (Signed)
Pt arrived via [facility] from home with c/o chest pain. Per EMS, [facility] was called first for c/o SI and called EMS on scene when pt c/o chest pain.   Per EMS, pt given 3 nitro sprays and 324mg  aspirin.   Per EMS, pt is intoxicated and has had "an 18 pack and 2 40s at least in the last 2 days"  Pt currently alert and responsive to voice. Answers some questions appropriately at this time.

## 2020-10-30 NOTE — ED Notes (Signed)
IVC/Consult ordered ?

## 2020-10-30 NOTE — ED Notes (Signed)
Pt IV tubing changed from EMS tubing to ED tubing for CT scan at this time.

## 2020-10-30 NOTE — ED Notes (Signed)
Pt given urinal at this time 

## 2020-10-31 DIAGNOSIS — F332 Major depressive disorder, recurrent severe without psychotic features: Secondary | ICD-10-CM

## 2020-10-31 DIAGNOSIS — F1020 Alcohol dependence, uncomplicated: Secondary | ICD-10-CM

## 2020-10-31 DIAGNOSIS — F1010 Alcohol abuse, uncomplicated: Secondary | ICD-10-CM

## 2020-10-31 LAB — TROPONIN I (HIGH SENSITIVITY): Troponin I (High Sensitivity): 8 ng/L (ref <18)

## 2020-10-31 MED ORDER — DROPERIDOL 2.5 MG/ML IJ SOLN: 2.5000 mg | Freq: Once | INTRAMUSCULAR | Status: DC

## 2020-10-31 MED ORDER — LORAZEPAM 2 MG PO TABS: 0.0000 mg | ORAL_TABLET | Freq: Two times a day (BID) | ORAL | Status: DC

## 2020-10-31 MED ORDER — CHLORDIAZEPOXIDE HCL 25 MG PO CAPS
50.0000 mg | ORAL_CAPSULE | Freq: Three times a day (TID) | ORAL | Status: DC
  Administered 2020-10-31 – 2020-11-01 (×4): 50 mg via ORAL
  Filled 2020-10-31 (×4): qty 2

## 2020-10-31 MED ORDER — THIAMINE HCL 100 MG/ML IJ SOLN
100.0000 mg | Freq: Every day | INTRAMUSCULAR | Status: DC
  Filled 2020-10-31: qty 1

## 2020-10-31 MED ORDER — LORAZEPAM 2 MG/ML IJ SOLN
2.0000 mg | Freq: Once | INTRAMUSCULAR | Status: AC
  Administered 2020-10-31: 2 mg via INTRAVENOUS
  Filled 2020-10-31: qty 1

## 2020-10-31 MED ORDER — LORAZEPAM 2 MG/ML IJ SOLN: 0.0000 mg | Freq: Four times a day (QID) | INTRAMUSCULAR | Status: DC

## 2020-10-31 MED ORDER — ALUM & MAG HYDROXIDE-SIMETH 200-200-20 MG/5ML PO SUSP
30.0000 mL | Freq: Once | ORAL | Status: AC
  Administered 2020-10-31: 30 mL via ORAL
  Filled 2020-10-31: qty 30

## 2020-10-31 MED ORDER — THIAMINE HCL 100 MG PO TABS
100.0000 mg | ORAL_TABLET | Freq: Every day | ORAL | Status: DC
  Administered 2020-10-31 – 2020-11-01 (×2): 100 mg via ORAL
  Filled 2020-10-31 (×2): qty 1

## 2020-10-31 MED ORDER — LORAZEPAM 2 MG/ML IJ SOLN: 0.0000 mg | Freq: Two times a day (BID) | INTRAMUSCULAR | Status: DC

## 2020-10-31 MED ORDER — LORAZEPAM 2 MG PO TABS
0.0000 mg | ORAL_TABLET | Freq: Four times a day (QID) | ORAL | Status: DC
  Administered 2020-10-31 – 2020-11-01 (×4): 2 mg via ORAL
  Filled 2020-10-31 (×4): qty 1

## 2020-10-31 MED ORDER — LIDOCAINE VISCOUS HCL 2 % MT SOLN
15.0000 mL | Freq: Once | OROMUCOSAL | Status: AC
  Administered 2020-10-31: 15 mL via ORAL
  Filled 2020-10-31: qty 15

## 2020-10-31 NOTE — BH Assessment (Signed)
Patient is to be admitted to [facility] after 8:00pm by Dr. Neale Burly.  Attending Physician will be Dr. Toni Amend.   Patient has been assigned a room, by [facility] Charge Nurse Gigi.     ER staff is aware of the admission:  Luann, ER Secretary    Dr. Scotty Court, ER MD   Connye Burkitt, Patient's Nurse   Ethelene Browns Patient Access.

## 2020-10-31 NOTE — BH Assessment (Signed)
Comprehensive Clinical Assessment (CCA) Note  10/31/2020 Lee Sparks [id number]  Chief Complaint: Patient is a 50 year old male presenting to [facility] ED under IVC. Per triage note Pt arrived via [facility] from home with c/o chest pain. Per EMS, [facility] was called first for c/o SI and called EMS on scene when pt c/o chest pain. Per EMS, pt given 3 nitro sprays and 324mg  aspirin. Per EMS, pt is intoxicated and has had "an 18 pack and 2 40s at least in the last 2 days." During assessment patient appears alert and oriented x4, calm and cooperative, mood appears depressed. Patient reports why he is presenting to the ED "chest pain, very nauseous, and I've been drinking heavy lately, I was trying to drink myself to death." When asked why patient wants to drink himself to death he reports "because I got two brothers that don't give a shit about me, my parents are dead, and I have nobody." Patient denies any attempts to hurt himself in the past and denies any current therapy. Patient does report that he currently lives in a hotel and reports that he is employed but believes he might have lost his job "I don't know if I have that job or not anymore." Patient reports a lack of sleep and a fair appetite. Patient reports drinking "15 beers a day" recently over the last couple of days. Patient BAL is 341. Patient continues to report SI, denies HI/AH/VH and does not appear to be responding to any internal or external stimuli.  Disposition pending  Chief Complaint  Patient presents with  . Chest Pain  . Addiction Problem  . Depression   Visit Diagnosis: Alcohol Use Disorder severe, Substance-Induced Mood Disorder   CCA Screening, Triage and Referral (STR)  Patient Reported Information How did you hear about ? Legal System  Referral name: No data recorded Referral phone number: No data recorded  Whom do you see for routine medical problems? I don't have a doctor  Practice/Facility Name: No data  recorded Practice/Facility Phone Number: No data recorded Name of Contact: No data recorded Contact Number: No data recorded Contact Fax Number: No data recorded Prescriber Name: No data recorded Prescriber Address (if known): No data recorded  What Is the Reason for Your Visit/Call Today? Detox  How Long Has This Been Causing You Problems? > than 6 months  What Do You Feel Would Help You the Most Today? Alcohol or Drug Use Treatment; Treatment for Depression or other mood problem   Have You Recently Been in Any Inpatient Treatment (Hospital/Detox/Crisis Center/28-Day Program)? No  Name/Location of Program/Hospital:No data recorded How Long Were You There? No data recorded When Were You Discharged? No data recorded  Have You Ever Received Services From [facility] Before? Yes  Who Do You See at [facility]? ED treatment   Have You Recently Had Any Thoughts About Hurting Yourself? Yes  Are You Planning to Commit Suicide/Harm Yourself At This time? Yes   Have you Recently Had Thoughts About Hurting Someone [facility]? No  Explanation: No data recorded  Have You Used Any Alcohol or Drugs in the Past 24 Hours? Yes  How Long Ago Did You Use Drugs or Alcohol? No data recorded What Did You Use and How Much? Alcohol "15 beers a day"   Do You Currently Have a Therapist/Psychiatrist? No  Name of Therapist/Psychiatrist: No data recorded  Have You Been Recently Discharged From Any Office Practice or Programs? No  Explanation of Discharge From Practice/Program: No data  recorded    CCA Screening Triage Referral Assessment Type of Contact: Face-to-Face  Is this Initial or Reassessment? No data recorded Date Telepsych consult ordered in [facility]:  No data recorded Time Telepsych consult ordered in [facility]:  No data recorded  Patient Reported Information Reviewed? Yes  Patient Left Without Being Seen? No data recorded Reason for Not Completing Assessment: No data recorded  Collateral  Involvement: None provided   Does Patient Have a Court Appointed Legal Guardian? No data recorded Name and Contact of Legal Guardian: No data recorded If Minor and Not Living with Parent(s), Who has Custody? No data recorded Is CPS involved or ever been involved? Never  Is APS involved or ever been involved? Never   Patient Determined To Be At Risk for Harm To Self or Others Based on Review of Patient Reported Information or Presenting Complaint? Yes, for Self-Harm  Method: No data recorded Availability of Means: No data recorded Intent: No data recorded Notification Required: No data recorded Additional Information for Danger to Others Potential: No data recorded Additional Comments for Danger to Others Potential: No data recorded Are There Guns or Other Weapons in Your Home? No data recorded Types of Guns/Weapons: No data recorded Are These Weapons Safely Secured?                            No data recorded Who Could Verify You Are Able To Have These Secured: No data recorded Do You Have any Outstanding Charges, Pending Court Dates, Parole/Probation? No data recorded Contacted To Inform of Risk of Harm To Self or Others: No data recorded  [facility] ED   Does Patient Present under Involuntary Commitment? Yes  IVC Papers Initial File Date: 10/31/2020   [place]   Patient Currently Receiving the Following Services: Not Receiving Services   Determination of Need: Emergent (2 hours)   Options For Referral: Outpatient Therapy     CCA Biopsychosocial Intake/Chief Complaint:  Patient is presenting under IVC due to SI and alcohol intoxication  Current Symptoms/Problems: Patient is presenting under IVC due to SI and alcohol intoxication   Patient Reported Schizophrenia/Schizoaffective Diagnosis in Past: No   Strengths: Patient is able to communicate his needs  Preferences: Unknown  Abilities: Patient is able to communicate his  needs   Type of Services Patient Feels are Needed: Unknown   Initial Clinical Notes/Concerns: NOne   Mental Health Symptoms Depression:  Change in energy/activity; Hopelessness; Sleep (too much or little); Worthlessness   Duration of Depressive symptoms: Greater than two weeks   Mania:  None   Anxiety:   None   Psychosis:  None   Duration of Psychotic symptoms: No data recorded  Trauma:  None   Obsessions:  None   Compulsions:  None   Inattention:  None   Hyperactivity/Impulsivity:  N/A   Oppositional/Defiant Behaviors:  None   Emotional Irregularity:  None   Other Mood/Personality Symptoms:  No data recorded   Mental Status Exam Appearance and self-care  Stature:  Average   Weight:  Average weight   Clothing:  Disheveled   Grooming:  Neglected   Cosmetic use:  None   Posture/gait:  Normal   Motor activity:  Not Remarkable   Sensorium  Attention:  Normal   Concentration:  Normal   Orientation:  X5   Recall/memory:  Normal   Affect and Mood  Affect:  Depressed   Mood:  Depressed  Relating  Eye contact:  Normal   Facial expression:  Depressed   Attitude toward examiner:  Cooperative   Thought and Language  Speech flow: Clear and Coherent   Thought content:  Appropriate to Mood and Circumstances   Preoccupation:  None   Hallucinations:  None   Organization:  No data recorded  [employer] of Knowledge:  Fair   Intelligence:  Average   Abstraction:  Normal   Judgement:  Impaired   Reality Testing:  Realistic   Insight:  Fair   Decision Making:  Impulsive   Social Functioning  Social Maturity:  Isolates   Social Judgement:  Normal   Stress  Stressors:  Family conflict; Financial; Housing   Coping Ability:  Exhausted   Skill Deficits:  None   Supports:  Support needed     Religion: Religion/Spirituality Are You A Religious Person?: No  Leisure/Recreation: Leisure / Recreation Do You Have  Hobbies?: No  Exercise/Diet: Exercise/Diet Do You Exercise?: No Have You Gained or Lost A Significant Amount of Weight in the Past Six Months?: No Do You Follow a Special Diet?: No Do You Have Any Trouble Sleeping?: Yes Explanation of Sleeping Difficulties: Patient reports difficulty sleeping   CCA Employment/Education Employment/Work Situation: Employment / Work Situation Employment situation: Unemployed (Patient reports employed but could have potentially lost his job) 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) Describe how patient's job has been impacted: Patient reports alcohol use is affecting his job What is the longest time patient has a held a job?: Unknown Where was the patient employed at that time?: Unknown Has patient ever been in the [employer]?: No  Education: Education Is Patient Currently Attending School?: No Did Garment/textile technologist From [employer]?:  (Unknown)   CCA Family/Childhood History Family and Relationship History: Family history Marital status: Single Are you sexually active?:  (Unknown) What is your sexual orientation?: Unknown Has your sexual activity been affected by drugs, alcohol, medication, or emotional stress?: Unknown Does patient have children?:  (Unknown)  Childhood History:  Childhood History By whom was/is the patient raised?: Both parents Additional childhood history information: None reported Description of patient's relationship with caregiver when they were a child: None reported Patient's description of current relationship with people who raised him/her: None reported How were you disciplined when you got in trouble as a child/adolescent?: None reported Does patient have siblings?: Yes Number of Siblings: 2 Description of patient's current relationship with siblings: Patient reports relationship with brothers is strained Did patient suffer any verbal/emotional/physical/sexual abuse as a  child?: No Did patient suffer from severe childhood neglect?: No Has patient ever been sexually abused/assaulted/raped as an adolescent or adult?: No Was the patient ever a victim of a crime or a disaster?: No Witnessed domestic violence?: No Has patient been affected by domestic violence as an adult?: No  Child/Adolescent Assessment:     CCA Substance Use Alcohol/Drug Use: Alcohol / Drug Use Pain Medications: See MAR Prescriptions: See MAR Over the Counter: See MAR History of alcohol / drug use?: Yes Substance #1 Name of Substance 1: Alcohol 1 - Amount (size/oz): "15 beers" 1 - Frequency: daily 1 - Last Use / Amount: 10/30/20 1- Route of Use: Oral                       ASAM's:  Six Dimensions of Multidimensional Assessment  Dimension 1:  Acute Intoxication and/or Withdrawal Potential:  Dimension 2:  Biomedical Conditions and Complications:      Dimension 3:  Emotional, Behavioral, or Cognitive Conditions and Complications:     Dimension 4:  Readiness to Change:     Dimension 5:  Relapse, Continued use, or Continued Problem Potential:     Dimension 6:  Recovery/Living Environment:     ASAM Severity Score:    ASAM Recommended Level of Treatment:     Substance use Disorder (SUD) Substance Use Disorder (SUD)  Checklist Symptoms of Substance Use: Continued use despite having a persistent/recurrent physical/psychological problem caused/exacerbated by use,Evidence of tolerance,Presence of craving or strong urge to use,Social, occupational, recreational activities given up or reduced due to use,Evidence of withdrawal (Comment),Large amounts of time spent to obtain, use or recover from the substance(s),Recurrent use that results in a failure to fulfill major role obligations (work, school, home),Substance(s) often taken in larger amounts or over longer times than was intended,Continued use despite persistent or recurrent social, interpersonal problems, caused or  exacerbated by use,Persistent desire or unsuccessful efforts to cut down or control use,Repeated use in physically hazardous situations  Recommendations for Services/Supports/Treatments:   Disposition pending   DSM5 Diagnoses: Patient Active Problem List   Diagnosis Date Noted  . Pancreatitis 05/03/2017  . Acute pancreatitis 05/01/2017  . Thrombocytopenia (HCC) 07/29/2011  . Cocaine abuse (HCC) 07/28/2011  . Upper GI bleeding 07/27/2011  . Tachycardia 07/27/2011    Class: Acute  . Hypertension 07/27/2011    Class: Acute  . Alcohol withdrawal syndrome (HCC) 07/27/2011    Class: Acute  . Abdominal pain 07/27/2011    Class: Acute    Patient Centered Plan: Patient is on the following Treatment Plan(s):  Depression and Substance Abuse   Referrals to Alternative Service(s): Referred to Alternative Service(s):   Place:   Date:   Time:    Referred to Alternative Service(s):   Place:   Date:   Time:    Referred to Alternative Service(s):   Place:   Date:   Time:    Referred to Alternative Service(s):   Place:   Date:   Time:     Averi Cacioppo A Mercy Leppla, LCAS-A

## 2020-10-31 NOTE — ED Provider Notes (Signed)
Emergency Medicine Observation Re-evaluation Note  KIPP SHANK is a 50 y.o. male, seen on rounds today.  Pt initially presented to the ED for complaints of Chest Pain, Addiction Problem, and Depression  Currently, the patient is calm, no acute complaints.  Physical Exam  Blood pressure (!) 147/101, pulse (!) 110, temperature 98.1 F (36.7 C), temperature source Oral, resp. rate 18, height 5\' 9"  (1.753 m), SpO2 96 %. Physical Exam General: NAD Lungs: CTAB Psych: not agitated  ED Course / MDM  EKG:    I have reviewed the labs performed to date as well as medications administered while in observation.  Recent changes in the last 24 hours include no acute events overnight.  Patient developed some mild alcohol withdrawal symptoms overnight, started on CIWA protocol.  Reevaluated by psychiatry today who notes the patient is still suicidal and recommends admission.    Patient is otherwise medically stable with mild alcohol withdrawal syndrome managed per protocol.  Plan  Current plan is for psychiatric admission. Patient is under full IVC at this time.   , MD 10/31/20 1407

## 2020-10-31 NOTE — ED Notes (Signed)
IVC/ Consult ordered/ Eval in Am morning by psych Md

## 2020-10-31 NOTE — ED Notes (Signed)
Patient is resting comfortably. 

## 2020-10-31 NOTE — ED Notes (Signed)
Patient took pills easily with ginger ale. Patient c/o nausea and "upset stomach." Patient didn't eat 3/4's of lunch tray and hasn't touched dinner tray. MD aware.

## 2020-10-31 NOTE — ED Notes (Signed)
Patient is resting. NAD.

## 2020-10-31 NOTE — ED Notes (Signed)
Oriented to unit, use of security cameras, bathroom, dayroom and personal room. Given fresh drink and emesis bag per request. Pt denies further needs. Contracts for safety.

## 2020-10-31 NOTE — ED Notes (Signed)
Patient will be admitted to BMU on 4/7 after 10am.

## 2020-10-31 NOTE — ED Notes (Signed)
Patient was dozing, but woke easily when this writer came in the room. Patient reports moderate relief of abdominal discomfort with GI cocktail.

## 2020-10-31 NOTE — Consult Note (Signed)
[facility] Face-to-Face Psychiatry Consult   Reason for Consult: Consult for 50 year old man came into the emergency room reporting suicidal ideation and drinking heavily Referring Physician: Scotty Court Patient Identification: Lee Sparks MRN:  [id number] Principal Diagnosis: Severe recurrent major depression without psychotic features [facility]) Diagnosis:  Principal Problem:   Severe recurrent major depression without psychotic features (HCC) Active Problems:   Alcohol withdrawal syndrome (HCC)   Alcohol abuse   Total Time spent with patient: 1 hour  Subjective:   Lee Sparks is a 50 y.o. male patient admitted with "I was trying to drink myself to death".  HPI: Patient seen chart reviewed.  50 year old man came to the hospital initially listed as having a chief complaint of chest pain but apparently had actually called 911 in the first place because of his suicidal ideation.  Patient had a blood alcohol of about 340 when he presented.  He is cooperative and alert to interview.  Says that he has been drinking very heavily at least 15 beers a day for the last couple weeks.  Prior to that he was also drinking but at a lower rate.  He feels like he has a lot of stress that he cannot manage.  Estranged from his family.  Says his daughter will not let him see his grandchild and his brothers do not care about him.  He has been living in the [facility] but is basically homeless.  Had been working at 1 point but lost his job recently.  Not getting any outpatient mental health care.  Not on any medication.  Patient continues to state he is having suicidal thoughts thinking of drinking himself to death feels hopeless and helpless.  He is having visual hallucinations seeing flashing lights typical of alcohol withdrawal.  Not auditory hallucinations.  No homicidal ideation no threats to anyone.  Patient denies any past history of delirium tremens or alcohol withdrawal seizures.  Past Psychiatric History: Has  been to [facility] in the past for detox.  Says he gets very shaky in withdrawal but does not sound like he is ever had a seizure or full DTs.  No history of being admitted to rehab or hospitals for alcohol withdrawal treatment.  No history of any treatment for depression that he can recall.  He does say that when he was much younger around 50 he was sent to [facility] for a couple weeks because his family thought that he was suicidal.  He says that he was not really suicidal at the time but he understands why they thought that.  Evidently has not been getting treatment for depression since then.  Risk to Self:   Risk to Others:   Prior Inpatient Therapy:   Prior Outpatient Therapy:    Past Medical History:  Past Medical History:  Diagnosis Date  . Alcohol-induced pancreatitis   . Anemia   . Bronchitis     Past Surgical History:  Procedure Laterality Date  . CYSTECTOMY    . ESOPHAGOGASTRODUODENOSCOPY  07/28/2011   Procedure: ESOPHAGOGASTRODUODENOSCOPY (EGD);  Surgeon: Freddy Jaksch, MD;  Location[facility] ENDOSCOPY;  Service: Endoscopy;  Laterality: N/A;  . HERNIA REPAIR     Family History:  Family History  Problem Relation Age of Onset  . Hypertension Father   . Aneurysm Father   . Cancer Other   . Hypertension Mother   . Pneumonia Mother    Family Psychiatric  History: None reported Social History:  Social History   Substance and Sexual Activity  Alcohol Use Yes  . Alcohol/week: 50.0 standard drinks  . Types: 50 Cans of beer per week   Comment: Equivalent of two 40 oz beers daily.     Social History   Substance and Sexual Activity  Drug Use No    Social History   Socioeconomic History  . Marital status: Single    Spouse name: Not on file  . Number of children: Not on file  . Years of education: Not on file  . Highest education level: Not on file  Occupational History  . Not on file  Tobacco Use  . Smoking status: Current Every Day Smoker    Packs/day:  0.50    Years: 15.00    Pack years: 7.50    Types: Cigarettes    Last attempt to quit: 06/27/2011    Years since quitting: 9.3  . Smokeless tobacco: Never Used  Substance and Sexual Activity  . Alcohol use: Yes    Alcohol/week: 50.0 standard drinks    Types: 50 Cans of beer per week    Comment: Equivalent of two 40 oz beers daily.  . Drug use: No  . Sexual activity: Yes  Other Topics Concern  . Not on file  Social History Narrative  . Not on file   Social Determinants of Health   Financial Resource Strain: Not on file  Food Insecurity: Not on file  Transportation Needs: Not on file  Physical Activity: Not on file  Stress: Not on file  Social Connections: Not on file   Additional Social History:    Allergies:   Allergies  Allergen Reactions  . Grapefruit Diet Or [Extra Strength Grapefruit] Hives    Pt states he has a reaction to grapefruit.     Labs:  Results for orders placed or performed during the hospital encounter of 10/30/20 (from the past 48 hour(s))  CBC with Differential     Status: Abnormal   Collection Time: 10/30/20 10:00 PM  Result Value Ref Range   WBC 13.8 (H) 4.0 - 10.5 K/uL   RBC 5.47 4.22 - 5.81 MIL/uL   Hemoglobin 16.0 13.0 - 17.0 g/dL   HCT 11.9 14.7 - 82.9 %   MCV 84.3 80.0 - 100.0 fL   MCH 29.3 26.0 - 34.0 pg   MCHC 34.7 30.0 - 36.0 g/dL   RDW 56.2 13.0 - 86.5 %   Platelets 239 150 - 400 K/uL   nRBC 0.0 0.0 - 0.2 %   Neutrophils Relative % 62 %   Neutro Abs 8.6 (H) 1.7 - 7.7 K/uL   Lymphocytes Relative 30 %   Lymphs Abs 4.1 (H) 0.7 - 4.0 K/uL   Monocytes Relative 6 %   Monocytes Absolute 0.9 0.1 - 1.0 K/uL   Eosinophils Relative 1 %   Eosinophils Absolute 0.1 0.0 - 0.5 K/uL   Basophils Relative 1 %   Basophils Absolute 0.1 0.0 - 0.1 K/uL   Immature Granulocytes 0 %   Abs Immature Granulocytes 0.05 0.00 - 0.07 K/uL    Comment: Performed at [facility], [address] [place], [place] [address]  Comprehensive metabolic  panel     Status: Abnormal   Collection Time: 10/30/20 10:00 PM  Result Value Ref Range   Sodium 132 (L) 135 - 145 mmol/L   Potassium 3.7 3.5 - 5.1 mmol/L   Chloride 97 (L) 98 - 111 mmol/L   CO2 20 (L) 22 - 32 mmol/L   Glucose, Bld 170 (H) 70 - 99 mg/dL  Comment: Glucose reference range applies only to samples taken after fasting for at least 8 hours.   BUN 10 6 - 20 mg/dL   Creatinine, Ser 1.61 0.61 - 1.24 mg/dL   Calcium 8.5 (L) 8.9 - 10.3 mg/dL   Total Protein 7.4 6.5 - 8.1 g/dL   Albumin 4.1 3.5 - 5.0 g/dL   AST 50 (H) 15 - 41 U/L   ALT 53 (H) 0 - 44 U/L   Alkaline Phosphatase 51 38 - 126 U/L   Total Bilirubin 0.9 0.3 - 1.2 mg/dL   GFR, Estimated >09 >60 mL/min    Comment: (NOTE) Calculated using the CKD-EPI Creatinine Equation (2021)    Anion gap 15 5 - 15    Comment: Performed at [facility], [address], [place], [place] [address]  Troponin I (High Sensitivity)     Status: None   Collection Time: 10/30/20 10:00 PM  Result Value Ref Range   Troponin I (High Sensitivity) 7 <18 ng/L    Comment: (NOTE) Elevated high sensitivity troponin I (hsTnI) values and significant  changes across serial measurements may suggest ACS but many other  chronic and acute conditions are known to elevate hsTnI results.  Refer to the "Links" section for chest pain algorithms and additional  guidance. Performed at [facility], [address] [place], [place] [address]   CK     Status: None   Collection Time: 10/30/20 10:00 PM  Result Value Ref Range   Total CK 57 49 - 397 U/L    Comment: Performed at [facility], [address] [place], [place] [address]  Ethanol     Status: Abnormal   Collection Time: 10/30/20 10:00 PM  Result Value Ref Range   Alcohol, Ethyl (B) 341 (HH) <10 mg/dL    Comment: CRITICAL RESULT CALLED TO, READ BACK BY AND VERIFIED WITH ISABELLA [email or web address]  10/30/20 RH (NOTE) Lowest detectable limit for serum alcohol is 10  mg/dL.  For medical purposes only. Performed at [facility], [address] [place], [place] [address]   Salicylate level     Status: Abnormal   Collection Time: 10/30/20 10:00 PM  Result Value Ref Range   Salicylate Lvl <7.0 (L) 7.0 - 30.0 mg/dL    Comment: Performed at [facility], [address] [place], [place] [address]  Acetaminophen level     Status: Abnormal   Collection Time: 10/30/20 10:00 PM  Result Value Ref Range   Acetaminophen (Tylenol), Serum <10 (L) 10 - 30 ug/mL    Comment: (NOTE) Therapeutic concentrations vary significantly. A range of 10-30 ug/mL  may be an effective concentration for many patients. However, some  are best treated at concentrations outside of this range. Acetaminophen concentrations >150 ug/mL at 4 hours after ingestion  and >50 ug/mL at 12 hours after ingestion are often associated with  toxic reactions.  Performed at [facility], [address] [place], [place] [address]   TSH     Status: None   Collection Time: 10/30/20 10:00 PM  Result Value Ref Range   TSH 1.645 0.350 - 4.500 uIU/mL    Comment: Performed by a 3rd Generation assay with a functional sensitivity of <=0.01 uIU/mL. Performed at [facility], [address] [place], [place] [address]   T4, free     Status: None   Collection Time: 10/30/20 10:00 PM  Result Value Ref Range   Free T4 0.82 0.61 - 1.12 ng/dL    Comment: (  NOTE) Biotin ingestion may interfere with free T4 tests. If the results are inconsistent with the TSH level, previous test results, or the clinical presentation, then consider biotin interference. If needed, order repeat testing after stopping biotin. Performed at [facility], [address] [place], [place] [address]   Lipase, blood     Status: None   Collection Time: 10/30/20 10:00 PM  Result Value Ref Range   Lipase 45 11 - 51 U/L    Comment: Performed at [facility], [address] [place], [place] [address]  Urinalysis, Complete w Microscopic     Status: Abnormal   Collection Time: 10/30/20 10:07 PM  Result Value Ref Range   Color, Urine STRAW (A) YELLOW   APPearance CLEAR (A) CLEAR   Specific Gravity, Urine 1.003 (L) 1.005 - 1.030   pH 6.0 5.0 - 8.0   Glucose, UA NEGATIVE NEGATIVE mg/dL   Hgb urine dipstick SMALL (A) NEGATIVE   Bilirubin Urine NEGATIVE NEGATIVE   Ketones, ur NEGATIVE NEGATIVE mg/dL   Protein, ur NEGATIVE NEGATIVE mg/dL   Nitrite NEGATIVE NEGATIVE   Leukocytes,Ua NEGATIVE NEGATIVE   WBC, UA 0-5 0 - 5 WBC/hpf   Bacteria, UA NONE SEEN NONE SEEN   Squamous Epithelial / LPF NONE SEEN 0 - 5    Comment: Performed at [facility], [address] [place], [place] [address]  Urine Drug Screen, Qualitative ([facility] only)     Status: None   Collection Time: 10/30/20 10:07 PM  Result Value Ref Range   Tricyclic, Ur Screen NONE DETECTED NONE DETECTED   Amphetamines, Ur Screen NONE DETECTED NONE DETECTED   MDMA (Ecstasy)Ur Screen NONE DETECTED NONE DETECTED   Cocaine Metabolite,Ur [place] NONE DETECTED NONE DETECTED   Opiate, Ur Screen NONE DETECTED NONE DETECTED   Phencyclidine (PCP) Ur S NONE DETECTED NONE DETECTED   Cannabinoid 50 Ng, Ur  NONE DETECTED NONE DETECTED   Barbiturates, Ur Screen NONE DETECTED NONE DETECTED   Benzodiazepine, Ur Scrn NONE DETECTED NONE DETECTED   Methadone Scn, Ur NONE DETECTED NONE DETECTED    Comment: (NOTE) Tricyclics + metabolites, urine    Cutoff 1000 ng/mL Amphetamines + metabolites, urine  Cutoff 1000 ng/mL MDMA (Ecstasy), urine              Cutoff 500 ng/mL Cocaine Metabolite, urine          Cutoff 300 ng/mL Opiate + metabolites, urine        Cutoff 300 ng/mL Phencyclidine (PCP), urine         Cutoff 25 ng/mL Cannabinoid, urine                 Cutoff 50 ng/mL Barbiturates + metabolites, urine  Cutoff 200 ng/mL Benzodiazepine, urine              Cutoff 200 ng/mL Methadone, urine                    Cutoff 300 ng/mL  The urine drug screen provides only a preliminary, unconfirmed analytical test result and should not be used for non-medical purposes. Clinical consideration and professional judgment should be applied to any positive drug screen result due to possible interfering substances. A more specific alternate chemical method must be used in order to obtain a confirmed analytical result. Gas chromatography / mass spectrometry (GC/MS) is the preferred confirm atory method. Performed at [facility], [address] [place], [place] [address]   Resp Panel by RT-PCR (Flu  A&B, Covid) Nasopharyngeal Swab     Status: None   Collection Time: 10/30/20 10:08 PM   Specimen: Nasopharyngeal Swab; Nasopharyngeal(NP) swabs in vial transport medium  Result Value Ref Range   SARS Coronavirus 2 by RT PCR NEGATIVE NEGATIVE    Comment: (NOTE) SARS-CoV-2 target nucleic acids are NOT DETECTED.  The SARS-CoV-2 RNA is generally detectable in upper respiratory specimens during the acute phase of infection. The lowest concentration of SARS-CoV-2 viral copies this assay can detect is 138 copies/mL. A negative result does not preclude SARS-Cov-2 infection and should not be used as the sole basis for treatment or other patient management decisions. A negative result may occur with  improper specimen collection/handling, submission of specimen other than nasopharyngeal swab, presence of viral mutation(s) within the areas targeted by this assay, and inadequate number of viral copies(<138 copies/mL). A negative result must be combined with clinical observations, patient history, and epidemiological information. The expected result is Negative.  Fact Sheet for Patients:  BloggerCourse.com  Fact Sheet for Healthcare Providers:  SeriousBroker.it  This test is no t yet approved or cleared by the [place] FDA and  has been authorized for  detection and/or diagnosis of SARS-CoV-2 by FDA under an Emergency Use Authorization (EUA). This EUA will remain  in effect (meaning this test can be used) for the duration of the COVID-19 declaration under Section 564(b)(1) of the Act, 21 U.S.C.section 360bbb-3(b)(1), unless the authorization is terminated  or revoked sooner.       Influenza A by PCR NEGATIVE NEGATIVE   Influenza B by PCR NEGATIVE NEGATIVE    Comment: (NOTE) The Xpert Xpress SARS-CoV-2/FLU/RSV plus assay is intended as an aid in the diagnosis of influenza from Nasopharyngeal swab specimens and should not be used as a sole basis for treatment. Nasal washings and aspirates are unacceptable for Xpert Xpress SARS-CoV-2/FLU/RSV testing.  Fact Sheet for Patients: BloggerCourse.com  Fact Sheet for Healthcare Providers: SeriousBroker.it  This test is not yet approved or cleared by the [place] FDA and has been authorized for detection and/or diagnosis of SARS-CoV-2 by FDA under an Emergency Use Authorization (EUA). This EUA will remain in effect (meaning this test can be used) for the duration of the COVID-19 declaration under Section 564(b)(1) of the Act, 21 U.S.C. section 360bbb-3(b)(1), unless the authorization is terminated or revoked.  Performed at [facility], [address] [place], [place] [address]   Troponin I (High Sensitivity)     Status: None   Collection Time: 10/31/20 12:30 AM  Result Value Ref Range   Troponin I (High Sensitivity) 8 <18 ng/L    Comment: (NOTE) Elevated high sensitivity troponin I (hsTnI) values and significant  changes across serial measurements may suggest ACS but many other  chronic and acute conditions are known to elevate hsTnI results.  Refer to the "Links" section for chest pain algorithms and additional  guidance. Performed at [facility], [address] [place], [place] [address]      Current Facility-Administered Medications  Medication Dose Route Frequency Provider Last Rate Last Admin  . chlordiazePOXIDE (LIBRIUM) capsule 50 mg  50 mg Oral TID Nita Sickle, MD   50 mg at 10/31/20 0948  . LORazepam (ATIVAN) injection 0-4 mg  0-4 mg Intravenous Q6H Sharman Cheek, MD       Or  . LORazepam (ATIVAN) tablet 0-4 mg  0-4 mg Oral Q6H Sharman Cheek, MD      . Melene Muller ON 11/02/2020] LORazepam (ATIVAN) injection 0-4 mg  0-4 mg Intravenous Delena Serve, MD       Or  . Melene Muller ON 11/02/2020] LORazepam (ATIVAN) tablet 0-4 mg  0-4 mg Oral Q12H Sharman Cheek, MD      . thiamine tablet 100 mg  100 mg Oral Daily Sharman Cheek, MD   100 mg at 10/31/20 6967   Or  . thiamine (B-1) injection 100 mg  100 mg Intravenous Daily Sharman Cheek, MD       Current Outpatient Medications  Medication Sig Dispense Refill  . acetaminophen (TYLENOL) 500 MG tablet Take 2 tablets (1,000 mg total) by mouth every 12 (twelve) hours as needed for moderate pain. 30 tablet 0  . albuterol (PROVENTIL HFA;VENTOLIN HFA) 108 (90 Base) MCG/ACT inhaler Inhale 2 puffs into the lungs every 4 (four) hours as needed for wheezing or shortness of breath. (Patient not taking: Reported on 04/23/2020) 1 Inhaler 1  . cetirizine (ZYRTEC) 5 MG tablet Take 1 tablet (5 mg total) by mouth daily. 30 tablet 0  . lidocaine (LIDODERM) 5 % Place 1 patch onto the skin every 12 (twelve) hours. Remove & Discard patch within 12 hours or as directed by MD 10 patch 0  . naproxen (NAPROSYN) 500 MG tablet Take 1 tablet (500 mg total) by mouth 2 (two) times daily with a meal. 20 tablet 00    Musculoskeletal: Strength & Muscle Tone: within normal limits Gait & Station: normal Patient leans: N/A            Psychiatric Specialty Exam:  Presentation  General Appearance: No data recorded Eye Contact:No data recorded Speech:No data recorded Speech Volume:No data recorded Handedness:No data  recorded  Mood and Affect  Mood:No data recorded Affect:No data recorded  Thought Process  Thought Processes:No data recorded Descriptions of Associations:No data recorded Orientation:No data recorded Thought Content:No data recorded History of Schizophrenia/Schizoaffective disorder:No  Duration of Psychotic Symptoms:No data recorded Hallucinations:No data recorded Ideas of Reference:No data recorded Suicidal Thoughts:No data recorded Homicidal Thoughts:No data recorded  Sensorium  Memory:No data recorded Judgment:No data recorded Insight:No data recorded  Executive Functions  Concentration:No data recorded Attention Span:No data recorded Recall:No data recorded Fund of Knowledge:No data recorded Language:No data recorded  Psychomotor Activity  Psychomotor Activity:No data recorded  Assets  Assets:No data recorded  Sleep  Sleep:No data recorded  Physical Exam: Physical Exam Vitals and nursing note reviewed.  Constitutional:      Appearance: Normal appearance.  HENT:     Head: Normocephalic and atraumatic.     Mouth/Throat:     Pharynx: Oropharynx is clear.  Eyes:     Pupils: Pupils are equal, round, and reactive to light.  Cardiovascular:     Rate and Rhythm: Normal rate and regular rhythm.  Pulmonary:     Effort: Pulmonary effort is normal.     Breath sounds: Normal breath sounds.  Abdominal:     General: Abdomen is flat.     Palpations: Abdomen is soft.  Musculoskeletal:        General: Normal range of motion.  Skin:    General: Skin is warm and dry.  Neurological:     General: No focal deficit present.     Mental Status: He is alert. Mental status is at baseline.  Psychiatric:        Attention and Perception: Attention normal.        Mood and Affect: Mood is anxious and depressed.        Speech: Speech normal.  Behavior: Behavior is slowed.        Thought Content: Thought content includes suicidal ideation. Thought content includes  suicidal plan.        Cognition and Memory: Memory is impaired.        Judgment: Judgment normal.    Review of Systems  Constitutional: Positive for diaphoresis and malaise/fatigue.  HENT: Negative.   Eyes: Negative.   Respiratory: Negative.   Cardiovascular: Negative.   Gastrointestinal: Negative.   Musculoskeletal: Negative.   Skin: Negative.   Neurological: Negative.   Psychiatric/Behavioral: Positive for depression, hallucinations, memory loss, substance abuse and suicidal ideas. The patient is nervous/anxious and has insomnia.    Blood pressure (!) 147/101, pulse (!) 110, temperature 98.1 F (36.7 C), temperature source Oral, resp. rate 18, height  (1.753 m), SpO2 96 %. Body mass index is 26.58 kg/m.  Treatment Plan Summary: Daily contact with patient to assess and evaluate symptoms and progress in treatment, Medication management and Plan 50 year old man intoxicated having alcohol withdrawal symptoms blood pressure tachycardic tremulous diaphoretic.  Not delirious.  Has not had a seizure.  So far cooperative with treatment eating well drinking fluid.  Continues to endorse suicidal thought.  Plan will be for admission to the psychiatric unit.  CIWA protocol to be continued.  Thiamine to be continued.  Treatment team can assess for further depression treatment needs.  Labs reviewed.  Disposition: Recommend psychiatric Inpatient admission when medically cleared. Supportive therapy provided about ongoing stressors.  Mordecai Rasmussen, MD 10/31/2020 11:12 AM

## 2020-10-31 NOTE — ED Notes (Addendum)
Patient ate 1/2 of pizza for dinner and drank 3/4 cup of ginger ale. MD aware.

## 2020-10-31 NOTE — BH Assessment (Signed)
Patient is under review with [facility].  

## 2020-10-31 NOTE — ED Notes (Signed)
Report given to Kendall RN 

## 2020-10-31 NOTE — ED Notes (Signed)
Patient given lunch. Stable. NAD. No needs voiced or noted. Will continue to monitor.  

## 2020-10-31 NOTE — BH Assessment (Signed)
Patient is to be admitted to [facility] after 10:00am on 11/01/20 by Dr. Neale Burly.  Attending Physician will be Dr. Toni Amend.   Patient has been assigned a room, by [facility] Charge Nurse Gigi.     ER staff is aware of the admission:  Luann, ER Secretary    Dr. Scotty Court, ER MD   Connye Burkitt, Patient's Nurse   Ethelene Browns Patient Access.   Error was made in documentation by previous counselor, disregard note from 10/31/20 at 3:40pm

## 2020-11-01 ENCOUNTER — Encounter: Payer: Self-pay | Admitting: Psychiatry

## 2020-11-01 ENCOUNTER — Other Ambulatory Visit: Payer: Self-pay

## 2020-11-01 ENCOUNTER — Inpatient Hospital Stay
Admission: RE | Admit: 2020-11-01 | Discharge: 2020-11-09 | DRG: 885 | Disposition: A | Discharge status: Home / Self Care | Payer: 59 | Source: Intra-hospital | Attending: Behavioral Health | Admitting: Behavioral Health

## 2020-11-01 DIAGNOSIS — I10 Essential (primary) hypertension: Secondary | ICD-10-CM | POA: Diagnosis present

## 2020-11-01 DIAGNOSIS — Y908 Blood alcohol level of 240 mg/100 ml or more: Secondary | ICD-10-CM | POA: Diagnosis present

## 2020-11-01 DIAGNOSIS — F419 Anxiety disorder, unspecified: Secondary | ICD-10-CM | POA: Diagnosis present

## 2020-11-01 DIAGNOSIS — Z6826 Body mass index (BMI) 26.0-26.9, adult: Secondary | ICD-10-CM | POA: Diagnosis not present

## 2020-11-01 DIAGNOSIS — F10239 Alcohol dependence with withdrawal, unspecified: Secondary | ICD-10-CM | POA: Diagnosis present

## 2020-11-01 DIAGNOSIS — R45851 Suicidal ideations: Secondary | ICD-10-CM | POA: Diagnosis present

## 2020-11-01 DIAGNOSIS — F332 Major depressive disorder, recurrent severe without psychotic features: Secondary | ICD-10-CM | POA: Diagnosis present

## 2020-11-01 DIAGNOSIS — G4700 Insomnia, unspecified: Secondary | ICD-10-CM | POA: Diagnosis present

## 2020-11-01 DIAGNOSIS — Z8249 Family history of ischemic heart disease and other diseases of the circulatory system: Secondary | ICD-10-CM

## 2020-11-01 DIAGNOSIS — F17210 Nicotine dependence, cigarettes, uncomplicated: Secondary | ICD-10-CM | POA: Diagnosis present

## 2020-11-01 DIAGNOSIS — F1020 Alcohol dependence, uncomplicated: Secondary | ICD-10-CM | POA: Diagnosis present

## 2020-11-01 DIAGNOSIS — Z91048 Other nonmedicinal substance allergy status: Secondary | ICD-10-CM | POA: Diagnosis not present

## 2020-11-01 DIAGNOSIS — F10939 Alcohol use, unspecified with withdrawal, unspecified: Secondary | ICD-10-CM | POA: Diagnosis present

## 2020-11-01 DIAGNOSIS — R630 Anorexia: Secondary | ICD-10-CM | POA: Diagnosis present

## 2020-11-01 DIAGNOSIS — Z20822 Contact with and (suspected) exposure to covid-19: Secondary | ICD-10-CM | POA: Diagnosis present

## 2020-11-01 MED ORDER — THIAMINE HCL 100 MG PO TABS
100.0000 mg | ORAL_TABLET | Freq: Every day | ORAL | Status: DC
  Administered 2020-11-02 – 2020-11-09 (×8): 100 mg via ORAL
  Filled 2020-11-01 (×9): qty 1

## 2020-11-01 MED ORDER — CHLORDIAZEPOXIDE HCL 25 MG PO CAPS: 50.0000 mg | ORAL_CAPSULE | Freq: Three times a day (TID) | ORAL | Status: DC

## 2020-11-01 MED ORDER — CHLORDIAZEPOXIDE HCL 25 MG PO CAPS: 25.0000 mg | ORAL_CAPSULE | Freq: Three times a day (TID) | ORAL | Status: DC

## 2020-11-01 MED ORDER — CHLORDIAZEPOXIDE HCL 5 MG PO CAPS: 10.0000 mg | ORAL_CAPSULE | Freq: Two times a day (BID) | ORAL | Status: DC

## 2020-11-01 MED ORDER — TRAZODONE HCL 100 MG PO TABS
100.0000 mg | ORAL_TABLET | Freq: Every evening | ORAL | Status: DC | PRN
  Administered 2020-11-05: 100 mg via ORAL
  Filled 2020-11-01 (×2): qty 1

## 2020-11-01 MED ORDER — MAGNESIUM HYDROXIDE 400 MG/5ML PO SUSP: 30.0000 mL | Freq: Every day | ORAL | Status: DC | PRN

## 2020-11-01 MED ORDER — THIAMINE HCL 100 MG/ML IJ SOLN: 100.0000 mg | Freq: Every day | INTRAMUSCULAR | Status: DC

## 2020-11-01 MED ORDER — LORAZEPAM 2 MG PO TABS
0.0000 mg | ORAL_TABLET | Freq: Two times a day (BID) | ORAL | Status: AC
  Administered 2020-11-02 – 2020-11-04 (×4): 2 mg via ORAL
  Filled 2020-11-01 (×4): qty 1

## 2020-11-01 MED ORDER — ALUM & MAG HYDROXIDE-SIMETH 200-200-20 MG/5ML PO SUSP
30.0000 mL | ORAL | Status: DC | PRN
  Administered 2020-11-06: 30 mL via ORAL
  Filled 2020-11-01: qty 30

## 2020-11-01 MED ORDER — ACETAMINOPHEN 325 MG PO TABS
650.0000 mg | ORAL_TABLET | Freq: Four times a day (QID) | ORAL | Status: DC | PRN
  Administered 2020-11-02 – 2020-11-03 (×2): 650 mg via ORAL
  Filled 2020-11-01 (×2): qty 2

## 2020-11-01 MED ORDER — LORAZEPAM 2 MG/ML IJ SOLN: 0.0000 mg | Freq: Two times a day (BID) | INTRAMUSCULAR | Status: AC

## 2020-11-01 MED ORDER — LORAZEPAM 2 MG PO TABS
0.0000 mg | ORAL_TABLET | Freq: Four times a day (QID) | ORAL | Status: AC
  Administered 2020-11-01 (×2): 2 mg via ORAL
  Administered 2020-11-01: 1 mg via ORAL
  Administered 2020-11-02 (×2): 2 mg via ORAL
  Administered 2020-11-02: 1 mg via ORAL
  Filled 2020-11-01 (×6): qty 1

## 2020-11-01 MED ORDER — MIRTAZAPINE 15 MG PO TABS
15.0000 mg | ORAL_TABLET | Freq: Every day | ORAL | Status: DC
  Administered 2020-11-01: 15 mg via ORAL
  Filled 2020-11-01 (×2): qty 1

## 2020-11-01 MED ORDER — LORAZEPAM 2 MG/ML IJ SOLN: 0.0000 mg | Freq: Four times a day (QID) | INTRAMUSCULAR | Status: AC

## 2020-11-01 MED ORDER — ADULT MULTIVITAMIN W/MINERALS CH
1.0000 | ORAL_TABLET | Freq: Every day | ORAL | Status: DC
  Administered 2020-11-02 – 2020-11-09 (×8): 1 via ORAL
  Filled 2020-11-01 (×8): qty 1

## 2020-11-01 MED ORDER — CHLORDIAZEPOXIDE HCL 5 MG PO CAPS: 5.0000 mg | ORAL_CAPSULE | Freq: Every day | ORAL | Status: DC

## 2020-11-01 MED ORDER — FOLIC ACID 1 MG PO TABS
1.0000 mg | ORAL_TABLET | Freq: Every day | ORAL | Status: DC
  Administered 2020-11-02 – 2020-11-09 (×8): 1 mg via ORAL
  Filled 2020-11-01 (×8): qty 1

## 2020-11-01 NOTE — Plan of Care (Signed)
Patient new to the unit today, hasn't had time to progress  Problem: Education: Goal: Utilization of techniques to improve thought processes will improve Outcome: Not Progressing Goal: Knowledge of the prescribed therapeutic regimen will improve Outcome: Not Progressing   Problem: Activity: Goal: Interest or engagement in leisure activities will improve Outcome: Not Progressing Goal: Imbalance in normal sleep/wake cycle will improve Outcome: Not Progressing   Problem: Coping: Goal: Coping ability will improve Outcome: Not Progressing Goal: Will verbalize feelings Outcome: Not Progressing   Problem: Health Behavior/Discharge Planning: Goal: Ability to make decisions will improve Outcome: Not Progressing Goal: Compliance with therapeutic regimen will improve Outcome: Not Progressing   Problem: Role Relationship: Goal: Will demonstrate positive changes in social behaviors and relationships Outcome: Not Progressing   Problem: Safety: Goal: Ability to disclose and discuss suicidal ideas will improve Outcome: Not Progressing Goal: Ability to identify and utilize support systems that promote safety will improve Outcome: Not Progressing   Problem: Self-Concept: Goal: Will verbalize positive feelings about self Outcome: Not Progressing Goal: Level of anxiety will decrease Outcome: Not Progressing   Problem: Education: Goal: Knowledge of [facility] Education information/materials will improve Outcome: Not Progressing Goal: Emotional status will improve Outcome: Not Progressing Goal: Mental status will improve Outcome: Not Progressing Goal: Verbalization of understanding the information provided will improve Outcome: Not Progressing   Problem: Activity: Goal: Interest or engagement in activities will improve Outcome: Not Progressing Goal: Sleeping patterns will improve Outcome: Not Progressing   Problem: Coping: Goal: Ability to verbalize frustrations and  anger appropriately will improve Outcome: Not Progressing Goal: Ability to demonstrate self-control will improve Outcome: Not Progressing

## 2020-11-01 NOTE — ED Notes (Signed)
IVC/ Admit to BMU after 10am

## 2020-11-01 NOTE — Tx Team (Signed)
Initial Treatment Plan 11/01/2020 4:03 PM Lee Sparks VOZ:[id number]    PATIENT STRESSORS: Financial difficulties Marital or family conflict Other: homelessness   PATIENT STRENGTHS: Average or above average intelligence Communication skills   PATIENT IDENTIFIED PROBLEMS: Depression  Anxiety  Suicidal Risk                 DISCHARGE CRITERIA:  Adequate post-discharge living arrangements Improved stabilization in mood, thinking, and/or behavior Verbal commitment to aftercare and medication compliance Withdrawal symptoms are absent or subacute and managed without 24-hour nursing intervention  PRELIMINARY DISCHARGE PLAN: Outpatient therapy Placement in alternative living arrangements  PATIENT/FAMILY INVOLVEMENT: This treatment plan has been presented to and reviewed with the patient, Lee Sparks. The patient has been given the opportunity to ask questions and make suggestions.  Criss Rosales, RN 11/01/2020, 4:03 PM

## 2020-11-01 NOTE — Consult Note (Signed)
[facility] Face-to-Face Psychiatry Consult   Reason for Consult: Follow-up consult for 50 year old man with a history of alcohol abuse and depression here in the hospital with suicidal ideation Referring Physician:  Vicente Males Patient Identification: Lee Sparks MRN:  [id number] Principal Diagnosis: Severe recurrent major depression without psychotic features [facility]) Diagnosis:  Principal Problem:   Severe recurrent major depression without psychotic features (HCC) Active Problems:   Alcohol withdrawal syndrome (HCC)   Alcohol abuse   Total Time spent with patient: 30 minutes  Subjective:   Lee Sparks is a 50 y.o. male patient admitted with "I am still feeling pretty bad".  HPI: Patient seen chart reviewed.  Patient continues to endorse suicidal ideation feeling sad hopeless and negative.  No sign of delirium.  Has not had a seizure.  Vitals are under pretty good control and he has only minor shakiness.  Patient has been cooperative in patient with treatment.  Past Psychiatric History: Past history of alcohol abuse depressive symptoms see previous.  Risk to Self:   Risk to Others:   Prior Inpatient Therapy:   Prior Outpatient Therapy:    Past Medical History:  Past Medical History:  Diagnosis Date  . Alcohol-induced pancreatitis   . Anemia   . Bronchitis     Past Surgical History:  Procedure Laterality Date  . CYSTECTOMY    . ESOPHAGOGASTRODUODENOSCOPY  07/28/2011   Procedure: ESOPHAGOGASTRODUODENOSCOPY (EGD);  Surgeon: Freddy Jaksch, MD;  Location: [facility] ENDOSCOPY;  Service: Endoscopy;  Laterality: N/A;  . HERNIA REPAIR     Family History:  Family History  Problem Relation Age of Onset  . Hypertension Father   . Aneurysm Father   . Cancer Other   . Hypertension Mother   . Pneumonia Mother    Family Psychiatric  History: See previous Social History:  Social History   Substance and Sexual Activity  Alcohol Use Yes  . Alcohol/week: 50.0 standard drinks  . Types:  50 Cans of beer per week   Comment: Equivalent of two 40 oz beers daily.     Social History   Substance and Sexual Activity  Drug Use No    Social History   Socioeconomic History  . Marital status: Single    Spouse name: Not on file  . Number of children: Not on file  . Years of education: Not on file  . Highest education level: Not on file  Occupational History  . Not on file  Tobacco Use  . Smoking status: Current Every Day Smoker    Packs/day: 0.50    Years: 15.00    Pack years: 7.50    Types: Cigarettes    Last attempt to quit: 06/27/2011    Years since quitting: 9.3  . Smokeless tobacco: Never Used  Substance and Sexual Activity  . Alcohol use: Yes    Alcohol/week: 50.0 standard drinks    Types: 50 Cans of beer per week    Comment: Equivalent of two 40 oz beers daily.  . Drug use: No  . Sexual activity: Yes  Other Topics Concern  . Not on file  Social History Narrative  . Not on file   Social Determinants of Health   Financial Resource Strain: Not on file  Food Insecurity: Not on file  Transportation Needs: Not on file  Physical Activity: Not on file  Stress: Not on file  Social Connections: Not on file   Additional Social History:    Allergies:   Allergies  Allergen Reactions  . Grapefruit  Diet Or [Extra Strength Grapefruit] Hives    Pt states he has a reaction to grapefruit.     Labs:  Results for orders placed or performed during the hospital encounter of 10/30/20 (from the past 48 hour(s))  CBC with Differential     Status: Abnormal   Collection Time: 10/30/20 10:00 PM  Result Value Ref Range   WBC 13.8 (H) 4.0 - 10.5 K/uL   RBC 5.47 4.22 - 5.81 MIL/uL   Hemoglobin 16.0 13.0 - 17.0 g/dL   HCT 16.1 09.6 - 04.5 %   MCV 84.3 80.0 - 100.0 fL   MCH 29.3 26.0 - 34.0 pg   MCHC 34.7 30.0 - 36.0 g/dL   RDW 40.9 81.1 - 91.4 %   Platelets 239 150 - 400 K/uL   nRBC 0.0 0.0 - 0.2 %   Neutrophils Relative % 62 %   Neutro Abs 8.6 (H) 1.7 - 7.7 K/uL    Lymphocytes Relative 30 %   Lymphs Abs 4.1 (H) 0.7 - 4.0 K/uL   Monocytes Relative 6 %   Monocytes Absolute 0.9 0.1 - 1.0 K/uL   Eosinophils Relative 1 %   Eosinophils Absolute 0.1 0.0 - 0.5 K/uL   Basophils Relative 1 %   Basophils Absolute 0.1 0.0 - 0.1 K/uL   Immature Granulocytes 0 %   Abs Immature Granulocytes 0.05 0.00 - 0.07 K/uL    Comment: Performed at [facility], [address] [place], [place] [address]  Comprehensive metabolic panel     Status: Abnormal   Collection Time: 10/30/20 10:00 PM  Result Value Ref Range   Sodium 132 (L) 135 - 145 mmol/L   Potassium 3.7 3.5 - 5.1 mmol/L   Chloride 97 (L) 98 - 111 mmol/L   CO2 20 (L) 22 - 32 mmol/L   Glucose, Bld 170 (H) 70 - 99 mg/dL    Comment: Glucose reference range applies only to samples taken after fasting for at least 8 hours.   BUN 10 6 - 20 mg/dL   Creatinine, Ser 6.21 0.61 - 1.24 mg/dL   Calcium 8.5 (L) 8.9 - 10.3 mg/dL   Total Protein 7.4 6.5 - 8.1 g/dL   Albumin 4.1 3.5 - 5.0 g/dL   AST 50 (H) 15 - 41 U/L   ALT 53 (H) 0 - 44 U/L   Alkaline Phosphatase 51 38 - 126 U/L   Total Bilirubin 0.9 0.3 - 1.2 mg/dL   GFR, Estimated >30 >86 mL/min    Comment: (NOTE) Calculated using the CKD-EPI Creatinine Equation (2021)    Anion gap 15 5 - 15    Comment: Performed at [facility], [address], [place], [place] [address]  Troponin I (High Sensitivity)     Status: None   Collection Time: 10/30/20 10:00 PM  Result Value Ref Range   Troponin I (High Sensitivity) 7 <18 ng/L    Comment: (NOTE) Elevated high sensitivity troponin I (hsTnI) values and significant  changes across serial measurements may suggest ACS but many other  chronic and acute conditions are known to elevate hsTnI results.  Refer to the "Links" section for chest pain algorithms and additional  guidance. Performed at [facility], [address] [place], [place] [address]   CK     Status: None   Collection Time:  10/30/20 10:00 PM  Result Value Ref Range   Total CK 57 49 - 397 U/L    Comment: Performed at [facility], [address] [place], [place] [address]  Ethanol     Status: Abnormal   Collection Time: 10/30/20 10:00 PM  Result Value Ref Range   Alcohol, Ethyl (B) 341 (HH) <10 mg/dL    Comment: CRITICAL RESULT CALLED TO, READ BACK BY AND VERIFIED WITH ISABELLA [email or web address]  10/30/20 RH (NOTE) Lowest detectable limit for serum alcohol is 10 mg/dL.  For medical purposes only. Performed at [facility], [address] [place], [place] [address]   Salicylate level     Status: Abnormal   Collection Time: 10/30/20 10:00 PM  Result Value Ref Range   Salicylate Lvl <7.0 (L) 7.0 - 30.0 mg/dL    Comment: Performed at [facility], [address] [place], [place] [address]  Acetaminophen level     Status: Abnormal   Collection Time: 10/30/20 10:00 PM  Result Value Ref Range   Acetaminophen (Tylenol), Serum <10 (L) 10 - 30 ug/mL    Comment: (NOTE) Therapeutic concentrations vary significantly. A range of 10-30 ug/mL  may be an effective concentration for many patients. However, some  are best treated at concentrations outside of this range. Acetaminophen concentrations >150 ug/mL at 4 hours after ingestion  and >50 ug/mL at 12 hours after ingestion are often associated with  toxic reactions.  Performed at [facility], [address] [place], [place] [address]   TSH     Status: None   Collection Time: 10/30/20 10:00 PM  Result Value Ref Range   TSH 1.645 0.350 - 4.500 uIU/mL    Comment: Performed by a 3rd Generation assay with a functional sensitivity of <=0.01 uIU/mL. Performed at [facility], [address] [place], [place] [address]   T4, free     Status: None   Collection Time: 10/30/20 10:00 PM  Result Value Ref Range   Free T4 0.82 0.61 - 1.12 ng/dL    Comment: (NOTE) Biotin ingestion may interfere with free T4 tests. If  the results are inconsistent with the TSH level, previous test results, or the clinical presentation, then consider biotin interference. If needed, order repeat testing after stopping biotin. Performed at [facility], [address] [place], [place] [address]   Lipase, blood     Status: None   Collection Time: 10/30/20 10:00 PM  Result Value Ref Range   Lipase 45 11 - 51 U/L    Comment: Performed at [facility], [address] [place], [place] [address]  Urinalysis, Complete w Microscopic     Status: Abnormal   Collection Time: 10/30/20 10:07 PM  Result Value Ref Range   Color, Urine STRAW (A) YELLOW   APPearance CLEAR (A) CLEAR   Specific Gravity, Urine 1.003 (L) 1.005 - 1.030   pH 6.0 5.0 - 8.0   Glucose, UA NEGATIVE NEGATIVE mg/dL   Hgb urine dipstick SMALL (A) NEGATIVE   Bilirubin Urine NEGATIVE NEGATIVE   Ketones, ur NEGATIVE NEGATIVE mg/dL   Protein, ur NEGATIVE NEGATIVE mg/dL   Nitrite NEGATIVE NEGATIVE   Leukocytes,Ua NEGATIVE NEGATIVE   WBC, UA 0-5 0 - 5 WBC/hpf   Bacteria, UA NONE SEEN NONE SEEN   Squamous Epithelial / LPF NONE SEEN 0 - 5    Comment: Performed at [facility], [address], [place], [place] [address]  Urine Drug Screen, Qualitative ([facility] only)     Status: None   Collection Time: 10/30/20 10:07 PM  Result Value Ref Range   Tricyclic, Ur Screen NONE DETECTED NONE DETECTED   Amphetamines, Ur  Screen NONE DETECTED NONE DETECTED   MDMA (Ecstasy)Ur Screen NONE DETECTED NONE DETECTED   Cocaine Metabolite,Ur [place] NONE DETECTED NONE DETECTED   Opiate, Ur Screen NONE DETECTED NONE DETECTED   Phencyclidine (PCP) Ur S NONE DETECTED NONE DETECTED   Cannabinoid 50 Ng, Ur [place] NONE DETECTED NONE DETECTED   Barbiturates, Ur Screen NONE DETECTED NONE DETECTED   Benzodiazepine, Ur Scrn NONE DETECTED NONE DETECTED   Methadone Scn, Ur NONE DETECTED NONE DETECTED    Comment: (NOTE) Tricyclics + metabolites, urine    Cutoff 1000  ng/mL Amphetamines + metabolites, urine  Cutoff 1000 ng/mL MDMA (Ecstasy), urine              Cutoff 500 ng/mL Cocaine Metabolite, urine          Cutoff 300 ng/mL Opiate + metabolites, urine        Cutoff 300 ng/mL Phencyclidine (PCP), urine         Cutoff 25 ng/mL Cannabinoid, urine                 Cutoff 50 ng/mL Barbiturates + metabolites, urine  Cutoff 200 ng/mL Benzodiazepine, urine              Cutoff 200 ng/mL Methadone, urine                   Cutoff 300 ng/mL  The urine drug screen provides only a preliminary, unconfirmed analytical test result and should not be used for non-medical purposes. Clinical consideration and professional judgment should be applied to any positive drug screen result due to possible interfering substances. A more specific alternate chemical method must be used in order to obtain a confirmed analytical result. Gas chromatography / mass spectrometry (GC/MS) is the preferred confirm atory method. Performed at [facility], [address] [place], [place] [address]   Resp Panel by RT-PCR (Flu A&B, Covid) Nasopharyngeal Swab     Status: None   Collection Time: 10/30/20 10:08 PM   Specimen: Nasopharyngeal Swab; Nasopharyngeal(NP) swabs in vial transport medium  Result Value Ref Range   SARS Coronavirus 2 by RT PCR NEGATIVE NEGATIVE    Comment: (NOTE) SARS-CoV-2 target nucleic acids are NOT DETECTED.  The SARS-CoV-2 RNA is generally detectable in upper respiratory specimens during the acute phase of infection. The lowest concentration of SARS-CoV-2 viral copies this assay can detect is 138 copies/mL. A negative result does not preclude SARS-Cov-2 infection and should not be used as the sole basis for treatment or other patient management decisions. A negative result may occur with  improper specimen collection/handling, submission of specimen other than nasopharyngeal swab, presence of viral mutation(s) within the areas targeted by this  assay, and inadequate number of viral copies(<138 copies/mL). A negative result must be combined with clinical observations, patient history, and epidemiological information. The expected result is Negative.  Fact Sheet for Patients:  BloggerCourse.com  Fact Sheet for Healthcare Providers:  SeriousBroker.it  This test is no t yet approved or cleared by the [place] FDA and  has been authorized for detection and/or diagnosis of SARS-CoV-2 by FDA under an Emergency Use Authorization (EUA). This EUA will remain  in effect (meaning this test can be used) for the duration of the COVID-19 declaration under Section 564(b)(1) of the Act, 21 U.S.C.section 360bbb-3(b)(1), unless the authorization is terminated  or revoked sooner.       Influenza A by PCR NEGATIVE NEGATIVE   Influenza B by PCR NEGATIVE NEGATIVE    Comment: (  NOTE) The Xpert Xpress SARS-CoV-2/FLU/RSV plus assay is intended as an aid in the diagnosis of influenza from Nasopharyngeal swab specimens and should not be used as a sole basis for treatment. Nasal washings and aspirates are unacceptable for Xpert Xpress SARS-CoV-2/FLU/RSV testing.  Fact Sheet for Patients: BloggerCourse.com  Fact Sheet for Healthcare Providers: SeriousBroker.it  This test is not yet approved or cleared by the [place] FDA and has been authorized for detection and/or diagnosis of SARS-CoV-2 by FDA under an Emergency Use Authorization (EUA). This EUA will remain in effect (meaning this test can be used) for the duration of the COVID-19 declaration under Section 564(b)(1) of the Act, 21 U.S.C. section 360bbb-3(b)(1), unless the authorization is terminated or revoked.  Performed at [facility], [address] [place], [place] [address]   Troponin I (High Sensitivity)     Status: None   Collection Time: 10/31/20 12:30 AM   Result Value Ref Range   Troponin I (High Sensitivity) 8 <18 ng/L    Comment: (NOTE) Elevated high sensitivity troponin I (hsTnI) values and significant  changes across serial measurements may suggest ACS but many other  chronic and acute conditions are known to elevate hsTnI results.  Refer to the "Links" section for chest pain algorithms and additional  guidance. Performed at [facility], [address] [place], [place] [address]     Current Facility-Administered Medications  Medication Dose Route Frequency Provider Last Rate Last Admin  . chlordiazePOXIDE (LIBRIUM) capsule 50 mg  50 mg Oral TID Nita Sickle, MD   50 mg at 11/01/20 0850  . LORazepam (ATIVAN) injection 0-4 mg  0-4 mg Intravenous Q6H Sharman Cheek, MD       Or  . LORazepam (ATIVAN) tablet 0-4 mg  0-4 mg Oral Q6H Sharman Cheek, MD   2 mg at 11/01/20 0850  . [START ON 11/02/2020] LORazepam (ATIVAN) injection 0-4 mg  0-4 mg Intravenous Delena Serve, MD       Or  . Melene Muller ON 11/02/2020] LORazepam (ATIVAN) tablet 0-4 mg  0-4 mg Oral Q12H Sharman Cheek, MD      . thiamine tablet 100 mg  100 mg Oral Daily Sharman Cheek, MD   100 mg at 11/01/20 3151   Or  . thiamine (B-1) injection 100 mg  100 mg Intravenous Daily Sharman Cheek, MD       Current Outpatient Medications  Medication Sig Dispense Refill  . acetaminophen (TYLENOL) 500 MG tablet Take 2 tablets (1,000 mg total) by mouth every 12 (twelve) hours as needed for moderate pain. 30 tablet 0  . albuterol (PROVENTIL HFA;VENTOLIN HFA) 108 (90 Base) MCG/ACT inhaler Inhale 2 puffs into the lungs every 4 (four) hours as needed for wheezing or shortness of breath. (Patient not taking: Reported on 04/23/2020) 1 Inhaler 1  . cetirizine (ZYRTEC) 5 MG tablet Take 1 tablet (5 mg total) by mouth daily. 30 tablet 0  . lidocaine (LIDODERM) 5 % Place 1 patch onto the skin every 12 (twelve) hours. Remove & Discard patch within 12 hours or as  directed by MD 10 patch 0  . naproxen (NAPROSYN) 500 MG tablet Take 1 tablet (500 mg total) by mouth 2 (two) times daily with a meal. 20 tablet 00    Musculoskeletal: Strength & Muscle Tone: within normal limits Gait & Station: normal Patient leans: N/A            Psychiatric Specialty Exam:  Presentation  General Appearance: No data recorded Eye Contact:No data  recorded Speech:No data recorded Speech Volume:No data recorded Handedness:No data recorded  Mood and Affect  Mood:No data recorded Affect:No data recorded  Thought Process  Thought Processes:No data recorded Descriptions of Associations:No data recorded Orientation:No data recorded Thought Content:No data recorded History of Schizophrenia/Schizoaffective disorder:No  Duration of Psychotic Symptoms:No data recorded Hallucinations:No data recorded Ideas of Reference:No data recorded Suicidal Thoughts:No data recorded Homicidal Thoughts:No data recorded  Sensorium  Memory:No data recorded Judgment:No data recorded Insight:No data recorded  Executive Functions  Concentration:No data recorded Attention Span:No data recorded Recall:No data recorded Fund of Knowledge:No data recorded Language:No data recorded  Psychomotor Activity  Psychomotor Activity:No data recorded  Assets  Assets:No data recorded  Sleep  Sleep:No data recorded  Physical Exam: Physical Exam Vitals and nursing note reviewed.  Constitutional:      Appearance: Normal appearance.  HENT:     Head: Normocephalic and atraumatic.     Mouth/Throat:     Pharynx: Oropharynx is clear.  Eyes:     Pupils: Pupils are equal, round, and reactive to light.  Cardiovascular:     Rate and Rhythm: Normal rate and regular rhythm.  Pulmonary:     Effort: Pulmonary effort is normal.     Breath sounds: Normal breath sounds.  Abdominal:     General: Abdomen is flat.     Palpations: Abdomen is soft.  Musculoskeletal:        General:  Normal range of motion.  Skin:    General: Skin is warm and dry.  Neurological:     General: No focal deficit present.     Mental Status: He is alert. Mental status is at baseline.  Psychiatric:        Attention and Perception: Attention normal.        Mood and Affect: Mood is anxious and depressed.        Speech: Speech is delayed.        Behavior: Behavior is slowed.        Thought Content: Thought content includes suicidal ideation.        Cognition and Memory: Memory is impaired.    Review of Systems  Constitutional: Negative.   HENT: Negative.   Eyes: Negative.   Respiratory: Negative.   Cardiovascular: Negative.   Gastrointestinal: Negative.   Musculoskeletal: Negative.   Skin: Negative.   Neurological: Negative.   Psychiatric/Behavioral: Positive for depression, substance abuse and suicidal ideas. The patient is nervous/anxious.    Blood pressure 125/74, pulse 86, temperature 98.2 F (36.8 C), temperature source Oral, resp. rate 17, height 5\' 9"  (1.753 m), SpO2 100 %. Body mass index is 26.58 kg/m.  Treatment Plan Summary: Medication management and Plan Patient continues to endorse suicidal ideation and hopelessness and negative feelings.  He is cooperative with treatment here and is understanding of the plan for admission to the hospital and agreeable to it.  Vitals as I mentioned are stable.  No new labs today.  Chest pain did not turn out to be anything cardiac.  Plan is for admission to the psychiatric unit probably in the early afternoon.  No change to orders.  Disposition: Recommend psychiatric Inpatient admission when medically cleared.  Mordecai RasmussenJohn Remberto Lienhard, MD 11/01/2020 12:01 PM

## 2020-11-01 NOTE — Plan of Care (Signed)
New admission  Problem: Education: Goal: Utilization of techniques to improve thought processes will improve Outcome: Not Progressing Goal: Knowledge of the prescribed therapeutic regimen will improve Outcome: Not Progressing   Problem: Activity: Goal: Interest or engagement in leisure activities will improve Outcome: Not Progressing Goal: Imbalance in normal sleep/wake cycle will improve Outcome: Not Progressing   Problem: Coping: Goal: Coping ability will improve Outcome: Not Progressing Goal: Will verbalize feelings Outcome: Not Progressing   Problem: Health Behavior/Discharge Planning: Goal: Ability to make decisions will improve Outcome: Not Progressing Goal: Compliance with therapeutic regimen will improve Outcome: Not Progressing   Problem: Role Relationship: Goal: Will demonstrate positive changes in social behaviors and relationships Outcome: Not Progressing   Problem: Safety: Goal: Ability to disclose and discuss suicidal ideas will improve Outcome: Not Progressing Goal: Ability to identify and utilize support systems that promote safety will improve Outcome: Not Progressing   Problem: Self-Concept: Goal: Will verbalize positive feelings about self Outcome: Not Progressing Goal: Level of anxiety will decrease Outcome: Not Progressing   Problem: Education: Goal: Knowledge of [facility] Education information/materials will improve Outcome: Not Progressing Goal: Emotional status will improve Outcome: Not Progressing Goal: Mental status will improve Outcome: Not Progressing Goal: Verbalization of understanding the information provided will improve Outcome: Not Progressing   Problem: Activity: Goal: Interest or engagement in activities will improve Outcome: Not Progressing Goal: Sleeping patterns will improve Outcome: Not Progressing   Problem: Coping: Goal: Ability to verbalize frustrations and anger appropriately will improve Outcome: Not  Progressing Goal: Ability to demonstrate self-control will improve Outcome: Not Progressing   Problem: Health Behavior/Discharge Planning: Goal: Identification of resources available to assist in meeting health care needs will improve Outcome: Not Progressing Goal: Compliance with treatment plan for underlying cause of condition will improve Outcome: Not Progressing   Problem: Physical Regulation: Goal: Ability to maintain clinical measurements within normal limits will improve Outcome: Not Progressing   Problem: Safety: Goal: Periods of time without injury will increase Outcome: Not Progressing   Problem: Education: Goal: Ability to state activities that reduce stress will improve Outcome: Not Progressing   Problem: Coping: Goal: Ability to identify and develop effective coping behavior will improve Outcome: Not Progressing   Problem: Self-Concept: Goal: Ability to identify factors that promote anxiety will improve Outcome: Not Progressing Goal: Level of anxiety will decrease Outcome: Not Progressing Goal: Ability to modify response to factors that promote anxiety will improve Outcome: Not Progressing   Problem: Education: Goal: Ability to make informed decisions regarding treatment will improve Outcome: Not Progressing   Problem: Coping: Goal: Coping ability will improve Outcome: Not Progressing   Problem: Health Behavior/Discharge Planning: Goal: Identification of resources available to assist in meeting health care needs will improve Outcome: Not Progressing   Problem: Medication: Goal: Compliance with prescribed medication regimen will improve Outcome: Not Progressing   Problem: Self-Concept: Goal: Ability to disclose and discuss suicidal ideas will improve Outcome: Not Progressing Goal: Will verbalize positive feelings about self Outcome: Not Progressing

## 2020-11-01 NOTE — ED Notes (Signed)
Pt discharged under IVC to BMU. VS stable. Belongings sent with patient. Pt calm and cooperative.
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
## 2020-11-01 NOTE — [facility] Suicide Risk Assessment (Signed)
[facility] Admission Suicide Risk Assessment   Nursing information obtained from:    Demographic factors:    Current Mental Status:    Loss Factors:    Historical Factors:    Risk Reduction Factors:     Total Time spent with patient: 1 hour Principal Problem: Severe recurrent major depression without psychotic features (HCC) Diagnosis:  Principal Problem:   Severe recurrent major depression without psychotic features (HCC) Active Problems:   Hypertension   Alcohol withdrawal syndrome (HCC)   Severe alcohol use disorder (HCC)  Subjective Data: 50 year old male with history of alcohol use disorder and depression presenting to suicidal ideations with plan to drink himself to death. See H&P for details  Continued Clinical Symptoms:  Alcohol Use Disorder Identification Test Final Score (AUDIT): 31 The "Alcohol Use Disorders Identification Test", Guidelines for Use in Primary Care, Second Edition.  World Science writer [facility]). Score between 0-7:  no or low risk or alcohol related problems. Score between 8-15:  moderate risk of alcohol related problems. Score between 16-19:  high risk of alcohol related problems. Score 20 or above:  warrants further diagnostic evaluation for alcohol dependence and treatment.   CLINICAL FACTORS:   Severe Anxiety and/or Agitation Depression:   Comorbid alcohol abuse/dependence Hopelessness Insomnia Severe Alcohol/Substance Abuse/Dependencies Unstable or Poor Therapeutic Relationship Previous Psychiatric Diagnoses and Treatments   Musculoskeletal: Strength & Muscle Tone: within normal limits Gait & Station: unsteady Patient leans: Front  Psychiatric Specialty Exam:  Presentation  General Appearance: Disheveled  Eye Contact:Fair  Speech:Normal Rate  Speech Volume:Decreased  Handedness:Right   Mood and Affect  Mood:Depressed  Affect:Congruent; Tearful   Thought Process  Thought Processes:Goal Directed  Descriptions of  Associations:Intact  Orientation:Full (Time, Place and Person)  Thought Content:Rumination  History of Schizophrenia/Schizoaffective disorder:No  Duration of Psychotic Symptoms:No data recorded Hallucinations:Hallucinations: None  Ideas of Reference:None  Suicidal Thoughts:Suicidal Thoughts: Yes, Active SI Active Intent and/or Plan: With Plan; Without Means to Carry Out  Homicidal Thoughts:Homicidal Thoughts: No   Sensorium  Memory:Immediate Poor; Recent Fair; Remote Fair  Judgment:Impaired  Insight:Present   Executive Functions  Concentration:Poor  Attention Span:Poor  Recall:Fair  Fund of Knowledge:Fair  Language:Fair   Psychomotor Activity  Psychomotor Activity:Psychomotor Activity: Tremor   Assets  Assets:Desire for Improvement; Resilience   Sleep  Sleep:Sleep: Poor    Physical Exam: Physical Exam Vitals and nursing note reviewed.  Constitutional:      Appearance: He is ill-appearing.  HENT:     Head: Normocephalic and atraumatic.     Right Ear: External ear normal.     Left Ear: External ear normal.     Nose: Nose normal.     Mouth/Throat:     Mouth: Mucous membranes are moist.     Pharynx: Oropharynx is clear.  Eyes:     Extraocular Movements: Extraocular movements intact.     Conjunctiva/sclera: Conjunctivae normal.     Pupils: Pupils are equal, round, and reactive to light.  Cardiovascular:     Rate and Rhythm: Tachycardia present.     Pulses: Normal pulses.  Pulmonary:     Effort: Pulmonary effort is normal.     Breath sounds: Normal breath sounds.  Abdominal:     General: Abdomen is flat.     Palpations: Abdomen is soft.  Musculoskeletal:        General: No swelling. Normal range of motion.     Cervical back: Normal range of motion and neck supple.  Skin:    General: Skin is warm and  dry.  Neurological:     General: No focal deficit present.     Mental Status: He is alert and oriented to person, place, and time.   Psychiatric:        Attention and Perception: Perception normal. He is inattentive.        Mood and Affect: Mood is depressed.        Speech: Speech normal.        Behavior: Behavior is cooperative.        Thought Content: Thought content includes suicidal ideation. Thought content includes suicidal plan.        Cognition and Memory: Cognition is impaired. Memory is impaired.        Judgment: Judgment normal.    Review of Systems  Constitutional: Positive for malaise/fatigue and weight loss.  HENT: Negative for congestion and sore throat.   Eyes: Negative for blurred vision and double vision.  Respiratory: Negative for cough and shortness of breath.   Cardiovascular: Positive for chest pain. Negative for palpitations.  Gastrointestinal: Positive for nausea. Negative for constipation, diarrhea and vomiting.  Genitourinary: Negative for dysuria and urgency.  Musculoskeletal: Negative for back pain and myalgias.  Skin: Positive for itching. Negative for rash.  Neurological: Positive for dizziness and tremors.  Endo/Heme/Allergies: Positive for environmental allergies. Does not bruise/bleed easily.  Psychiatric/Behavioral: Positive for depression, substance abuse and suicidal ideas. The patient is nervous/anxious and has insomnia.    Blood pressure (!) 149/99, pulse (!) 111, temperature 98.6 F (37 C), temperature source Oral, resp. rate 18, SpO2 97 %. There is no height or weight on file to calculate BMI.   COGNITIVE FEATURES THAT CONTRIBUTE TO RISK:  Closed-mindedness    SUICIDE RISK:   Moderate:  Frequent suicidal ideation with limited intensity, and duration, some specificity in terms of plans, no associated intent, good self-control, limited dysphoria/symptomatology, some risk factors present, and identifiable protective factors, including available and accessible social support.  PLAN OF CARE: Continue inpatient admission, see H&P for full details  I certify that inpatient  services furnished can reasonably be expected to improve the patient's condition.   [name], MD 11/01/2020, 3:31 PM

## 2020-11-01 NOTE — H&P (Signed)
Psychiatric Admission Assessment Adult  Patient Identification: Lee Sparks MRN:  [id number] Date of Evaluation:  11/01/2020 Chief Complaint:  Severe recurrent major depression without psychotic features (HCC) [F33.2] Principal Diagnosis: Severe recurrent major depression without psychotic features (HCC) Diagnosis:  Principal Problem:   Severe recurrent major depression without psychotic features (HCC) Active Problems:   Hypertension   Alcohol withdrawal syndrome (HCC)   Severe alcohol use disorder (HCC)  CC "Am I still in [place]?"  History of Present Illness: Patient is a 50 year old male with alcohol use disorder and major depressive disorder presenting for suicidal ideations with plan to drink himself to death. He has been drinking heavily for the last two months. He states he has at least 15 beers per day, and when he wakes up he needs an eye-opener due to tremors. His blood alcohol level was 340 on arrival to the emergency room. He notes that he feels no reason to keep living because his family has abandoned him. He is estranged from his daughter who will not allow him to see his grandchild. His brothers are also no longer supporting him. 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 He endorses hopelessness, helplessness, poor sleep, poor appetite, weight loss, low energy, poor concentration, and anhedonia. At time of interview he also has visible tremor, reports anxiety, nausea with dry heaving, headache, itching skin, and seeing flashing lights. His CIWA score is a 13. Nursing staff alerted of score, and patient will be provided Ativan 2 mg per CIWA protocol. Discussed RBA of starting lexapro vs. remeron for treatment of depression, and patient opts for remeron.  Associated Signs/Symptoms: Depression Symptoms:  depressed mood, anhedonia, insomnia, fatigue, feelings of worthlessness/guilt, difficulty concentrating, hopelessness, impaired  memory, recurrent thoughts of death, suicidal thoughts with specific plan, anxiety, disturbed sleep, weight loss, decreased appetite, Duration of Depression Symptoms: Greater than two weeks  (Hypo) Manic Symptoms:  Denies Anxiety Symptoms:  Excessive Worry, Panic Symptoms, Psychotic Symptoms:  Denies PTSD Symptoms: Negative Total Time spent with patient: 1 hour  Past Psychiatric History: Has been to [facility] in the past for detox. No history of DTs or seizures from alcohol withdrawals, but he does experience tremors. No history of treatment for depression. No current outpatient provider.   Is the patient at risk to self? Yes.    Has the patient been a risk to self in the past 6 months? No.  Has the patient been a risk to self within the distant past? No.  Is the patient a risk to others? No.  Has the patient been a risk to others in the past 6 months? No.  Has the patient been a risk to others within the distant past? No.   Prior Inpatient Therapy:   Prior Outpatient Therapy:    Alcohol Screening: 1. How often do you have a drink containing alcohol?: 4 or more times a week 2. How many drinks containing alcohol do you have on a typical day when you are drinking?: 10 or more 3. How often do you have six or more drinks on one occasion?: Daily or almost daily AUDIT-C Score: 12 4. How often during the last year have you found that you were not able to stop drinking once you had started?: Daily or almost daily 5. How often during the last year have you failed to do what was normally expected from you because of drinking?: Daily or almost daily 6. How often during the last year have  you needed a first drink in the morning to get yourself going after a heavy drinking session?: Daily or almost daily 7. How often during the last year have you had a feeling of guilt of remorse after drinking?: Daily or almost daily 8. How often during the last year have you been unable to remember what happened  the night before because you had been drinking?: Weekly 9. Have you or someone else been injured as a result of your drinking?: No 10. Has a relative or friend or a doctor or another health worker been concerned about your drinking or suggested you cut down?: No Alcohol Use Disorder Identification Test Final Score (AUDIT): 31 Alcohol Brief Interventions/Follow-up: Alcohol education/Brief advice Substance Abuse History in the last 12 months:  Yes.   Consequences of Substance Abuse: Withdrawal Symptoms:   Headaches Nausea Tremors job loss, estrangement from family Previous Psychotropic Medications: No  Psychological Evaluations: Yes  Past Medical History:  Past Medical History:  Diagnosis Date  . Alcohol-induced pancreatitis   . Anemia   . Bronchitis     Past Surgical History:  Procedure Laterality Date  . CYSTECTOMY    . ESOPHAGOGASTRODUODENOSCOPY  07/28/2011   Procedure: ESOPHAGOGASTRODUODENOSCOPY (EGD);  Surgeon: Freddy Jaksch, MD;  Location: [facility] ENDOSCOPY;  Service: Endoscopy;  Laterality: N/A;  . HERNIA REPAIR     Family History:  Family History  Problem Relation Age of Onset  . Hypertension Father   . Aneurysm Father   . Cancer Other   . Hypertension Mother   . Pneumonia Mother    Family Psychiatric  History: None reported  Tobacco Screening:   Social History:  Social History   Substance and Sexual Activity  Alcohol Use Yes  . Alcohol/week: 50.0 standard drinks  . Types: 50 Cans of beer per week   Comment: Equivalent of two 40 oz beers daily.     Social History   Substance and Sexual Activity  Drug Use No    Additional Social History:                           Allergies:   Allergies  Allergen Reactions  . Grapefruit Diet Or [Extra Strength Grapefruit] Hives    Pt states he has a reaction to grapefruit.    Lab Results:  Results for orders placed or performed during the hospital encounter of 10/30/20 (from the past 48 hour(s))  CBC with  Differential     Status: Abnormal   Collection Time: 10/30/20 10:00 PM  Result Value Ref Range   WBC 13.8 (H) 4.0 - 10.5 K/uL   RBC 5.47 4.22 - 5.81 MIL/uL   Hemoglobin 16.0 13.0 - 17.0 g/dL   HCT 16.1 09.6 - 04.5 %   MCV 84.3 80.0 - 100.0 fL   MCH 29.3 26.0 - 34.0 pg   MCHC 34.7 30.0 - 36.0 g/dL   RDW 40.9 81.1 - 91.4 %   Platelets 239 150 - 400 K/uL   nRBC 0.0 0.0 - 0.2 %   Neutrophils Relative % 62 %   Neutro Abs 8.6 (H) 1.7 - 7.7 K/uL   Lymphocytes Relative 30 %   Lymphs Abs 4.1 (H) 0.7 - 4.0 K/uL   Monocytes Relative 6 %   Monocytes Absolute 0.9 0.1 - 1.0 K/uL   Eosinophils Relative 1 %   Eosinophils Absolute 0.1 0.0 - 0.5 K/uL   Basophils Relative 1 %   Basophils Absolute 0.1 0.0 - 0.1 K/uL  Immature Granulocytes 0 %   Abs Immature Granulocytes 0.05 0.00 - 0.07 K/uL    Comment: Performed at [facility], [address] [place], [place] [address]  Comprehensive metabolic panel     Status: Abnormal   Collection Time: 10/30/20 10:00 PM  Result Value Ref Range   Sodium 132 (L) 135 - 145 mmol/L   Potassium 3.7 3.5 - 5.1 mmol/L   Chloride 97 (L) 98 - 111 mmol/L   CO2 20 (L) 22 - 32 mmol/L   Glucose, Bld 170 (H) 70 - 99 mg/dL    Comment: Glucose reference range applies only to samples taken after fasting for at least 8 hours.   BUN 10 6 - 20 mg/dL   Creatinine, Ser 2.950.77 0.61 - 1.24 mg/dL   Calcium 8.5 (L) 8.9 - 10.3 mg/dL   Total Protein 7.4 6.5 - 8.1 g/dL   Albumin 4.1 3.5 - 5.0 g/dL   AST 50 (H) 15 - 41 U/L   ALT 53 (H) 0 - 44 U/L   Alkaline Phosphatase 51 38 - 126 U/L   Total Bilirubin 0.9 0.3 - 1.2 mg/dL   GFR, Estimated >28>60 >41>60 mL/min    Comment: (NOTE) Calculated using the CKD-EPI Creatinine Equation (2021)    Anion gap 15 5 - 15    Comment: Performed at [facility], [address] [place], [place] [address]  Troponin I (High Sensitivity)     Status: None   Collection Time: 10/30/20 10:00 PM  Result Value Ref Range   Troponin I (High  Sensitivity) 7 <18 ng/L    Comment: (NOTE) Elevated high sensitivity troponin I (hsTnI) values and significant  changes across serial measurements may suggest ACS but many other  chronic and acute conditions are known to elevate hsTnI results.  Refer to the "Links" section for chest pain algorithms and additional  guidance. Performed at [facility], [address] [place], [place] [address]   CK     Status: None   Collection Time: 10/30/20 10:00 PM  Result Value Ref Range   Total CK 57 49 - 397 U/L    Comment: Performed at [facility], [address] [place], [place] [address]  Ethanol     Status: Abnormal   Collection Time: 10/30/20 10:00 PM  Result Value Ref Range   Alcohol, Ethyl (B) 341 (HH) <10 mg/dL    Comment: CRITICAL RESULT CALLED TO, READ BACK BY AND VERIFIED WITH ISABELLA [email or web address]@[email or web address]  10/30/20 RH (NOTE) Lowest detectable limit for serum alcohol is 10 mg/dL.  For medical purposes only. Performed at [facility], [address] [place], [place] [address]   Salicylate level     Status: Abnormal   Collection Time: 10/30/20 10:00 PM  Result Value Ref Range   Salicylate Lvl <7.0 (L) 7.0 - 30.0 mg/dL    Comment: Performed at [facility], [address] [place], [place] [address]  Acetaminophen level     Status: Abnormal   Collection Time: 10/30/20 10:00 PM  Result Value Ref Range   Acetaminophen (Tylenol), Serum <10 (L) 10 - 30 ug/mL    Comment: (NOTE) Therapeutic concentrations vary significantly. A range of 10-30 ug/mL  may be an effective concentration for many patients. However, some  are best treated at concentrations outside of this range. Acetaminophen concentrations >150 ug/mL at 4 hours after ingestion  and >50 ug/mL at 12 hours after ingestion are often associated with  toxic reactions.  Performed at [facility],  [address] [place], [place] [address]   TSH     Status: None   Collection Time:  10/30/20 10:00 PM  Result Value Ref Range   TSH 1.645 0.350 - 4.500 uIU/mL    Comment: Performed by a 3rd Generation assay with a functional sensitivity of <=0.01 uIU/mL. Performed at [facility], [address] [place], [place] [address]   T4, free     Status: None   Collection Time: 10/30/20 10:00 PM  Result Value Ref Range   Free T4 0.82 0.61 - 1.12 ng/dL    Comment: (NOTE) Biotin ingestion may interfere with free T4 tests. If the results are inconsistent with the TSH level, previous test results, or the clinical presentation, then consider biotin interference. If needed, order repeat testing after stopping biotin. Performed at [facility], [address] [place], [place] [address]   Lipase, blood     Status: None   Collection Time: 10/30/20 10:00 PM  Result Value Ref Range   Lipase 45 11 - 51 U/L    Comment: Performed at [facility], [address] [place], [place] [address]  Urinalysis, Complete w Microscopic     Status: Abnormal   Collection Time: 10/30/20 10:07 PM  Result Value Ref Range   Color, Urine STRAW (A) YELLOW   APPearance CLEAR (A) CLEAR   Specific Gravity, Urine 1.003 (L) 1.005 - 1.030   pH 6.0 5.0 - 8.0   Glucose, UA NEGATIVE NEGATIVE mg/dL   Hgb urine dipstick SMALL (A) NEGATIVE   Bilirubin Urine NEGATIVE NEGATIVE   Ketones, ur NEGATIVE NEGATIVE mg/dL   Protein, ur NEGATIVE NEGATIVE mg/dL   Nitrite NEGATIVE NEGATIVE   Leukocytes,Ua NEGATIVE NEGATIVE   WBC, UA 0-5 0 - 5 WBC/hpf   Bacteria, UA NONE SEEN NONE SEEN   Squamous Epithelial / LPF NONE SEEN 0 - 5    Comment: Performed at [facility], [address], [place], [place] [address]  Urine Drug Screen, Qualitative ([facility] only)     Status: None   Collection Time: 10/30/20 10:07 PM  Result Value Ref Range   Tricyclic, Ur Screen NONE DETECTED NONE DETECTED   Amphetamines, Ur Screen NONE DETECTED NONE DETECTED   MDMA (Ecstasy)Ur Screen NONE DETECTED NONE  DETECTED   Cocaine Metabolite,Ur [place] NONE DETECTED NONE DETECTED   Opiate, Ur Screen NONE DETECTED NONE DETECTED   Phencyclidine (PCP) Ur S NONE DETECTED NONE DETECTED   Cannabinoid 50 Ng, Ur  NONE DETECTED NONE DETECTED   Barbiturates, Ur Screen NONE DETECTED NONE DETECTED   Benzodiazepine, Ur Scrn NONE DETECTED NONE DETECTED   Methadone Scn, Ur NONE DETECTED NONE DETECTED    Comment: (NOTE) Tricyclics + metabolites, urine    Cutoff 1000 ng/mL Amphetamines + metabolites, urine  Cutoff 1000 ng/mL MDMA (Ecstasy), urine              Cutoff 500 ng/mL Cocaine Metabolite, urine          Cutoff 300 ng/mL Opiate + metabolites, urine        Cutoff 300 ng/mL Phencyclidine (PCP), urine         Cutoff 25 ng/mL Cannabinoid, urine                 Cutoff 50 ng/mL Barbiturates + metabolites, urine  Cutoff 200 ng/mL Benzodiazepine, urine              Cutoff 200 ng/mL Methadone, urine  Cutoff 300 ng/mL  The urine drug screen provides only a preliminary, unconfirmed analytical test result and should not be used for non-medical purposes. Clinical consideration and professional judgment should be applied to any positive drug screen result due to possible interfering substances. A more specific alternate chemical method must be used in order to obtain a confirmed analytical result. Gas chromatography / mass spectrometry (GC/MS) is the preferred confirm atory method. Performed at [facility], [address] [place], [place] [address]   Resp Panel by RT-PCR (Flu A&B, Covid) Nasopharyngeal Swab     Status: None   Collection Time: 10/30/20 10:08 PM   Specimen: Nasopharyngeal Swab; Nasopharyngeal(NP) swabs in vial transport medium  Result Value Ref Range   SARS Coronavirus 2 by RT PCR NEGATIVE NEGATIVE    Comment: (NOTE) SARS-CoV-2 target nucleic acids are NOT DETECTED.  The SARS-CoV-2 RNA is generally detectable in upper respiratory specimens during the acute phase of  infection. The lowest concentration of SARS-CoV-2 viral copies this assay can detect is 138 copies/mL. A negative result does not preclude SARS-Cov-2 infection and should not be used as the sole basis for treatment or other patient management decisions. A negative result may occur with  improper specimen collection/handling, submission of specimen other than nasopharyngeal swab, presence of viral mutation(s) within the areas targeted by this assay, and inadequate number of viral copies(<138 copies/mL). A negative result must be combined with clinical observations, patient history, and epidemiological information. The expected result is Negative.  Fact Sheet for Patients:  BloggerCourse.com  Fact Sheet for Healthcare Providers:  SeriousBroker.it  This test is no t yet approved or cleared by the [place] FDA and  has been authorized for detection and/or diagnosis of SARS-CoV-2 by FDA under an Emergency Use Authorization (EUA). This EUA will remain  in effect (meaning this test can be used) for the duration of the COVID-19 declaration under Section 564(b)(1) of the Act, 21 U.S.C.section 360bbb-3(b)(1), unless the authorization is terminated  or revoked sooner.       Influenza A by PCR NEGATIVE NEGATIVE   Influenza B by PCR NEGATIVE NEGATIVE    Comment: (NOTE) The Xpert Xpress SARS-CoV-2/FLU/RSV plus assay is intended as an aid in the diagnosis of influenza from Nasopharyngeal swab specimens and should not be used as a sole basis for treatment. Nasal washings and aspirates are unacceptable for Xpert Xpress SARS-CoV-2/FLU/RSV testing.  Fact Sheet for Patients: BloggerCourse.com  Fact Sheet for Healthcare Providers: SeriousBroker.it  This test is not yet approved or cleared by the [place] FDA and has been authorized for detection and/or diagnosis of SARS-CoV-2 by FDA  under an Emergency Use Authorization (EUA). This EUA will remain in effect (meaning this test can be used) for the duration of the COVID-19 declaration under Section 564(b)(1) of the Act, 21 U.S.C. section 360bbb-3(b)(1), unless the authorization is terminated or revoked.  Performed at [facility], [address] [place], [place] [address]   Troponin I (High Sensitivity)     Status: None   Collection Time: 10/31/20 12:30 AM  Result Value Ref Range   Troponin I (High Sensitivity) 8 <18 ng/L    Comment: (NOTE) Elevated high sensitivity troponin I (hsTnI) values and significant  changes across serial measurements may suggest ACS but many other  chronic and acute conditions are known to elevate hsTnI results.  Refer to the "Links" section for chest pain algorithms and additional  guidance. Performed at [facility], [address] [place], [place] [address]  Blood Alcohol level:  Lab Results  Component Value Date   ETH 341 [facility]) 10/30/2020   ETH <10 07/31/2020    Metabolic Disorder Labs:  Lab Results  Component Value Date   HGBA1C 5.7 (H) 05/30/2020   No results found for: PROLACTIN Lab Results  Component Value Date   CHOL 206 (H) 05/30/2020   TRIG 187 (H) 05/30/2020   HDL 28 (L) 05/30/2020   CHOLHDL 7.4 (H) 05/30/2020   LDLCALC 144 (H) 05/30/2020   LDLCALC 98 06/28/2013    Current Medications: Current Facility-Administered Medications  Medication Dose Route Frequency Provider Last Rate Last Admin  . acetaminophen (TYLENOL) tablet 650 mg  650 mg Oral Q6H PRN [name], [name], MD      . alum & mag hydroxide-simeth (MAALOX/MYLANTA) 200-200-20 MG/5ML suspension 30 mL  30 mL Oral Q4H PRN [name], [name], MD      . chlordiazePOXIDE (LIBRIUM) capsule 50 mg  50 mg Oral TID [name], [name], MD      . LORazepam (ATIVAN) injection 0-4 mg  0-4 mg Intravenous Q6H [name], [name], MD   2 mg at 11/01/20 1529   Or  . LORazepam (ATIVAN) tablet 0-4 mg  0-4 mg  Oral Q6H [name], [name], MD      . Melene Muller ON 11/02/2020] LORazepam (ATIVAN) injection 0-4 mg  0-4 mg Intravenous Q12H [name], [name], MD       Or  . Melene Muller ON 11/02/2020] LORazepam (ATIVAN) tablet 0-4 mg  0-4 mg Oral Q12H [name], [name], MD      . magnesium hydroxide (MILK OF MAGNESIA) suspension 30 mL  30 mL Oral Daily PRN [name], [name], MD      . Melene Muller ON 11/02/2020] thiamine tablet 100 mg  100 mg Oral Daily [name], [name], MD       Or  . Melene Muller ON 11/02/2020] thiamine (B-1) injection 100 mg  100 mg Intravenous Daily [name], [name], MD      . traZODone (DESYREL) tablet 100 mg  100 mg Oral QHS PRN [name], [name], MD       PTA Medications: Medications Prior to Admission  Medication Sig Dispense Refill Last Dose  . acetaminophen (TYLENOL) 500 MG tablet Take 2 tablets (1,000 mg total) by mouth every 12 (twelve) hours as needed for moderate pain. 30 tablet 0   . albuterol (PROVENTIL HFA;VENTOLIN HFA) 108 (90 Base) MCG/ACT inhaler Inhale 2 puffs into the lungs every 4 (four) hours as needed for wheezing or shortness of breath. (Patient not taking: Reported on 04/23/2020) 1 Inhaler 1   . cetirizine (ZYRTEC) 5 MG tablet Take 1 tablet (5 mg total) by mouth daily. 30 tablet 0   . lidocaine (LIDODERM) 5 % Place 1 patch onto the skin every 12 (twelve) hours. Remove & Discard patch within 12 hours or as directed by MD 10 patch 0   . naproxen (NAPROSYN) 500 MG tablet Take 1 tablet (500 mg total) by mouth 2 (two) times daily with a meal. 20 tablet 00     Musculoskeletal: Strength & Muscle Tone: within normal limits Gait & Station: unsteady Patient leans: Front    Psychiatric Specialty Exam:  Presentation  General Appearance: Disheveled  Eye Contact:Fair  Speech:Normal Rate  Speech Volume:Decreased  Handedness:Right   Mood and Affect  Mood:Depressed  Affect:Congruent; Tearful   Thought Process  Thought Processes:Goal Directed  Duration of Psychotic Symptoms: No data recorded Past  Diagnosis of Schizophrenia or Psychoactive disorder: No  Descriptions  of Associations:Intact  Orientation:Full (Time, Place and Person)  Thought Content:Rumination  Hallucinations:Hallucinations: None  Ideas of Reference:None  Suicidal Thoughts:Suicidal Thoughts: Yes, Active SI Active Intent and/or Plan: With Plan; Without Means to Carry Out  Homicidal Thoughts:Homicidal Thoughts: No   Sensorium  Memory:Immediate Poor; Recent Fair; Remote Fair  Judgment:Impaired  Insight:Present   Executive Functions  Concentration:Poor  Attention Span:Poor  Recall:Fair  Fund of Knowledge:Fair  Language:Fair   Psychomotor Activity  Psychomotor Activity:Psychomotor Activity: Tremor   Assets  Assets:Desire for Improvement; Resilience   Sleep  Sleep:Sleep: Poor    Physical Exam: Physical Exam Vitals and nursing note reviewed.  Constitutional:      Appearance: He is ill-appearing.  HENT:     Head: Normocephalic and atraumatic.     Right Ear: External ear normal.     Left Ear: External ear normal.     Nose: Nose normal.     Mouth/Throat:     Mouth: Mucous membranes are moist.     Pharynx: Oropharynx is clear.  Eyes:     Extraocular Movements: Extraocular movements intact.     Conjunctiva/sclera: Conjunctivae normal.     Pupils: Pupils are equal, round, and reactive to light.  Cardiovascular:     Rate and Rhythm: Tachycardia present.     Pulses: Normal pulses.  Pulmonary:     Effort: Pulmonary effort is normal.     Breath sounds: Normal breath sounds.  Abdominal:     General: Abdomen is flat.     Palpations: Abdomen is soft.  Musculoskeletal:        General: No swelling. Normal range of motion.     Cervical back: Normal range of motion and neck supple.  Skin:    General: Skin is warm and dry.  Neurological:     General: No focal deficit present.     Mental Status: He is alert and oriented to person, place, and time.  Psychiatric:        Attention and  Perception: Perception normal. He is inattentive.        Mood and Affect: Mood is depressed.        Speech: Speech normal.        Behavior: Behavior is cooperative.        Thought Content: Thought content includes suicidal ideation. Thought content includes suicidal plan.        Cognition and Memory: Cognition is impaired. Memory is impaired.        Judgment: Judgment normal.    Review of Systems  Constitutional: Positive for malaise/fatigue and weight loss.  HENT: Negative for congestion and sore throat.   Eyes: Negative for blurred vision and double vision.  Respiratory: Negative for cough and shortness of breath.   Cardiovascular: Positive for chest pain. Negative for palpitations.  Gastrointestinal: Positive for nausea. Negative for constipation, diarrhea and vomiting.  Genitourinary: Negative for dysuria and urgency.  Musculoskeletal: Negative for back pain and myalgias.  Skin: Positive for itching. Negative for rash.  Neurological: Positive for dizziness and tremors.  Endo/Heme/Allergies: Positive for environmental allergies. Does not bruise/bleed easily.  Psychiatric/Behavioral: Positive for depression, substance abuse and suicidal ideas. The patient is nervous/anxious and has insomnia.   Blood pressure (!) 149/99, pulse (!) 111, temperature 98.6 F (37 C), temperature source Oral, resp. rate 18, SpO2 97 %. There is no height or weight on file to calculate BMI.  Treatment Plan Summary: Daily contact with patient to assess and evaluate symptoms and progress in treatment and Medication management  1)MDD, recurrent, severe without psychotic features- unstable. Continues to endorse suicidal ideations with plan to drink himself to death. Sees no reason to keep living - Start Remeron 15 mg QHS  2) Alcohol Use Disorder, severe with acute alcohol withdrawals - CIWA protocol in place - MVI, thiamine, folic acid - Will discuss FDA approved medications for alcohol use disorder during  admission  Observation Level/Precautions:  Detox  Laboratory:  Completed in ED  Psychotherapy:    Medications:    Consultations:    Discharge Concerns:    Estimated LOS:  Other:     Physician Treatment Plan for Primary Diagnosis: Severe recurrent major depression without psychotic features (HCC) Long Term Goal(s): Improvement in symptoms so as ready for discharge  Short Term Goals: Ability to identify changes in lifestyle to reduce recurrence of condition will improve, Ability to verbalize feelings will improve, Ability to disclose and discuss suicidal ideas, Ability to demonstrate self-control will improve, Ability to identify and develop effective coping behaviors will improve, Ability to maintain clinical measurements within normal limits will improve, Compliance with prescribed medications will improve and Ability to identify triggers associated with substance abuse/mental health issues will improve  Physician Treatment Plan for Secondary Diagnosis: Principal Problem:   Severe recurrent major depression without psychotic features (HCC) Active Problems:   Hypertension   Alcohol withdrawal syndrome (HCC)   Severe alcohol use disorder (HCC)  Long Term Goal(s): Improvement in symptoms so as ready for discharge  Short Term Goals: Ability to identify changes in lifestyle to reduce recurrence of condition will improve, Ability to verbalize feelings will improve, Ability to disclose and discuss suicidal ideas, Ability to demonstrate self-control will improve, Ability to identify and develop effective coping behaviors will improve, Ability to maintain clinical measurements within normal limits will improve, Compliance with prescribed medications will improve and Ability to identify triggers associated with substance abuse/mental health issues will improve  I certify that inpatient services furnished can reasonably be expected to improve the patient's condition.    [name],  MD 4/7/20223:33 PM

## 2020-11-02 MED ORDER — ONDANSETRON HCL 4 MG PO TABS
8.0000 mg | ORAL_TABLET | Freq: Three times a day (TID) | ORAL | Status: DC | PRN
  Administered 2020-11-04 – 2020-11-05 (×2): 8 mg via ORAL
  Filled 2020-11-02 (×3): qty 2

## 2020-11-02 MED ORDER — ENSURE ENLIVE PO LIQD
237.0000 mL | Freq: Two times a day (BID) | ORAL | Status: DC
  Administered 2020-11-03 – 2020-11-09 (×9): 237 mL via ORAL

## 2020-11-02 NOTE — Progress Notes (Signed)
[facility] MD Progress Note  11/02/2020 12:57 PM Lee Sparks  MRN:  [id number]  CC "I don't feel good."  Subjective:  Patient is a 50 year old male with alcohol use disorder and major depressive disorder presenting for suicidal ideations with plan to drink himself to death. No acute events overnight, medication compliant. This morning CIWA score elevated this morning and afternoon. He has required 3mg  of Ativan thus far per protocol. On interview he reports anxiety, headaches, sweats, tremors, and nausea with dry heaving. He felt too ill to participate in treatment team. He continues to feel suicidal, but contracts for safety in the hospital. He denies any homicidal ideations, auditory hallucinations, or visual hallucinations. He was encouraged to drink water and Gatorade while awake.   Principal Problem: Severe recurrent major depression without psychotic features (HCC) Diagnosis: Principal Problem:   Severe recurrent major depression without psychotic features (HCC) Active Problems:   Hypertension   Alcohol withdrawal syndrome (HCC)   Severe alcohol use disorder (HCC)  Total Time spent with patient: 20 minutes  Past Psychiatric History: See H&P  Past Medical History:  Past Medical History:  Diagnosis Date  . Alcohol-induced pancreatitis   . Anemia   . Bronchitis     Past Surgical History:  Procedure Laterality Date  . CYSTECTOMY    . ESOPHAGOGASTRODUODENOSCOPY  07/28/2011   Procedure: ESOPHAGOGASTRODUODENOSCOPY (EGD);  Surgeon: 07/30/2011, MD;  Location: [facility] ENDOSCOPY;  Service: Endoscopy;  Laterality: N/A;  . HERNIA REPAIR     Family History:  Family History  Problem Relation Age of Onset  . Hypertension Father   . Aneurysm Father   . Cancer Other   . Hypertension Mother   . Pneumonia Mother    Family Psychiatric  History: See H&P Social History:  Social History   Substance and Sexual Activity  Alcohol Use Yes  . Alcohol/week: 50.0 standard drinks  . Types: 50  Cans of beer per week   Comment: Equivalent of two 40 oz beers daily.     Social History   Substance and Sexual Activity  Drug Use No    Social History   Socioeconomic History  . Marital status: Single    Spouse name: Not on file  . Number of children: Not on file  . Years of education: Not on file  . Highest education level: Not on file  Occupational History  . Not on file  Tobacco Use  . Smoking status: Current Every Day Smoker    Packs/day: 0.50    Years: 15.00    Pack years: 7.50    Types: Cigarettes    Last attempt to quit: 06/27/2011    Years since quitting: 9.3  . Smokeless tobacco: Never Used  Substance and Sexual Activity  . Alcohol use: Yes    Alcohol/week: 50.0 standard drinks    Types: 50 Cans of beer per week    Comment: Equivalent of two 40 oz beers daily.  . Drug use: No  . Sexual activity: Yes  Other Topics Concern  . Not on file  Social History Narrative  . Not on file   Social Determinants of Health   Financial Resource Strain: Not on file  Food Insecurity: Not on file  Transportation Needs: Not on file  Physical Activity: Not on file  Stress: Not on file  Social Connections: Not on file   Additional Social History:  Sleep: Fair  Appetite:  Poor  Current Medications: Current Facility-Administered Medications  Medication Dose Route Frequency Provider Last Rate Last Admin  . acetaminophen (TYLENOL) tablet 650 mg  650 mg Oral Q6H PRN [name], [name], MD   650 mg at 11/02/20 0811  . alum & mag hydroxide-simeth (MAALOX/MYLANTA) 200-200-20 MG/5ML suspension 30 mL  30 mL Oral Q4H PRN [name], [name], MD      . feeding supplement (ENSURE ENLIVE / ENSURE PLUS) liquid 237 mL  237 mL Oral BID BM [name], MD      . folic acid (FOLVITE) tablet 1 mg  1 mg Oral Daily [name], MD   1 mg at 11/02/20 4403  . LORazepam (ATIVAN) injection 0-4 mg  0-4 mg Intravenous Q6H [name], [name], MD       Or  .  LORazepam (ATIVAN) tablet 0-4 mg  0-4 mg Oral Q6H [name], [name], MD   2 mg at 11/02/20 1157  . LORazepam (ATIVAN) injection 0-4 mg  0-4 mg Intravenous Q12H [name], [name], MD       Or  . LORazepam (ATIVAN) tablet 0-4 mg  0-4 mg Oral Q12H [name], [name], MD      . magnesium hydroxide (MILK OF MAGNESIA) suspension 30 mL  30 mL Oral Daily PRN [name], [name], MD      . mirtazapine (REMERON) tablet 15 mg  15 mg Oral QHS [name], MD   15 mg at 11/01/20 2200  . multivitamin with minerals tablet 1 tablet  1 tablet Oral Daily [name], MD   1 tablet at 11/02/20 [phone]  . ondansetron (ZOFRAN) tablet 8 mg  8 mg Oral Q8H PRN [name], MD      . thiamine tablet 100 mg  100 mg Oral Daily [name], [name], MD   100 mg at 11/02/20 5956   Or  . thiamine (B-1) injection 100 mg  100 mg Intravenous Daily [name], [name], MD      . traZODone (DESYREL) tablet 100 mg  100 mg Oral QHS PRN [name], [name], MD        Lab Results: No results found for this or any previous visit (from the past 48 hour(s)).  Blood Alcohol level:  Lab Results  Component Value Date   ETH [facility]) 10/30/2020   ETH <10 07/31/2020    Metabolic Disorder Labs: Lab Results  Component Value Date   HGBA1C 5.7 (H) 05/30/2020   No results found for: PROLACTIN Lab Results  Component Value Date   CHOL 206 (H) 05/30/2020   TRIG 187 (H) 05/30/2020   HDL 28 (L) 05/30/2020   CHOLHDL 7.4 (H) 05/30/2020   LDLCALC 144 (H) 05/30/2020   LDLCALC 98 06/28/2013    Physical Findings: AIMS:  , ,  ,  ,    CIWA:  CIWA-Ar Total: 11 COWS:     Musculoskeletal: Strength & Muscle Tone: within normal limits Gait & Station: unsteady Patient leans: Front  Psychiatric Specialty Exam:  Presentation  General Appearance: Disheveled  Eye Contact:Minimal  Speech:Slow  Speech Volume:Decreased  Handedness:Right   Mood and Affect  Mood:Depressed; Dysphoric  Affect:Congruent   Thought Process  Thought  Processes:Goal Directed  Descriptions of Associations:Intact  Orientation:Full (Time, Place and Person)  Thought Content:Rumination  History of Schizophrenia/Schizoaffective disorder:No  Duration of Psychotic Symptoms:No data recorded Hallucinations:Hallucinations: Other (comment)  Ideas of Reference:None  Suicidal Thoughts:Suicidal Thoughts: Yes, Active SI Active Intent and/or Plan:  Without Access to Means  Homicidal Thoughts:Homicidal Thoughts: No   Sensorium  Memory:Immediate Fair; Recent Fair; Remote Fair  Judgment:Impaired  Insight:Present   Executive Functions  Concentration:Poor  Attention Span:Poor  Recall:Fair  Fund of Knowledge:Fair  Language:Fair   Psychomotor Activity  Psychomotor Activity:Psychomotor Activity: Tremor   Assets  Assets:Desire for Improvement; Resilience   Sleep  Sleep:Sleep: Fair Number of Hours of Sleep: 7.5    Physical Exam: Physical Exam ROS Blood pressure (!) 143/106, pulse (!) 116, temperature 98.3 F (36.8 C), resp. rate 18, height 5\' 9"  (1.753 m), weight 81.6 kg, SpO2 98 %. Body mass index is 26.58 kg/m.   Treatment Plan Summary: Daily contact with patient to assess and evaluate symptoms and progress in treatment and Medication management   1)MDD, recurrent, severe without psychotic features- unstable. Continues to endorse suicidal ideations with plan to drink himself to death. Sees no reason to keep living - Continue Remeron 15 mg QHS  2) Alcohol Use Disorder, severe with acute alcohol withdrawals - CIWA protocol in place - MVI, thiamine, folic acid - Will discuss FDA approved medications for alcohol use disorder during admission once out of acute withdrawals   , MD 11/02/2020, 12:57 PM

## 2020-11-02 NOTE — Progress Notes (Signed)
Recreation Therapy Notes  INPATIENT RECREATION TR PLAN  Patient Details Name: Lee Sparks MRN: [id number] DOB: 1971/05/28 Today's Date: 11/02/2020  Rec Therapy Plan Is patient appropriate for Therapeutic Recreation?: Yes Treatment times per week: at least 3 Estimated Length of Stay: 5-7 days TR Treatment/Interventions: Group participation (Comment)  Discharge Criteria Pt will be discharged from therapy if:: Discharged Treatment plan/goals/alternatives discussed and agreed upon by:: Patient/family  Discharge Summary     Ramey Ketcherside 11/02/2020, 3:01 PM

## 2020-11-02 NOTE — Progress Notes (Signed)
Recreation Therapy Notes  INPATIENT RECREATION THERAPY ASSESSMENT  Patient Details Name: Lee Sparks MRN: [id number] DOB: 1971/01/23 Today's Date: 11/02/2020       Information Obtained From: Patient (Patient refused)  Able to Participate in Assessment/Interview:    Patient Presentation:    Reason for Admission (Per Patient):    Patient Stressors:    Coping Skills:      Leisure Interests (2+):     Frequency of Recreation/Participation:    Awareness of Community Resources:     [employer]:     Current Use:    If no, Barriers?:    Expressed Interest in [employer] Information:    [place] of Residence:     Patient Main Form of Transportation:    Patient Strengths:     Patient Identified Areas of Improvement:     Patient Goal for Hospitalization:     Current SI (including self-harm):     Current HI:     Current AVH:    Staff Intervention Plan:    Consent to Intern Participation:    Thora Scherman 11/02/2020, 3:00 PM

## 2020-11-02 NOTE — [facility] Group Notes (Signed)
LCSW Group Therapy Note  11/02/2020 1:51 PM  Type of Therapy and Topic:  Group Therapy:  Feelings around Relapse and Recovery  Participation Level:  Did Not Attend   Description of Group:    Patients in this group will discuss emotions they experience before and after a relapse. They will process how experiencing these feelings, or avoidance of experiencing them, relates to having a relapse. Facilitator will guide patients to explore emotions they have related to recovery. Patients will be encouraged to process which emotions are more powerful. They will be guided to discuss the emotional reaction significant others in their lives may have to their relapse or recovery. Patients will be assisted in exploring ways to respond to the emotions of others without this contributing to a relapse.  Therapeutic Goals: 1. Patient will identify two or more emotions that lead to a relapse for them 2. Patient will identify two emotions that result when they relapse 3. Patient will identify two emotions related to recovery 4. Patient will demonstrate ability to communicate their needs through discussion and/or role plays   Summary of Patient Progress: Patient did not attend group despite encouraged participation.    Therapeutic Modalities:   Cognitive Behavioral Therapy Solution-Focused Therapy Assertiveness Training Relapse Prevention Therapy   Gwenevere Ghazi, MSW, [place], [place] 11/02/2020 1:51 PM

## 2020-11-02 NOTE — [facility] Suicide Risk Assessment (Signed)
[facility] INPATIENT:  Family/Significant Other Suicide Prevention Education  Suicide Prevention Education:  Patient Refusal for Family/Significant Other Suicide Prevention Education: The patient Lee Sparks has refused to provide written consent for family/significant other to be provided Family/Significant Other Suicide Prevention Education during admission and/or prior to discharge.  Physician notified.  Corky Crafts 11/02/2020, 4:32 PM

## 2020-11-02 NOTE — Progress Notes (Signed)
Patient calm and cooperative during assessment. Pt endorses anxiety stating its from his withdrawal symptoms. Pt isolative to his room stating to this writer that he just wants to sleep. Patient given education, support, and encouragement to be active in his treatment plan. Pt being monitored Q 15 minutes for safety per unit protocol. Pt remains safe on the unit.

## 2020-11-02 NOTE — Progress Notes (Signed)
NUTRITION ASSESSMENT  Pt identified as at risk on the Malnutrition Screen Tool  INTERVENTION:  1. Supplements: Ensure Enlive po BID, each supplement provides 350 kcal and 20 grams of protein; MVI with minerals daily   NUTRITION DIAGNOSIS: Unintentional weight loss related to sub-optimal intake as evidenced by pt report.   Goal: Pt to meet >/= 90% of their estimated nutrition needs.  Monitor:  PO intake  Assessment:   Patient is a 50 year old male with alcohol use disorder and major depressive disorder presenting for suicidal ideations with plan to drink himself to death  Pt admitted with severe recurrent major depression without psychotic features.   Per RN notes, pt is withdrawn. He is homeless and reports drinking 15 beers per day. RD suspects diet of poor nutritional quality and possible food insecurity PTA.   Pt currently on a regular diet. No meal completions documented. Pt would greatly benefit from addition of oral nutrition supplements.   Reviewed wt hx; UBW is around 179#. Per wt hx, pt has experienced a 6.4% wt loss over the past 6 months, however, suspect wt of 87.2 kg on 05/10/21 is an outlier. Pt is now within UBW.   Medications reviewed and include folvite, ativan, remeron, and thiamine.  Labs reviewed.   50 y.o. male  Height: Ht Readings from Last 1 Encounters:  11/01/20 5\' 9"  (1.753 m)    Weight: Wt Readings from Last 1 Encounters:  11/01/20 81.6 kg    Weight Hx: Wt Readings from Last 10 Encounters:  11/01/20 81.6 kg  07/31/20 81.6 kg  07/19/20 81.6 kg  07/16/20 83.9 kg  05/18/20 83.9 kg  05/10/20 87.2 kg  04/23/20 81.6 kg  08/20/17 81.2 kg  07/31/17 79.4 kg  06/17/17 80.7 kg    BMI:  Body mass index is 26.58 kg/m. Pt meets criteria for normal weight range based on current BMI.  Estimated Nutritional Needs: Kcal: 25-30 kcal/kg Protein: > 1 gram protein/kg Fluid: 1 ml/kcal  Diet Order:  Diet Order            Diet regular Room  service appropriate? Yes; Fluid consistency: Thin  Diet effective now                Pt is also offered choice of unit snacks mid-morning and mid-afternoon.  Pt is eating as desired.   Lab results and medications reviewed.   06/19/17, RD, LDN, CDCES Registered Dietitian II Certified Diabetes Care and Education Specialist Please refer to [facility] for RD and/or RD on-call/weekend/after hours pager

## 2020-11-02 NOTE — Progress Notes (Addendum)
Recreation Therapy Notes    Date: 11/02/2020   Time: 2:00pm   Location: Courtyard     Behavioral response: N/A   Intervention Topic: Strengths    Discussion/Intervention: Patient did not attend group.   Clinical Observations/Feedback:  Patient did not attend group.   Loanne Emery LRT/CTRS        Tashari Schoenfelder 11/02/2020 2:25 PM

## 2020-11-02 NOTE — Progress Notes (Signed)
Pt has passive SI with no plan but contracts for safety. Pt denies HI. Torrie Mayers RN

## 2020-11-02 NOTE — Progress Notes (Signed)
Pt has remained isolative and withdrawn in his room except for meals. Pt has been receiving PRNs in accordance with his CIWA scale. Pt is calm, cooperative and med compliant. Pt has a sullen , grimaced affect. Pt is safe. Torrie Mayers RN

## 2020-11-02 NOTE — Tx Team (Addendum)
Interdisciplinary Treatment and Diagnostic Plan Update  11/02/2020 Time of Session: 0900 Lee Sparks MRN: [id number]  Principal Diagnosis: Severe recurrent major depression without psychotic features [facility])  Secondary Diagnoses: Principal Problem:   Severe recurrent major depression without psychotic features (HCC) Active Problems:   Hypertension   Alcohol withdrawal syndrome (HCC)   Severe alcohol use disorder (HCC)   Current Medications:  Current Facility-Administered Medications  Medication Dose Route Frequency Provider Last Rate Last Admin  . acetaminophen (TYLENOL) tablet 650 mg  650 mg Oral Q6H PRN [name], [name], MD   650 mg at 11/02/20 0811  . alum & mag hydroxide-simeth (MAALOX/MYLANTA) 200-200-20 MG/5ML suspension 30 mL  30 mL Oral Q4H PRN [name], [name], MD      . feeding supplement (ENSURE ENLIVE / ENSURE PLUS) liquid 237 mL  237 mL Oral BID BM [name], MD      . folic acid (FOLVITE) tablet 1 mg  1 mg Oral Daily [name], MD   1 mg at 11/02/20 8469  . LORazepam (ATIVAN) injection 0-4 mg  0-4 mg Intravenous Q6H [name], [name], MD       Or  . LORazepam (ATIVAN) tablet 0-4 mg  0-4 mg Oral Q6H [name], [name], MD   1 mg at 11/02/20 0858  . LORazepam (ATIVAN) injection 0-4 mg  0-4 mg Intravenous Q12H [name], [name], MD       Or  . LORazepam (ATIVAN) tablet 0-4 mg  0-4 mg Oral Q12H [name], [name], MD      . magnesium hydroxide (MILK OF MAGNESIA) suspension 30 mL  30 mL Oral Daily PRN [name], [name], MD      . mirtazapine (REMERON) tablet 15 mg  15 mg Oral QHS [name], MD   15 mg at 11/01/20 2200  . multivitamin with minerals tablet 1 tablet  1 tablet Oral Daily [name], MD   1 tablet at 11/02/20 [phone]  . thiamine tablet 100 mg  100 mg Oral Daily [name], [name], MD   100 mg at 11/02/20 0810   Or  . thiamine (B-1) injection 100 mg  100 mg Intravenous Daily [name], [name], MD      . traZODone (DESYREL) tablet 100 mg  100 mg Oral QHS  PRN [name], [name], MD       PTA Medications: Medications Prior to Admission  Medication Sig Dispense Refill Last Dose  . acetaminophen (TYLENOL) 500 MG tablet Take 2 tablets (1,000 mg total) by mouth every 12 (twelve) hours as needed for moderate pain. 30 tablet 0   . albuterol (PROVENTIL HFA;VENTOLIN HFA) 108 (90 Base) MCG/ACT inhaler Inhale 2 puffs into the lungs every 4 (four) hours as needed for wheezing or shortness of breath. (Patient not taking: Reported on 04/23/2020) 1 Inhaler 1   . cetirizine (ZYRTEC) 5 MG tablet Take 1 tablet (5 mg total) by mouth daily. 30 tablet 0   . lidocaine (LIDODERM) 5 % Place 1 patch onto the skin every 12 (twelve) hours. Remove & Discard patch within 12 hours or as directed by MD 10 patch 0   . naproxen (NAPROSYN) 500 MG tablet Take 1 tablet (500 mg total) by mouth 2 (two) times daily with a meal. 20 tablet 00     Patient Stressors: Financial difficulties Marital or family conflict Other: homelessness  Patient Strengths: Average or above average intelligence Communication skills  Treatment Modalities: Medication Management, Group therapy, Case management,  1  to 1 session with clinician, Psychoeducation, Recreational therapy.   Physician Treatment Plan for Primary Diagnosis: Severe recurrent major depression without psychotic features (HCC) Long Term Goal(s): Improvement in symptoms so as ready for discharge Improvement in symptoms so as ready for discharge   Short Term Goals: Ability to identify changes in lifestyle to reduce recurrence of condition will improve Ability to verbalize feelings will improve Ability to disclose and discuss suicidal ideas Ability to demonstrate self-control will improve Ability to identify and develop effective coping behaviors will improve Ability to maintain clinical measurements within normal limits will improve Compliance with prescribed medications will improve Ability to identify triggers associated with  substance abuse/mental health issues will improve Ability to identify changes in lifestyle to reduce recurrence of condition will improve Ability to verbalize feelings will improve Ability to disclose and discuss suicidal ideas Ability to demonstrate self-control will improve Ability to identify and develop effective coping behaviors will improve Ability to maintain clinical measurements within normal limits will improve Compliance with prescribed medications will improve Ability to identify triggers associated with substance abuse/mental health issues will improve  Medication Management: Evaluate patient's response, side effects, and tolerance of medication regimen.  Therapeutic Interventions: 1 to 1 sessions, Unit Group sessions and Medication administration.  Evaluation of Outcomes: Not Progressing  Physician Treatment Plan for Secondary Diagnosis: Principal Problem:   Severe recurrent major depression without psychotic features (HCC) Active Problems:   Hypertension   Alcohol withdrawal syndrome (HCC)   Severe alcohol use disorder (HCC)  Long Term Goal(s): Improvement in symptoms so as ready for discharge Improvement in symptoms so as ready for discharge   Short Term Goals: Ability to identify changes in lifestyle to reduce recurrence of condition will improve Ability to verbalize feelings will improve Ability to disclose and discuss suicidal ideas Ability to demonstrate self-control will improve Ability to identify and develop effective coping behaviors will improve Ability to maintain clinical measurements within normal limits will improve Compliance with prescribed medications will improve Ability to identify triggers associated with substance abuse/mental health issues will improve Ability to identify changes in lifestyle to reduce recurrence of condition will improve Ability to verbalize feelings will improve Ability to disclose and discuss suicidal ideas Ability to  demonstrate self-control will improve Ability to identify and develop effective coping behaviors will improve Ability to maintain clinical measurements within normal limits will improve Compliance with prescribed medications will improve Ability to identify triggers associated with substance abuse/mental health issues will improve     Medication Management: Evaluate patient's response, side effects, and tolerance of medication regimen.  Therapeutic Interventions: 1 to 1 sessions, Unit Group sessions and Medication administration.  Evaluation of Outcomes: Not Progressing   RN Treatment Plan for Primary Diagnosis: Severe recurrent major depression without psychotic features (HCC) Long Term Goal(s): Knowledge of disease and therapeutic regimen to maintain health will improve  Short Term Goals: Ability to verbalize frustration and anger appropriately will improve, Ability to participate in decision making will improve, Ability to verbalize feelings will improve, Ability to identify and develop effective coping behaviors will improve and Compliance with prescribed medications will improve  Medication Management: RN will administer medications as ordered by provider, will assess and evaluate patient's response and provide education to patient for prescribed medication. RN will report any adverse and/or side effects to prescribing provider.  Therapeutic Interventions: 1 on 1 counseling sessions, Psychoeducation, Medication administration, Evaluate responses to treatment, Monitor vital signs and CBGs as ordered, Perform/monitor CIWA, COWS, AIMS and Fall Risk screenings as  ordered, Perform wound care treatments as ordered.  Evaluation of Outcomes: Progressing   LCSW Treatment Plan for Primary Diagnosis: Severe recurrent major depression without psychotic features (HCC) Long Term Goal(s): Safe transition to appropriate next level of care at discharge, Engage patient in therapeutic group addressing  interpersonal concerns.  Short Term Goals: Engage patient in aftercare planning with referrals and resources, Increase social support, Increase ability to appropriately verbalize feelings, Increase emotional regulation, Facilitate acceptance of mental health diagnosis and concerns, Facilitate patient progression through stages of change regarding substance use diagnoses and concerns, Identify triggers associated with mental health/substance abuse issues and Increase skills for wellness and recovery  Therapeutic Interventions: Assess for all discharge needs, 1 to 1 time with Social worker, Explore available resources and support systems, Assess for adequacy in community support network, Educate family and significant other(s) on suicide prevention, Complete Psychosocial Assessment, Interpersonal group therapy.  Evaluation of Outcomes: Not Progressing   Progress in Treatment: Attending groups: No. Participating in groups: No. Taking medication as prescribed: Yes. Toleration medication: Yes. Family/Significant other contact made: No, will contact:  CSW will obtain consent to contact collateral Patient understands diagnosis: Yes. Discussing patient identified problems/goals with staff: Yes. Medical problems stabilized or resolved: No. Denies suicidal/homicidal ideation: No. Issues/concerns per patient self-inventory: Yes. Other:   New problem(s) identified: Yes, Describe:  Alcohol detox and Suicidal Idiation  New Short Term/Long Term Goal(s): detox, elimination of symptoms of psychosis, medication management for mood stabilization; elimination of SI thoughts; development of comprehensive mental wellness/sobriety plan.  Patient Goals:  Patient unable to attend due to grogginess and fatigue. CSW will meet with patient at bedside.   Discharge Plan or Barriers: none reported at this time.   Reason for Continuation of Hospitalization: Medication stabilization Suicidal ideation Withdrawal  symptoms  Estimated Length of Stay: 2-7 days  Recreational Therapy: Patient Stressors: N/A Patient Goal: Patient will engage in groups without prompting or encouragement from LRT x3 group sessions within 5 recreation therapy group sessions   Attendees: Patient: Lee Sparks 11/02/2020 9:47 AM  Physician: Les Pou, MD 11/02/2020 9:47 AM  Nursing: Torrie Mayers, RN  11/02/2020 9:47 AM  RN Care Manager: 11/02/2020 9:47 AM  Social Worker: Gwenevere Ghazi, MSW, [place], LCASA  11/02/2020 9:47 AM  Recreational Therapist: Hilbert Bible, LRT  11/02/2020 9:47 AM  Other: Kiva [place], LCSWA 11/02/2020 9:47 AM  Other: Jillyn Hidden, MSW, LCSW, LCAS  11/02/2020 9:47 AM  Other: 11/02/2020 9:47 AM    Scribe for Treatment Team: Corky Crafts, LCSWA 11/02/2020 9:47 AM
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
## 2020-11-02 NOTE — Plan of Care (Signed)
Pt was educated on care plan and verbalizes understanding. Torrie Mayers RN Problem: Education: Goal: Utilization of techniques to improve thought processes will improve Outcome: Progressing Goal: Knowledge of the prescribed therapeutic regimen will improve Outcome: Progressing   Problem: Activity: Goal: Interest or engagement in leisure activities will improve Outcome: Progressing Goal: Imbalance in normal sleep/wake cycle will improve Outcome: Progressing   Problem: Coping: Goal: Coping ability will improve Outcome: Progressing Goal: Will verbalize feelings Outcome: Progressing   Problem: Health Behavior/Discharge Planning: Goal: Ability to make decisions will improve Outcome: Progressing Goal: Compliance with therapeutic regimen will improve Outcome: Progressing   Problem: Role Relationship: Goal: Will demonstrate positive changes in social behaviors and relationships Outcome: Progressing   Problem: Safety: Goal: Ability to disclose and discuss suicidal ideas will improve Outcome: Progressing Goal: Ability to identify and utilize support systems that promote safety will improve Outcome: Progressing   Problem: Self-Concept: Goal: Will verbalize positive feelings about self Outcome: Progressing Goal: Level of anxiety will decrease Outcome: Progressing   Problem: Education: Goal: Knowledge of [facility] Education information/materials will improve Outcome: Progressing Goal: Emotional status will improve Outcome: Progressing Goal: Mental status will improve Outcome: Progressing Goal: Verbalization of understanding the information provided will improve Outcome: Progressing   Problem: Activity: Goal: Interest or engagement in activities will improve Outcome: Progressing Goal: Sleeping patterns will improve Outcome: Progressing   Problem: Coping: Goal: Ability to verbalize frustrations and anger appropriately will improve Outcome: Progressing Goal:  Ability to demonstrate self-control will improve Outcome: Progressing   Problem: Health Behavior/Discharge Planning: Goal: Identification of resources available to assist in meeting health care needs will improve Outcome: Progressing Goal: Compliance with treatment plan for underlying cause of condition will improve Outcome: Progressing   Problem: Physical Regulation: Goal: Ability to maintain clinical measurements within normal limits will improve Outcome: Progressing   Problem: Safety: Goal: Periods of time without injury will increase Outcome: Progressing   Problem: Education: Goal: Ability to state activities that reduce stress will improve Outcome: Progressing   Problem: Coping: Goal: Ability to identify and develop effective coping behavior will improve Outcome: Progressing   Problem: Self-Concept: Goal: Ability to identify factors that promote anxiety will improve Outcome: Progressing Goal: Level of anxiety will decrease Outcome: Progressing Goal: Ability to modify response to factors that promote anxiety will improve Outcome: Progressing   Problem: Education: Goal: Ability to make informed decisions regarding treatment will improve Outcome: Progressing   Problem: Coping: Goal: Coping ability will improve Outcome: Progressing   Problem: Health Behavior/Discharge Planning: Goal: Identification of resources available to assist in meeting health care needs will improve Outcome: Progressing   Problem: Medication: Goal: Compliance with prescribed medication regimen will improve Outcome: Progressing   Problem: Self-Concept: Goal: Ability to disclose and discuss suicidal ideas will improve Outcome: Progressing Goal: Will verbalize positive feelings about self Outcome: Progressing

## 2020-11-02 NOTE — [facility] Counselor (Signed)
Patient refused consent for CSW to contact collateral or arrange aftercare at this time. CSW to follow up at a later time.   Signed:  Corky Crafts, MSW, [place], LCASA 11/02/2020 4:31 PM

## 2020-11-02 NOTE — [facility] Counselor (Signed)
CSW met with patient at bedside in order to complete psychosocial assessment. Patient unwilling at this time. CSW will make an additional attempt later in day.   Signed:  Durenda Hurt, MSW, [name], LCASA 11/02/2020 2:29 PM

## 2020-11-03 MED ORDER — LORAZEPAM 2 MG/ML IJ SOLN: 0.0000 mg | Freq: Four times a day (QID) | INTRAMUSCULAR | Status: DC

## 2020-11-03 MED ORDER — MIRTAZAPINE 15 MG PO TABS
30.0000 mg | ORAL_TABLET | Freq: Every day | ORAL | Status: DC
  Administered 2020-11-03 – 2020-11-05 (×3): 30 mg via ORAL
  Filled 2020-11-03 (×3): qty 2

## 2020-11-03 MED ORDER — LORAZEPAM 2 MG PO TABS
0.0000 mg | ORAL_TABLET | Freq: Four times a day (QID) | ORAL | Status: DC
  Administered 2020-11-03 – 2020-11-04 (×3): 2 mg via ORAL
  Filled 2020-11-03 (×3): qty 1

## 2020-11-03 NOTE — Progress Notes (Signed)
[facility] MD Progress Note  11/03/2020 11:15 AM Lee Sparks  MRN:  [id number]  CC "Still the same."  Subjective:  Patient is a 50 year old male with alcohol use disorder and major depressive disorder presenting for suicidal ideations with plan to drink himself to death. No acute events overnight, medication compliant. This morning CIWA score elevated at 13 requiring PRN Ativan. On interview he continues to endorse headache, nausea, diarrhea, night sweats, and shakes. He also continues to endorse suicidal ideations, but contracts for safety in the hospital. He denies any homicidal ideations, visual hallucinations, or auditory hallucinations. No side effects to medications. Appetite slowly improving. Will increase Remeron to 30 mg QHS.    Principal Problem: Severe recurrent major depression without psychotic features (HCC) Diagnosis: Principal Problem:   Severe recurrent major depression without psychotic features (HCC) Active Problems:   Hypertension   Alcohol withdrawal syndrome (HCC)   Severe alcohol use disorder (HCC)  Total Time spent with patient: 20 minutes  Past Psychiatric History: See H&P  Past Medical History:  Past Medical History:  Diagnosis Date  . Alcohol-induced pancreatitis   . Anemia   . Bronchitis     Past Surgical History:  Procedure Laterality Date  . CYSTECTOMY    . ESOPHAGOGASTRODUODENOSCOPY  07/28/2011   Procedure: ESOPHAGOGASTRODUODENOSCOPY (EGD);  Surgeon: Freddy Jaksch, MD;  Location: [facility] ENDOSCOPY;  Service: Endoscopy;  Laterality: N/A;  . HERNIA REPAIR     Family History:  Family History  Problem Relation Age of Onset  . Hypertension Father   . Aneurysm Father   . Cancer Other   . Hypertension Mother   . Pneumonia Mother    Family Psychiatric  History: See H&P Social History:  Social History   Substance and Sexual Activity  Alcohol Use Yes  . Alcohol/week: 50.0 standard drinks  . Types: 50 Cans of beer per week   Comment: Equivalent of two  40 oz beers daily.     Social History   Substance and Sexual Activity  Drug Use No    Social History   Socioeconomic History  . Marital status: Single    Spouse name: Not on file  . Number of children: Not on file  . Years of education: Not on file  . Highest education level: Not on file  Occupational History  . Not on file  Tobacco Use  . Smoking status: Current Every Day Smoker    Packs/day: 0.50    Years: 15.00    Pack years: 7.50    Types: Cigarettes    Last attempt to quit: 06/27/2011    Years since quitting: 9.3  . Smokeless tobacco: Never Used  Substance and Sexual Activity  . Alcohol use: Yes    Alcohol/week: 50.0 standard drinks    Types: 50 Cans of beer per week    Comment: Equivalent of two 40 oz beers daily.  . Drug use: No  . Sexual activity: Yes  Other Topics Concern  . Not on file  Social History Narrative  . Not on file   Social Determinants of Health   Financial Resource Strain: Not on file  Food Insecurity: Not on file  Transportation Needs: Not on file  Physical Activity: Not on file  Stress: Not on file  Social Connections: Not on file   Additional Social History:                         Sleep: Fair  Appetite:  Poor  Current  Medications: Current Facility-Administered Medications  Medication Dose Route Frequency Provider Last Rate Last Admin  . acetaminophen (TYLENOL) tablet 650 mg  650 mg Oral Q6H PRN [name], [name], MD   650 mg at 11/03/20 0853  . alum & mag hydroxide-simeth (MAALOX/MYLANTA) 200-200-20 MG/5ML suspension 30 mL  30 mL Oral Q4H PRN [name], [name], MD      . feeding supplement (ENSURE ENLIVE / ENSURE PLUS) liquid 237 mL  237 mL Oral BID BM [name], MD      . folic acid (FOLVITE) tablet 1 mg  1 mg Oral Daily [name], MD   1 mg at 11/03/20 0848  . LORazepam (ATIVAN) injection 0-4 mg  0-4 mg Intravenous Q12H [name], [name], MD       Or  . LORazepam (ATIVAN) tablet 0-4 mg  0-4 mg Oral Q12H  [name], [name], MD   2 mg at 11/03/20 0851  . LORazepam (ATIVAN) injection 0-4 mg  0-4 mg Intravenous Q6H [name], MD       Or  . LORazepam (ATIVAN) tablet 0-4 mg  0-4 mg Oral Q6H [name], MD      . magnesium hydroxide (MILK OF MAGNESIA) suspension 30 mL  30 mL Oral Daily PRN [name], [name], MD      . mirtazapine (REMERON) tablet 30 mg  30 mg Oral QHS [name], MD      . multivitamin with minerals tablet 1 tablet  1 tablet Oral Daily [name], MD   1 tablet at 11/03/20 0848  . ondansetron (ZOFRAN) tablet 8 mg  8 mg Oral Q8H PRN [name], MD      . thiamine tablet 100 mg  100 mg Oral Daily [name], [name], MD   100 mg at 11/03/20 0848   Or  . thiamine (B-1) injection 100 mg  100 mg Intravenous Daily [name], [name], MD      . traZODone (DESYREL) tablet 100 mg  100 mg Oral QHS PRN [name], [name], MD        Lab Results: No results found for this or any previous visit (from the past 48 hour(s)).  Blood Alcohol level:  Lab Results  Component Value Date   ETH 341 [facility]) 10/30/2020   ETH <10 07/31/2020    Metabolic Disorder Labs: Lab Results  Component Value Date   HGBA1C 5.7 (H) 05/30/2020   No results found for: PROLACTIN Lab Results  Component Value Date   CHOL 206 (H) 05/30/2020   TRIG 187 (H) 05/30/2020   HDL 28 (L) 05/30/2020   CHOLHDL 7.4 (H) 05/30/2020   LDLCALC 144 (H) 05/30/2020   LDLCALC 98 06/28/2013    Physical Findings: AIMS:  , ,  ,  ,    CIWA:  CIWA-Ar Total: 13 COWS:     Musculoskeletal: Strength & Muscle Tone: within normal limits Gait & Station: unsteady Patient leans: Front  Psychiatric Specialty Exam:  Presentation  General Appearance: Disheveled  Eye Contact:Minimal  Speech:Slow  Speech Volume:Decreased  Handedness:Right   Mood and Affect  Mood:Depressed; Dysphoric  Affect:Congruent   Thought Process  Thought Processes:Goal Directed  Descriptions of Associations:Intact  Orientation:Full  (Time, Place and Person)  Thought Content:Rumination  History of Schizophrenia/Schizoaffective disorder:No  Duration of Psychotic Symptoms:No data recorded Hallucinations:Hallucinations: Other (comment)  Ideas of Reference:None  Suicidal Thoughts:Suicidal Thoughts: Yes, Active SI Active Intent and/or Plan: Without Access to Means  Homicidal Thoughts:Homicidal Thoughts: No  Sensorium  Memory:Immediate Fair; Recent Fair; Remote Fair  Judgment:Impaired  Insight:Present   Executive Functions  Concentration:Poor  Attention Span:Poor  Recall:Fair  Fund of Knowledge:Fair  Language:Fair   Psychomotor Activity  Psychomotor Activity:Psychomotor Activity: Tremor   Assets  Assets:Desire for Improvement; Resilience   Sleep  Sleep:Sleep: Fair Number of Hours of Sleep: 7.5    Physical Exam: Physical Exam  ROS  Blood pressure (!) 141/89, pulse 99, temperature 98.1 F (36.7 C), temperature source Oral, resp. rate 18, height 5\' 9"  (1.753 m), weight 81.6 kg, SpO2 95 %. Body mass index is 26.58 kg/m.   Treatment Plan Summary: Daily contact with patient to assess and evaluate symptoms and progress in treatment and Medication management   1)MDD, recurrent, severe without psychotic features- unstable. Continues to endorse suicidal ideations with plan to drink himself to death. Sees no reason to keep living - Increase Remeron 30 mg QHS  2) Alcohol Use Disorder, severe with acute alcohol withdrawals - CIWA protocol in place - MVI, thiamine, folic acid - Will discuss FDA approved medications for alcohol use disorder during admission once out of acute withdrawals   11/03/20: Psychiatric exam above reviewed and remains accurate. Assessment and plan above reviewed and updated.    01/03/21, MD 11/03/2020, 11:15 AM

## 2020-11-03 NOTE — Progress Notes (Signed)
Patient has been in bed all shift, sleeping. No complaints of withdrawal symptoms. HS meds held due to sedation

## 2020-11-03 NOTE — Progress Notes (Addendum)
Pt is alert and oriented to person, place, time and situation. Pt has been calm, cooperative, denies suicidal and homicidal ideation, reports feelings of depression and anxiety, rates them both 8/10 on a 0-10 scale, 10 being worst. Pt reports withdrawal symptoms, score positive on CIWA for withdrawal symptoms requiring Ativan, and Tylenol for c/o headache, which was given, and was effective, see MAR for those details. Pt uses walker for ambulation independently. Pt spends time in the dayroom watching tv, and being social with peers and nursing students. Pt is medication compliant, appetite is fair, thoughts are logical. Will continue to monitor pt per Q15 minute face checks and monitor for safety and progress.

## 2020-11-03 NOTE — Plan of Care (Signed)
  Problem: Education: Goal: Utilization of techniques to improve thought processes will improve Outcome: Progressing Goal: Knowledge of the prescribed therapeutic regimen will improve Outcome: Progressing   Problem: Activity: Goal: Interest or engagement in leisure activities will improve Outcome: Progressing Goal: Imbalance in normal sleep/wake cycle will improve Outcome: Progressing   Problem: Coping: Goal: Coping ability will improve Outcome: Progressing Goal: Will verbalize feelings Outcome: Progressing   Problem: Health Behavior/Discharge Planning: Goal: Ability to make decisions will improve Outcome: Progressing Goal: Compliance with therapeutic regimen will improve Outcome: Progressing   Problem: Role Relationship: Goal: Will demonstrate positive changes in social behaviors and relationships Outcome: Progressing   Problem: Safety: Goal: Ability to disclose and discuss suicidal ideas will improve Outcome: Progressing Goal: Ability to identify and utilize support systems that promote safety will improve Outcome: Progressing   Problem: Self-Concept: Goal: Will verbalize positive feelings about self Outcome: Progressing Goal: Level of anxiety will decrease Outcome: Progressing   Problem: Education: Goal: Knowledge of [facility] Education information/materials will improve Outcome: Progressing Goal: Emotional status will improve Outcome: Progressing Goal: Mental status will improve Outcome: Progressing Goal: Verbalization of understanding the information provided will improve Outcome: Progressing   Problem: Activity: Goal: Interest or engagement in activities will improve Outcome: Progressing Goal: Sleeping patterns will improve Outcome: Progressing   Problem: Coping: Goal: Ability to verbalize frustrations and anger appropriately will improve Outcome: Progressing Goal: Ability to demonstrate self-control will improve Outcome: Progressing    Problem: Health Behavior/Discharge Planning: Goal: Identification of resources available to assist in meeting health care needs will improve Outcome: Progressing Goal: Compliance with treatment plan for underlying cause of condition will improve Outcome: Progressing   Problem: Physical Regulation: Goal: Ability to maintain clinical measurements within normal limits will improve Outcome: Progressing   Problem: Safety: Goal: Periods of time without injury will increase Outcome: Progressing   Problem: Education: Goal: Ability to state activities that reduce stress will improve Outcome: Progressing   Problem: Coping: Goal: Ability to identify and develop effective coping behavior will improve Outcome: Progressing   Problem: Self-Concept: Goal: Ability to identify factors that promote anxiety will improve Outcome: Progressing Goal: Level of anxiety will decrease Outcome: Progressing Goal: Ability to modify response to factors that promote anxiety will improve Outcome: Progressing   Problem: Education: Goal: Ability to make informed decisions regarding treatment will improve Outcome: Progressing   Problem: Coping: Goal: Coping ability will improve Outcome: Progressing   Problem: Health Behavior/Discharge Planning: Goal: Identification of resources available to assist in meeting health care needs will improve Outcome: Progressing   Problem: Medication: Goal: Compliance with prescribed medication regimen will improve Outcome: Progressing   Problem: Self-Concept: Goal: Ability to disclose and discuss suicidal ideas will improve Outcome: Progressing Goal: Will verbalize positive feelings about self Outcome: Progressing   

## 2020-11-04 MED ORDER — BUPROPION HCL ER (XL) 150 MG PO TB24
150.0000 mg | ORAL_TABLET | Freq: Every day | ORAL | Status: DC
  Administered 2020-11-04 – 2020-11-09 (×6): 150 mg via ORAL
  Filled 2020-11-04 (×7): qty 1

## 2020-11-04 NOTE — Progress Notes (Signed)
Pt is alert and oriented to person, place, time and situation. Pt is alert and oriented to person, place, time and situation. Pt's thoughts are logical. Pt is calm, cooperative, denies suicidal and homicidal ideation, denies hallucinations, affect is flat. No acute distress noted, none reported. Pt reports intermittent alcohol withdrawal symptoms, pt given medications for those, and they are effective. Will continue to monitor CIWAs, which have been positive for withdrawal symptoms. Pt spends time in the dayroom watching tv at times is out for meals, uses walker independently, gait is steady with walker. Will continue to monitor pt per Q15 minute face checks and monitor for safety and progress.

## 2020-11-04 NOTE — Progress Notes (Signed)
Patient has been out in the milieu more today, using his walker. No complaints of withdrawal. Seems mildly confused at times.

## 2020-11-04 NOTE — Progress Notes (Signed)
[facility] MD Progress Note  11/04/2020 1:31 PM Lee Sparks  MRN:  [id number]  CC "Still feel the same"  Subjective:  Patient is a 50 year old male with alcohol use disorder and major depressive disorder presenting for suicidal ideations with plan to drink himself to death. No acute events overnight, medication compliant. This morning patient continues to endorse severe anxiety and depression and desire to no longer live. However, he contracts for safety on the unit. He denies any homicidal ideations, visual hallucinations, or auditory hallucinations. He continues to endorse shakes, sweats, and nausea and was given Ativan per CIWA protocol. Objectively, patient looks better than on admission. He is not as toxic appearing, has been seen out in the dayroom more frequently, and has started to have improved oral intake.    Principal Problem: Severe recurrent major depression without psychotic features (HCC) Diagnosis: Principal Problem:   Severe recurrent major depression without psychotic features (HCC) Active Problems:   Hypertension   Alcohol withdrawal syndrome (HCC)   Severe alcohol use disorder (HCC)  Total Time spent with patient: 20 minutes  Past Psychiatric History: See H&P  Past Medical History:  Past Medical History:  Diagnosis Date  . Alcohol-induced pancreatitis   . Anemia   . Bronchitis     Past Surgical History:  Procedure Laterality Date  . CYSTECTOMY    . ESOPHAGOGASTRODUODENOSCOPY  07/28/2011   Procedure: ESOPHAGOGASTRODUODENOSCOPY (EGD);  Surgeon: Freddy Jaksch, MD;  Location: [facility] ENDOSCOPY;  Service: Endoscopy;  Laterality: N/A;  . HERNIA REPAIR     Family History:  Family History  Problem Relation Age of Onset  . Hypertension Father   . Aneurysm Father   . Cancer Other   . Hypertension Mother   . Pneumonia Mother    Family Psychiatric  History: See H&P Social History:  Social History   Substance and Sexual Activity  Alcohol Use Yes  . Alcohol/week:  50.0 standard drinks  . Types: 50 Cans of beer per week   Comment: Equivalent of two 40 oz beers daily.     Social History   Substance and Sexual Activity  Drug Use No    Social History   Socioeconomic History  . Marital status: Single    Spouse name: Not on file  . Number of children: Not on file  . Years of education: Not on file  . Highest education level: Not on file  Occupational History  . Not on file  Tobacco Use  . Smoking status: Current Every Day Smoker    Packs/day: 0.50    Years: 15.00    Pack years: 7.50    Types: Cigarettes    Last attempt to quit: 06/27/2011    Years since quitting: 9.3  . Smokeless tobacco: Never Used  Substance and Sexual Activity  . Alcohol use: Yes    Alcohol/week: 50.0 standard drinks    Types: 50 Cans of beer per week    Comment: Equivalent of two 40 oz beers daily.  . Drug use: No  . Sexual activity: Yes  Other Topics Concern  . Not on file  Social History Narrative  . Not on file   Social Determinants of Health   Financial Resource Strain: Not on file  Food Insecurity: Not on file  Transportation Needs: Not on file  Physical Activity: Not on file  Stress: Not on file  Social Connections: Not on file   Additional Social History:  Sleep: Fair  Appetite:  Poor  Current Medications: Current Facility-Administered Medications  Medication Dose Route Frequency Provider Last Rate Last Admin  . acetaminophen (TYLENOL) tablet 650 mg  650 mg Oral Q6H PRN [name], [name], MD   650 mg at 11/03/20 0853  . alum & mag hydroxide-simeth (MAALOX/MYLANTA) 200-200-20 MG/5ML suspension 30 mL  30 mL Oral Q4H PRN [name], [name], MD      . buPROPion (WELLBUTRIN XL) 24 hr tablet 150 mg  150 mg Oral Daily Les Pou M, MD      . feeding supplement (ENSURE ENLIVE / ENSURE PLUS) liquid 237 mL  237 mL Oral BID BM [name], MD   237 mL at 11/03/20 1402  . folic acid (FOLVITE) tablet 1 mg  1 mg Oral  Daily [name], MD   1 mg at 11/04/20 7001  . LORazepam (ATIVAN) injection 0-4 mg  0-4 mg Intravenous Q6H [name], MD       Or  . LORazepam (ATIVAN) tablet 0-4 mg  0-4 mg Oral Q6H [name], MD   2 mg at 11/04/20 1134  . magnesium hydroxide (MILK OF MAGNESIA) suspension 30 mL  30 mL Oral Daily PRN [name], [name], MD      . mirtazapine (REMERON) tablet 30 mg  30 mg Oral QHS [name], MD   30 mg at 11/03/20 2130  . multivitamin with minerals tablet 1 tablet  1 tablet Oral Daily [name], MD   1 tablet at 11/04/20 [phone]  . ondansetron (ZOFRAN) tablet 8 mg  8 mg Oral Q8H PRN [name], MD   8 mg at 11/04/20 1135  . thiamine tablet 100 mg  100 mg Oral Daily [name], [name], MD   100 mg at 11/04/20 4967   Or  . thiamine (B-1) injection 100 mg  100 mg Intravenous Daily [name], [name], MD      . traZODone (DESYREL) tablet 100 mg  100 mg Oral QHS PRN [name], [name], MD        Lab Results: No results found for this or any previous visit (from the past 48 hour(s)).  Blood Alcohol level:  Lab Results  Component Value Date   ETH 341 [facility]) 10/30/2020   ETH <10 07/31/2020    Metabolic Disorder Labs: Lab Results  Component Value Date   HGBA1C 5.7 (H) 05/30/2020   No results found for: PROLACTIN Lab Results  Component Value Date   CHOL 206 (H) 05/30/2020   TRIG 187 (H) 05/30/2020   HDL 28 (L) 05/30/2020   CHOLHDL 7.4 (H) 05/30/2020   LDLCALC 144 (H) 05/30/2020   LDLCALC 98 06/28/2013    Physical Findings: AIMS:  , ,  ,  ,    CIWA:  CIWA-Ar Total: 12 COWS:     Musculoskeletal: Strength & Muscle Tone: within normal limits Gait & Station: unsteady Patient leans: Front  Psychiatric Specialty Exam:  Presentation  General Appearance: Disheveled  Eye Contact:Fair Speech:Normal rate Speech Volume:Decreased  Handedness:Right   Mood and Affect  Mood:Depressed; Dysphoric  Affect:Congruent   Thought Process  Thought Processes:Goal  Directed  Descriptions of Associations:Intact  Orientation:Full (Time, Place and Person)  Thought Content:Rumination  History of Schizophrenia/Schizoaffective disorder:No  Duration of Psychotic Symptoms: Hallucinations:None Ideas of Reference:None  Suicidal Thoughts:Yes, passive without access to means Homicidal Thoughts:Denies  Sensorium  Memory:Immediate Fair; Recent Fair; Remote Fair  Judgment:Impaired  Insight:Present   Executive Functions  Concentration:Fair Attention Span:Fair  Recall:Fair  Fund of Knowledge:Fair  Language:Fair   Psychomotor Activity  Psychomotor Activity:Decreased  Assets  Assets:Desire for Improvement; Resilience   Sleep  Sleep:Fair, 8 hours   Physical Exam: Physical Exam  ROS  Blood pressure (!) 141/94, pulse 100, temperature 98.8 F (37.1 C), temperature source Oral, resp. rate 18, height 5\' 9"  (1.753 m), weight 81.6 kg, SpO2 99 %. Body mass index is 26.58 kg/m.   Treatment Plan Summary: Daily contact with patient to assess and evaluate symptoms and progress in treatment and Medication management   1)MDD, recurrent, severe without psychotic features- unstable. Continues to endorse suicidal ideations with plan to drink himself to death. Sees no reason to keep living - Continue Remeron 30 mg QHS - Start Wellbutrin XL 150 mg daily  2) Alcohol Use Disorder, severe with acute alcohol withdrawals - CIWA protocol in place - MVI, thiamine, folic acid - Will discuss FDA approved medications for alcohol use disorder during admission once out of acute withdrawals   11/04/20: Psychiatric exam above reviewed and remains accurate. Assessment and plan above reviewed and updated.     01/04/21, MD 11/04/2020, 1:31 PM

## 2020-11-04 NOTE — Plan of Care (Signed)
  Problem: Education: Goal: Utilization of techniques to improve thought processes will improve Outcome: Progressing Goal: Knowledge of the prescribed therapeutic regimen will improve Outcome: Progressing   Problem: Activity: Goal: Interest or engagement in leisure activities will improve Outcome: Progressing Goal: Imbalance in normal sleep/wake cycle will improve Outcome: Progressing   Problem: Coping: Goal: Coping ability will improve Outcome: Progressing Goal: Will verbalize feelings Outcome: Progressing   Problem: Health Behavior/Discharge Planning: Goal: Ability to make decisions will improve Outcome: Progressing Goal: Compliance with therapeutic regimen will improve Outcome: Progressing   Problem: Role Relationship: Goal: Will demonstrate positive changes in social behaviors and relationships Outcome: Progressing   Problem: Safety: Goal: Ability to disclose and discuss suicidal ideas will improve Outcome: Progressing Goal: Ability to identify and utilize support systems that promote safety will improve Outcome: Progressing   Problem: Self-Concept: Goal: Will verbalize positive feelings about self Outcome: Progressing Goal: Level of anxiety will decrease Outcome: Progressing   Problem: Education: Goal: Knowledge of [facility] Education information/materials will improve Outcome: Progressing Goal: Emotional status will improve Outcome: Progressing Goal: Mental status will improve Outcome: Progressing Goal: Verbalization of understanding the information provided will improve Outcome: Progressing   Problem: Activity: Goal: Interest or engagement in activities will improve Outcome: Progressing Goal: Sleeping patterns will improve Outcome: Progressing   Problem: Coping: Goal: Ability to verbalize frustrations and anger appropriately will improve Outcome: Progressing Goal: Ability to demonstrate self-control will improve Outcome: Progressing    Problem: Health Behavior/Discharge Planning: Goal: Identification of resources available to assist in meeting health care needs will improve Outcome: Progressing Goal: Compliance with treatment plan for underlying cause of condition will improve Outcome: Progressing   Problem: Physical Regulation: Goal: Ability to maintain clinical measurements within normal limits will improve Outcome: Progressing   Problem: Safety: Goal: Periods of time without injury will increase Outcome: Progressing   Problem: Education: Goal: Ability to state activities that reduce stress will improve Outcome: Progressing   Problem: Coping: Goal: Ability to identify and develop effective coping behavior will improve Outcome: Progressing   Problem: Self-Concept: Goal: Ability to identify factors that promote anxiety will improve Outcome: Progressing Goal: Level of anxiety will decrease Outcome: Progressing Goal: Ability to modify response to factors that promote anxiety will improve Outcome: Progressing   Problem: Education: Goal: Ability to make informed decisions regarding treatment will improve Outcome: Progressing   Problem: Coping: Goal: Coping ability will improve Outcome: Progressing   Problem: Health Behavior/Discharge Planning: Goal: Identification of resources available to assist in meeting health care needs will improve Outcome: Progressing   Problem: Medication: Goal: Compliance with prescribed medication regimen will improve Outcome: Progressing   Problem: Self-Concept: Goal: Ability to disclose and discuss suicidal ideas will improve Outcome: Progressing Goal: Will verbalize positive feelings about self Outcome: Progressing   

## 2020-11-04 NOTE — Plan of Care (Signed)
  Problem: Education: Goal: Utilization of techniques to improve thought processes will improve Outcome: Progressing Goal: Knowledge of the prescribed therapeutic regimen will improve Outcome: Progressing   Problem: Activity: Goal: Interest or engagement in leisure activities will improve Outcome: Progressing Goal: Imbalance in normal sleep/wake cycle will improve Outcome: Progressing   Problem: Coping: Goal: Coping ability will improve Outcome: Progressing Goal: Will verbalize feelings Outcome: Progressing   Problem: Health Behavior/Discharge Planning: Goal: Ability to make decisions will improve Outcome: Progressing Goal: Compliance with therapeutic regimen will improve Outcome: Progressing   Problem: Role Relationship: Goal: Will demonstrate positive changes in social behaviors and relationships Outcome: Progressing   Problem: Safety: Goal: Ability to disclose and discuss suicidal ideas will improve Outcome: Progressing Goal: Ability to identify and utilize support systems that promote safety will improve Outcome: Progressing   Problem: Self-Concept: Goal: Will verbalize positive feelings about self Outcome: Progressing Goal: Level of anxiety will decrease Outcome: Progressing   Problem: Education: Goal: Knowledge of [place] [facility] Education information/materials will improve Outcome: Progressing Goal: Emotional status will improve Outcome: Progressing Goal: Mental status will improve Outcome: Progressing Goal: Verbalization of understanding the information provided will improve Outcome: Progressing   Problem: Activity: Goal: Interest or engagement in activities will improve Outcome: Progressing Goal: Sleeping patterns will improve Outcome: Progressing   Problem: Coping: Goal: Ability to verbalize frustrations and anger appropriately will improve Outcome: Progressing Goal: Ability to demonstrate self-control will improve Outcome: Progressing    Problem: Health Behavior/Discharge Planning: Goal: Identification of resources available to assist in meeting health care needs will improve Outcome: Progressing Goal: Compliance with treatment plan for underlying cause of condition will improve Outcome: Progressing   Problem: Physical Regulation: Goal: Ability to maintain clinical measurements within normal limits will improve Outcome: Progressing   Problem: Safety: Goal: Periods of time without injury will increase Outcome: Progressing   Problem: Education: Goal: Ability to state activities that reduce stress will improve Outcome: Progressing   Problem: Coping: Goal: Ability to identify and develop effective coping behavior will improve Outcome: Progressing   Problem: Self-Concept: Goal: Ability to identify factors that promote anxiety will improve Outcome: Progressing Goal: Level of anxiety will decrease Outcome: Progressing Goal: Ability to modify response to factors that promote anxiety will improve Outcome: Progressing   Problem: Education: Goal: Ability to make informed decisions regarding treatment will improve Outcome: Progressing   Problem: Coping: Goal: Coping ability will improve Outcome: Progressing   Problem: Health Behavior/Discharge Planning: Goal: Identification of resources available to assist in meeting health care needs will improve Outcome: Progressing   Problem: Medication: Goal: Compliance with prescribed medication regimen will improve Outcome: Progressing   Problem: Self-Concept: Goal: Ability to disclose and discuss suicidal ideas will improve Outcome: Progressing Goal: Will verbalize positive feelings about self Outcome: Progressing   

## 2020-11-04 NOTE — Progress Notes (Signed)
Patient has been pleasant and cooperative. Spending time out in the day room socializing with peers. Appears less confused. Denies SI, HI AVH and withdrawal symptoms

## 2020-11-05 MED ORDER — HYDROXYZINE HCL 50 MG PO TABS
50.0000 mg | ORAL_TABLET | Freq: Three times a day (TID) | ORAL | Status: DC | PRN
  Administered 2020-11-05 – 2020-11-09 (×9): 50 mg via ORAL
  Filled 2020-11-05 (×9): qty 1

## 2020-11-05 MED ORDER — NALTREXONE HCL 50 MG PO TABS
50.0000 mg | ORAL_TABLET | Freq: Every day | ORAL | Status: DC
  Administered 2020-11-06 – 2020-11-09 (×4): 50 mg via ORAL
  Filled 2020-11-05 (×4): qty 1

## 2020-11-05 MED ORDER — NICOTINE POLACRILEX 2 MG MT GUM
2.0000 mg | CHEWING_GUM | OROMUCOSAL | Status: DC | PRN
  Administered 2020-11-05 – 2020-11-09 (×6): 2 mg via ORAL
  Filled 2020-11-05 (×8): qty 1

## 2020-11-05 MED ORDER — POLYETHYLENE GLYCOL 3350 17 G PO PACK: 17.0000 g | PACK | Freq: Once | ORAL | Status: DC

## 2020-11-05 NOTE — Progress Notes (Signed)
[facility] MD Progress Note  11/05/2020 11:40 AM Lee Sparks  MRN:  [id number]  CC "Got nothing to live for"  Subjective:  Patient is a 50 year old male with alcohol use disorder and major depressive disorder presenting for suicidal ideations with plan to drink himself to death. No acute events overnight, medication compliant. This morning patient continues to endorse suicidal ideations, but contracts for safety in the hospital. He notes he has no reason to keep on living because his family will no longer speak to him. He also continues to endorse some alcohol withdrawal symptoms. He denies any homicidal ideations, visual hallucinations, and auditory hallucinations. He is tearful on exam.   Principal Problem: Severe recurrent major depression without psychotic features (HCC) Diagnosis: Principal Problem:   Severe recurrent major depression without psychotic features (HCC) Active Problems:   Hypertension   Alcohol withdrawal syndrome (HCC)   Severe alcohol use disorder (HCC)  Total Time spent with patient: 20 minutes  Past Psychiatric History: See H&P  Past Medical History:  Past Medical History:  Diagnosis Date  . Alcohol-induced pancreatitis   . Anemia   . Bronchitis     Past Surgical History:  Procedure Laterality Date  . CYSTECTOMY    . ESOPHAGOGASTRODUODENOSCOPY  07/28/2011   Procedure: ESOPHAGOGASTRODUODENOSCOPY (EGD);  Surgeon: Freddy Jaksch, MD;  Location: [facility] ENDOSCOPY;  Service: Endoscopy;  Laterality: N/A;  . HERNIA REPAIR     Family History:  Family History  Problem Relation Age of Onset  . Hypertension Father   . Aneurysm Father   . Cancer Other   . Hypertension Mother   . Pneumonia Mother    Family Psychiatric  History: See H&P Social History:  Social History   Substance and Sexual Activity  Alcohol Use Yes  . Alcohol/week: 50.0 standard drinks  . Types: 50 Cans of beer per week   Comment: Equivalent of two 40 oz beers daily.     Social History    Substance and Sexual Activity  Drug Use No    Social History   Socioeconomic History  . Marital status: Single    Spouse name: Not on file  . Number of children: Not on file  . Years of education: Not on file  . Highest education level: Not on file  Occupational History  . Not on file  Tobacco Use  . Smoking status: Current Every Day Smoker    Packs/day: 0.50    Years: 15.00    Pack years: 7.50    Types: Cigarettes    Last attempt to quit: 06/27/2011    Years since quitting: 9.3  . Smokeless tobacco: Never Used  Substance and Sexual Activity  . Alcohol use: Yes    Alcohol/week: 50.0 standard drinks    Types: 50 Cans of beer per week    Comment: Equivalent of two 40 oz beers daily.  . Drug use: No  . Sexual activity: Yes  Other Topics Concern  . Not on file  Social History Narrative  . Not on file   Social Determinants of Health   Financial Resource Strain: Not on file  Food Insecurity: Not on file  Transportation Needs: Not on file  Physical Activity: Not on file  Stress: Not on file  Social Connections: Not on file   Additional Social History:     Sleep: Fair  Appetite:  Poor  Current Medications: Current Facility-Administered Medications  Medication Dose Route Frequency Provider Last Rate Last Admin  . acetaminophen (TYLENOL) tablet 650 mg  650  mg Oral Q6H PRN [name], [name], MD   650 mg at 11/05/20 1102  . alum & mag hydroxide-simeth (MAALOX/MYLANTA) 200-200-20 MG/5ML suspension 30 mL  30 mL Oral Q4H PRN [name], [name], MD      . buPROPion (WELLBUTRIN XL) 24 hr tablet 150 mg  150 mg Oral Daily [name], MD   150 mg at 11/05/20 0801  . feeding supplement (ENSURE ENLIVE / ENSURE PLUS) liquid 237 mL  237 mL Oral BID BM [name], MD   237 mL at 11/05/20 1000  . folic acid (FOLVITE) tablet 1 mg  1 mg Oral Daily [name], MD   1 mg at 11/05/20 0804  . hydrOXYzine (ATARAX/VISTARIL) tablet 50 mg  50 mg Oral TID PRN [name],  MD   50 mg at 11/05/20 0920  . magnesium hydroxide (MILK OF MAGNESIA) suspension 30 mL  30 mL Oral Daily PRN [name], [name], MD      . mirtazapine (REMERON) tablet 30 mg  30 mg Oral QHS [name], MD   30 mg at 11/04/20 2106  . multivitamin with minerals tablet 1 tablet  1 tablet Oral Daily [name], MD   1 tablet at 11/05/20 0801  . nicotine polacrilex (NICORETTE) gum 2 mg  2 mg Oral Q4H PRN [name], MD      . ondansetron [facility]) tablet 8 mg  8 mg Oral Q8H PRN [name], MD   8 mg at 11/05/20 0801  . polyethylene glycol (MIRALAX / GLYCOLAX) packet 17 g  17 g Oral Once [name], MD      . thiamine tablet 100 mg  100 mg Oral Daily [name], [name], MD   100 mg at 11/05/20 0802   Or  . thiamine (B-1) injection 100 mg  100 mg Intravenous Daily [name], [name], MD      . traZODone (DESYREL) tablet 100 mg  100 mg Oral QHS PRN [name], [name], MD        Lab Results: No results found for this or any previous visit (from the past 48 hour(s)).  Blood Alcohol level:  Lab Results  Component Value Date   ETH [facility]) 10/30/2020   ETH <10 07/31/2020    Metabolic Disorder Labs: Lab Results  Component Value Date   HGBA1C 5.7 (H) 05/30/2020   No results found for: PROLACTIN Lab Results  Component Value Date   CHOL 206 (H) 05/30/2020   TRIG 187 (H) 05/30/2020   HDL 28 (L) 05/30/2020   CHOLHDL 7.4 (H) 05/30/2020   LDLCALC 144 (H) 05/30/2020   LDLCALC 98 06/28/2013    Physical Findings: AIMS:  , ,  ,  ,    CIWA:  CIWA-Ar Total: 0 COWS:     Musculoskeletal: Strength & Muscle Tone: within normal limits Gait & Station: unsteady Patient leans: Front  Psychiatric Specialty Exam:  Presentation  General Appearance: Casual  Eye Contact:Fair Speech:Normal rate Speech Volume:Normal  Handedness:Right   Mood and Affect  Mood:Depressed; Dysphoric  Affect:Tearful   Thought Process  Thought Processes:Goal Directed  Descriptions of  Associations:Intact  Orientation:Full (Time, Place and Person)  Thought Content:Rumination  History of Schizophrenia/Schizoaffective disorder:No  Duration of Psychotic Symptoms: Hallucinations:None Ideas of Reference:None  Suicidal Thoughts:Yes, passive without access to means Homicidal Thoughts:Denies  Sensorium  Memory:Immediate Fair; Recent Fair; Remote Fair  Judgment:Intact  Insight:Present   Executive Functions  Concentration:Fair Attention Span:Fair Recall:Fair  Fund of Knowledge:Fair  Language:Fair   Psychomotor Activity  Psychomotor Activity:Decreased  Assets  Assets:Communication Skills; Desire for Improvement; Resilience   Sleep  Sleep:Fair, 8 hours   Physical Exam: Physical Exam  ROS  Blood pressure (!) 130/102, pulse 98, temperature 98.4 F (36.9 C), temperature source Oral, resp. rate 18, height 5\' 9"  (1.753 m), weight 81.6 kg, SpO2 100 %. Body mass index is 26.58 kg/m.   Treatment Plan Summary: Daily contact with patient to assess and evaluate symptoms and progress in treatment and Medication management   1)MDD, recurrent, severe without psychotic features- unstable. Continues to endorse suicidal ideations with plan to drink himself to death. Sees no reason to keep living - Continue Remeron 30 mg QHS - Continue Wellbutrin XL 150 mg daily (started yesterday)  2) Alcohol Use Disorder, severe with acute alcohol withdrawals - CIWA protocol in place - MVI, thiamine, folic acid -Start Naltrexone 50 mg daily  11/05/20: Psychiatric exam above reviewed and remains accurate. Assessment and plan above reviewed and updated.   01/05/21, MD 11/05/2020, 11:40 AM

## 2020-11-05 NOTE — Progress Notes (Signed)
Recreation Therapy Notes  Date: 11/05/2020  Time: 10:00 am   Location: Craft room    Behavioral response: N/A   Intervention Topic: Self-care   Discussion/Intervention: Patient did not attend group.   Clinical Observations/Feedback:  Patient did not attend group.   Maleyah Evans LRT/CTRS        Dinnis Rog 11/05/2020 12:00 PM

## 2020-11-05 NOTE — Plan of Care (Signed)
Patient compliant with medication administration per MD orders and procedures on the unit   Problem: Health Behavior/Discharge Planning: Goal: Compliance with therapeutic regimen will improve Outcome: Progressing   

## 2020-11-05 NOTE — Progress Notes (Signed)
Patient calm and cooperative during assessment. Pt endorses anxiety. Pt more active on the unit tonight than when this writer had him previously during this admission. Patient given education, support, and encouragement to be active in his treatment plan. Pt being monitored Q 15 minutes for safety per unit protocol. Pt remains safe on the unit. Patient observed interacting appropriately with staff and peers on the unit. Patient compliant with medication administration per MD orders.

## 2020-11-05 NOTE — Plan of Care (Signed)
Pt rates depression and anxiety both 9/10. Pt denies HI but has passive SI with a plan to "drink myself to death". Pt contracts for safety. Pt was educated on care plan and verbalizes understanding. Torrie Mayers RN Problem: Education: Goal: Utilization of techniques to improve thought processes will improve Outcome: Progressing Goal: Knowledge of the prescribed therapeutic regimen will improve Outcome: Progressing   Problem: Activity: Goal: Interest or engagement in leisure activities will improve Outcome: Progressing Goal: Imbalance in normal sleep/wake cycle will improve Outcome: Progressing   Problem: Coping: Goal: Coping ability will improve Outcome: Progressing Goal: Will verbalize feelings Outcome: Progressing   Problem: Health Behavior/Discharge Planning: Goal: Ability to make decisions will improve Outcome: Progressing Goal: Compliance with therapeutic regimen will improve Outcome: Progressing   Problem: Role Relationship: Goal: Will demonstrate positive changes in social behaviors and relationships Outcome: Progressing   Problem: Safety: Goal: Ability to disclose and discuss suicidal ideas will improve Outcome: Progressing Goal: Ability to identify and utilize support systems that promote safety will improve Outcome: Progressing   Problem: Self-Concept: Goal: Will verbalize positive feelings about self Outcome: Progressing Goal: Level of anxiety will decrease Outcome: Progressing   Problem: Education: Goal: Knowledge of [place] [facility] Education information/materials will improve Outcome: Progressing Goal: Emotional status will improve Outcome: Progressing Goal: Mental status will improve Outcome: Progressing Goal: Verbalization of understanding the information provided will improve Outcome: Progressing   Problem: Activity: Goal: Interest or engagement in activities will improve Outcome: Progressing Goal: Sleeping patterns will improve Outcome:  Progressing   Problem: Coping: Goal: Ability to verbalize frustrations and anger appropriately will improve Outcome: Progressing Goal: Ability to demonstrate self-control will improve Outcome: Progressing   Problem: Health Behavior/Discharge Planning: Goal: Identification of resources available to assist in meeting health care needs will improve Outcome: Progressing Goal: Compliance with treatment plan for underlying cause of condition will improve Outcome: Progressing   Problem: Physical Regulation: Goal: Ability to maintain clinical measurements within normal limits will improve Outcome: Progressing   Problem: Coping: Goal: Ability to identify and develop effective coping behavior will improve Outcome: Progressing   Problem: Self-Concept: Goal: Ability to identify factors that promote anxiety will improve Outcome: Progressing Goal: Level of anxiety will decrease Outcome: Progressing Goal: Ability to modify response to factors that promote anxiety will improve Outcome: Progressing   Problem: Education: Goal: Ability to make informed decisions regarding treatment will improve Outcome: Progressing   Problem: Coping: Goal: Coping ability will improve Outcome: Progressing   Problem: Health Behavior/Discharge Planning: Goal: Identification of resources available to assist in meeting health care needs will improve Outcome: Progressing   Problem: Medication: Goal: Compliance with prescribed medication regimen will improve Outcome: Progressing   Problem: Self-Concept: Goal: Ability to disclose and discuss suicidal ideas will improve Outcome: Progressing Goal: Will verbalize positive feelings about self Outcome: Progressing

## 2020-11-05 NOTE — [facility] Group Notes (Signed)
LCSW Group Therapy Note   11/05/2020 1:47 PM  Type of Therapy and Topic:  Group Therapy:  Overcoming Obstacles   Participation Level:  Active   Description of Group:    In this group patients will be encouraged to explore what they see as obstacles to their own wellness and recovery. They will be guided to discuss their thoughts, feelings, and behaviors related to these obstacles. The group will process together ways to cope with barriers, with attention given to specific choices patients can make. Each patient will be challenged to identify changes they are motivated to make in order to overcome their obstacles. This group will be process-oriented, with patients participating in exploration of their own experiences as well as giving and receiving support and challenge from other group members.   Therapeutic Goals: 1. Patient will identify personal and current obstacles as they relate to admission. 2. Patient will identify barriers that currently interfere with their wellness or overcoming obstacles.  3. Patient will identify feelings, thought process and behaviors related to these barriers. 4. Patient will identify two changes they are willing to make to overcome these obstacles:      Summary of Patient Progress Patient was present for the majority of group. He identified anxiety, depression, and suicidal thoughts as obstacles for his recovery. Pt stated that lack of social support (issues with his family) have kept him from overcoming these obstacles. However, pt was able to identify some positive leisure that could help/things that he likes to do including playing drums, and bowling. He also stated desire to learn to play the piano. Pt's comments/feedback were pertinent to the discussion.     Therapeutic Modalities:   Cognitive Behavioral Therapy Solution Focused Therapy Motivational Interviewing Relapse Prevention Therapy  Lee Sparks R. [name], MSW, LCSW, LCAS 11/05/2020 1:47 PM

## 2020-11-05 NOTE — Progress Notes (Signed)
Pt now rates depression 8/10 and anxiety 9/10. Pt denies but still says he has passive SI but contracts for safety. Pt has AH "all day" and VH. Pt mostly remains in the day room and has been social, laughing and talking but still mostly with a flat affect. Pt did not attend group but went out into the courtyard.  Pt has received two PRNs that were effective. Torrie Mayers RN

## 2020-11-06 MED ORDER — MIRTAZAPINE 15 MG PO TABS
45.0000 mg | ORAL_TABLET | Freq: Every day | ORAL | Status: DC
  Administered 2020-11-06 – 2020-11-08 (×3): 45 mg via ORAL
  Filled 2020-11-06 (×3): qty 3

## 2020-11-06 NOTE — Progress Notes (Signed)
Pt rates depression 8/10 and anxiety 9/10. Pt has passive SI with no plan which is an improvement. Pt denies VH and HI. Pt has AH. Pt went outdoors for groups. Pt has a grimaced, anxious affect. Pt has received PRNs for anxiety. Pt remains social in day room. Torrie Mayers RN

## 2020-11-06 NOTE — Plan of Care (Signed)
Patient compliant with medication administration per MD orders and procedures on the unit   Problem: Health Behavior/Discharge Planning: Goal: Compliance with therapeutic regimen will improve Outcome: Progressing   

## 2020-11-06 NOTE — [facility] Group Notes (Signed)
LCSW Group Therapy Note     11/06/2020 2:35 PM     Type of Therapy/Topic:  Group Therapy:  Feelings about Diagnosis     Participation Level:  Active     Description of Group:   This group will allow patients to explore their thoughts and feelings about diagnoses they have received. Patients will be guided to explore their level of understanding and acceptance of these diagnoses. Facilitator will encourage patients to process their thoughts and feelings about the reactions of others to their diagnosis and will guide patients in identifying ways to discuss their diagnosis with significant others in their lives. This group will be process-oriented, with patients participating in exploration of their own experiences, giving and receiving support, and processing challenge from other group members.        Therapeutic Goals:  1.    Patient will demonstrate understanding of diagnosis as evidenced by identifying two or more symptoms of the disorder  2.    Patient will be able to express two feelings regarding the diagnosis  3.    Patient will demonstrate their ability to communicate their needs through discussion and/or role play     Summary of Patient Progress: Patient was present for the entirety of the group session. Patient was an active listener and participated in the topic of discussion, provided helpful advice to others, and added nuance to topic of conversation.  Pt discussed how two symptoms of his depression are worthlessness and irritability. He stated that he used alcohol to numb his feelings of rejection from his daughter. Pt stated that moving forward he wanted to "take it day by day" and try to repair his family relationships.   Therapeutic Modalities:   Cognitive Behavioral Therapy  Brief Therapy  Feelings Identification    Verdun Rackley [place], MSW, LCSW-A  11/06/2020 2:35 PM

## 2020-11-06 NOTE — Progress Notes (Signed)
Recreation Therapy Notes    Date: 11/06/2020   Time: 9:30 am   Location: Craft room    Behavioral response: N/A   Intervention Topic: Wellness    Discussion/Intervention: Patient did not attend group.   Clinical Observations/Feedback:  Patient did not attend group.   Lee Sparks LRT/CTRS          Lee Sparks 11/06/2020 11:17 AM 

## 2020-11-06 NOTE — Plan of Care (Signed)
Pt rates depression 9/10, hopelessness 9/10 and anxiety 8/10. Pt has passive SI but contracts for safety. Pt denies HI. Pt has AVH. Pt was educated on care plan and verbalizes understanding. Torrie Mayers RN Problem: Education: Goal: Utilization of techniques to improve thought processes will improve Outcome: Progressing Goal: Knowledge of the prescribed therapeutic regimen will improve Outcome: Progressing   Problem: Activity: Goal: Interest or engagement in leisure activities will improve Outcome: Progressing Goal: Imbalance in normal sleep/wake cycle will improve Outcome: Progressing   Problem: Coping: Goal: Coping ability will improve Outcome: Progressing Goal: Will verbalize feelings Outcome: Progressing   Problem: Health Behavior/Discharge Planning: Goal: Ability to make decisions will improve Outcome: Progressing Goal: Compliance with therapeutic regimen will improve Outcome: Progressing   Problem: Role Relationship: Goal: Will demonstrate positive changes in social behaviors and relationships Outcome: Progressing   Problem: Safety: Goal: Ability to disclose and discuss suicidal ideas will improve Outcome: Progressing Goal: Ability to identify and utilize support systems that promote safety will improve Outcome: Progressing   Problem: Self-Concept: Goal: Will verbalize positive feelings about self Outcome: Progressing Goal: Level of anxiety will decrease Outcome: Progressing   Problem: Education: Goal: Knowledge of [facility] Education information/materials will improve Outcome: Progressing Goal: Emotional status will improve Outcome: Progressing Goal: Mental status will improve Outcome: Progressing Goal: Verbalization of understanding the information provided will improve Outcome: Progressing   Problem: Activity: Goal: Interest or engagement in activities will improve Outcome: Progressing Goal: Sleeping patterns will improve Outcome:  Progressing   Problem: Coping: Goal: Ability to verbalize frustrations and anger appropriately will improve Outcome: Progressing Goal: Ability to demonstrate self-control will improve Outcome: Progressing   Problem: Health Behavior/Discharge Planning: Goal: Identification of resources available to assist in meeting health care needs will improve Outcome: Progressing Goal: Compliance with treatment plan for underlying cause of condition will improve Outcome: Progressing   Problem: Physical Regulation: Goal: Ability to maintain clinical measurements within normal limits will improve Outcome: Progressing   Problem: Safety: Goal: Periods of time without injury will increase Outcome: Progressing   Problem: Education: Goal: Ability to state activities that reduce stress will improve Outcome: Progressing   Problem: Coping: Goal: Ability to identify and develop effective coping behavior will improve Outcome: Progressing   Problem: Self-Concept: Goal: Ability to identify factors that promote anxiety will improve Outcome: Progressing Goal: Level of anxiety will decrease Outcome: Progressing Goal: Ability to modify response to factors that promote anxiety will improve Outcome: Progressing   Problem: Education: Goal: Ability to make informed decisions regarding treatment will improve Outcome: Progressing   Problem: Coping: Goal: Coping ability will improve Outcome: Progressing   Problem: Health Behavior/Discharge Planning: Goal: Identification of resources available to assist in meeting health care needs will improve Outcome: Progressing   Problem: Medication: Goal: Compliance with prescribed medication regimen will improve Outcome: Progressing   Problem: Self-Concept: Goal: Ability to disclose and discuss suicidal ideas will improve Outcome: Progressing Goal: Will verbalize positive feelings about self Outcome: Progressing

## 2020-11-06 NOTE — Progress Notes (Signed)
[facility] MD Progress Note  11/06/2020 1:39 PM Lee Sparks  MRN:  [id number]  CC "Got nothing to live for"  Subjective:  Patient is a 50 year old male with alcohol use disorder and major depressive disorder presenting for suicidal ideations with plan to drink himself to death. No acute events overnight, medication compliant. This morning patient notes that he continues to have high anxiety and depression. Also continues to report passive suicidal ideations. Denies homicidal ideations, visual hallucinations, or auditory hallucinations.   Principal Problem: Severe recurrent major depression without psychotic features (HCC) Diagnosis: Principal Problem:   Severe recurrent major depression without psychotic features (HCC) Active Problems:   Hypertension   Alcohol withdrawal syndrome (HCC)   Severe alcohol use disorder (HCC)  Total Time spent with patient: 20 minutes  Past Psychiatric History: See H&P  Past Medical History:  Past Medical History:  Diagnosis Date  . Alcohol-induced pancreatitis   . Anemia   . Bronchitis     Past Surgical History:  Procedure Laterality Date  . CYSTECTOMY    . ESOPHAGOGASTRODUODENOSCOPY  07/28/2011   Procedure: ESOPHAGOGASTRODUODENOSCOPY (EGD);  Surgeon: Freddy Jaksch, MD;  Location: [facility] ENDOSCOPY;  Service: Endoscopy;  Laterality: N/A;  . HERNIA REPAIR     Family History:  Family History  Problem Relation Age of Onset  . Hypertension Father   . Aneurysm Father   . Cancer Other   . Hypertension Mother   . Pneumonia Mother    Family Psychiatric  History: See H&P Social History:  Social History   Substance and Sexual Activity  Alcohol Use Yes  . Alcohol/week: 50.0 standard drinks  . Types: 50 Cans of beer per week   Comment: Equivalent of two 40 oz beers daily.     Social History   Substance and Sexual Activity  Drug Use No    Social History   Socioeconomic History  . Marital status: Single    Spouse name: Not on file  . Number  of children: Not on file  . Years of education: Not on file  . Highest education level: Not on file  Occupational History  . Not on file  Tobacco Use  . Smoking status: Current Every Day Smoker    Packs/day: 0.50    Years: 15.00    Pack years: 7.50    Types: Cigarettes    Last attempt to quit: 06/27/2011    Years since quitting: 9.3  . Smokeless tobacco: Never Used  Substance and Sexual Activity  . Alcohol use: Yes    Alcohol/week: 50.0 standard drinks    Types: 50 Cans of beer per week    Comment: Equivalent of two 40 oz beers daily.  . Drug use: No  . Sexual activity: Yes  Other Topics Concern  . Not on file  Social History Narrative  . Not on file   Social Determinants of Health   Financial Resource Strain: Not on file  Food Insecurity: Not on file  Transportation Needs: Not on file  Physical Activity: Not on file  Stress: Not on file  Social Connections: Not on file   Additional Social History:     Sleep: Fair  Appetite:  Poor  Current Medications: Current Facility-Administered Medications  Medication Dose Route Frequency Provider Last Rate Last Admin  . acetaminophen (TYLENOL) tablet 650 mg  650 mg Oral Q6H PRN [name], [name], MD   650 mg at 11/03/20 0853  . alum & mag hydroxide-simeth (MAALOX/MYLANTA) 200-200-20 MG/5ML suspension 30 mL  30 mL  Oral Q4H PRN [name], [name], MD      . buPROPion (WELLBUTRIN XL) 24 hr tablet 150 mg  150 mg Oral Daily [name], MD   150 mg at 11/06/20 0815  . feeding supplement (ENSURE ENLIVE / ENSURE PLUS) liquid 237 mL  237 mL Oral BID BM [name], MD   237 mL at 11/06/20 1030  . folic acid (FOLVITE) tablet 1 mg  1 mg Oral Daily [name], MD   1 mg at 11/06/20 0815  . hydrOXYzine (ATARAX/VISTARIL) tablet 50 mg  50 mg Oral TID PRN [name], MD   50 mg at 11/06/20 0815  . magnesium hydroxide (MILK OF MAGNESIA) suspension 30 mL  30 mL Oral Daily PRN [name], [name], MD      . mirtazapine (REMERON)  tablet 30 mg  30 mg Oral QHS [name], MD   30 mg at 11/05/20 2109  . multivitamin with minerals tablet 1 tablet  1 tablet Oral Daily [name], MD   1 tablet at 11/06/20 0815  . naltrexone (DEPADE) tablet 50 mg  50 mg Oral Daily [name], MD   50 mg at 11/06/20 0816  . nicotine polacrilex (NICORETTE) gum 2 mg  2 mg Oral Q4H PRN [name], MD   2 mg at 11/05/20 1102  . ondansetron (ZOFRAN) tablet 8 mg  8 mg Oral Q8H PRN [name], MD   8 mg at 11/05/20 0801  . polyethylene glycol (MIRALAX / GLYCOLAX) packet 17 g  17 g Oral Once [name], MD      . thiamine tablet 100 mg  100 mg Oral Daily [name], [name], MD   100 mg at 11/06/20 0815   Or  . thiamine (B-1) injection 100 mg  100 mg Intravenous Daily [name], [name], MD      . traZODone (DESYREL) tablet 100 mg  100 mg Oral QHS PRN [name], [name], MD   100 mg at 11/05/20 2109    Lab Results: No results found for this or any previous visit (from the past 48 hour(s)).  Blood Alcohol level:  Lab Results  Component Value Date   ETH [address] [facility]) 10/30/2020   ETH <10 07/31/2020    Metabolic Disorder Labs: Lab Results  Component Value Date   HGBA1C 5.7 (H) 05/30/2020   No results found for: PROLACTIN Lab Results  Component Value Date   CHOL 206 (H) 05/30/2020   TRIG 187 (H) 05/30/2020   HDL 28 (L) 05/30/2020   CHOLHDL 7.4 (H) 05/30/2020   LDLCALC 144 (H) 05/30/2020   LDLCALC 98 06/28/2013    Physical Findings: AIMS:  , ,  ,  ,    CIWA:  CIWA-Ar Total: 0 COWS:     Musculoskeletal: Strength & Muscle Tone: within normal limits Gait & Station: unsteady Patient leans: Front  Psychiatric Specialty Exam:  Presentation  General Appearance: Casual  Eye Contact:Fair Speech:Normal rate Speech Volume:Normal  Handedness:Right   Mood and Affect  Mood:Depressed; Dysphoric  Affect:Tearful   Thought Process  Thought Processes:Goal Directed  Descriptions of  Associations:Intact  Orientation:Full (Time, Place and Person)  Thought Content:Rumination  History of Schizophrenia/Schizoaffective disorder:No  Duration of Psychotic Symptoms: Hallucinations:None Ideas of Reference:None  Suicidal Thoughts:Yes, passive without access to means Homicidal Thoughts:Denies  Sensorium  Memory:Immediate Fair; Recent Fair; Remote Fair  Judgment:Intact  Insight:Present   Executive Functions  Concentration:Fair Attention Span:Fair Recall:Fair  Fund of Knowledge:Fair  Language:Fair   Psychomotor Activity  Psychomotor Activity:Decreased  Assets  Assets:Communication Skills; Desire for Improvement; Resilience   Sleep  Sleep:Fair, 7 hours   Physical Exam: Physical Exam  ROS  Blood pressure (!) 126/95, pulse 96, temperature 97.9 F (36.6 C), temperature source Oral, resp. rate 18, height 5\' 9"  (1.753 m), weight 81.6 kg, SpO2 99 %. Body mass index is 26.58 kg/m.   Treatment Plan Summary: Daily contact with patient to assess and evaluate symptoms and progress in treatment and Medication management   1)MDD, recurrent, severe without psychotic features- unstable. Continues to endorse passive suicidal ideations, continues to lack hope for the future.  - Increase Remeron 45 mg QHS - Continue Wellbutrin XL 150 mg daily (started yesterday)  2) Alcohol Use Disorder, severe with acute alcohol withdrawals - CIWA protocol completed, continue MVI, thiamine, folic acide - Continue Naltrexone 50 mg daily  11/06/20: Psychiatric exam above reviewed and remains accurate. Assessment and plan above reviewed and updated.    01/06/21, MD 11/06/2020, 1:39 PM

## 2020-11-07 MED ORDER — HYDROXYZINE HCL 50 MG PO TABS
50.0000 mg | ORAL_TABLET | Freq: Every day | ORAL | Status: DC
  Administered 2020-11-07 – 2020-11-08 (×2): 50 mg via ORAL
  Filled 2020-11-07 (×2): qty 1

## 2020-11-07 NOTE — Progress Notes (Signed)
[facility] MD Progress Note  11/07/2020 12:51 PM Lee Sparks  MRN:  [id number]  CC "Trazodone made my heart beat out of my chest"  Subjective:  Patient is a 50 year old male with alcohol use disorder and major depressive disorder presenting for suicidal ideations with plan to drink himself to death. No acute events overnight, medication compliant. This morning patient notes difficulty falling asleep. He felt that trazodone was causing him to feel like his heart was beating out of his chest. He requests an alternate sleeping aid. Will schedule vistaril at bedtime tonight. He continues to have passive SI, but contracts for safety. He denies HI/AH/VH.  Feels Wellbutrin and Remeron have been somewhat helpful for depression and anxiety.   Principal Problem: Severe recurrent major depression without psychotic features (HCC) Diagnosis: Principal Problem:   Severe recurrent major depression without psychotic features (HCC) Active Problems:   Hypertension   Alcohol withdrawal syndrome (HCC)   Severe alcohol use disorder (HCC)  Total Time spent with patient: 20 minutes  Past Psychiatric History: See H&P  Past Medical History:  Past Medical History:  Diagnosis Date  . Alcohol-induced pancreatitis   . Anemia   . Bronchitis     Past Surgical History:  Procedure Laterality Date  . CYSTECTOMY    . ESOPHAGOGASTRODUODENOSCOPY  07/28/2011   Procedure: ESOPHAGOGASTRODUODENOSCOPY (EGD);  Surgeon: Freddy Jaksch, MD;  Location: [facility] ENDOSCOPY;  Service: Endoscopy;  Laterality: N/A;  . HERNIA REPAIR     Family History:  Family History  Problem Relation Age of Onset  . Hypertension Father   . Aneurysm Father   . Cancer Other   . Hypertension Mother   . Pneumonia Mother    Family Psychiatric  History: See H&P Social History:  Social History   Substance and Sexual Activity  Alcohol Use Yes  . Alcohol/week: 50.0 standard drinks  . Types: 50 Cans of beer per week   Comment: Equivalent of two 40  oz beers daily.     Social History   Substance and Sexual Activity  Drug Use No    Social History   Socioeconomic History  . Marital status: Single    Spouse name: Not on file  . Number of children: Not on file  . Years of education: Not on file  . Highest education level: Not on file  Occupational History  . Not on file  Tobacco Use  . Smoking status: Current Every Day Smoker    Packs/day: 0.50    Years: 15.00    Pack years: 7.50    Types: Cigarettes    Last attempt to quit: 06/27/2011    Years since quitting: 9.3  . Smokeless tobacco: Never Used  Substance and Sexual Activity  . Alcohol use: Yes    Alcohol/week: 50.0 standard drinks    Types: 50 Cans of beer per week    Comment: Equivalent of two 40 oz beers daily.  . Drug use: No  . Sexual activity: Yes  Other Topics Concern  . Not on file  Social History Narrative  . Not on file   Social Determinants of Health   Financial Resource Strain: Not on file  Food Insecurity: Not on file  Transportation Needs: Not on file  Physical Activity: Not on file  Stress: Not on file  Social Connections: Not on file   Additional Social History:     Sleep: Fair  Appetite:  Poor  Current Medications: Current Facility-Administered Medications  Medication Dose Route Frequency Provider Last Rate Last Admin  .  acetaminophen (TYLENOL) tablet 650 mg  650 mg Oral Q6H PRN [name], [name], MD   650 mg at 11/03/20 0853  . alum & mag hydroxide-simeth (MAALOX/MYLANTA) 200-200-20 MG/5ML suspension 30 mL  30 mL Oral Q4H PRN [name], [name], MD   30 mL at 11/06/20 1734  . buPROPion (WELLBUTRIN XL) 24 hr tablet 150 mg  150 mg Oral Daily [name], MD   150 mg at 11/07/20 0817  . feeding supplement (ENSURE ENLIVE / ENSURE PLUS) liquid 237 mL  237 mL Oral BID BM [name], MD   237 mL at 11/07/20 1038  . folic acid (FOLVITE) tablet 1 mg  1 mg Oral Daily [name], MD   1 mg at 11/07/20 0817  . hydrOXYzine  (ATARAX/VISTARIL) tablet 50 mg  50 mg Oral TID PRN [name], MD   50 mg at 11/07/20 2951  . hydrOXYzine (ATARAX/VISTARIL) tablet 50 mg  50 mg Oral QHS [name], MD      . magnesium hydroxide (MILK OF MAGNESIA) suspension 30 mL  30 mL Oral Daily PRN [name], [name], MD      . mirtazapine (REMERON) tablet 45 mg  45 mg Oral QHS [name], MD   45 mg at 11/06/20 2130  . multivitamin with minerals tablet 1 tablet  1 tablet Oral Daily [name], MD   1 tablet at 11/07/20 0817  . naltrexone (DEPADE) tablet 50 mg  50 mg Oral Daily [name], MD   50 mg at 11/07/20 0820  . nicotine polacrilex (NICORETTE) gum 2 mg  2 mg Oral Q4H PRN [name], MD   2 mg at 11/06/20 2130  . ondansetron (ZOFRAN) tablet 8 mg  8 mg Oral Q8H PRN [name], MD   8 mg at 11/05/20 0801  . polyethylene glycol (MIRALAX / GLYCOLAX) packet 17 g  17 g Oral Once [name], MD      . thiamine tablet 100 mg  100 mg Oral Daily [name], [name], MD   100 mg at 11/07/20 8841   Or  . thiamine (B-1) injection 100 mg  100 mg Intravenous Daily [name], [name], MD        Lab Results: No results found for this or any previous visit (from the past 48 hour(s)).  Blood Alcohol level:  Lab Results  Component Value Date   ETH [facility]) 10/30/2020   ETH <10 07/31/2020    Metabolic Disorder Labs: Lab Results  Component Value Date   HGBA1C 5.7 (H) 05/30/2020   No results found for: PROLACTIN Lab Results  Component Value Date   CHOL 206 (H) 05/30/2020   TRIG 187 (H) 05/30/2020   HDL 28 (L) 05/30/2020   CHOLHDL 7.4 (H) 05/30/2020   LDLCALC 144 (H) 05/30/2020   LDLCALC 98 06/28/2013    Physical Findings: AIMS:  , ,  ,  ,    CIWA:  CIWA-Ar Total: 0 COWS:     Musculoskeletal: Strength & Muscle Tone: within normal limits Gait & Station: unsteady Patient leans: Front  Psychiatric Specialty Exam:  Presentation  General Appearance: Casual  Eye Contact:Fair Speech:Normal  rate Speech Volume:Normal  Handedness:Right   Mood and Affect  Mood:Depressed; Dysphoric  Affect:Anxious  Thought Process  Thought Processes:Goal Directed  Descriptions of Associations:Intact  Orientation:Full (Time, Place and Person)  Thought Content:Rumination  History of Schizophrenia/Schizoaffective disorder:No  Duration of Psychotic Symptoms: Hallucinations:None Ideas of Reference:None  Suicidal  Thoughts:Yes, passive without access to means Homicidal Thoughts:Denies  Sensorium  Memory:Immediate Fair; Recent Fair; Remote Fair  Judgment:Intact  Insight:Present   Executive Functions  Concentration:Fair Attention Span:Fair Recall:Fair  Fund of Knowledge:Fair  Language:Fair   Psychomotor Activity  Psychomotor Activity:Decreased  Assets  Assets:Communication Skills; Desire for Improvement; Resilience   Sleep  Sleep:Fair, 7.25 hours   Physical Exam: Physical Exam  ROS  Blood pressure 120/78, pulse 79, temperature 98.3 F (36.8 C), resp. rate 17, height 5\' 9"  (1.753 m), weight 81.6 kg, SpO2 98 %. Body mass index is 26.58 kg/m.   Treatment Plan Summary: Daily contact with patient to assess and evaluate symptoms and progress in treatment and Medication management   1)MDD, recurrent, severe without psychotic features- unstable. Continues to endorse passive suicidal ideations, continues to lack hope for the future.  - Continue Remeron 45 mg QHS (increased yesterday) - Continue Wellbutrin XL 150 mg daily   2) Alcohol Use Disorder, severe with acute alcohol withdrawals - CIWA protocol completed, continue MVI, thiamine, folic acide - Continue Naltrexone 50 mg daily - Referrals for inpatient substance abuse treatment completed and sent by CSW  11/07/20: Psychiatric exam above reviewed and remains accurate. Assessment and plan above reviewed and updated.     11/09/20, MD 11/07/2020, 12:51 PM

## 2020-11-07 NOTE — [facility] Group Notes (Signed)
LCSW Group Therapy Note  11/07/2020 2:22 PM  Type of Therapy/Topic:  Group Therapy:  Emotion Regulation  Participation Level:  Active   Description of Group:   The purpose of this group is to assist patients in learning to regulate negative emotions and experience positive emotions. Patients will be guided to discuss ways in which they have been vulnerable to their negative emotions. These vulnerabilities will be juxtaposed with experiences of positive emotions or situations, and patients will be challenged to use positive emotions to combat negative ones. Special emphasis will be placed on coping with negative emotions in conflict situations, and patients will process healthy conflict resolution skills.  Therapeutic Goals: 1. Patient will identify two positive emotions or experiences to reflect on in order to balance out negative emotions 2. Patient will label two or more emotions that they find the most difficult to experience 3. Patient will demonstrate positive conflict resolution skills through discussion and/or role plays  Summary of Patient Progress: Patient was present for the entirety of the group session. Patient was an active listener and participated in the topic of discussion, provided helpful advice to others, and added nuance to topic of conversation.    Therapeutic Modalities:   Cognitive Behavioral Therapy Feelings Identification Dialectical Behavioral Therapy  Gwenevere Ghazi, MSW, [place], [place] 11/07/2020 2:22 PM

## 2020-11-07 NOTE — BHH Counselor (Addendum)
CSW contacted [facility] [phone]) regarding pt referral. Receipt of referral was confirmed. Marcelline Deist stated that they are requesting updated progress notes on his medical stability.   Updated information was faxed to them.  Vilma Meckel. [name], MSW, LCSW, LCAS 11/07/2020 3:31 PM

## 2020-11-07 NOTE — Progress Notes (Signed)
Recreation Therapy Notes  Date: 11/07/2020  Time: 9:30 am  Location: Courtyard   Behavioral response: Appropriate   Intervention Topic: Social-Skills   Discussion/Intervention:  Group content on today was focused on social skills. The group defined social skills and identified ways they use social skills. Patients expressed what obstacles they face when trying to be social. Participants described the importance of social skills. The group listed ways to improve social skills and reasons to improve social skills. Individuals had an opportunity to learn new and improve social skills as well as identify their weaknesses. Clinical Observations/Feedback:  Patient came to group late due to unknown reasons. Individual was social with peers and staff while participating in the intervention.   Anayansi Rundquist LRT/CTRS         Camdyn Beske 11/07/2020 10:43 AM

## 2020-11-07 NOTE — Tx Team (Signed)
Interdisciplinary Treatment and Diagnostic Plan Update  11/07/2020 Time of Session: 0900 Lee Sparks MRN: [id number]  Principal Diagnosis: Severe recurrent major depression without psychotic features [facility] [place])  Secondary Diagnoses: Principal Problem:   Severe recurrent major depression without psychotic features (HCC) Active Problems:   Hypertension   Alcohol withdrawal syndrome (HCC)   Severe alcohol use disorder (HCC)   Current Medications:  Current Facility-Administered Medications  Medication Dose Route Frequency Provider Last Rate Last Admin  . acetaminophen (TYLENOL) tablet 650 mg  650 mg Oral Q6H PRN [name], [name], MD   650 mg at 11/03/20 0853  . alum & mag hydroxide-simeth (MAALOX/MYLANTA) 200-200-20 MG/5ML suspension 30 mL  30 mL Oral Q4H PRN [name], [name], MD   30 mL at 11/06/20 1734  . buPROPion (WELLBUTRIN XL) 24 hr tablet 150 mg  150 mg Oral Daily [name], MD   150 mg at 11/07/20 0817  . feeding supplement (ENSURE ENLIVE / ENSURE PLUS) liquid 237 mL  237 mL Oral BID BM [name], MD   237 mL at 11/06/20 1523  . folic acid (FOLVITE) tablet 1 mg  1 mg Oral Daily [name], MD   1 mg at 11/07/20 0817  . hydrOXYzine (ATARAX/VISTARIL) tablet 50 mg  50 mg Oral TID PRN [name], MD   50 mg at 11/07/20 0817  . magnesium hydroxide (MILK OF MAGNESIA) suspension 30 mL  30 mL Oral Daily PRN [name], [name], MD      . mirtazapine (REMERON) tablet 45 mg  45 mg Oral QHS [name], MD   45 mg at 11/06/20 2130  . multivitamin with minerals tablet 1 tablet  1 tablet Oral Daily [name], MD   1 tablet at 11/07/20 0817  . naltrexone (DEPADE) tablet 50 mg  50 mg Oral Daily [name], MD   50 mg at 11/07/20 0820  . nicotine polacrilex (NICORETTE) gum 2 mg  2 mg Oral Q4H PRN [name], MD   2 mg at 11/06/20 2130  . ondansetron (ZOFRAN) tablet 8 mg  8 mg Oral Q8H PRN [name], MD   8 mg at 11/05/20 0801  . polyethylene glycol  (MIRALAX / GLYCOLAX) packet 17 g  17 g Oral Once [name], MD      . thiamine tablet 100 mg  100 mg Oral Daily [name], [name], MD   100 mg at 11/07/20 9357   Or  . thiamine (B-1) injection 100 mg  100 mg Intravenous Daily [name], [name], MD      . traZODone (DESYREL) tablet 100 mg  100 mg Oral QHS PRN [name], [name], MD   100 mg at 11/05/20 2109   PTA Medications: Medications Prior to Admission  Medication Sig Dispense Refill Last Dose  . acetaminophen (TYLENOL) 500 MG tablet Take 2 tablets (1,000 mg total) by mouth every 12 (twelve) hours as needed for moderate pain. 30 tablet 0   . albuterol (PROVENTIL HFA;VENTOLIN HFA) 108 (90 Base) MCG/ACT inhaler Inhale 2 puffs into the lungs every 4 (four) hours as needed for wheezing or shortness of breath. (Patient not taking: Reported on 04/23/2020) 1 Inhaler 1   . cetirizine (ZYRTEC) 5 MG tablet Take 1 tablet (5 mg total) by mouth daily. 30 tablet 0   . lidocaine (LIDODERM) 5 % Place 1 patch onto the skin every 12 (twelve) hours. Remove & Discard patch within 12 hours or as directed by  MD 10 patch 0   . naproxen (NAPROSYN) 500 MG tablet Take 1 tablet (500 mg total) by mouth 2 (two) times daily with a meal. 20 tablet 00     Patient Stressors: Financial difficulties Marital or family conflict Other: homelessness  Patient Strengths: Average or above average intelligence Communication skills  Treatment Modalities: Medication Management, Group therapy, Case management,  1 to 1 session with clinician, Psychoeducation, Recreational therapy.   Physician Treatment Plan for Primary Diagnosis: Severe recurrent major depression without psychotic features (HCC) Long Term Goal(s): Improvement in symptoms so as ready for discharge Improvement in symptoms so as ready for discharge   Short Term Goals: Ability to identify changes in lifestyle to reduce recurrence of condition will improve Ability to verbalize feelings will improve Ability to  disclose and discuss suicidal ideas Ability to demonstrate self-control will improve Ability to identify and develop effective coping behaviors will improve Ability to maintain clinical measurements within normal limits will improve Compliance with prescribed medications will improve Ability to identify triggers associated with substance abuse/mental health issues will improve Ability to identify changes in lifestyle to reduce recurrence of condition will improve Ability to verbalize feelings will improve Ability to disclose and discuss suicidal ideas Ability to demonstrate self-control will improve Ability to identify and develop effective coping behaviors will improve Ability to maintain clinical measurements within normal limits will improve Compliance with prescribed medications will improve Ability to identify triggers associated with substance abuse/mental health issues will improve  Medication Management: Evaluate patient's response, side effects, and tolerance of medication regimen.  Therapeutic Interventions: 1 to 1 sessions, Unit Group sessions and Medication administration.  Evaluation of Outcomes: Progressing  Physician Treatment Plan for Secondary Diagnosis: Principal Problem:   Severe recurrent major depression without psychotic features (HCC) Active Problems:   Hypertension   Alcohol withdrawal syndrome (HCC)   Severe alcohol use disorder (HCC)  Long Term Goal(s): Improvement in symptoms so as ready for discharge Improvement in symptoms so as ready for discharge   Short Term Goals: Ability to identify changes in lifestyle to reduce recurrence of condition will improve Ability to verbalize feelings will improve Ability to disclose and discuss suicidal ideas Ability to demonstrate self-control will improve Ability to identify and develop effective coping behaviors will improve Ability to maintain clinical measurements within normal limits will improve Compliance with  prescribed medications will improve Ability to identify triggers associated with substance abuse/mental health issues will improve Ability to identify changes in lifestyle to reduce recurrence of condition will improve Ability to verbalize feelings will improve Ability to disclose and discuss suicidal ideas Ability to demonstrate self-control will improve Ability to identify and develop effective coping behaviors will improve Ability to maintain clinical measurements within normal limits will improve Compliance with prescribed medications will improve Ability to identify triggers associated with substance abuse/mental health issues will improve     Medication Management: Evaluate patient's response, side effects, and tolerance of medication regimen.  Therapeutic Interventions: 1 to 1 sessions, Unit Group sessions and Medication administration.  Evaluation of Outcomes: Progressing   RN Treatment Plan for Primary Diagnosis: Severe recurrent major depression without psychotic features (HCC) Long Term Goal(s): Knowledge of disease and therapeutic regimen to maintain health will improve  Short Term Goals: Ability to verbalize frustration and anger appropriately will improve, Ability to participate in decision making will improve, Ability to verbalize feelings will improve, Ability to identify and develop effective coping behaviors will improve and Compliance with prescribed medications will improve  Medication Management: RN  will administer medications as ordered by provider, will assess and evaluate patient's response and provide education to patient for prescribed medication. RN will report any adverse and/or side effects to prescribing provider.  Therapeutic Interventions: 1 on 1 counseling sessions, Psychoeducation, Medication administration, Evaluate responses to treatment, Monitor vital signs and CBGs as ordered, Perform/monitor CIWA, COWS, AIMS and Fall Risk screenings as ordered, Perform  wound care treatments as ordered.  Evaluation of Outcomes: Progressing   LCSW Treatment Plan for Primary Diagnosis: Severe recurrent major depression without psychotic features (HCC) Long Term Goal(s): Safe transition to appropriate next level of care at discharge, Engage patient in therapeutic group addressing interpersonal concerns.  Short Term Goals: Engage patient in aftercare planning with referrals and resources, Increase social support, Increase ability to appropriately verbalize feelings, Increase emotional regulation, Facilitate acceptance of mental health diagnosis and concerns, Facilitate patient progression through stages of change regarding substance use diagnoses and concerns, Identify triggers associated with mental health/substance abuse issues and Increase skills for wellness and recovery  Therapeutic Interventions: Assess for all discharge needs, 1 to 1 time with Social worker, Explore available resources and support systems, Assess for adequacy in community support network, Educate family and significant other(s) on suicide prevention, Complete Psychosocial Assessment, Interpersonal group therapy.  Evaluation of Outcomes: Progressing   Progress in Treatment: Attending groups: Yes. Participating in groups: Yes. Taking medication as prescribed: Yes. Toleration medication: Yes. Family/Significant other contact made: No, will contact:  Patient declined  Patient understands diagnosis: Yes. Discussing patient identified problems/goals with staff: Yes. Medical problems stabilized or resolved: Yes. Denies suicidal/homicidal ideation: No. Issues/concerns per patient self-inventory: Yes. Other: none   New problem(s) identified: Yes, Describe:  Alcohol detox and Suicidal Idiation Update 11/07/20: no change change.  New Short Term/Long Term Goal(s): detox, elimination of symptoms of psychosis, medication management for mood stabilization; elimination of SI thoughts; development of  comprehensive mental wellness/sobriety plan. Update 11/07/20: no significant issues.   Patient Goals:  Patient unable to attend due to grogginess and fatigue. CSW will meet with patient at bedside. Update 11/07/20: no change   Discharge Plan or Barriers: none reported at this time. Update 11/07/20: referrals sent to substance use tx, under review for bed at [facility]   Reason for Continuation of Hospitalization: Medication stabilization Suicidal ideation Withdrawal symptoms  Estimated Length of Stay: 2-7 days  Recreational Therapy: Patient Stressors: N/A Patient Goal: Patient will engage in groups without prompting or encouragement from LRT x3 group sessions within 5 recreation therapy group sessions   Attendees: Patient: Lee Sparks 11/07/2020 9:08 AM  Physician: Les Pou, MD 11/07/2020 9:08 AM  Nursing: Torrie Mayers, RN  11/07/2020 9:08 AM  RN Care Manager: 11/07/2020 9:08 AM  Social Worker: Gwenevere Ghazi, MSW, [place], LCASA  11/07/2020 9:08 AM  Recreational Therapist: Hilbert Bible, LRT  11/07/2020 9:08 AM  Other: Kiva [place], LCSWA 11/07/2020 9:08 AM  Other: Jillyn Hidden, MSW, LCSW, LCAS  11/07/2020 9:08 AM  Other: 11/07/2020 9:08 AM    Scribe for Treatment Team: Corky Crafts, LCSWA 11/07/2020 9:08 AM

## 2020-11-07 NOTE — Plan of Care (Signed)
Pt rates depression 9/10, hopelessness 8/10 and anxiety 9/10. Pt denies HI and VH. Pt has passive SI with no plan and contracts for safety. Pt endorses AH. Pt was educated on care plan and verbalizes understanding. Torrie Mayers RN Problem: Education: Goal: Utilization of techniques to improve thought processes will improve Outcome: Progressing Goal: Knowledge of the prescribed therapeutic regimen will improve Outcome: Progressing   Problem: Activity: Goal: Interest or engagement in leisure activities will improve Outcome: Progressing Goal: Imbalance in normal sleep/wake cycle will improve Outcome: Progressing   Problem: Coping: Goal: Coping ability will improve Outcome: Progressing Goal: Will verbalize feelings Outcome: Progressing   Problem: Health Behavior/Discharge Planning: Goal: Ability to make decisions will improve Outcome: Progressing Goal: Compliance with therapeutic regimen will improve Outcome: Progressing   Problem: Role Relationship: Goal: Will demonstrate positive changes in social behaviors and relationships Outcome: Progressing   Problem: Safety: Goal: Ability to disclose and discuss suicidal ideas will improve Outcome: Progressing Goal: Ability to identify and utilize support systems that promote safety will improve Outcome: Progressing   Problem: Self-Concept: Goal: Will verbalize positive feelings about self Outcome: Progressing Goal: Level of anxiety will decrease Outcome: Progressing   Problem: Education: Goal: Knowledge of [facility] Education information/materials will improve Outcome: Progressing Goal: Emotional status will improve Outcome: Progressing Goal: Mental status will improve Outcome: Progressing Goal: Verbalization of understanding the information provided will improve Outcome: Progressing   Problem: Activity: Goal: Interest or engagement in activities will improve Outcome: Progressing Goal: Sleeping patterns will  improve Outcome: Progressing   Problem: Coping: Goal: Ability to verbalize frustrations and anger appropriately will improve Outcome: Progressing Goal: Ability to demonstrate self-control will improve Outcome: Progressing   Problem: Health Behavior/Discharge Planning: Goal: Identification of resources available to assist in meeting health care needs will improve Outcome: Progressing Goal: Compliance with treatment plan for underlying cause of condition will improve Outcome: Progressing   Problem: Physical Regulation: Goal: Ability to maintain clinical measurements within normal limits will improve Outcome: Progressing   Problem: Safety: Goal: Periods of time without injury will increase Outcome: Progressing   Problem: Education: Goal: Ability to state activities that reduce stress will improve Outcome: Progressing   Problem: Coping: Goal: Ability to identify and develop effective coping behavior will improve Outcome: Progressing   Problem: Self-Concept: Goal: Ability to identify factors that promote anxiety will improve Outcome: Progressing Goal: Level of anxiety will decrease Outcome: Progressing Goal: Ability to modify response to factors that promote anxiety will improve Outcome: Progressing   Problem: Education: Goal: Ability to make informed decisions regarding treatment will improve Outcome: Progressing   Problem: Coping: Goal: Coping ability will improve Outcome: Progressing   Problem: Health Behavior/Discharge Planning: Goal: Identification of resources available to assist in meeting health care needs will improve Outcome: Progressing   Problem: Medication: Goal: Compliance with prescribed medication regimen will improve Outcome: Progressing   Problem: Self-Concept: Goal: Ability to disclose and discuss suicidal ideas will improve Outcome: Progressing Goal: Will verbalize positive feelings about self Outcome: Progressing

## 2020-11-07 NOTE — Progress Notes (Signed)
Pt received PRNs today for anxiety and nicotine withdrawal. Pt has been social in dayroom, groups and outdoors. Pt has been cooperative and med compliant. Torrie Mayers RN

## 2020-11-08 ENCOUNTER — Other Ambulatory Visit: Payer: Self-pay

## 2020-11-08 MED ORDER — BUPROPION HCL ER (XL) 150 MG PO TB24
150.0000 mg | ORAL_TABLET | Freq: Every day | ORAL | 0 refills | Status: DC
  Filled 2020-11-08: qty 7, 7d supply, fill #0

## 2020-11-08 MED ORDER — MIRTAZAPINE 45 MG PO TABS: 45.0000 mg | ORAL_TABLET | Freq: Every day | ORAL | 1 refills | Status: DC

## 2020-11-08 MED ORDER — NALTREXONE HCL 50 MG PO TABS
50.0000 mg | ORAL_TABLET | Freq: Every day | ORAL | 0 refills | Status: DC
  Filled 2020-11-08: qty 7, 7d supply, fill #0

## 2020-11-08 MED ORDER — THIAMINE HCL 100 MG PO TABS: 100.0000 mg | ORAL_TABLET | Freq: Every day | ORAL | 1 refills | Status: DC

## 2020-11-08 MED ORDER — HYDROXYZINE HCL 50 MG PO TABS
50.0000 mg | ORAL_TABLET | Freq: Four times a day (QID) | ORAL | 0 refills | Status: DC | PRN
  Filled 2020-11-08: qty 28, 7d supply, fill #0

## 2020-11-08 MED ORDER — NALTREXONE HCL 50 MG PO TABS: 50.0000 mg | ORAL_TABLET | Freq: Every day | ORAL | 1 refills | Status: DC

## 2020-11-08 MED ORDER — VITAMIN B-1 100 MG PO TABS
100.0000 mg | ORAL_TABLET | Freq: Every day | ORAL | 0 refills | Status: DC
  Filled 2020-11-08: qty 7, 7d supply, fill #0

## 2020-11-08 MED ORDER — HYDROXYZINE HCL 50 MG PO TABS: 50.0000 mg | ORAL_TABLET | Freq: Four times a day (QID) | ORAL | 1 refills | Status: DC | PRN

## 2020-11-08 MED ORDER — THIAMINE HCL 100 MG PO TABS
100.0000 mg | ORAL_TABLET | Freq: Every day | ORAL | 0 refills | Status: DC
  Filled 2020-11-08: qty 7, 7d supply, fill #0

## 2020-11-08 MED ORDER — MIRTAZAPINE 45 MG PO TABS
45.0000 mg | ORAL_TABLET | Freq: Every day | ORAL | 0 refills | Status: DC
  Filled 2020-11-08: qty 7, 7d supply, fill #0

## 2020-11-08 MED ORDER — BUPROPION HCL ER (XL) 150 MG PO TB24: 150.0000 mg | ORAL_TABLET | Freq: Every day | ORAL | 1 refills | Status: DC

## 2020-11-08 NOTE — Discharge Summary (Signed)
Physician Discharge Summary Note  Patient:  Lee Sparks is an 50 y.o., male MRN:  [id number] DOB:  08-28-1970 Patient phone:  [phone] (home)  Patient address:   [facility] [place] [address],  Total Time spent with patient: 35 minutes- 25 minutes face-to-face contact with patient, 10 minutes documentation, coordination of care, scripts   Date of Admission:  11/01/2020 Date of Discharge: 11/09/2020  Reason for Admission:  Patient is a 51 year old male with alcohol use disorder and major depressive disorder presenting for suicidal ideations with plan to drink himself to death.  Principal Problem: Severe recurrent major depression without psychotic features [facility]) Discharge Diagnoses: Principal Problem:   Severe recurrent major depression without psychotic features (HCC) Active Problems:   Hypertension   Severe alcohol use disorder (HCC)   Past Psychiatric History: Has been to [facility] in the past for detox. No history of DTs or seizures from alcohol withdrawals, but he does experience tremors. No history of treatment for depression. No current outpatient provider  Past Medical History:  Past Medical History:  Diagnosis Date  . Alcohol-induced pancreatitis   . Anemia   . Bronchitis     Past Surgical History:  Procedure Laterality Date  . CYSTECTOMY    . ESOPHAGOGASTRODUODENOSCOPY  07/28/2011   Procedure: ESOPHAGOGASTRODUODENOSCOPY (EGD);  Surgeon: Freddy Jaksch, MD;  Location: [facility] ENDOSCOPY;  Service: Endoscopy;  Laterality: N/A;  . HERNIA REPAIR     Family History:  Family History  Problem Relation Age of Onset  . Hypertension Father   . Aneurysm Father   . Cancer Other   . Hypertension Mother   . Pneumonia Mother    Family Psychiatric  History: None reported Social History:  Social History   Substance and Sexual Activity  Alcohol Use Yes  . Alcohol/week: 50.0 standard drinks  . Types: 50 Cans of beer per week   Comment: Equivalent of two 40 oz beers daily.      Social History   Substance and Sexual Activity  Drug Use No    Social History   Socioeconomic History  . Marital status: Single    Spouse name: Not on file  . Number of children: Not on file  . Years of education: Not on file  . Highest education level: Not on file  Occupational History  . Not on file  Tobacco Use  . Smoking status: Current Every Day Smoker    Packs/day: 0.50    Years: 15.00    Pack years: 7.50    Types: Cigarettes    Last attempt to quit: 06/27/2011    Years since quitting: 9.3  . Smokeless tobacco: Never Used  Substance and Sexual Activity  . Alcohol use: Yes    Alcohol/week: 50.0 standard drinks    Types: 50 Cans of beer per week    Comment: Equivalent of two 40 oz beers daily.  . Drug use: No  . Sexual activity: Yes  Other Topics Concern  . Not on file  Social History Narrative  . Not on file   Social Determinants of Health   Financial Resource Strain: Not on file  Food Insecurity: Not on file  Transportation Needs: Not on file  Physical Activity: Not on file  Stress: Not on file  Social Connections: Not on file    Hospital Course:  Patient is a 50 year old male with alcohol use disorder and major depressive disorder presenting for suicidal ideations with plan to drink himself to death. While here patient completed acute alcohol detox with PRN  Ativan per CIWA protocol without complications. He was started on Naltrexone 50 mg daily for alcohol use disorder. He was also started on Remeron and titrated to 45 mg nightly, and wellbutrin XL 150 mg daily for anxiety and depression. He was given vistaril PRN for anxiety and sleep. While here his mood gradually improved. He came out of his room more, attended groups, and interacted well with peers and staff. At time of discharge he denies suicidal ideations, homicidal ideations, visual hallucinations, and auditory hallucinations. He plans to return to his brother's home at discharge, and follow-up at [facility]  for outpatient substance abuse treatments. He also plans to attend AA meetings.   Physical Findings: AIMS:  , ,  ,  ,    CIWA:  CIWA-Ar Total: 0 COWS:     Musculoskeletal: Strength & Muscle Tone: within normal limits Gait & Station: normal Patient leans: N/A  Psychiatric Specialty Exam: General Appearance: Casual  Eye Contact::  Good  Speech:  Clear and Coherent and Normal Rate  Volume:  Normal  Mood:  Euthymic  Affect:  Congruent  Thought Process:  Coherent and Linear  Orientation:  Full (Time, Place, and Person)  Thought Content:  Logical  Suicidal Thoughts:  No  Homicidal Thoughts:  No  Memory:  Immediate;   Fair Recent;   Fair Remote;   Fair  Judgement:  Intact  Insight:  Fair  Psychomotor Activity:  Normal  Concentration:  Fair  Recall:  [employer] of Knowledge:Fair  Language: Fair  Akathisia:  Negative  Handed:  Right  AIMS (if indicated):     Assets:  Communication Skills Desire for Improvement Housing Physical Health Resilience Social Support Talents/Skills Transportation Vocational/Educational  Sleep:  Number of Hours: 7  Cognition: WNL  ADL's:  Intact     Physical Exam: Physical Exam Vitals and nursing note reviewed.  Constitutional:      Appearance: Normal appearance.  HENT:     Head: Normocephalic and atraumatic.     Right Ear: External ear normal.     Left Ear: External ear normal.     Nose: Nose normal.     Mouth/Throat:     Mouth: Mucous membranes are moist.     Pharynx: Oropharynx is clear.  Eyes:     Extraocular Movements: Extraocular movements intact.     Conjunctiva/sclera: Conjunctivae normal.     Pupils: Pupils are equal, round, and reactive to light.  Cardiovascular:     Rate and Rhythm: Normal rate.     Pulses: Normal pulses.  Pulmonary:     Effort: Pulmonary effort is normal.     Breath sounds: Normal breath sounds.  Abdominal:     General: Abdomen is flat.     Palpations: Abdomen is soft.  Musculoskeletal:         General: No swelling. Normal range of motion.     Cervical back: Normal range of motion and neck supple.  Skin:    General: Skin is warm and dry.  Neurological:     General: No focal deficit present.     Mental Status: He is alert and oriented to person, place, and time.  Psychiatric:        Mood and Affect: Mood normal.        Behavior: Behavior normal.        Thought Content: Thought content normal.        Judgment: Judgment normal.    Review of Systems  Constitutional: Negative.   HENT: Negative.  Eyes: Negative.   Respiratory: Negative.   Cardiovascular: Negative.   Gastrointestinal: Negative.   Endocrine: Negative.   Genitourinary: Negative.   Musculoskeletal: Negative.   Skin: Negative.   Allergic/Immunologic: Positive for food allergies. Negative for immunocompromised state.  Neurological: Negative.   Hematological: Negative.   Psychiatric/Behavioral: Negative for behavioral problems, confusion, hallucinations, self-injury and suicidal ideas.  Blood pressure (!) 133/99, pulse 99, temperature 98.1 F (36.7 C), temperature source Oral, resp. rate 18, height 5\' 9"  (1.753 m), weight 81.6 kg, SpO2 100 %. Body mass index is 26.58 kg/m.      Has this patient used any form of tobacco in the last 30 days? (Cigarettes, Smokeless Tobacco, Cigars, and/or Pipes) No  Blood Alcohol level:  Lab Results  Component Value Date   ETH 341 (HH) 10/30/2020   ETH <10 07/31/2020    Metabolic Disorder Labs:  Lab Results  Component Value Date   HGBA1C 5.7 (H) 05/30/2020   No results found for: PROLACTIN Lab Results  Component Value Date   CHOL 206 (H) 05/30/2020   TRIG 187 (H) 05/30/2020   HDL 28 (L) 05/30/2020   CHOLHDL 7.4 (H) 05/30/2020   LDLCALC 144 (H) 05/30/2020   LDLCALC 98 06/28/2013    See Psychiatric Specialty Exam and Suicide Risk Assessment completed by Attending Physician prior to discharge.  Discharge destination:  Other:  Brother's home  Is patient on  multiple antipsychotic therapies at discharge:  No   Has Patient had three or more failed trials of antipsychotic monotherapy by history:  No  Recommended Plan for Multiple Antipsychotic Therapies: NA  Discharge Instructions    Diet - low sodium heart healthy   Complete by: As directed    Increase activity slowly   Complete by: As directed      Allergies as of 11/09/2020      Reactions   Grapefruit Concentrate Hives   Grapefruit Diet Or [extra Strength Grapefruit] Hives   Pt states he has a reaction to grapefruit.       Medication List    STOP taking these medications   lidocaine 5 % Commonly known as: Lidoderm     TAKE these medications     Indication  acetaminophen 500 MG tablet Commonly known as: TYLENOL Take 2 tablets (1,000 mg total) by mouth every 12 (twelve) hours as needed for moderate pain.  Indication: Pain   albuterol 108 (90 Base) MCG/ACT inhaler Commonly known as: VENTOLIN HFA Inhale 2 puffs into the lungs every 4 (four) hours as needed for wheezing or shortness of breath.  Indication: Asthma   buPROPion 150 MG 24 hr tablet Commonly known as: WELLBUTRIN XL Take 1 tablet (150 mg total) by mouth daily.  Indication: Major Depressive Disorder   cetirizine 5 MG tablet Commonly known as: ZYRTEC Take 1 tablet (5 mg total) by mouth daily.  Indication: Hayfever   hydrOXYzine 50 MG tablet Commonly known as: ATARAX/VISTARIL Take 1 tablet (50 mg total) by mouth every 6 (six) hours as needed for anxiety (sleep).  Indication: Feeling Anxious, State of Being Sedated   mirtazapine 45 MG tablet Commonly known as: REMERON Take 1 tablet (45 mg total) by mouth at bedtime.  Indication: Major Depressive Disorder   naltrexone 50 MG tablet Commonly known as: DEPADE Take 1 tablet (50 mg total) by mouth daily.  Indication: Abuse or Misuse of Alcohol   naproxen 500 MG tablet Commonly known as: Naprosyn Take 1 tablet (500 mg total) by mouth 2 (two) times daily with a  meal.  Indication: Pain   thiamine 100 MG tablet Commonly known as: VITAMIN B-1 Take 1 tablet (100 mg total) by mouth daily.  Indication: Deficiency of Vitamin B1       Follow-up Information    [facility] Follow up.   Why: Please present to hospital discharge appointment on 11/12/20 @ 1430 PM. INPERSON. Contact information: [address] [place] [id number] [phone]               Follow-up recommendations:  Activity:  as tolerated Diet:  regular diet  Comments:  7-day supply of free medications provided to patient along with printed 30-day scripts with 1 refill   Signed: Jesse SansMegan M Kalen Neidert, MD 11/09/2020, 9:19 AM

## 2020-11-08 NOTE — Plan of Care (Signed)
  Problem: Group Participation Goal: STG - Patient will engage in interactions with peers and staff in pro-social manner at least 2x within 5 recreation therapy group sessions Description: STG - Patient will engage in interactions with peers and staff in pro-social manner at least 2x within 5 recreation therapy group sessions Outcome: Progressing   

## 2020-11-08 NOTE — Progress Notes (Signed)
Patient active in the day room socializing with other patients.  Pleasant and cooperative. Med compliant. Continues to complain of anxiety, but admits that he is getting better.  Tolerated meds without incident. Monitored with 15 minute safety rounds and encouraged to come to nurses station with any concerns.    Cleo Butler-Nicholson, LPN

## 2020-11-08 NOTE — Progress Notes (Signed)
Pt is alert and oriented to person, place, time and situation. Pt is calm, cooperative, denies suicidal and homicidal ideation, denies hallucinations. Pt is pleasant, spends time watching tv in the dayroom and is social with peers talking to them often. Pt attends groups. Pt smiles on contact. Pt appetite is good, reports he had fair sleep. Pt voices no complaints. Will continue to monitor pt per Q15 minute face checks and monitor for safety and progress.

## 2020-11-08 NOTE — Progress Notes (Signed)
Patient calm and cooperative during assessment. Pt endorses anxiety. Pt more active on the unit tonight than when this writer had him previously during this admission. Patient given education, support, and encouragement to be active in his treatment plan. Pt being monitored Q 15 minutes for safety per unit protocol. Pt remains safe on the unit. Patient observed interacting appropriately with staff and peers on the unit. Patient compliant with medication administration per MD orders.  

## 2020-11-08 NOTE — [facility] Group Notes (Signed)
LCSW Group Therapy Note  11/08/2020 2:08 PM  Type of Therapy/Topic:  Group Therapy:  Balance in Life  Participation Level:  Active  Description of Group:    This group will address the concept of balance and how it feels and looks when one is unbalanced. Patients will be encouraged to process areas in their lives that are out of balance and identify reasons for remaining unbalanced. Facilitators will guide patients in utilizing problem-solving interventions to address and correct the stressor making their life unbalanced. Understanding and applying boundaries will be explored and addressed for obtaining and maintaining a balanced life. Patients will be encouraged to explore ways to assertively make their unbalanced needs known to significant others in their lives, using other group members and facilitator for support and feedback.  Therapeutic Goals: 1. Patient will identify two or more emotions or situations they have that consume much of in their lives. 2. Patient will identify signs/triggers that life has become out of balance:  3. Patient will identify two ways to set boundaries in order to achieve balance in their lives:  4. Patient will demonstrate ability to communicate their needs through discussion and/or role plays  Summary of Patient Progress:  Patient was present for the entirety of group. He was able to identify issues with family as something that causes him to become off balanced. However, he was also able to talk about refocusing on himself and what he needs to do. Patient's comments and feedback were appropriate for the discussion and he appeared receptive to feedback given in group.   Therapeutic Modalities:   Cognitive Behavioral Therapy Solution-Focused Therapy Assertiveness Training  [employer]. [name], MSW, LCSW, LCAS 11/08/2020 2:08 PM

## 2020-11-08 NOTE — [facility] Suicide Risk Assessment (Signed)
[facility] Discharge Suicide Risk Assessment   Principal Problem: Severe recurrent major depression without psychotic features [facility]) Discharge Diagnoses: Principal Problem:   Severe recurrent major depression without psychotic features (HCC) Active Problems:   Hypertension   Severe alcohol use disorder (HCC)   Total Time spent with patient: 35 minutes- 25 minutes face-to-face contact with patient, 10 minutes documentation, coordination of care, scripts   Musculoskeletal: Strength & Muscle Tone: within normal limits Gait & Station: normal Patient leans: N/A  Psychiatric Specialty Exam: Review of Systems  Constitutional: Negative.   HENT: Negative.   Eyes: Negative.   Respiratory: Negative.   Cardiovascular: Negative.   Gastrointestinal: Negative.   Endocrine: Negative.   Genitourinary: Negative.   Musculoskeletal: Negative.   Skin: Negative.   Allergic/Immunologic: Positive for food allergies. Negative for immunocompromised state.  Neurological: Negative.   Hematological: Negative.   Psychiatric/Behavioral: Negative for behavioral problems, confusion, hallucinations, self-injury and suicidal ideas.    Blood pressure (!) 133/99, pulse 99, temperature 98.1 F (36.7 C), temperature source Oral, resp. rate 18, height 5\' 9"  (1.753 m), weight 81.6 kg, SpO2 100 %.Body mass index is 26.58 kg/m.  General Appearance: Casual  Eye Contact::  Good  Speech:  Clear and Coherent and Normal Rate  Volume:  Normal  Mood:  Euthymic  Affect:  Congruent  Thought Process:  Coherent and Linear  Orientation:  Full (Time, Place, and Person)  Thought Content:  Logical  Suicidal Thoughts:  No  Homicidal Thoughts:  No  Memory:  Immediate;   Fair Recent;   Fair Remote;   Fair  Judgement:  Intact  Insight:  Fair  Psychomotor Activity:  Normal  Concentration:  Fair  Recall:  [phone] of Knowledge:Fair  Language: Fair  Akathisia:  Negative  Handed:  Right  AIMS (if indicated):     Assets:   Communication Skills Desire for Improvement Housing Physical Health Resilience Social Support Talents/Skills Transportation Vocational/Educational  Sleep:  Number of Hours: 7  Cognition: WNL  ADL's:  Intact   Mental Status Per Nursing Assessment::   On Admission:  Suicidal ideation indicated by patient,Suicide plan  Demographic Factors:  Male and Caucasian  Loss Factors: NA  Historical Factors: Impulsivity  Risk Reduction Factors:   Sense of responsibility to family, Employed, Living with another person, especially a relative, Positive social support, Positive therapeutic relationship and Positive coping skills or problem solving skills  Continued Clinical Symptoms:  Depression:   Recent sense of peace/wellbeing Alcohol/Substance Abuse/Dependencies Previous Psychiatric Diagnoses and Treatments  Cognitive Features That Contribute To Risk:  None    Suicide Risk:  Minimal: No identifiable suicidal ideation.  Patients presenting with no risk factors but with morbid ruminations; may be classified as minimal risk based on the severity of the depressive symptoms   Follow-up Information    [facility] Follow up.   Why: Please present to hospital discharge appointment on 11/12/20 @ 1430 PM. INPERSON. Contact information: [address] [place] [phone]               Plan Of Care/Follow-up recommendations:  Activity:  as tolerated Diet:  regular diet  [phone], MD 11/09/2020, 9:19 AM

## 2020-11-08 NOTE — Progress Notes (Signed)
Recreation Therapy Notes  Date: 11/08/2020  Time: 9:30 am  Location: Craft Room   Behavioral response: Appropriate   Intervention Topic: Animal Assisted Therapy   Discussion/Intervention:  Animal Assisted Therapy took place today during group.  Animal Assisted Therapy is the planned inclusion of an animal in a patient's treatment plan. The patients were able to engage in therapy with an animal during group. Participants were educated on what a service dog is and the different between a support dog and a service dog. Patient were informed on the many animal needs there are and how their needs are similar. Individuals were enlightened on the process to get a service animal or support animal. Patients got the opportunity to pet the animal and were offered emotional support from the animal and staff.  Clinical Observations/Feedback:  Patient came to group and was on topic and was focused on what peers and staff had to say. Participant shared their experiences and history with animals. Individual was social with peers, staff and animal while participating in group.  Anzlee Hinesley LRT/CTRS         Neliah Cuyler 11/08/2020 12:49 PM

## 2020-11-08 NOTE — Progress Notes (Incomplete)
Pt has been alert and oriented to person, place, time and situation. Pt is calm, cooperative, denies suicidal and homicidal ideation, denies feelings of depression and anxiety at this time. Pt is social with peers talking with them in the dayroom and watching tv. Pt's affect is appropriately bright. Pt attends groups. Pt is medication compliant. Pt is focused on discharge.

## 2020-11-08 NOTE — Progress Notes (Signed)
[facility] MD Progress Note  11/08/2020 10:26 AM Lee Sparks  MRN:  [id number]  CC "Got some good news"  Subjective:  Patient is a 50 year old male with alcohol use disorder and major depressive disorder presenting for suicidal ideations with plan to drink himself to death. No acute events overnight, medication compliant. This morning patient notes he is feeling much better. He denies any suicidal ideations, homicidal ideations, visual hallucinations, or auditory hallucinations. He has been able to speak with his family. He is now able to go stay with his brother starting tomorrow. He notes his brother will assist him attending outpatient substance abuse treatment and AA meetings while he continues to go to work. He now desires this over inpatient treatment due to fear of job loss. Will order 7-day supply of free medications today to prepare for tentative dc to brother tomorrow. Will also discuss with CSW to arrange for proper aftercare.    Principal Problem: Severe recurrent major depression without psychotic features (HCC) Diagnosis: Principal Problem:   Severe recurrent major depression without psychotic features (HCC) Active Problems:   Hypertension   Alcohol withdrawal syndrome (HCC)   Severe alcohol use disorder (HCC)  Total Time spent with patient: 20 minutes  Past Psychiatric History: See H&P  Past Medical History:  Past Medical History:  Diagnosis Date  . Alcohol-induced pancreatitis   . Anemia   . Bronchitis     Past Surgical History:  Procedure Laterality Date  . CYSTECTOMY    . ESOPHAGOGASTRODUODENOSCOPY  07/28/2011   Procedure: ESOPHAGOGASTRODUODENOSCOPY (EGD);  Surgeon: Freddy Jaksch, MD;  Location: [facility] ENDOSCOPY;  Service: Endoscopy;  Laterality: N/A;  . HERNIA REPAIR     Family History:  Family History  Problem Relation Age of Onset  . Hypertension Father   . Aneurysm Father   . Cancer Other   . Hypertension Mother   . Pneumonia Mother    Family Psychiatric   History: See H&P Social History:  Social History   Substance and Sexual Activity  Alcohol Use Yes  . Alcohol/week: 50.0 standard drinks  . Types: 50 Cans of beer per week   Comment: Equivalent of two 40 oz beers daily.     Social History   Substance and Sexual Activity  Drug Use No    Social History   Socioeconomic History  . Marital status: Single    Spouse name: Not on file  . Number of children: Not on file  . Years of education: Not on file  . Highest education level: Not on file  Occupational History  . Not on file  Tobacco Use  . Smoking status: Current Every Day Smoker    Packs/day: 0.50    Years: 15.00    Pack years: 7.50    Types: Cigarettes    Last attempt to quit: 06/27/2011    Years since quitting: 9.3  . Smokeless tobacco: Never Used  Substance and Sexual Activity  . Alcohol use: Yes    Alcohol/week: 50.0 standard drinks    Types: 50 Cans of beer per week    Comment: Equivalent of two 40 oz beers daily.  . Drug use: No  . Sexual activity: Yes  Other Topics Concern  . Not on file  Social History Narrative  . Not on file   Social Determinants of Health   Financial Resource Strain: Not on file  Food Insecurity: Not on file  Transportation Needs: Not on file  Physical Activity: Not on file  Stress: Not on file  Social Connections: Not on file   Additional Social History:     Sleep: Fair  Appetite:  Good  Current Medications: Current Facility-Administered Medications  Medication Dose Route Frequency Provider Last Rate Last Admin  . acetaminophen (TYLENOL) tablet 650 mg  650 mg Oral Q6H PRN [name], [name], MD   650 mg at 11/03/20 0853  . alum & mag hydroxide-simeth (MAALOX/MYLANTA) 200-200-20 MG/5ML suspension 30 mL  30 mL Oral Q4H PRN [name], [name], MD   30 mL at 11/06/20 1734  . buPROPion (WELLBUTRIN XL) 24 hr tablet 150 mg  150 mg Oral Daily [name], MD   150 mg at 11/08/20 0851  . feeding supplement (ENSURE ENLIVE / ENSURE  PLUS) liquid 237 mL  237 mL Oral BID BM [name], MD   237 mL at 11/07/20 1445  . folic acid (FOLVITE) tablet 1 mg  1 mg Oral Daily [name], MD   1 mg at 11/08/20 0804  . hydrOXYzine (ATARAX/VISTARIL) tablet 50 mg  50 mg Oral TID PRN [name], MD   50 mg at 11/07/20 1711  . hydrOXYzine (ATARAX/VISTARIL) tablet 50 mg  50 mg Oral QHS [name], MD   50 mg at 11/07/20 2206  . magnesium hydroxide (MILK OF MAGNESIA) suspension 30 mL  30 mL Oral Daily PRN [name], [name], MD      . mirtazapine (REMERON) tablet 45 mg  45 mg Oral QHS [name], MD   45 mg at 11/07/20 2206  . multivitamin with minerals tablet 1 tablet  1 tablet Oral Daily [name], MD   1 tablet at 11/08/20 0804  . naltrexone (DEPADE) tablet 50 mg  50 mg Oral Daily [name], MD   50 mg at 11/08/20 0805  . nicotine polacrilex (NICORETTE) gum 2 mg  2 mg Oral Q4H PRN [name], MD   2 mg at 11/07/20 1458  . ondansetron (ZOFRAN) tablet 8 mg  8 mg Oral Q8H PRN [name], MD   8 mg at 11/05/20 0801  . polyethylene glycol (MIRALAX / GLYCOLAX) packet 17 g  17 g Oral Once [name], MD      . thiamine tablet 100 mg  100 mg Oral Daily [name], [name], MD   100 mg at 11/08/20 0805   Or  . thiamine (B-1) injection 100 mg  100 mg Intravenous Daily [name], [name], MD        Lab Results: No results found for this or any previous visit (from the past 48 hour(s)).  Blood Alcohol level:  Lab Results  Component Value Date   ETH [facility]) 10/30/2020   ETH <10 07/31/2020    Metabolic Disorder Labs: Lab Results  Component Value Date   HGBA1C 5.7 (H) 05/30/2020   No results found for: PROLACTIN Lab Results  Component Value Date   CHOL 206 (H) 05/30/2020   TRIG 187 (H) 05/30/2020   HDL 28 (L) 05/30/2020   CHOLHDL 7.4 (H) 05/30/2020   LDLCALC 144 (H) 05/30/2020   LDLCALC 98 06/28/2013    Physical Findings: AIMS:  , ,  ,  ,    CIWA:  CIWA-Ar Total: 0 COWS:      Musculoskeletal: Strength & Muscle Tone: within normal limits Gait & Station: unsteady Patient leans: Front  Psychiatric Specialty Exam:  Presentation  General Appearance: Casual  Eye Contact:Fair Speech:Normal rate Speech Volume:Normal  Handedness:Right   Mood and Affect  Mood:Depressed; Dysphoric  Affect:Anxious  Thought Process  Thought Processes:Goal Directed  Descriptions of Associations:Intact  Orientation:Full (Time, Place and Person)  Thought Content:Rumination  History of Schizophrenia/Schizoaffective disorder:No  Duration of Psychotic Symptoms: Hallucinations:None Ideas of Reference:None  Suicidal Thoughts:Denies Homicidal Thoughts:Denies  Sensorium  Memory:Immediate Fair; Recent Fair; Remote Fair  Judgment:Intact  Insight:Present   Executive Functions  Concentration:Fair Attention Span:Fair Recall:Fair  Fund of Knowledge:Fair  Language:Fair   Psychomotor Activity  Psychomotor Activity:Normal  Assets  Assets:Communication Skills; Desire for Improvement; Resilience   Sleep  Sleep:Fair, 7 hours   Physical Exam: Physical Exam  ROS  Blood pressure (!) 147/103, pulse 92, temperature 98.1 F (36.7 C), temperature source Oral, resp. rate 18, height 5\' 9"  (1.753 m), weight 81.6 kg, SpO2 100 %. Body mass index is 26.58 kg/m.   Treatment Plan Summary: Daily contact with patient to assess and evaluate symptoms and progress in treatment and Medication management   1)MDD, recurrent, severe without psychotic features- Improved. No longer endorsing passive SI.  - Continue Remeron 45 mg QHS  - Continue Wellbutrin XL 150 mg daily   2) Alcohol Use Disorder, severe with acute alcohol withdrawals - CIWA protocol completed, continue MVI, thiamine, folic acide - Continue Naltrexone 50 mg daily - Patient now desire outpatient substance abuse treatment. CSW will assist with arranging after care. Tentative dc to family tmw.   11/08/20:  Psychiatric exam above reviewed and remains accurate. Assessment and plan above reviewed and updated.      11/10/20, MD 11/08/2020, 10:26 AM

## 2020-11-08 NOTE — Plan of Care (Signed)
Patient compliant with procedures on the unit   Problem: Health Behavior/Discharge Planning: Goal: Compliance with therapeutic regimen will improve Outcome: Progressing   

## 2020-11-09 ENCOUNTER — Other Ambulatory Visit: Payer: Self-pay

## 2020-11-09 NOTE — Progress Notes (Signed)
Recreation Therapy Notes   Date: 11/09/2020  Time: 9:30 am   Location: Craft room     Behavioral response: N/A   Intervention Topic: Coping-Skills   Discussion/Intervention: Patient did not attend group.   Clinical Observations/Feedback:  Patient did not attend group.   Lee Sparks LRT/CTRS         Lee Sparks 11/09/2020 10:37 AM 

## 2020-11-09 NOTE — Progress Notes (Signed)
Recreation Therapy Notes  INPATIENT RECREATION TR PLAN  Patient Details Name: Lee Sparks MRN: [id number] DOB: 03-29-1971 Today's Date: 11/09/2020  Rec Therapy Plan Is patient appropriate for Therapeutic Recreation?: Yes Treatment times per week: at least 3 Estimated Length of Stay: 5-7 days TR Treatment/Interventions: Group participation (Comment)  Discharge Criteria Pt will be discharged from therapy if:: Discharged Treatment plan/goals/alternatives discussed and agreed upon by:: Patient/family  Discharge Summary Short term goals set: Patient will engage in interactions with peers and staff in pro-social manner at least 2x within 5 recreation therapy group sessions Short term goals met: Adequate for discharge Progress toward goals comments: Groups attended Which groups?: Social skills,AAA/T Reason goals not met: N/A Therapeutic equipment acquired: N/A Reason patient discharged from therapy: Discharge from hospital Pt/family agrees with progress & goals achieved: Yes Date patient discharged from therapy: 11/09/20   Mackey Varricchio 11/09/2020, 11:27 AM

## 2020-11-09 NOTE — Progress Notes (Signed)
Pt was educated on prescriptions and follow up care. Pt questions were answered and pt verbalized understanding and did not voice any concerns. Pt's belongings were returned. Pt was not observed to be in any distress and was safely discharged to [employer].

## 2020-11-09 NOTE — Progress Notes (Signed)
Patient is calm and cooperative with assessment. He remains in good spirits. He denies SI, HI, and AVH. Patient was educated on medications and was receptive to education. Patient appears anxious but declines anxiety medication until 10:19am. Patient remains medication compliant. Patient is interacting appropriately with peers and staff. Support and encouragement provided. Patient remains safe on the unit at this time and q15 min safety checks are maintained.

## 2020-11-09 NOTE — Progress Notes (Signed)
  [facility] Adult Case Management Discharge Plan :  Will you be returning to the same living situation after discharge:  No. At discharge, do you have transportation home?: Yes,  CSW to arrange safe transport. Do you have the ability to pay for your medications: No.  Release of information consent forms completed and in the chart;  Patient's signature needed at discharge.  Patient to Follow up at:  Follow-up Information    [facility] Follow up.   Why: Please present to hospital discharge appointment on 11/12/20 @ 1430 PM. INPERSON. Contact information: [address] [place] [address] [phone]               Next level of care provider has access to [facility] Link:no  Safety Planning and Suicide Prevention discussed: Yes,  SPE completed with the pt.     Has patient been referred to the Quitline?: Patient refused referral  Patient has been referred for addiction treatment: Yes  Glenis Smoker, LCSW 11/09/2020, 10:32 AM

## 2020-11-09 NOTE — Plan of Care (Signed)
  Problem: Group Participation Goal: STG - Patient will engage in interactions with peers and staff in pro-social manner at least 2x within 5 recreation therapy group sessions Description: STG - Patient will engage in interactions with peers and staff in pro-social manner at least 2x within 5 recreation therapy group sessions 11/09/2020 1126 by Alveria Apley, LRT Outcome: Adequate for Discharge 11/09/2020 1126 by Alveria Apley, LRT Outcome: Adequate for Discharge

## 2020-11-15 ENCOUNTER — Telehealth: Payer: Self-pay | Admitting: Gerontology

## 2020-11-15 NOTE — Telephone Encounter (Signed)
Called pt to reschedule appointment. LVM letting patient know about new appointment 05/05 at 7pm

## 2020-11-29 ENCOUNTER — Ambulatory Visit: Payer: Medicaid Other

## 2020-12-19 ENCOUNTER — Other Ambulatory Visit: Payer: Self-pay

## 2020-12-19 ENCOUNTER — Emergency Department
Admission: EM | Admit: 2020-12-19 | Discharge: 2020-12-20 | Disposition: A | Discharge status: Home / Self Care | Payer: 59 | Attending: Emergency Medicine | Admitting: Emergency Medicine

## 2020-12-19 ENCOUNTER — Emergency Department: Payer: Self-pay

## 2020-12-19 DIAGNOSIS — K859 Acute pancreatitis without necrosis or infection, unspecified: Secondary | ICD-10-CM | POA: Diagnosis present

## 2020-12-19 DIAGNOSIS — F1020 Alcohol dependence, uncomplicated: Secondary | ICD-10-CM | POA: Diagnosis present

## 2020-12-19 DIAGNOSIS — F1410 Cocaine abuse, uncomplicated: Secondary | ICD-10-CM | POA: Diagnosis present

## 2020-12-19 DIAGNOSIS — Z20822 Contact with and (suspected) exposure to covid-19: Secondary | ICD-10-CM | POA: Insufficient documentation

## 2020-12-19 DIAGNOSIS — I10 Essential (primary) hypertension: Secondary | ICD-10-CM | POA: Insufficient documentation

## 2020-12-19 DIAGNOSIS — F332 Major depressive disorder, recurrent severe without psychotic features: Secondary | ICD-10-CM | POA: Diagnosis not present

## 2020-12-19 DIAGNOSIS — R45851 Suicidal ideations: Secondary | ICD-10-CM | POA: Insufficient documentation

## 2020-12-19 DIAGNOSIS — F1010 Alcohol abuse, uncomplicated: Secondary | ICD-10-CM

## 2020-12-19 DIAGNOSIS — R0789 Other chest pain: Secondary | ICD-10-CM | POA: Insufficient documentation

## 2020-12-19 DIAGNOSIS — F17210 Nicotine dependence, cigarettes, uncomplicated: Secondary | ICD-10-CM | POA: Insufficient documentation

## 2020-12-19 DIAGNOSIS — R0602 Shortness of breath: Secondary | ICD-10-CM | POA: Insufficient documentation

## 2020-12-19 LAB — COMPREHENSIVE METABOLIC PANEL
ALT: 48 U/L — ABNORMAL HIGH (ref 0–44)
AST: 48 U/L — ABNORMAL HIGH (ref 15–41)
Albumin: 4.3 g/dL (ref 3.5–5.0)
Alkaline Phosphatase: 37 U/L — ABNORMAL LOW (ref 38–126)
Anion gap: 10 (ref 5–15)
BUN: 9 mg/dL (ref 6–20)
CO2: 24 mmol/L (ref 22–32)
Calcium: 9.1 mg/dL (ref 8.9–10.3)
Chloride: 102 mmol/L (ref 98–111)
Creatinine, Ser: 0.67 mg/dL (ref 0.61–1.24)
GFR, Estimated: >60 mL/min (ref >60)
Glucose, Bld: 114 mg/dL — ABNORMAL HIGH (ref 70–99)
Potassium: 3.5 mmol/L (ref 3.5–5.1)
Sodium: 136 mmol/L (ref 135–145)
Total Bilirubin: 1.3 mg/dL — ABNORMAL HIGH (ref 0.3–1.2)
Total Protein: 7.5 g/dL (ref 6.5–8.1)

## 2020-12-19 LAB — URINE DRUG SCREEN, QUALITATIVE (ARMC ONLY)
Amphetamines, Ur Screen: NOT DETECTED
Barbiturates, Ur Screen: NOT DETECTED
Benzodiazepine, Ur Scrn: NOT DETECTED
Cannabinoid 50 Ng, Ur ~~LOC~~: NOT DETECTED
Cocaine Metabolite,Ur ~~LOC~~: NOT DETECTED
MDMA (Ecstasy)Ur Screen: NOT DETECTED
Methadone Scn, Ur: NOT DETECTED
Opiate, Ur Screen: NOT DETECTED
Phencyclidine (PCP) Ur S: NOT DETECTED
Tricyclic, Ur Screen: NOT DETECTED

## 2020-12-19 LAB — TROPONIN I (HIGH SENSITIVITY): Troponin I (High Sensitivity): 6 ng/L (ref <18)

## 2020-12-19 LAB — CBC WITH DIFFERENTIAL/PLATELET
Abs Immature Granulocytes: 0.04 10*3/uL (ref 0.00–0.07)
Basophils Absolute: 0 10*3/uL (ref 0.0–0.1)
Basophils Relative: 1 %
Eosinophils Absolute: 0.1 10*3/uL (ref 0.0–0.5)
Eosinophils Relative: 1 %
HCT: 42.8 % (ref 39.0–52.0)
Hemoglobin: 14.5 g/dL (ref 13.0–17.0)
Immature Granulocytes: 1 %
Lymphocytes Relative: 23 %
Lymphs Abs: 1.7 10*3/uL (ref 0.7–4.0)
MCH: 29.8 pg (ref 26.0–34.0)
MCHC: 33.9 g/dL (ref 30.0–36.0)
MCV: 88.1 fL (ref 80.0–100.0)
Monocytes Absolute: 0.6 10*3/uL (ref 0.1–1.0)
Monocytes Relative: 8 %
Neutro Abs: 4.8 10*3/uL (ref 1.7–7.7)
Neutrophils Relative %: 66 %
Platelets: 152 10*3/uL (ref 150–400)
RBC: 4.86 MIL/uL (ref 4.22–5.81)
RDW: 14.2 % (ref 11.5–15.5)
WBC: 7.2 10*3/uL (ref 4.0–10.5)
nRBC: 0 % (ref 0.0–0.2)

## 2020-12-19 LAB — RESP PANEL BY RT-PCR (FLU A&B, COVID) ARPGX2
Influenza A by PCR: NEGATIVE
Influenza B by PCR: NEGATIVE
SARS Coronavirus 2 by RT PCR: NEGATIVE

## 2020-12-19 LAB — SALICYLATE LEVEL: Salicylate Lvl: <7 mg/dL — ABNORMAL LOW (ref 7.0–30.0)

## 2020-12-19 LAB — ETHANOL: Alcohol, Ethyl (B): <10 mg/dL (ref <10)

## 2020-12-19 LAB — ACETAMINOPHEN LEVEL: Acetaminophen (Tylenol), Serum: <10 ug/mL — ABNORMAL LOW (ref 10–30)

## 2020-12-19 MED ORDER — LORAZEPAM 2 MG/ML IJ SOLN: 0.0000 mg | Freq: Four times a day (QID) | INTRAMUSCULAR | Status: DC

## 2020-12-19 MED ORDER — MIRTAZAPINE 15 MG PO TABS
45.0000 mg | ORAL_TABLET | Freq: Every day | ORAL | Status: DC
Start: 2020-12-19 — End: 2020-12-20
  Administered 2020-12-19: 45 mg via ORAL
  Filled 2020-12-19: qty 3

## 2020-12-19 MED ORDER — NALTREXONE HCL 50 MG PO TABS
50.0000 mg | ORAL_TABLET | Freq: Every day | ORAL | Status: DC
  Filled 2020-12-19 (×2): qty 1

## 2020-12-19 MED ORDER — LORATADINE 10 MG PO TABS
10.0000 mg | ORAL_TABLET | Freq: Every day | ORAL | Status: DC
  Administered 2020-12-19: 10 mg via ORAL
  Filled 2020-12-19: qty 1

## 2020-12-19 MED ORDER — LORAZEPAM 2 MG PO TABS
0.0000 mg | ORAL_TABLET | Freq: Two times a day (BID) | ORAL | Status: DC
Start: 2020-12-21 — End: 2020-12-20

## 2020-12-19 MED ORDER — THIAMINE HCL 100 MG/ML IJ SOLN
100.0000 mg | Freq: Every day | INTRAMUSCULAR | Status: DC
  Filled 2020-12-19: qty 1

## 2020-12-19 MED ORDER — ALBUTEROL SULFATE (2.5 MG/3ML) 0.083% IN NEBU
3.0000 mL | INHALATION_SOLUTION | RESPIRATORY_TRACT | Status: DC | PRN
  Filled 2020-12-19: qty 3

## 2020-12-19 MED ORDER — LORAZEPAM 2 MG/ML IJ SOLN: 0.0000 mg | Freq: Two times a day (BID) | INTRAMUSCULAR | Status: DC

## 2020-12-19 MED ORDER — THIAMINE HCL 100 MG PO TABS
100.0000 mg | ORAL_TABLET | Freq: Every day | ORAL | Status: DC
  Administered 2020-12-19: 100 mg via ORAL
  Filled 2020-12-19: qty 1

## 2020-12-19 MED ORDER — BUPROPION HCL ER (XL) 150 MG PO TB24
150.0000 mg | ORAL_TABLET | Freq: Every day | ORAL | Status: DC
  Administered 2020-12-19: 150 mg via ORAL
  Filled 2020-12-19: qty 1

## 2020-12-19 MED ORDER — LORAZEPAM 2 MG PO TABS
0.0000 mg | ORAL_TABLET | Freq: Four times a day (QID) | ORAL | Status: DC
Start: 2020-12-19 — End: 2020-12-20
  Administered 2020-12-19 (×3): 1 mg via ORAL
  Filled 2020-12-19 (×3): qty 1

## 2020-12-19 NOTE — BH Assessment (Signed)
Comprehensive Clinical Assessment (CCA) Note  12/19/2020 Lee Sparks [id number]  Chief Complaint: Patient is a 50 year old male presenting to [facility] ED voluntarily. Per triage note Patient to [facility] ambulatory with EMS from home.  Per EMS patient seeking help with alcohol abuse and having suicidal thoughts.  Patient reports normally drinks a 15 pk/day, last drink Tuesday morning, reports shortly after last drink suicidal ideations started. During assessment patient appears alert and oriented x4, calm and cooperative. Patient reports "I'm suicidal." Patient was just recently admitted to Inpatient treatment in 10/2020 and was referred to Outpatient treatment at [facility], patient reports that he did not follow-up and went back to drinking. Patient reports "I'm having issues with my daughter and brother" and reports that he recently lost his job 2 weeks ago. Patient reports continued SI with a plan "cutting my arms." Patient denies HI/AH/VH and does not appear to be responding to any internal or external stimuli.   Per Psyc NP Elenore Paddy patient to be reassessed Chief Complaint  Patient presents with  . Psychiatric Evaluation  . Alcohol Intoxication   Visit Diagnosis: Depression, Alcohol Abuse   CCA Screening, Triage and Referral (STR)  Patient Reported Information How did you hear about [place]? Self  Referral name: No data recorded Referral phone number: No data recorded  Whom do you see for routine medical problems? I don't have a doctor  Practice/Facility Name: No data recorded Practice/Facility Phone Number: No data recorded Name of Contact: No data recorded Contact Number: No data recorded Contact Fax Number: No data recorded Prescriber Name: No data recorded Prescriber Address (if known): No data recorded  What Is the Reason for Your Visit/Call Today? Patient presenting to the ER due SI and alcohol abuse  How Long Has This Been Causing You Problems? > than 6 months  What Do You Feel  Would Help You the Most Today? Treatment for Depression or other mood problem   Have You Recently Been in Any Inpatient Treatment (Hospital/Detox/Crisis Center/28-Day Program)? Yes  Name/Location of Program/Hospital:[facility]  How Long Were You There? 8 days  When Were You Discharged? 11/09/2020   Have You Ever Received Services From [employer] Before? Yes  Who Do You See at [facility]? Inpatient treatment   Have You Recently Had Any Thoughts About Hurting Yourself? Yes  Are You Planning to Commit Suicide/Harm Yourself At This time? Yes   Have you Recently Had Thoughts About Hurting Someone Karolee Ohs? No  Explanation: No data recorded  Have You Used Any Alcohol or Drugs in the Past 24 Hours? Yes  How Long Ago Did You Use Drugs or Alcohol? No data recorded What Did You Use and How Much? Alcohol   Do You Currently Have a Therapist/Psychiatrist? No  Name of Therapist/Psychiatrist: No data recorded  Have You Been Recently Discharged From Any Office Practice or Programs? No  Explanation of Discharge From Practice/Program: No data recorded    CCA Screening Triage Referral Assessment Type of Contact: Face-to-Face  Is this Initial or Reassessment? No data recorded Date Telepsych consult ordered in [facility]:  No data recorded Time Telepsych consult ordered in [facility]:  No data recorded  Patient Reported Information Reviewed? Yes  Patient Left Without Being Seen? No data recorded Reason for Not Completing Assessment: No data recorded  Collateral Involvement: None provided   Does Patient Have a Court Appointed Legal Guardian? No data recorded Name and Contact of Legal Guardian: No data recorded If Minor and Not Living with Parent(s), Who has  Custody? No data recorded Is CPS involved or ever been involved? Never  Is APS involved or ever been involved? Never   Patient Determined To Be At Risk for Harm To Self or Others Based on Review of Patient Reported Information or Presenting  Complaint? Yes, for Self-Harm  Method: No data recorded Availability of Means: No data recorded Intent: No data recorded Notification Required: No data recorded Additional Information for Danger to Others Potential: No data recorded Additional Comments for Danger to Others Potential: No data recorded Are There Guns or Other Weapons in Your Home? No data recorded Types of Guns/Weapons: No data recorded Are These Weapons Safely Secured?                            No data recorded Who Could Verify You Are Able To Have These Secured: No data recorded Do You Have any Outstanding Charges, Pending Court Dates, Parole/Probation? No data recorded Contacted To Inform of Risk of Harm To Self or Others: No data recorded  [facility]   Does Patient Present under Involuntary Commitment? No  IVC Papers Initial File Date: 10/31/2020   [place]   Patient Currently Receiving the Following Services: Not Receiving Services   Determination of Need: Emergent (2 hours)   Options For Referral: Outpatient Therapy     CCA Biopsychosocial Intake/Chief Complaint:  Patient presenting voluntarily due to SI with a plan and alcohol abuse  Current Symptoms/Problems: Patient presenting voluntarily due to SI with a plan and alcohol abuse   Patient Reported Schizophrenia/Schizoaffective Diagnosis in Past: No   Strengths: Patient is able to communicate  Preferences: Unknown  Abilities: Patient is able to communicate   Type of Services Patient Feels are Needed: Unknown   Initial Clinical Notes/Concerns: None   Mental Health Symptoms Depression:  Hopelessness; Worthlessness   Duration of Depressive symptoms: Greater than two weeks   Mania:  None   Anxiety:   Worrying   Psychosis:  None   Duration of Psychotic symptoms: No data recorded  Trauma:  None   Obsessions:  None   Compulsions:  None   Inattention:  None    Hyperactivity/Impulsivity:  N/A   Oppositional/Defiant Behaviors:  None   Emotional Irregularity:  None   Other Mood/Personality Symptoms:  No data recorded   Mental Status Exam Appearance and self-care  Stature:  Average   Weight:  Average weight   Clothing:  Casual   Grooming:  Normal   Cosmetic use:  None   Posture/gait:  Normal   Motor activity:  Not Remarkable   Sensorium  Attention:  Normal   Concentration:  Normal   Orientation:  X5   Recall/memory:  Normal   Affect and Mood  Affect:  Appropriate   Mood:  Other (Comment)   Relating  Eye contact:  Normal   Facial expression:  Responsive   Attitude toward examiner:  Cooperative   Thought and Language  Speech flow: Clear and Coherent   Thought content:  Appropriate to Mood and Circumstances   Preoccupation:  None   Hallucinations:  None   Organization:  No data recorded  [employer] Computer ServicesExecutive Functions  Fund of Knowledge:  Fair   Intelligence:  Average   Abstraction:  Concrete   Judgement:  Fair   Reality Testing:  Realistic   Insight:  Good   Decision Making:  Normal   Social Functioning  Social Maturity:  Responsible   Social  Judgement:  Normal   Stress  Stressors:  Housing; Office manager Ability:  Normal   Skill Deficits:  None   Supports:  Support needed     Religion: Religion/Spirituality Are You A Religious Person?: No  Leisure/Recreation: Leisure / Recreation Do You Have Hobbies?: No  Exercise/Diet: Exercise/Diet Do You Exercise?: No Have You Gained or Lost A Significant Amount of Weight in the Past Six Months?: No Do You Follow a Special Diet?: No Do You Have Any Trouble Sleeping?: No   CCA Employment/Education Employment/Work Situation: Employment / Work Psychologist, occupational Employment situation: Unemployed Has patient ever been in the [employer]?: No  Education: Education Is Patient Currently Attending School?: No Did You Have An Individualized Education  Program (IIEP): No Did You Have Any Difficulty At [employer]?: No Patient's Education Has Been Impacted by Current Illness: No   CCA Family/Childhood History Family and Relationship History: Family history Marital status: Single What is your sexual orientation?: Unknown Has your sexual activity been affected by drugs, alcohol, medication, or emotional stress?: Unknown  Childhood History:  Childhood History Additional childhood history information: None reported Description of patient's relationship with caregiver when they were a child: None reported Patient's description of current relationship with people who raised him/her: None reported How were you disciplined when you got in trouble as a child/adolescent?: None reported Did patient suffer any verbal/emotional/physical/sexual abuse as a child?: No Did patient suffer from severe childhood neglect?: No Has patient ever been sexually abused/assaulted/raped as an adolescent or adult?: No Was the patient ever a victim of a crime or a disaster?: No Witnessed domestic violence?: No Has patient been affected by domestic violence as an adult?: No  Child/Adolescent Assessment:     CCA Substance Use Alcohol/Drug Use: Alcohol / Drug Use Pain Medications: See MAR Prescriptions: See MAR Over the Counter: See MAR History of alcohol / drug use?: Yes Withdrawal Symptoms: Change in blood pressure Substance #1 Name of Substance 1: Alcohol 1 - Amount (size/oz): "15 beers" 1 - Frequency: daily 1 - Last Use / Amount: 12/18/20                       ASAM's:  Six Dimensions of Multidimensional Assessment  Dimension 1:  Acute Intoxication and/or Withdrawal Potential:      Dimension 2:  Biomedical Conditions and Complications:      Dimension 3:  Emotional, Behavioral, or Cognitive Conditions and Complications:     Dimension 4:  Readiness to Change:     Dimension 5:  Relapse, Continued use, or Continued Problem Potential:      Dimension 6:  Recovery/Living Environment:     ASAM Severity Score:    ASAM Recommended Level of Treatment:     Substance use Disorder (SUD) Substance Use Disorder (SUD)  Checklist Symptoms of Substance Use: Continued use despite having a persistent/recurrent physical/psychological problem caused/exacerbated by use,Evidence of tolerance,Presence of craving or strong urge to use,Social, occupational, recreational activities given up or reduced due to use,Evidence of withdrawal (Comment),Large amounts of time spent to obtain, use or recover from the substance(s),Recurrent use that results in a failure to fulfill major role obligations (work, school, home),Substance(s) often taken in larger amounts or over longer times than was intended,Continued use despite persistent or recurrent social, interpersonal problems, caused or exacerbated by use,Persistent desire or unsuccessful efforts to cut down or control use,Repeated use in physically hazardous situations  Recommendations for Services/Supports/Treatments:  Reassess  DSM5 Diagnoses: Patient Active Problem List   Diagnosis  Date Noted  . Severe recurrent major depression without psychotic features (HCC) 10/31/2020  . Severe alcohol use disorder (HCC) 10/31/2020  . Pancreatitis 05/03/2017  . Acute pancreatitis 05/01/2017  . Thrombocytopenia (HCC) 07/29/2011  . Cocaine abuse (HCC) 07/28/2011  . Upper GI bleeding 07/27/2011  . Tachycardia 07/27/2011    Class: Acute  . Hypertension 07/27/2011    Class: Acute  . Abdominal pain 07/27/2011    Class: Acute    Patient Centered Plan: Patient is on the following Treatment Plan(s):  Depression and Substance Abuse   Referrals to Alternative Service(s): Referred to Alternative Service(s):   Place:   Date:   Time:    Referred to Alternative Service(s):   Place:   Date:   Time:    Referred to Alternative Service(s):   Place:   Date:   Time:    Referred to Alternative Service(s):   Place:   Date:    Time:     Zarra Geffert A Tyjai Matuszak, LCAS-A

## 2020-12-19 NOTE — ED Notes (Signed)
Given meal tray.

## 2020-12-19 NOTE — ED Notes (Signed)
Gave patient drink per request. Patient asked about meds and breakfast which will be here shortly.

## 2020-12-19 NOTE — ED Notes (Signed)
Message sent to [facility] RN Shanda Bumps to review chart for pt to come over

## 2020-12-19 NOTE — ED Notes (Signed)
Patient reports he was here approximately 2 months ago seeking help to stop drinking alcohol but there was no long term help available at the time so he went home and began drinking again.

## 2020-12-19 NOTE — ED Notes (Signed)
Covid Swab sent to lab

## 2020-12-19 NOTE — ED Provider Notes (Signed)
[facility] Emergency Department Provider Note  ____________________________________________   Event Date/Time   First MD Initiated Contact with Patient 12/19/20 [id number]     (approximate)  I have reviewed the triage vital signs and the nursing notes.   HISTORY  Chief Complaint Psychiatric Evaluation and Alcohol Intoxication    HPI Lee Sparks is a 50 y.o. male with history of alcohol abuse who presents to the emergency department with multiple complaints.  He reports he has had left-sided sharp chest pain over the past 4 to 5 hours.  He has had associated shortness of breath but no nausea, vomiting, dizziness.  No fevers or cough.  No history of CAD.  No history of PE, DVT, exogenous estrogen use, recent fractures, surgery, trauma, hospitalization, prolonged travel or other immobilization. No lower extremity swelling or pain. No calf tenderness.  Also complaining of feeling suicidal.  States he has a plan to cut his wrists with a knife.  No previous suicide attempt.  No HI.  Denies hallucinations.  Also requesting help with alcohol abuse.  States he drinks a 15 pack a day and has been drinking heavily for years.  States when he does not drink he will start to shake, sweat, and sometimes have hallucinations but no history of withdrawal seizure.  Denies any drug use.  States last drink was 24 hours ago.        Past Medical History:  Diagnosis Date  . Alcohol-induced pancreatitis   . Anemia   . Bronchitis     Patient Active Problem List   Diagnosis Date Noted  . Severe recurrent major depression without psychotic features (HCC) 10/31/2020  . Severe alcohol use disorder (HCC) 10/31/2020  . Pancreatitis 05/03/2017  . Acute pancreatitis 05/01/2017  . Thrombocytopenia (HCC) 07/29/2011  . Cocaine abuse (HCC) 07/28/2011  . Upper GI bleeding 07/27/2011  . Tachycardia 07/27/2011    Class: Acute  . Hypertension 07/27/2011    Class: Acute  . Abdominal  pain 07/27/2011    Class: Acute    Past Surgical History:  Procedure Laterality Date  . CYSTECTOMY    . ESOPHAGOGASTRODUODENOSCOPY  07/28/2011   Procedure: ESOPHAGOGASTRODUODENOSCOPY (EGD);  Surgeon: Freddy Jaksch, MD;  Location: [facility] ENDOSCOPY;  Service: Endoscopy;  Laterality: N/A;  . HERNIA REPAIR      Prior to Admission medications   Medication Sig Start Date End Date Taking? Authorizing Provider  acetaminophen (TYLENOL) 500 MG tablet Take 2 tablets (1,000 mg total) by mouth every 12 (twelve) hours as needed for moderate pain. 05/10/20   Iloabachie, Chioma E, NP  albuterol (PROVENTIL HFA;VENTOLIN HFA) 108 (90 Base) MCG/ACT inhaler Inhale 2 puffs into the lungs every 4 (four) hours as needed for wheezing or shortness of breath. Patient not taking: Reported on 04/23/2020 06/17/17   Kem Boroughs B, FNP  buPROPion (WELLBUTRIN XL) 150 MG 24 hr tablet Take 1 tablet (150 mg total) by mouth daily. 11/09/20   [name], MD  cetirizine (ZYRTEC) 10 MG tablet TAKE 1/2 TABLET (5 MG TOTAL) BY MOUTH EVERY DAY 05/10/20 05/10/21  Iloabachie, Chioma E, NP  cetirizine (ZYRTEC) 5 MG tablet Take 1 tablet (5 mg total) by mouth daily. 05/10/20   Iloabachie, Chioma E, NP  hydrOXYzine (ATARAX/VISTARIL) 50 MG tablet Take 1 tablet (50 mg total) by mouth every 6 (six) hours as needed for anxiety (sleep). 11/08/20   [name], MD  mirtazapine (REMERON) 45 MG tablet Take 1 tablet (45 mg total) by mouth at bedtime.  11/08/20   Jesse SansFreeman, Megan M, MD  naltrexone (DEPADE) 50 MG tablet Take 1 tablet (50 mg total) by mouth daily. 11/09/20   Jesse SansFreeman, Megan M, MD  naproxen (NAPROSYN) 500 MG tablet Take 1 tablet (500 mg total) by mouth 2 (two) times daily with a meal. 07/19/20   Joni ReiningSmith, Ronald K, PA-C  thiamine (VITAMIN B-1) 100 MG tablet Take 1 tablet (100 mg total) by mouth daily. 11/09/20   Jesse SansFreeman, Megan M, MD    Allergies Grapefruit concentrate and Grapefruit diet or [extra strength grapefruit]  Family  History  Problem Relation Age of Onset  . Hypertension Father   . Aneurysm Father   . Cancer Other   . Hypertension Mother   . Pneumonia Mother     Social History Social History   Tobacco Use  . Smoking status: Current Every Day Smoker    Packs/day: 0.50    Years: 15.00    Pack years: 7.50    Types: Cigarettes    Last attempt to quit: 06/27/2011    Years since quitting: 9.4  . Smokeless tobacco: Never Used  Substance Use Topics  . Alcohol use: Yes    Alcohol/week: 50.0 standard drinks    Types: 50 Cans of beer per week    Comment: Equivalent of two 40 oz beers daily.  . Drug use: No    Review of Systems Constitutional: No fever. Eyes: No visual changes. ENT: No sore throat. Cardiovascular: + chest pain. Respiratory: Denies shortness of breath. Gastrointestinal: No nausea, vomiting, diarrhea. Genitourinary: Negative for dysuria. Musculoskeletal: Negative for back pain. Skin: Negative for rash. Neurological: Negative for focal weakness or numbness.  ____________________________________________   PHYSICAL EXAM:  VITAL SIGNS: ED Triage Vitals  Enc Vitals Group     BP 12/19/20 0324 (!) 153/107     Pulse Rate 12/19/20 0324 (!) 105     Resp 12/19/20 0324 20     Temp 12/19/20 0324 98.4 F (36.9 C)     Temp Source 12/19/20 0324 Oral     SpO2 12/19/20 0324 98 %     Weight 12/19/20 0325 185 lb (83.9 kg)     Height 12/19/20 0325 5\' 9"  (1.753 m)     Head Circumference --      Peak Flow --      Pain Score 12/19/20 0325 0     Pain Loc --      Pain Edu? --      Excl. in GC? --    CONSTITUTIONAL: Alert and oriented and responds appropriately to questions. Well-appearing; well-nourished HEAD: Normocephalic EYES: Conjunctivae clear, pupils appear equal, EOM appear intact ENT: normal nose; moist mucous membranes NECK: Supple, normal ROM CARD: Regular and minimally tachycardic; S1 and S2 appreciated; no murmurs, no clicks, no rubs, no gallops RESP: Normal chest  excursion without splinting or tachypnea; breath sounds clear and equal bilaterally; no wheezes, no rhonchi, no rales, no hypoxia or respiratory distress, speaking full sentences ABD/GI: Normal bowel sounds; non-distended; soft, non-tender, no rebound, no guarding, no peritoneal signs, no hepatosplenomegaly BACK: The back appears normal EXT: Normal ROM in all joints; no deformity noted, no edema; no cyanosis SKIN: Normal color for age and race; warm; no rash on exposed skin NEURO: Moves all extremities equally, no asterixis, normal speech, no tremors PSYCH: The patient's mood and manner are appropriate.  Reports SI with plan.  No HI or hallucinations.  ____________________________________________   LABS (all labs ordered are listed, but only abnormal results are displayed)  Labs  Reviewed  SALICYLATE LEVEL - Abnormal; Notable for the following components:      Result Value   Salicylate Lvl <7.0 (*)    All other components within normal limits  ACETAMINOPHEN LEVEL - Abnormal; Notable for the following components:   Acetaminophen (Tylenol), Serum <10 (*)    All other components within normal limits  RESP PANEL BY RT-PCR (FLU A&B, COVID) ARPGX2  ETHANOL  CBC WITH DIFFERENTIAL/PLATELET  COMPREHENSIVE METABOLIC PANEL  URINE DRUG SCREEN, QUALITATIVE ([facility] ONLY)  TROPONIN I (HIGH SENSITIVITY)   ____________________________________________  EKG   EKG Interpretation  Date/Time:  Wednesday Dec 19 2020 03:18:49 EDT Ventricular Rate:  106 PR Interval:  120 QRS Duration: 70 QT Interval:  352 QTC Calculation: 467 R Axis:   36 Text Interpretation: Sinus tachycardia Otherwise normal ECG Confirmed by Rochele Raring [phone]) on 12/19/2020 3:24:54 AM       ____________________________________________  RADIOLOGY Normajean Baxter Vetta Couzens, personally viewed and evaluated these images (plain radiographs) as part of my medical decision making, as well as reviewing the written report by the  radiologist.  ED MD interpretation: Chest x-ray clear.  Official radiology report(s): DG Chest 2 View  Result Date: 12/19/2020 CLINICAL DATA:  PSYCHIATRIC EVALUATION ALCOHOL INTOXICATION chest pain EXAM: CHEST - 2 VIEW COMPARISON:  X-ray 04/23/2020, CT chest 10/30/2020 FINDINGS: The heart size and mediastinal contours are within normal limits. No focal consolidation. No pulmonary edema. No pleural effusion. No pneumothorax. No acute osseous abnormality. IMPRESSION: No active cardiopulmonary disease. Electronically Signed   By: Tish Frederickson M.D.   On: 12/19/2020 04:24    ____________________________________________   PROCEDURES  Procedure(s) performed (including Critical Care):  Procedures   ____________________________________________   INITIAL IMPRESSION / ASSESSMENT AND PLAN / ED COURSE  As part of my medical decision making, I reviewed the following data within the electronic MEDICAL RECORD NUMBER Nursing notes reviewed and incorporated, Labs reviewed , EKG interpreted , Old EKG reviewed, Old chart reviewed, Radiograph reviewed , A consult was requested and obtained from this/these consultant(s) Psychiatry and Notes from prior ED visits         Patient here with multiple complaints.  Has atypical chest pain.  Low suspicion for ACS, PE, dissection.  No infectious symptoms to suggest pneumonia.  Does not appear volume overloaded.  Also complaining of alcohol abuse, wanting detox/rehab and feeling like he is having withdrawals.  Will place on CIWA protocol and consult TTS.  Patient also states he is suicidal with plan.  He is here voluntarily.  Here recently for the same.  Will consult psychiatry.  Screening labs, urine pending.  ED PROGRESS  Psychiatry has seen patient and they plan to reassess in the morning for further disposition.  Patient's work-up unremarkable other than minimally elevated liver function test likely from substance abuse.  Troponin negative.  Chest x-ray  clear.  Patient resting comfortably.  I feel he is medically cleared.  I do not feel he needs serial cardiac enzymes given symptoms ongoing for over 4 hours.  I reviewed all nursing notes and pertinent previous records as available.  I have reviewed and interpreted any EKGs, lab and urine results, imaging (as available).  ____________________________________________   FINAL CLINICAL IMPRESSION(S) / ED DIAGNOSES  Final diagnoses:  ETOH abuse  Suicidal ideation  Atypical chest pain     ED Discharge Orders    None      *Please note:  SEANN GENTHER was evaluated in Emergency Department on 12/19/2020 for the symptoms described in the  history of present illness. He was evaluated in the context of the global COVID-19 pandemic, which necessitated consideration that the patient might be at risk for infection with the SARS-CoV-2 virus that causes COVID-19. Institutional protocols and algorithms that pertain to the evaluation of patients at risk for COVID-19 are in a state of rapid change based on information released by regulatory bodies including the CDC and federal and state organizations. These policies and algorithms were followed during the patient's care in the ED.  Some ED evaluations and interventions may be delayed as a result of limited staffing during and the pandemic.*   Note:  This document was prepared using Dragon voice recognition software and may include unintentional dictation errors.   Mikaela Hilgeman, Layla Maw, DO 12/19/20 [phone]

## 2020-12-19 NOTE — ED Notes (Signed)
VOL/Pending Inpt Admit  

## 2020-12-19 NOTE — ED Triage Notes (Signed)
Patient to [facility] ambulatory with EMS from home.  Per EMS patient seeking help with alcohol abuse and having suicidal thoughts.  Patient reports normally drinks a 15 pk/day, last drink Tuesday morning, reports shortly after last drink suicidal ideations started.

## 2020-12-19 NOTE — ED Notes (Signed)
Patient personal items placed in labeled belongings bag to be secured on the unit:  Black colored shores, black colored shorts, grey colored t-shirt, black colored socks, gray colored hat, black colored jacket, wallet placed in shorts pocket, cell phone, 2 prescription bottles 1 labeled bupropion 150 mg and naltrexone 50 mg.

## 2020-12-19 NOTE — ED Notes (Signed)
Pt given lunch tray and juice at this time. 

## 2020-12-19 NOTE — Consult Note (Signed)
[facility] Face-to-Face Psychiatry Consult   Reason for Consult: Consult for 50 year old man with history of depression and alcohol abuse comes back to the emergency room reporting suicidal ideation Referring Physician: Katrinka Blazing Patient Identification: Lee Sparks MRN:  [id number] Principal Diagnosis: Severe recurrent major depression without psychotic features [facility]) Diagnosis:  Principal Problem:   Severe recurrent major depression without psychotic features (HCC) Active Problems:   Cocaine abuse (HCC)   Acute pancreatitis   Severe alcohol use disorder (HCC)   Total Time spent with patient: 1 hour  Subjective:   Lee Sparks is a 50 y.o. male patient admitted with "I just have been feeling really bad with all the family problems".  HPI: Patient seen chart reviewed.  Patient says that after leaving the hospital from his previous admission he did not take his medicine and he did not follow-up with [facility].  Instead he went right back into drinking.  He is feeling depressed.  Feels like his family do not want to have anything to do with him.  Has been having suicidal thoughts with thoughts of cutting his wrist.  Has not acted on it.  Not having any hallucinations or psychosis.  No homicidal ideation.  Denies use of any other drugs.  Past Psychiatric History: Long history of alcohol abuse.  Patient has been in residential treatment services years ago and it helped him stay sober but has not been participating in treatment recently.  No histories of seizures or delirium tremens.  Risk to Self:   Risk to Others:   Prior Inpatient Therapy:   Prior Outpatient Therapy:    Past Medical History:  Past Medical History:  Diagnosis Date  . Alcohol-induced pancreatitis   . Anemia   . Bronchitis     Past Surgical History:  Procedure Laterality Date  . CYSTECTOMY    . ESOPHAGOGASTRODUODENOSCOPY  07/28/2011   Procedure: ESOPHAGOGASTRODUODENOSCOPY (EGD);  Surgeon: Freddy Jaksch, MD;  Location: [facility]  ENDOSCOPY;  Service: Endoscopy;  Laterality: N/A;  . HERNIA REPAIR     Family History:  Family History  Problem Relation Age of Onset  . Hypertension Father   . Aneurysm Father   . Cancer Other   . Hypertension Mother   . Pneumonia Mother    Family Psychiatric  History: None reported Social History:  Social History   Substance and Sexual Activity  Alcohol Use Yes  . Alcohol/week: 50.0 standard drinks  . Types: 50 Cans of beer per week   Comment: Equivalent of two 40 oz beers daily.     Social History   Substance and Sexual Activity  Drug Use No    Social History   Socioeconomic History  . Marital status: Single    Spouse name: Not on file  . Number of children: Not on file  . Years of education: Not on file  . Highest education level: Not on file  Occupational History  . Not on file  Tobacco Use  . Smoking status: Current Every Day Smoker    Packs/day: 0.50    Years: 15.00    Pack years: 7.50    Types: Cigarettes    Last attempt to quit: 06/27/2011    Years since quitting: 9.4  . Smokeless tobacco: Never Used  Substance and Sexual Activity  . Alcohol use: Yes    Alcohol/week: 50.0 standard drinks    Types: 50 Cans of beer per week    Comment: Equivalent of two 40 oz beers daily.  . Drug use: No  .  Sexual activity: Yes  Other Topics Concern  . Not on file  Social History Narrative  . Not on file   Social Determinants of Health   Financial Resource Strain: Not on file  Food Insecurity: Not on file  Transportation Needs: Not on file  Physical Activity: Not on file  Stress: Not on file  Social Connections: Not on file   Additional Social History:    Allergies:   Allergies  Allergen Reactions  . Grapefruit Concentrate Hives  . Grapefruit Diet Or [Extra Strength Grapefruit] Hives    Pt states he has a reaction to grapefruit.     Labs:  Results for orders placed or performed during the hospital encounter of 12/19/20 (from the past 48 hour(s))   Comprehensive metabolic panel     Status: Abnormal   Collection Time: 12/19/20  3:31 AM  Result Value Ref Range   Sodium 136 135 - 145 mmol/L   Potassium 3.5 3.5 - 5.1 mmol/L   Chloride 102 98 - 111 mmol/L   CO2 24 22 - 32 mmol/L   Glucose, Bld 114 (H) 70 - 99 mg/dL    Comment: Glucose reference range applies only to samples taken after fasting for at least 8 hours.   BUN 9 6 - 20 mg/dL   Creatinine, Ser 8.67 0.61 - 1.24 mg/dL   Calcium 9.1 8.9 - 61.9 mg/dL   Total Protein 7.5 6.5 - 8.1 g/dL   Albumin 4.3 3.5 - 5.0 g/dL   AST 48 (H) 15 - 41 U/L   ALT 48 (H) 0 - 44 U/L   Alkaline Phosphatase 37 (L) 38 - 126 U/L   Total Bilirubin 1.3 (H) 0.3 - 1.2 mg/dL   GFR, Estimated >50 >93 mL/min    Comment: (NOTE) Calculated using the CKD-EPI Creatinine Equation (2021)    Anion gap 10 5 - 15    Comment: Performed at [facility], [address] [place], [place] [address]  Ethanol     Status: None   Collection Time: 12/19/20  3:31 AM  Result Value Ref Range   Alcohol, Ethyl (B) <10 <10 mg/dL    Comment: (NOTE) Lowest detectable limit for serum alcohol is 10 mg/dL.  For medical purposes only. Performed at [facility], [address] [place], [place] [address]   CBC with Diff     Status: None   Collection Time: 12/19/20  3:31 AM  Result Value Ref Range   WBC 7.2 4.0 - 10.5 K/uL   RBC 4.86 4.22 - 5.81 MIL/uL   Hemoglobin 14.5 13.0 - 17.0 g/dL   HCT 98.3 38.2 - 50.5 %   MCV 88.1 80.0 - 100.0 fL   MCH 29.8 26.0 - 34.0 pg   MCHC 33.9 30.0 - 36.0 g/dL   RDW 39.7 67.3 - 41.9 %   Platelets 152 150 - 400 K/uL   nRBC 0.0 0.0 - 0.2 %   Neutrophils Relative % 66 %   Neutro Abs 4.8 1.7 - 7.7 K/uL   Lymphocytes Relative 23 %   Lymphs Abs 1.7 0.7 - 4.0 K/uL   Monocytes Relative 8 %   Monocytes Absolute 0.6 0.1 - 1.0 K/uL   Eosinophils Relative 1 %   Eosinophils Absolute 0.1 0.0 - 0.5 K/uL   Basophils Relative 1 %   Basophils Absolute 0.0 0.0 - 0.1 K/uL   Immature  Granulocytes 1 %   Abs Immature Granulocytes 0.04 0.00 - 0.07 K/uL    Comment: Performed at [facility], [address] [place], [place] [address]  Salicylate level     Status: Abnormal   Collection Time: 12/19/20  3:31 AM  Result Value Ref Range   Salicylate Lvl <7.0 (L) 7.0 - 30.0 mg/dL    Comment: Performed at [facility], [address] [place], [place] [address]  Acetaminophen level     Status: Abnormal   Collection Time: 12/19/20  3:31 AM  Result Value Ref Range   Acetaminophen (Tylenol), Serum <10 (L) 10 - 30 ug/mL    Comment: (NOTE) Therapeutic concentrations vary significantly. A range of 10-30 ug/mL  may be an effective concentration for many patients. However, some  are best treated at concentrations outside of this range. Acetaminophen concentrations >150 ug/mL at 4 hours after ingestion  and >50 ug/mL at 12 hours after ingestion are often associated with  toxic reactions.  Performed at [facility], [address] [place], [place] [address]   Troponin I (High Sensitivity)     Status: None   Collection Time: 12/19/20  3:31 AM  Result Value Ref Range   Troponin I (High Sensitivity) 6 <18 ng/L    Comment: (NOTE) Elevated high sensitivity troponin I (hsTnI) values and significant  changes across serial measurements may suggest ACS but many other  chronic and acute conditions are known to elevate hsTnI results.  Refer to the "Links" section for chest pain algorithms and additional  guidance. Performed at [facility], [address] [place], [place] [address]   Resp Panel by RT-PCR (Flu A&B, Covid) Nasopharyngeal Swab     Status: None   Collection Time: 12/19/20  4:21 AM   Specimen: Nasopharyngeal Swab; Nasopharyngeal(NP) swabs in vial transport medium  Result Value Ref Range   SARS Coronavirus 2 by RT PCR NEGATIVE NEGATIVE    Comment: (NOTE) SARS-CoV-2 target nucleic acids are NOT DETECTED.  The SARS-CoV-2 RNA is  generally detectable in upper respiratory specimens during the acute phase of infection. The lowest concentration of SARS-CoV-2 viral copies this assay can detect is 138 copies/mL. A negative result does not preclude SARS-Cov-2 infection and should not be used as the sole basis for treatment or other patient management decisions. A negative result may occur with  improper specimen collection/handling, submission of specimen other than nasopharyngeal swab, presence of viral mutation(s) within the areas targeted by this assay, and inadequate number of viral copies(<138 copies/mL). A negative result must be combined with clinical observations, patient history, and epidemiological information. The expected result is Negative.  Fact Sheet for Patients:  BloggerCourse.com  Fact Sheet for Healthcare Providers:  SeriousBroker.it  This test is no t yet approved or cleared by the [place] FDA and  has been authorized for detection and/or diagnosis of SARS-CoV-2 by FDA under an Emergency Use Authorization (EUA). This EUA will remain  in effect (meaning this test can be used) for the duration of the COVID-19 declaration under Section 564(b)(1) of the Act, 21 U.S.C.section 360bbb-3(b)(1), unless the authorization is terminated  or revoked sooner.       Influenza A by PCR NEGATIVE NEGATIVE   Influenza B by PCR NEGATIVE NEGATIVE    Comment: (NOTE) The Xpert Xpress SARS-CoV-2/FLU/RSV plus assay is intended as an aid in the diagnosis of influenza from Nasopharyngeal swab specimens and should not be used as a sole basis for treatment. Nasal washings and aspirates are unacceptable for Xpert Xpress SARS-CoV-2/FLU/RSV testing.  Fact Sheet for Patients: BloggerCourse.com  Fact Sheet for Healthcare Providers: SeriousBroker.it  This test is not yet approved or cleared by the [place]  and has been authorized for detection and/or diagnosis of SARS-CoV-2 by FDA under an Emergency Use Authorization (EUA). This EUA will remain in effect (meaning this test can be used) for the duration of the COVID-19 declaration under Section 564(b)(1) of the Act, 21 U.S.C. section 360bbb-3(b)(1), unless the authorization is terminated or revoked.  Performed at [facility], [address] [place], [place] [address]   Urine Drug Screen, Qualitative     Status: None   Collection Time: 12/19/20  5:00 AM  Result Value Ref Range   Tricyclic, Ur Screen NONE DETECTED NONE DETECTED   Amphetamines, Ur Screen NONE DETECTED NONE DETECTED   MDMA (Ecstasy)Ur Screen NONE DETECTED NONE DETECTED   Cocaine Metabolite,Ur [place] NONE DETECTED NONE DETECTED   Opiate, Ur Screen NONE DETECTED NONE DETECTED   Phencyclidine (PCP) Ur S NONE DETECTED NONE DETECTED   Cannabinoid 50 Ng, Ur [place] NONE DETECTED NONE DETECTED   Barbiturates, Ur Screen NONE DETECTED NONE DETECTED   Benzodiazepine, Ur Scrn NONE DETECTED NONE DETECTED   Methadone Scn, Ur NONE DETECTED NONE DETECTED    Comment: (NOTE) Tricyclics + metabolites, urine    Cutoff 1000 ng/mL Amphetamines + metabolites, urine  Cutoff 1000 ng/mL MDMA (Ecstasy), urine              Cutoff 500 ng/mL Cocaine Metabolite, urine          Cutoff 300 ng/mL Opiate + metabolites, urine        Cutoff 300 ng/mL Phencyclidine (PCP), urine         Cutoff 25 ng/mL Cannabinoid, urine                 Cutoff 50 ng/mL Barbiturates + metabolites, urine  Cutoff 200 ng/mL Benzodiazepine, urine              Cutoff 200 ng/mL Methadone, urine                   Cutoff 300 ng/mL  The urine drug screen provides only a preliminary, unconfirmed analytical test result and should not be used for non-medical purposes. Clinical consideration and professional judgment should be applied to any positive drug screen result due to possible interfering substances. A more specific  alternate chemical method must be used in order to obtain a confirmed analytical result. Gas chromatography / mass spectrometry (GC/MS) is the preferred confirm atory method. Performed at [facility], [address], [place], [place] [address]     Current Facility-Administered Medications  Medication Dose Route Frequency Provider Last Rate Last Admin  . albuterol (PROVENTIL) (2.5 MG/3ML) 0.083% nebulizer solution 3 mL  3 mL Inhalation Q4H PRN Ward, Kristen N, DO      . buPROPion (WELLBUTRIN XL) 24 hr tablet 150 mg  150 mg Oral Daily Ward, Kristen N, DO   150 mg at 12/19/20 0940  . loratadine (CLARITIN) tablet 10 mg  10 mg Oral Daily Ward, Kristen N, DO   10 mg at 12/19/20 0940  . LORazepam (ATIVAN) injection 0-4 mg  0-4 mg Intravenous Q6H Ward, Kristen N, DO       Or  . LORazepam (ATIVAN) tablet 0-4 mg  0-4 mg Oral Q6H Ward, Kristen N, DO   1 mg at 12/19/20 0940  . [START ON 12/21/2020] LORazepam (ATIVAN) injection 0-4 mg  0-4 mg Intravenous Q12H Ward, Layla Maw, DO       Or  . [  START ON 12/21/2020] LORazepam (ATIVAN) tablet 0-4 mg  0-4 mg Oral Q12H Ward, Kristen N, DO      . mirtazapine (REMERON) tablet 45 mg  45 mg Oral QHS Ward, Kristen N, DO      . naltrexone (DEPADE) tablet 50 mg  50 mg Oral Daily Ward, Kristen N, DO      . thiamine tablet 100 mg  100 mg Oral Daily Ward, Kristen N, DO   100 mg at 12/19/20 0940   Or  . thiamine (B-1) injection 100 mg  100 mg Intravenous Daily Ward, Kristen N, DO       Current Outpatient Medications  Medication Sig Dispense Refill  . acetaminophen (TYLENOL) 500 MG tablet Take 2 tablets (1,000 mg total) by mouth every 12 (twelve) hours as needed for moderate pain. 30 tablet 0  . albuterol (PROVENTIL HFA;VENTOLIN HFA) 108 (90 Base) MCG/ACT inhaler Inhale 2 puffs into the lungs every 4 (four) hours as needed for wheezing or shortness of breath. (Patient not taking: Reported on 04/23/2020) 1 Inhaler 1  . buPROPion (WELLBUTRIN XL) 150 MG 24 hr  tablet Take 1 tablet (150 mg total) by mouth daily. 7 tablet 0  . cetirizine (ZYRTEC) 10 MG tablet TAKE 1/2 TABLET (5 MG TOTAL) BY MOUTH EVERY DAY 15 tablet 0  . cetirizine (ZYRTEC) 5 MG tablet Take 1 tablet (5 mg total) by mouth daily. 30 tablet 0  . hydrOXYzine (ATARAX/VISTARIL) 50 MG tablet Take 1 tablet (50 mg total) by mouth every 6 (six) hours as needed for anxiety (sleep). 28 tablet 0  . mirtazapine (REMERON) 45 MG tablet Take 1 tablet (45 mg total) by mouth at bedtime. 7 tablet 0  . naltrexone (DEPADE) 50 MG tablet Take 1 tablet (50 mg total) by mouth daily. 7 tablet 0  . naproxen (NAPROSYN) 500 MG tablet Take 1 tablet (500 mg total) by mouth 2 (two) times daily with a meal. 20 tablet 00  . thiamine (VITAMIN B-1) 100 MG tablet Take 1 tablet (100 mg total) by mouth daily. 7 tablet 0    Musculoskeletal: Strength & Muscle Tone: within normal limits Gait & Station: normal Patient leans: N/A            Psychiatric Specialty Exam:  Presentation  General Appearance: Casual  Eye Contact:Fair  Speech:Clear and Coherent  Speech Volume:Normal  Handedness:Right   Mood and Affect  Mood:Depressed  Affect:Tearful   Thought Process  Thought Processes:Goal Directed  Descriptions of Associations:Intact  Orientation:Full (Time, Place and Person)  Thought Content:Logical  History of Schizophrenia/Schizoaffective disorder:No  Duration of Psychotic Symptoms:No data recorded Hallucinations:Hallucinations: None  Ideas of Reference:None  Suicidal Thoughts:Suicidal Thoughts: Yes, Passive SI Passive Intent and/or Plan: With Intent; With Plan  Homicidal Thoughts:Homicidal Thoughts: No   Sensorium  Memory:Immediate Good; Recent Good; Remote Good  Judgment:Intact  Insight:Present   Executive Functions  Concentration:Good  Attention Span:Good  Recall:Good  Fund of Knowledge:Good  Language:Good   Psychomotor Activity  Psychomotor Activity:Psychomotor  Activity: Normal   Assets  Assets:Communication Skills; Desire for Improvement; Housing; Resilience   Sleep  Sleep:Sleep: Good   Physical Exam: Physical Exam Vitals and nursing note reviewed.  Constitutional:      Appearance: Normal appearance.  HENT:     Head: Normocephalic and atraumatic.     Mouth/Throat:     Pharynx: Oropharynx is clear.  Eyes:     Pupils: Pupils are equal, round, and reactive to light.  Cardiovascular:     Rate and Rhythm: Normal rate  and regular rhythm.  Pulmonary:     Effort: Pulmonary effort is normal.     Breath sounds: Normal breath sounds.  Abdominal:     General: Abdomen is flat.     Palpations: Abdomen is soft.  Musculoskeletal:        General: Normal range of motion.  Skin:    General: Skin is warm and dry.  Neurological:     General: No focal deficit present.     Mental Status: He is alert. Mental status is at baseline.  Psychiatric:        Attention and Perception: Attention normal.        Mood and Affect: Mood is depressed. Affect is blunt.        Speech: Speech is delayed.        Behavior: Behavior is slowed.        Thought Content: Thought content includes suicidal ideation. Thought content includes suicidal plan.        Cognition and Memory: Cognition is impaired.        Judgment: Judgment is impulsive.    Review of Systems  Constitutional: Negative.   HENT: Negative.   Eyes: Negative.   Respiratory: Negative.   Cardiovascular: Negative.   Gastrointestinal: Negative.   Musculoskeletal: Negative.   Skin: Negative.   Neurological: Negative.   Psychiatric/Behavioral: Positive for depression, substance abuse and suicidal ideas. The patient is nervous/anxious and has insomnia.    Blood pressure 136/81, pulse 85, temperature 98.2 F (36.8 C), temperature source Oral, resp. rate 17, height  (1.753 m), weight 83.9 kg, SpO2 96 %. Body mass index is 27.32 kg/m.  Treatment Plan Summary: Daily contact with patient to assess  and evaluate symptoms and progress in treatment, Medication management and Plan Patient with depression and alcohol abuse.  Not currently intoxicated.  Says that his last drink was yesterday morning.  Not tremulous or delirious.  Feeling very sad continues to have suicidal ideation.  Meets criteria for inpatient hospitalization.  Patient agrees to plan.  Case reviewed with TTS and ER physician.  Orders will be completed for admission to the unit downstairs.  Disposition: Recommend psychiatric Inpatient admission when medically cleared. Supportive therapy provided about ongoing stressors.  Mordecai Rasmussen, MD 12/19/2020 11:39 AM

## 2020-12-19 NOTE — ED Notes (Signed)
Gave breakfast tray with juice. 

## 2020-12-19 NOTE — Consult Note (Signed)
[facility] Face-to-Face Psychiatry Consult   Reason for Consult: Suicidal Ideation Referring Physician:  Dr. Elesa Massed Patient Identification: Lee Sparks MRN:  [id number] Principal Diagnosis: <principal problem not specified> Diagnosis:  Active Problems:   Cocaine abuse (HCC)   Acute pancreatitis   Severe recurrent major depression without psychotic features (HCC)   Severe alcohol use disorder (HCC)   Total Time spent with patient: 20 minutes  Subjective: " My brother and my daughter are not talking to me and it is making me feel bad." Lee Sparks is a 50 y.o. male patient presented to [facility] ED via EMS from home due to him seeking assistance for his drinking. The patient was seen in the ER for a similar presentation. He was admitted to the inpatient and was supposed to follow up with outpatient services once discharged. The patient did not do so. The patient was seen face-to-face by this provider; the chart was reviewed and consulted with Dr. Elesa Massed on 12/19/2020 due to the care of the patient. It was discussed with the EDP that the patient remained under observation overnight and will be reassessed in the a.m. to determine if he meets the criteria for psychiatric inpatient admission; he could be discharged home.  Per the ED triage nurse note,Patient to [facility] ambulatory with EMS from home.  Per EMS patient seeking help with alcohol abuse and having suicidal thoughts.  Patient reports normally drinks a 15 pk/day, last drink Tuesday morning, reports shortly after last drink suicidal ideations started.  On evaluation, the patient is alert and oriented x4, calm and cooperative, and mood-congruent with affect. The patient does not appear to be responding to internal or external stimuli. Neither is the patient presenting with any delusional thinking. The patient denies auditory or visual hallucinations. The patient admits to suicidal ideations but denies homicidal or self-harm ideations. The patient is not  presenting with any psychotic or paranoid behaviors. During an encounter with the patient, they could answer questions appropriately.  HPI:    Past Psychiatric History:   Risk to Self:   Risk to Others:   Prior Inpatient Therapy:   Prior Outpatient Therapy:    Past Medical History:  Past Medical History:  Diagnosis Date  . Alcohol-induced pancreatitis   . Anemia   . Bronchitis     Past Surgical History:  Procedure Laterality Date  . CYSTECTOMY    . ESOPHAGOGASTRODUODENOSCOPY  07/28/2011   Procedure: ESOPHAGOGASTRODUODENOSCOPY (EGD);  Surgeon: Freddy Jaksch, MD;  Location: [facility] ENDOSCOPY;  Service: Endoscopy;  Laterality: N/A;  . HERNIA REPAIR     Family History:  Family History  Problem Relation Age of Onset  . Hypertension Father   . Aneurysm Father   . Cancer Other   . Hypertension Mother   . Pneumonia Mother    Family Psychiatric  History:  Social History:  Social History   Substance and Sexual Activity  Alcohol Use Yes  . Alcohol/week: 50.0 standard drinks  . Types: 50 Cans of beer per week   Comment: Equivalent of two 40 oz beers daily.     Social History   Substance and Sexual Activity  Drug Use No    Social History   Socioeconomic History  . Marital status: Single    Spouse name: Not on file  . Number of children: Not on file  . Years of education: Not on file  . Highest education level: Not on file  Occupational History  . Not on file  Tobacco Use  . Smoking  status: Current Every Day Smoker    Packs/day: 0.50    Years: 15.00    Pack years: 7.50    Types: Cigarettes    Last attempt to quit: 06/27/2011    Years since quitting: 9.4  . Smokeless tobacco: Never Used  Substance and Sexual Activity  . Alcohol use: Yes    Alcohol/week: 50.0 standard drinks    Types: 50 Cans of beer per week    Comment: Equivalent of two 40 oz beers daily.  . Drug use: No  . Sexual activity: Yes  Other Topics Concern  . Not on file  Social History Narrative   . Not on file   Social Determinants of Health   Financial Resource Strain: Not on file  Food Insecurity: Not on file  Transportation Needs: Not on file  Physical Activity: Not on file  Stress: Not on file  Social Connections: Not on file   Additional Social History:    Allergies:   Allergies  Allergen Reactions  . Grapefruit Concentrate Hives  . Grapefruit Diet Or [Extra Strength Grapefruit] Hives    Pt states he has a reaction to grapefruit.     Labs:  Results for orders placed or performed during the hospital encounter of 12/19/20 (from the past 48 hour(s))  Ethanol     Status: None   Collection Time: 12/19/20  3:31 AM  Result Value Ref Range   Alcohol, Ethyl (B) <10 <10 mg/dL    Comment: (NOTE) Lowest detectable limit for serum alcohol is 10 mg/dL.  For medical purposes only. Performed at [facility], [address] [place], [place] [address]   CBC with Diff     Status: None   Collection Time: 12/19/20  3:31 AM  Result Value Ref Range   WBC 7.2 4.0 - 10.5 K/uL   RBC 4.86 4.22 - 5.81 MIL/uL   Hemoglobin 14.5 13.0 - 17.0 g/dL   HCT 09.4 07.6 - 80.8 %   MCV 88.1 80.0 - 100.0 fL   MCH 29.8 26.0 - 34.0 pg   MCHC 33.9 30.0 - 36.0 g/dL   RDW 81.1 03.1 - 59.4 %   Platelets 152 150 - 400 K/uL   nRBC 0.0 0.0 - 0.2 %   Neutrophils Relative % 66 %   Neutro Abs 4.8 1.7 - 7.7 K/uL   Lymphocytes Relative 23 %   Lymphs Abs 1.7 0.7 - 4.0 K/uL   Monocytes Relative 8 %   Monocytes Absolute 0.6 0.1 - 1.0 K/uL   Eosinophils Relative 1 %   Eosinophils Absolute 0.1 0.0 - 0.5 K/uL   Basophils Relative 1 %   Basophils Absolute 0.0 0.0 - 0.1 K/uL   Immature Granulocytes 1 %   Abs Immature Granulocytes 0.04 0.00 - 0.07 K/uL    Comment: Performed at [facility], [address] [place], [place] [address]  Salicylate level     Status: Abnormal   Collection Time: 12/19/20  3:31 AM  Result Value Ref Range   Salicylate Lvl <7.0 (L) 7.0 - 30.0 mg/dL     Comment: Performed at [facility], [address] [place], [place] [address]  Acetaminophen level     Status: Abnormal   Collection Time: 12/19/20  3:31 AM  Result Value Ref Range   Acetaminophen (Tylenol), Serum <10 (L) 10 - 30 ug/mL    Comment: (NOTE) Therapeutic concentrations vary significantly. A range of 10-30 ug/mL  may be an effective concentration for many patients. However, some  are best treated at concentrations outside of this range. Acetaminophen concentrations >150 ug/mL at 4 hours after ingestion  and >50 ug/mL at 12 hours after ingestion are often associated with  toxic reactions.  Performed at [facility], [address], [place], [place] [address]     Current Facility-Administered Medications  Medication Dose Route Frequency Provider Last Rate Last Admin  . albuterol (VENTOLIN HFA) 108 (90 Base) MCG/ACT inhaler 2 puff  2 puff Inhalation Q4H PRN Ward, Kristen N, DO      . buPROPion (WELLBUTRIN XL) 24 hr tablet 150 mg  150 mg Oral Daily Ward, Kristen N, DO      . loratadine (CLARITIN) tablet 10 mg  10 mg Oral Daily Ward, Kristen N, DO      . LORazepam (ATIVAN) injection 0-4 mg  0-4 mg Intravenous Q6H Ward, Kristen N, DO       Or  . LORazepam (ATIVAN) tablet 0-4 mg  0-4 mg Oral Q6H Ward, Kristen N, DO      . [START ON 12/21/2020] LORazepam (ATIVAN) injection 0-4 mg  0-4 mg Intravenous Q12H Ward, Kristen N, DO       Or  . Melene Muller ON 12/21/2020] LORazepam (ATIVAN) tablet 0-4 mg  0-4 mg Oral Q12H Ward, Kristen N, DO      . mirtazapine (REMERON) tablet 45 mg  45 mg Oral QHS Ward, Kristen N, DO      . naltrexone (DEPADE) tablet 50 mg  50 mg Oral Daily Ward, Kristen N, DO      . thiamine tablet 100 mg  100 mg Oral Daily Ward, Kristen N, DO       Or  . thiamine (B-1) injection 100 mg  100 mg Intravenous Daily Ward, Kristen N, DO       Current Outpatient Medications  Medication Sig Dispense Refill  . acetaminophen (TYLENOL) 500 MG tablet Take 2 tablets  (1,000 mg total) by mouth every 12 (twelve) hours as needed for moderate pain. 30 tablet 0  . albuterol (PROVENTIL HFA;VENTOLIN HFA) 108 (90 Base) MCG/ACT inhaler Inhale 2 puffs into the lungs every 4 (four) hours as needed for wheezing or shortness of breath. (Patient not taking: Reported on 04/23/2020) 1 Inhaler 1  . buPROPion (WELLBUTRIN XL) 150 MG 24 hr tablet Take 1 tablet (150 mg total) by mouth daily. 7 tablet 0  . cetirizine (ZYRTEC) 10 MG tablet TAKE 1/2 TABLET (5 MG TOTAL) BY MOUTH EVERY DAY 15 tablet 0  . cetirizine (ZYRTEC) 5 MG tablet Take 1 tablet (5 mg total) by mouth daily. 30 tablet 0  . hydrOXYzine (ATARAX/VISTARIL) 50 MG tablet Take 1 tablet (50 mg total) by mouth every 6 (six) hours as needed for anxiety (sleep). 28 tablet 0  . mirtazapine (REMERON) 45 MG tablet Take 1 tablet (45 mg total) by mouth at bedtime. 7 tablet 0  . naltrexone (DEPADE) 50 MG tablet Take 1 tablet (50 mg total) by mouth daily. 7 tablet 0  . naproxen (NAPROSYN) 500 MG tablet Take 1 tablet (500 mg total) by mouth 2 (two) times daily with a meal. 20 tablet 00  . thiamine (VITAMIN B-1) 100 MG tablet Take 1 tablet (100 mg total) by mouth daily. 7 tablet 0    Musculoskeletal: Strength & Muscle Tone: within normal limits Gait & Station: normal Patient leans: N/A            Psychiatric Specialty Exam:  Presentation  General Appearance: Casual  Eye Contact:Fair  Speech:Clear and Coherent  Speech Volume:Normal  Handedness:Right   Mood and Affect  Mood:Depressed  Affect:Tearful   Thought Process  Thought Processes:Goal Directed  Descriptions of Associations:Intact  Orientation:Full (Time, Place and Person)  Thought Content:Logical  History of Schizophrenia/Schizoaffective disorder:No  Duration of Psychotic Symptoms:No data recorded Hallucinations:Hallucinations: None  Ideas of Reference:None  Suicidal Thoughts:Suicidal Thoughts: Yes, Passive SI Passive Intent and/or Plan:  With Intent; With Plan  Homicidal Thoughts:Homicidal Thoughts: No   Sensorium  Memory:Immediate Good; Recent Good; Remote Good  Judgment:Intact  Insight:Present   Executive Functions  Concentration:Good  Attention Span:Good  Recall:Good  Fund of Knowledge:Good  Language:Good   Psychomotor Activity  Psychomotor Activity:Psychomotor Activity: Normal   Assets  Assets:Communication Skills; Desire for Improvement; Housing; Resilience   Sleep  Sleep:Sleep: Good   Physical Exam: Physical Exam Vitals and nursing note reviewed.  Constitutional:      Appearance: Normal appearance. He is obese.  HENT:     Nose: Nose normal.     Mouth/Throat:     Mouth: Mucous membranes are moist.  Cardiovascular:     Rate and Rhythm: Tachycardia present.  Pulmonary:     Effort: Pulmonary effort is normal.  Musculoskeletal:        General: Normal range of motion.     Cervical back: Normal range of motion and neck supple.  Neurological:     General: No focal deficit present.     Mental Status: He is alert and oriented to person, place, and time.  Psychiatric:        Attention and Perception: Attention and perception normal.        Mood and Affect: Mood is anxious and depressed. Affect is blunt.        Speech: Speech normal.        Behavior: Behavior normal. Behavior is cooperative.        Thought Content: Thought content includes suicidal ideation.        Cognition and Memory: Cognition and memory normal.        Judgment: Judgment is impulsive and inappropriate.    Review of Systems  Psychiatric/Behavioral: Positive for depression and suicidal ideas.  All other systems reviewed and are negative.  Blood pressure (!) 153/107, pulse (!) 105, temperature 98.4 F (36.9 C), temperature source Oral, resp. rate 20, height 5\' 9"  (1.753 m), weight 83.9 kg, SpO2 98 %. Body mass index is 27.32 kg/m.  Treatment Plan Summary: Daily contact with patient to assess and evaluate symptoms  and progress in treatment and Plan The patient remained under observation overnight and will be reassessed in the a.m. to determine if he meets the criteria for psychiatric inpatient admission; he could be discharged home.  Disposition: Supportive therapy provided about ongoing stressors. The patient remained under observation overnight and will be reassessed in the a.m. to determine if he meets the criteria for psychiatric inpatient admission; he could be discharged home.  Gillermo MurdochJacqueline Braxdon Gappa, NP 12/19/2020 4:58 AM

## 2020-12-20 ENCOUNTER — Encounter: Payer: Self-pay | Admitting: Psychiatry

## 2020-12-20 ENCOUNTER — Inpatient Hospital Stay
Admission: RE | Admit: 2020-12-20 | Discharge: 2020-12-27 | DRG: 885 | Disposition: A | Discharge status: Home / Self Care | Payer: 59 | Source: Intra-hospital | Attending: Behavioral Health | Admitting: Behavioral Health

## 2020-12-20 DIAGNOSIS — F419 Anxiety disorder, unspecified: Secondary | ICD-10-CM | POA: Diagnosis present

## 2020-12-20 DIAGNOSIS — F332 Major depressive disorder, recurrent severe without psychotic features: Principal | ICD-10-CM | POA: Diagnosis present

## 2020-12-20 DIAGNOSIS — Z79899 Other long term (current) drug therapy: Secondary | ICD-10-CM | POA: Diagnosis not present

## 2020-12-20 DIAGNOSIS — F1410 Cocaine abuse, uncomplicated: Secondary | ICD-10-CM | POA: Diagnosis present

## 2020-12-20 DIAGNOSIS — Z5900 Homelessness unspecified: Secondary | ICD-10-CM | POA: Diagnosis not present

## 2020-12-20 DIAGNOSIS — E569 Vitamin deficiency, unspecified: Secondary | ICD-10-CM | POA: Diagnosis present

## 2020-12-20 DIAGNOSIS — Z91018 Allergy to other foods: Secondary | ICD-10-CM | POA: Diagnosis not present

## 2020-12-20 DIAGNOSIS — R251 Tremor, unspecified: Secondary | ICD-10-CM | POA: Diagnosis present

## 2020-12-20 DIAGNOSIS — Z20822 Contact with and (suspected) exposure to covid-19: Secondary | ICD-10-CM | POA: Diagnosis present

## 2020-12-20 DIAGNOSIS — F17210 Nicotine dependence, cigarettes, uncomplicated: Secondary | ICD-10-CM | POA: Diagnosis present

## 2020-12-20 DIAGNOSIS — F1020 Alcohol dependence, uncomplicated: Secondary | ICD-10-CM | POA: Diagnosis present

## 2020-12-20 DIAGNOSIS — R45851 Suicidal ideations: Secondary | ICD-10-CM | POA: Diagnosis present

## 2020-12-20 DIAGNOSIS — Z23 Encounter for immunization: Secondary | ICD-10-CM

## 2020-12-20 DIAGNOSIS — I10 Essential (primary) hypertension: Secondary | ICD-10-CM | POA: Diagnosis present

## 2020-12-20 DIAGNOSIS — Z8249 Family history of ischemic heart disease and other diseases of the circulatory system: Secondary | ICD-10-CM

## 2020-12-20 DIAGNOSIS — F10239 Alcohol dependence with withdrawal, unspecified: Secondary | ICD-10-CM | POA: Diagnosis present

## 2020-12-20 MED ORDER — LORAZEPAM 1 MG PO TABS
1.0000 mg | ORAL_TABLET | Freq: Two times a day (BID) | ORAL | Status: AC
  Administered 2020-12-22 – 2020-12-23 (×2): 1 mg via ORAL
  Filled 2020-12-20 (×2): qty 1

## 2020-12-20 MED ORDER — LORAZEPAM 1 MG PO TABS
1.0000 mg | ORAL_TABLET | Freq: Three times a day (TID) | ORAL | Status: AC
  Administered 2020-12-21 – 2020-12-22 (×3): 1 mg via ORAL
  Filled 2020-12-20 (×3): qty 1

## 2020-12-20 MED ORDER — LORAZEPAM 1 MG PO TABS
1.0000 mg | ORAL_TABLET | Freq: Every day | ORAL | Status: AC
  Administered 2020-12-24: 1 mg via ORAL
  Filled 2020-12-20: qty 1

## 2020-12-20 MED ORDER — LORATADINE 10 MG PO TABS
10.0000 mg | ORAL_TABLET | Freq: Every day | ORAL | Status: DC
  Administered 2020-12-20 – 2020-12-27 (×8): 10 mg via ORAL
  Filled 2020-12-20 (×9): qty 1

## 2020-12-20 MED ORDER — MIRTAZAPINE 15 MG PO TABS
45.0000 mg | ORAL_TABLET | Freq: Every day | ORAL | Status: DC
  Administered 2020-12-20 – 2020-12-25 (×6): 45 mg via ORAL
  Filled 2020-12-20 (×7): qty 3

## 2020-12-20 MED ORDER — LOPERAMIDE HCL 2 MG PO CAPS: 2.0000 mg | ORAL_CAPSULE | ORAL | Status: AC | PRN

## 2020-12-20 MED ORDER — NALTREXONE HCL 50 MG PO TABS
50.0000 mg | ORAL_TABLET | Freq: Every day | ORAL | Status: DC
  Administered 2020-12-21 – 2020-12-27 (×7): 50 mg via ORAL
  Filled 2020-12-20 (×8): qty 1

## 2020-12-20 MED ORDER — THIAMINE HCL 100 MG PO TABS
100.0000 mg | ORAL_TABLET | Freq: Every day | ORAL | Status: DC
  Administered 2020-12-21 – 2020-12-27 (×7): 100 mg via ORAL
  Filled 2020-12-20 (×7): qty 1

## 2020-12-20 MED ORDER — HYDROXYZINE HCL 25 MG PO TABS
25.0000 mg | ORAL_TABLET | Freq: Four times a day (QID) | ORAL | Status: DC | PRN
  Administered 2020-12-20: 25 mg via ORAL
  Filled 2020-12-20: qty 1

## 2020-12-20 MED ORDER — FOLIC ACID 1 MG PO TABS
1.0000 mg | ORAL_TABLET | Freq: Every day | ORAL | Status: DC
  Administered 2020-12-21 – 2020-12-27 (×7): 1 mg via ORAL
  Filled 2020-12-20 (×7): qty 1

## 2020-12-20 MED ORDER — ONDANSETRON 4 MG PO TBDP: 4.0000 mg | ORAL_TABLET | Freq: Four times a day (QID) | ORAL | Status: AC | PRN

## 2020-12-20 MED ORDER — THIAMINE HCL 100 MG/ML IJ SOLN: 100.0000 mg | Freq: Once | INTRAMUSCULAR | Status: DC

## 2020-12-20 MED ORDER — ADULT MULTIVITAMIN W/MINERALS CH
1.0000 | ORAL_TABLET | Freq: Every day | ORAL | Status: DC
  Administered 2020-12-21 – 2020-12-27 (×7): 1 via ORAL
  Filled 2020-12-20 (×7): qty 1

## 2020-12-20 MED ORDER — MAGNESIUM HYDROXIDE 400 MG/5ML PO SUSP: 30.0000 mL | Freq: Every day | ORAL | Status: DC | PRN

## 2020-12-20 MED ORDER — ALBUTEROL SULFATE (2.5 MG/3ML) 0.083% IN NEBU
3.0000 mL | INHALATION_SOLUTION | RESPIRATORY_TRACT | Status: DC | PRN
Start: 2020-12-20 — End: 2020-12-27
  Filled 2020-12-20: qty 3

## 2020-12-20 MED ORDER — LORAZEPAM 1 MG PO TABS
1.0000 mg | ORAL_TABLET | Freq: Four times a day (QID) | ORAL | Status: AC
  Administered 2020-12-20 – 2020-12-21 (×5): 1 mg via ORAL
  Filled 2020-12-20 (×6): qty 1

## 2020-12-20 MED ORDER — ALUM & MAG HYDROXIDE-SIMETH 200-200-20 MG/5ML PO SUSP: 30.0000 mL | ORAL | Status: DC | PRN

## 2020-12-20 MED ORDER — BUPROPION HCL ER (XL) 150 MG PO TB24
150.0000 mg | ORAL_TABLET | Freq: Every day | ORAL | Status: DC
  Administered 2020-12-21 – 2020-12-27 (×7): 150 mg via ORAL
  Filled 2020-12-20 (×7): qty 1

## 2020-12-20 MED ORDER — NICOTINE POLACRILEX 2 MG MT GUM
2.0000 mg | CHEWING_GUM | OROMUCOSAL | Status: DC | PRN
  Administered 2020-12-20 – 2020-12-27 (×10): 2 mg via ORAL
  Filled 2020-12-20 (×10): qty 1

## 2020-12-20 MED ORDER — LORAZEPAM 1 MG PO TABS
1.0000 mg | ORAL_TABLET | Freq: Four times a day (QID) | ORAL | Status: DC | PRN
  Administered 2020-12-20 (×2): 1 mg via ORAL
  Filled 2020-12-20: qty 1

## 2020-12-20 MED ORDER — ACETAMINOPHEN 325 MG PO TABS
650.0000 mg | ORAL_TABLET | Freq: Four times a day (QID) | ORAL | Status: DC | PRN
  Administered 2020-12-26: 650 mg via ORAL
  Filled 2020-12-20: qty 2

## 2020-12-20 MED ORDER — AMLODIPINE BESYLATE 5 MG PO TABS
5.0000 mg | ORAL_TABLET | Freq: Every day | ORAL | Status: DC
  Administered 2020-12-21 – 2020-12-27 (×7): 5 mg via ORAL
  Filled 2020-12-20 (×7): qty 1

## 2020-12-20 NOTE — Progress Notes (Signed)
CIWA conducted with evening med pass. Patient scored 3, stating higher than normal anxiety and agitation.  Patient received Vistaril an hour ago, but says it has not helped any. Received Ativan and other QHS medication and tolerated without incident. Encouraged patient to come to staff with any concerns and will continue to monitor with Q 15 minute safety checks.    Lee Butler-Nicholson, LPN

## 2020-12-20 NOTE — Progress Notes (Signed)
Patient came to nurses station wanting something to help with anxiety. PRN Vistaril was provided and tolerated without incident.  Presents flat and depressed at this encounter.  Tried to get Ativan, but was understanding of the fact that the medication is given every 6hrs and would not be available for another hour. He denies SI/HI/AVH and pain.  He continues to endorse depression and anxiety. He continues to report withdrawal symptoms, but stated that he is starting to feel better. Will continue to monitor with 15 minute safety checks and encourage to come to staff with any concerns.     Cleo Butler-Nicholson, LPN
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
## 2020-12-20 NOTE — BHH Group Notes (Signed)
LCSW Group Therapy Note  12/20/2020 2:13 PM  Type of Therapy/Topic:  Group Therapy:  Balance in Life  Participation Level:  Did Not Attend  Description of Group:    This group will address the concept of balance and how it feels and looks when one is unbalanced. Patients will be encouraged to process areas in their lives that are out of balance and identify reasons for remaining unbalanced. Facilitators will guide patients in utilizing problem-solving interventions to address and correct the stressor making their life unbalanced. Understanding and applying boundaries will be explored and addressed for obtaining and maintaining a balanced life. Patients will be encouraged to explore ways to assertively make their unbalanced needs known to significant others in their lives, using other group members and facilitator for support and feedback.  Therapeutic Goals: 1. Patient will identify two or more emotions or situations they have that consume much of in their lives. 2. Patient will identify signs/triggers that life has become out of balance:  3. Patient will identify two ways to set boundaries in order to achieve balance in their lives:  4. Patient will demonstrate ability to communicate their needs through discussion and/or role plays  Summary of Patient Progress: X  Therapeutic Modalities:   Cognitive Behavioral Therapy Solution-Focused Therapy Assertiveness Training  Guilherme Schwenke R. Brea Coleson, MSW, LCSW, LCAS 12/20/2020 2:13 PM   

## 2020-12-20 NOTE — [facility] Suicide Risk Assessment (Signed)
[facility] Admission Suicide Risk Assessment   Nursing information obtained from:  Patient Demographic factors:  Male,Caucasian,Low socioeconomic status Current Mental Status:  Suicidal ideation indicated by patient Loss Factors:  Loss of significant relationship Historical Factors:  Impulsivity Risk Reduction Factors:  Positive social support  Total Time spent with patient: 1 hour Principal Problem: Severe recurrent major depression without psychotic features (HCC) Diagnosis:  Principal Problem:   Severe recurrent major depression without psychotic features (HCC) Active Problems:   Hypertension   Cocaine abuse (HCC)   Severe alcohol use disorder (HCC)  Subjective Data: 50 year old male presenting to the hospital due to worsening depression, relapse on alcohol, and suicidal ideations.  No acute events overnight, medication compliant, attending to ADLs.  Patient seen one-on-one this morning and he notes that he has becoming increasingly depressed.  After he left he did not follow-up with [facility] or take his medications as prescribed.  He began drinking more heavily, and became increasingly depressed.  He notes he is having an increasingly strained relationship with his daughter, and is no longer living with his brother.  He was briefly living with a lady for approximately 2 weeks, but notes he was getting into more trouble with alcohol.  On exam he notes he is still having tremor, sweats, diarrhea, nausea.  He feels that he would benefit from residential treatment for substance abuse, and restarting his medications.  He also feels like his blood pressure may be elevated.  Continued Clinical Symptoms:  Alcohol Use Disorder Identification Test Final Score (AUDIT): 8 The "Alcohol Use Disorders Identification Test", Guidelines for Use in Primary Care, Second Edition.  World Science writer [facility]). Score between 0-7:  no or low risk or alcohol related problems. Score between 8-15:  moderate risk of  alcohol related problems. Score between 16-19:  high risk of alcohol related problems. Score 20 or above:  warrants further diagnostic evaluation for alcohol dependence and treatment.   CLINICAL FACTORS:   Depression:   Anhedonia Comorbid alcohol abuse/dependence Hopelessness Impulsivity Insomnia Severe Alcohol/Substance Abuse/Dependencies More than one psychiatric diagnosis Unstable or Poor Therapeutic Relationship   Musculoskeletal: Strength & Muscle Tone: within normal limits Gait & Station: normal Patient leans: N/A  Psychiatric Specialty Exam:  Presentation  General Appearance: Casual  Eye Contact:Fair  Speech:Clear and Coherent  Speech Volume:Normal  Handedness:Right   Mood and Affect  Mood:Depressed  Affect:Congruent   Thought Process  Thought Processes:Goal Directed  Descriptions of Associations:Intact  Orientation:Full (Time, Place and Person)  Thought Content:Logical  History of Schizophrenia/Schizoaffective disorder:No  Duration of Psychotic Symptoms:No data recorded Hallucinations:Hallucinations: None  Ideas of Reference:None  Suicidal Thoughts:Suicidal Thoughts: Yes, Passive SI Passive Intent and/or Plan: With Intent; With Plan  Homicidal Thoughts:Homicidal Thoughts: No   Sensorium  Memory:Immediate Good; Recent Good; Remote Good  Judgment:Impaired  Insight:Present   Executive Functions  Concentration:Fair  Attention Span:Fair  Recall:Fair  Fund of Knowledge:Fair  Language:Fair   Psychomotor Activity  Psychomotor Activity:Psychomotor Activity: Normal   Assets  Assets:Communication Skills; Desire for Improvement; Resilience   Sleep  Sleep:Sleep: Poor Number of Hours of Sleep: 4.75    Physical Exam: Physical Exam ROS Blood pressure (!) 137/91, pulse 98, temperature 98.7 F (37.1 C), temperature source Oral, resp. rate 18, height 5\' 9"  (1.753 m), weight 83.9 kg, SpO2 100 %. Body mass index is 27.31  kg/m.   COGNITIVE FEATURES THAT CONTRIBUTE TO RISK:  Loss of executive function    SUICIDE RISK:   Mild:  Suicidal ideation of limited frequency, intensity, duration, and  specificity.  There are no identifiable plans, no associated intent, mild dysphoria and related symptoms, good self-control (both objective and subjective assessment), few other risk factors, and identifiable protective factors, including available and accessible social support.  PLAN OF CARE: Continue inpatient admission, see H&P for details  I certify that inpatient services furnished can reasonably be expected to improve the patient's condition.   [name], MD 12/20/2020, 2:25 PM

## 2020-12-20 NOTE — Tx Team (Signed)
Initial Treatment Plan 12/20/2020 1:23 AM SULLIVAN JACUINDE FIE:[id number]    PATIENT STRESSORS: Medication change or noncompliance Substance abuse   PATIENT STRENGTHS: Ability for insight Motivation for treatment/growth   PATIENT IDENTIFIED PROBLEMS: Suicidal Ideation  Depression                   DISCHARGE CRITERIA:  Improved stabilization in mood, thinking, and/or behavior Verbal commitment to aftercare and medication compliance  PRELIMINARY DISCHARGE PLAN: Outpatient therapy Placement in alternative living arrangements  PATIENT/FAMILY INVOLVEMENT: This treatment plan has been presented to and reviewed with the patient, Lee Sparks. The patient has been given the opportunity to ask questions and make suggestions.  Elmyra Ricks, RN 12/20/2020, 1:23 AM

## 2020-12-20 NOTE — Plan of Care (Signed)
Patient new to the unit tonight, hasn't had time to progress  Problem: Education: Goal: Knowledge of [facility] Education information/materials will improve Outcome: Not Progressing Goal: Emotional status will improve Outcome: Not Progressing Goal: Mental status will improve Outcome: Not Progressing Goal: Verbalization of understanding the information provided will improve Outcome: Not Progressing   Problem: Safety: Goal: Periods of time without injury will increase Outcome: Not Progressing   Problem: Education: Goal: Ability to make informed decisions regarding treatment will improve Outcome: Not Progressing   Problem: Medication: Goal: Compliance with prescribed medication regimen will improve Outcome: Not Progressing   

## 2020-12-20 NOTE — [facility] Suicide Risk Assessment (Addendum)
[facility] INPATIENT:  Family/Significant Other Suicide Prevention Education  Suicide Prevention Education:  Contact Attempts: Vanessa Alesi [phone]), has been identified by the patient as the family member/significant other with whom the patient will be residing, and identified as the person(s) who will aid the patient in the event of a mental health crisis.  With written consent from the patient, two attempts were made to provide suicide prevention education, prior to and/or following the patient's discharge.  We were unsuccessful in providing suicide prevention education.  A suicide education pamphlet was given to the patient to share with family/significant other.  Date and time of first attempt: 12/20/20 at 3:51pm Date and time of second attempt: second attempt needed  HIPPA compliant voicemail left with contact information for follow through.  Glenis Smoker 12/20/2020, 3:52 PM

## 2020-12-20 NOTE — Progress Notes (Signed)
Recreation Therapy Notes  Date: 12/20/2020  Time: 9:30 am   Location: Craft room     Behavioral response: N/A   Intervention Topic: Animal Assisted therapy    Discussion/Intervention: Patient did not attend group.   Clinical Observations/Feedback:  Patient did not attend group.   Thaila Bottoms LRT/CTRS        Osborne Serio 12/20/2020 12:39 PM

## 2020-12-20 NOTE — Progress Notes (Addendum)
Pt has been alert and oriented to person, place, time and situation. Pt is calm, cooperative, affect is flat. Pt denies suicidal and homicidal ideation, denies hallucinations at this time, reports feelings of anxiety depression, rating them both a 9/10 on a 0-10 scale, 10 being worst. Pt reports he has some withdrawal symptoms from alcohol and reports the scheduled Ativan medication has helped reduce the severity of those symptoms. Pt CIWA has been monitored. No acute distress noted. Pt spends time in his room often isolating and resting in bed. Pt reports he hopes to go to long term substance abuse treatment. Will continue to monitor pt per Q15 minute face checks and monitor for safety and progress. Earlier in the day pt reported passive suicidal ideation without plan or intent, reports that its fleeing, and that it comes and goes throughout the day. Pt verbally contracts for safety.

## 2020-12-20 NOTE — Plan of Care (Signed)
  Problem: Education: Goal: Knowledge of [facility] Education information/materials will improve Outcome: Progressing Goal: Emotional status will improve Outcome: Progressing Goal: Mental status will improve Outcome: Progressing Goal: Verbalization of understanding the information provided will improve Outcome: Progressing   Problem: Safety: Goal: Periods of time without injury will increase Outcome: Progressing   Problem: Education: Goal: Ability to make informed decisions regarding treatment will improve Outcome: Progressing   Problem: Medication: Goal: Compliance with prescribed medication regimen will improve Outcome: Progressing   

## 2020-12-20 NOTE — H&P (Signed)
Psychiatric Admission Assessment Adult  Patient Identification: Lee Sparks MRN:  [id number] Date of Evaluation:  12/20/2020 Chief Complaint:  Severe recurrent major depression without psychotic features (HCC) [F33.2] Principal Diagnosis: Severe recurrent major depression without psychotic features (HCC) Diagnosis:  Principal Problem:   Severe recurrent major depression without psychotic features (HCC) Active Problems:   Hypertension   Cocaine abuse (HCC)   Severe alcohol use disorder (HCC)  CC "I'm back."  History of Present Illness: 50 year old male presenting to the hospital due to worsening depression, relapse on alcohol, and suicidal ideations.  No acute events overnight, medication compliant, attending to ADLs.  Patient seen one-on-one this morning and he notes that he has becoming increasingly depressed.  After he left he did not follow-up with [facility] or take his medications as prescribed.  He began drinking more heavily, and became increasingly depressed.  He notes he is having an increasingly strained relationship with his daughter, and is no longer living with his brother.  He was briefly living with a lady for approximately 2 weeks, but notes he was getting into more trouble with alcohol.  On exam he notes he is still having tremor, sweats, diarrhea, nausea.  He feels that he would benefit from residential treatment for substance abuse, and restarting his medications.  He also feels like his blood pressure may be elevated. Associated Signs/Symptoms: Depression Symptoms:  depressed mood, anhedonia, hopelessness, recurrent thoughts of death, suicidal thoughts without plan, anxiety, loss of energy/fatigue, Duration of Depression Symptoms: Greater than two weeks  (Hypo) Manic Symptoms:  Impulsivity, Anxiety Symptoms:  Excessive Worry, Psychotic Symptoms:  Denies PTSD Symptoms: Negative Total Time spent with patient: 1 hour  Past Psychiatric History:Has been to [facility] in the  past for detox. No history of DTs or seizures from alcohol withdrawals, but he does experience tremors. Previously here in April for similar circumstances. Improved with Remeron and Wellbutrin. Did not follow-up after discharge.   Is the patient at risk to self? Yes.    Has the patient been a risk to self in the past 6 months? Yes.    Has the patient been a risk to self within the distant past? No.  Is the patient a risk to others? No.  Has the patient been a risk to others in the past 6 months? No.  Has the patient been a risk to others within the distant past? No.   Prior Inpatient Therapy:   Prior Outpatient Therapy:    Alcohol Screening: 1. How often do you have a drink containing alcohol?: 2 to 3 times a week 2. How many drinks containing alcohol do you have on a typical day when you are drinking?: 5 or 6 3. How often do you have six or more drinks on one occasion?: Monthly AUDIT-C Score: 7 4. How often during the last year have you found that you were not able to stop drinking once you had started?: Less than monthly 5. How often during the last year have you failed to do what was normally expected from you because of drinking?: Never 6. How often during the last year have you needed a first drink in the morning to get yourself going after a heavy drinking session?: Never 7. How often during the last year have you had a feeling of guilt of remorse after drinking?: Never 8. How often during the last year have you been unable to remember what happened the night before because you had been drinking?: Never 9. Have you or someone else  been injured as a result of your drinking?: No 10. Has a relative or friend or a doctor or another health worker been concerned about your drinking or suggested you cut down?: No Alcohol Use Disorder Identification Test Final Score (AUDIT): 8 Alcohol Brief Interventions/Follow-up: Alcohol education/Brief advice Substance Abuse History in the last 12 months:   Yes.   Consequences of Substance Abuse: Family Consequences:  estrangement from daughter and brother Withdrawal Symptoms:   Diarrhea Headaches Nausea Tremors Previous Psychotropic Medications: Yes  Psychological Evaluations: Yes  Past Medical History:  Past Medical History:  Diagnosis Date  . Alcohol-induced pancreatitis   . Anemia   . Bronchitis     Past Surgical History:  Procedure Laterality Date  . CYSTECTOMY    . ESOPHAGOGASTRODUODENOSCOPY  07/28/2011   Procedure: ESOPHAGOGASTRODUODENOSCOPY (EGD);  Surgeon: Freddy JakschWilliam M Outlaw, MD;  [facility];  Service: Endoscopy;  Laterality: N/A;  . HERNIA REPAIR     Family History:  Family History  Problem Relation Age of Onset  . Hypertension Father   . Aneurysm Father   . Cancer Other   . Hypertension Mother   . Pneumonia Mother    Family Psychiatric  History: None reported Tobacco Screening: Have you used any form of tobacco in the last 30 days? (Cigarettes, Smokeless Tobacco, Cigars, and/or Pipes): No Social History:  Social History   Substance and Sexual Activity  Alcohol Use Yes  . Alcohol/week: 50.0 standard drinks  . Types: 50 Cans of beer per week   Comment: Equivalent of two 40 oz beers daily.     Social History   Substance and Sexual Activity  Drug Use No    Additional Social History:                           Allergies:   Allergies  Allergen Reactions  . Grapefruit Concentrate Hives  . Grapefruit Diet Or [Extra Strength Grapefruit] Hives    Pt states he has a reaction to grapefruit.    Lab Results:  Results for orders placed or performed during the hospital encounter of 12/19/20 (from the past 48 hour(s))  Comprehensive metabolic panel     Status: Abnormal   Collection Time: 12/19/20  3:31 AM  Result Value Ref Range   Sodium 136 135 - 145 mmol/L   Potassium 3.5 3.5 - 5.1 mmol/L   Chloride 102 98 - 111 mmol/L   CO2 24 22 - 32 mmol/L   Glucose, Bld 114 (H) 70 - 99 mg/dL     Comment: Glucose reference range applies only to samples taken after fasting for at least 8 hours.   BUN 9 6 - 20 mg/dL   Creatinine, Ser [id number] 0.61 - 1.24 mg/dL   Calcium 9.1 8.9 - 95.610.3 mg/dL   Total Protein 7.5 6.5 - 8.1 g/dL   Albumin 4.3 3.5 - 5.0 g/dL   AST 48 (H) 15 - 41 U/L   ALT 48 (H) 0 - 44 U/L   Alkaline Phosphatase 37 (L) 38 - 126 U/L   Total Bilirubin 1.3 (H) 0.3 - 1.2 mg/dL   GFR, Estimated >21>60 >30>60 mL/min    Comment: (NOTE) Calculated using the CKD-EPI Creatinine Equation (2021)    Anion gap 10 5 - 15    Comment: Performed at [facility], [address] [place], [place] [address]  Ethanol     Status: None   Collection Time: 12/19/20  3:31 AM  Result Value Ref Range  Alcohol, Ethyl (B) <10 <10 mg/dL    Comment: (NOTE) Lowest detectable limit for serum alcohol is 10 mg/dL.  For medical purposes only. Performed at [facility], [address] [place], [place] [address]   CBC with Diff     Status: None   Collection Time: 12/19/20  3:31 AM  Result Value Ref Range   WBC 7.2 4.0 - 10.5 K/uL   RBC 4.86 4.22 - 5.81 MIL/uL   Hemoglobin 14.5 13.0 - 17.0 g/dL   HCT 60.4 54.0 - 98.1 %   MCV 88.1 80.0 - 100.0 fL   MCH 29.8 26.0 - 34.0 pg   MCHC 33.9 30.0 - 36.0 g/dL   RDW 19.1 47.8 - 29.5 %   Platelets 152 150 - 400 K/uL   nRBC 0.0 0.0 - 0.2 %   Neutrophils Relative % 66 %   Neutro Abs 4.8 1.7 - 7.7 K/uL   Lymphocytes Relative 23 %   Lymphs Abs 1.7 0.7 - 4.0 K/uL   Monocytes Relative 8 %   Monocytes Absolute 0.6 0.1 - 1.0 K/uL   Eosinophils Relative 1 %   Eosinophils Absolute 0.1 0.0 - 0.5 K/uL   Basophils Relative 1 %   Basophils Absolute 0.0 0.0 - 0.1 K/uL   Immature Granulocytes 1 %   Abs Immature Granulocytes 0.04 0.00 - 0.07 K/uL    Comment: Performed at [facility], [address] [place], [place] [address]  Salicylate level     Status: Abnormal   Collection Time: 12/19/20  3:31 AM  Result Value Ref Range   Salicylate  Lvl <7.0 (L) 7.0 - 30.0 mg/dL    Comment: Performed at [facility], [address] [place], [place] [address]  Acetaminophen level     Status: Abnormal   Collection Time: 12/19/20  3:31 AM  Result Value Ref Range   Acetaminophen (Tylenol), Serum <10 (L) 10 - 30 ug/mL    Comment: (NOTE) Therapeutic concentrations vary significantly. A range of 10-30 ug/mL  may be an effective concentration for many patients. However, some  are best treated at concentrations outside of this range. Acetaminophen concentrations >150 ug/mL at 4 hours after ingestion  and >50 ug/mL at 12 hours after ingestion are often associated with  toxic reactions.  Performed at [facility], [address] [place], [place] [address]   Troponin I (High Sensitivity)     Status: None   Collection Time: 12/19/20  3:31 AM  Result Value Ref Range   Troponin I (High Sensitivity) 6 <18 ng/L    Comment: (NOTE) Elevated high sensitivity troponin I (hsTnI) values and significant  changes across serial measurements may suggest ACS but many other  chronic and acute conditions are known to elevate hsTnI results.  Refer to the "Links" section for chest pain algorithms and additional  guidance. Performed at [facility], [address] [place], [place] [address]   Resp Panel by RT-PCR (Flu A&B, Covid) Nasopharyngeal Swab     Status: None   Collection Time: 12/19/20  4:21 AM   Specimen: Nasopharyngeal Swab; Nasopharyngeal(NP) swabs in vial transport medium  Result Value Ref Range   SARS Coronavirus 2 by RT PCR NEGATIVE NEGATIVE    Comment: (NOTE) SARS-CoV-2 target nucleic acids are NOT DETECTED.  The SARS-CoV-2 RNA is generally detectable in upper respiratory specimens during the acute phase of infection. The lowest concentration of SARS-CoV-2 viral copies this assay can detect is 138 copies/mL. A negative result does not preclude  SARS-Cov-2 infection and should not be used as the sole basis  for treatment or other patient management decisions. A negative result may occur with  improper specimen collection/handling, submission of specimen other than nasopharyngeal swab, presence of viral mutation(s) within the areas targeted by this assay, and inadequate number of viral copies(<138 copies/mL). A negative result must be combined with clinical observations, patient history, and epidemiological information. The expected result is Negative.  Fact Sheet for Patients:  BloggerCourse.com  Fact Sheet for Healthcare Providers:  SeriousBroker.it  This test is no t yet approved or cleared by the [place] FDA and  has been authorized for detection and/or diagnosis of SARS-CoV-2 by FDA under an Emergency Use Authorization (EUA). This EUA will remain  in effect (meaning this test can be used) for the duration of the COVID-19 declaration under Section 564(b)(1) of the Act, 21 U.S.C.section 360bbb-3(b)(1), unless the authorization is terminated  or revoked sooner.       Influenza A by PCR NEGATIVE NEGATIVE   Influenza B by PCR NEGATIVE NEGATIVE    Comment: (NOTE) The Xpert Xpress SARS-CoV-2/FLU/RSV plus assay is intended as an aid in the diagnosis of influenza from Nasopharyngeal swab specimens and should not be used as a sole basis for treatment. Nasal washings and aspirates are unacceptable for Xpert Xpress SARS-CoV-2/FLU/RSV testing.  Fact Sheet for Patients: BloggerCourse.com  Fact Sheet for Healthcare Providers: SeriousBroker.it  This test is not yet approved or cleared by the [place] FDA and has been authorized for detection and/or diagnosis of SARS-CoV-2 by FDA under an Emergency Use Authorization (EUA). This EUA will remain in effect (meaning this test can be used) for the duration of the COVID-19 declaration under Section 564(b)(1) of the Act, 21  U.S.C. section 360bbb-3(b)(1), unless the authorization is terminated or revoked.  Performed at [facility], [address] [place], [place] [address]   Urine Drug Screen, Qualitative     Status: None   Collection Time: 12/19/20  5:00 AM  Result Value Ref Range   Tricyclic, Ur Screen NONE DETECTED NONE DETECTED   Amphetamines, Ur Screen NONE DETECTED NONE DETECTED   MDMA (Ecstasy)Ur Screen NONE DETECTED NONE DETECTED   Cocaine Metabolite,Ur [place] NONE DETECTED NONE DETECTED   Opiate, Ur Screen NONE DETECTED NONE DETECTED   Phencyclidine (PCP) Ur S NONE DETECTED NONE DETECTED   Cannabinoid 50 Ng, Ur [place] NONE DETECTED NONE DETECTED   Barbiturates, Ur Screen NONE DETECTED NONE DETECTED   Benzodiazepine, Ur Scrn NONE DETECTED NONE DETECTED   Methadone Scn, Ur NONE DETECTED NONE DETECTED    Comment: (NOTE) Tricyclics + metabolites, urine    Cutoff 1000 ng/mL Amphetamines + metabolites, urine  Cutoff 1000 ng/mL MDMA (Ecstasy), urine              Cutoff 500 ng/mL Cocaine Metabolite, urine          Cutoff 300 ng/mL Opiate + metabolites, urine        Cutoff 300 ng/mL Phencyclidine (PCP), urine         Cutoff 25 ng/mL Cannabinoid, urine                 Cutoff 50 ng/mL Barbiturates + metabolites, urine  Cutoff 200 ng/mL Benzodiazepine, urine              Cutoff 200 ng/mL Methadone, urine                   Cutoff 300 ng/mL  The urine drug screen  provides only a preliminary, unconfirmed analytical test result and should not be used for non-medical purposes. Clinical consideration and professional judgment should be applied to any positive drug screen result due to possible interfering substances. A more specific alternate chemical method must be used in order to obtain a confirmed analytical result. Gas chromatography / mass spectrometry (GC/MS) is the preferred confirm atory method. Performed at [facility], [address] [place], [place] [address]     Blood  Alcohol level:  Lab Results  Component Value Date   ETH <10 12/19/2020   ETH 341 (HH) 10/30/2020    Metabolic Disorder Labs:  Lab Results  Component Value Date   HGBA1C 5.7 (H) 05/30/2020   No results found for: PROLACTIN Lab Results  Component Value Date   CHOL 206 (H) 05/30/2020   TRIG 187 (H) 05/30/2020   HDL 28 (L) 05/30/2020   CHOLHDL 7.4 (H) 05/30/2020   LDLCALC 144 (H) 05/30/2020   LDLCALC 98 06/28/2013    Current Medications: Current Facility-Administered Medications  Medication Dose Route Frequency Provider Last Rate Last Admin  . acetaminophen (TYLENOL) tablet 650 mg  650 mg Oral Q6H PRN [name], [name], MD      . albuterol (PROVENTIL) (2.5 MG/3ML) 0.083% nebulizer solution 3 mL  3 mL Inhalation Q4H PRN [name], [name], MD      . alum & mag hydroxide-simeth (MAALOX/MYLANTA) 200-200-20 MG/5ML suspension 30 mL  30 mL Oral Q4H PRN [name], [name], MD      . hydrOXYzine (ATARAX/VISTARIL) tablet 25 mg  25 mg Oral Q6H PRN [name], NP      . loperamide (IMODIUM) capsule 2-4 mg  2-4 mg Oral PRN [name], NP      . loratadine (CLARITIN) tablet 10 mg  10 mg Oral Daily [name], [name], MD   10 mg at 12/20/20 0824  . LORazepam (ATIVAN) tablet 1 mg  1 mg Oral Q6H PRN [name], NP   1 mg at 12/20/20 [phone]  . LORazepam (ATIVAN) tablet 1 mg  1 mg Oral QID [name], NP   1 mg at 12/20/20 1205   Followed by  . [START ON 12/21/2020] LORazepam (ATIVAN) tablet 1 mg  1 mg Oral TID [name], NP       Followed by  . [START ON 12/22/2020] LORazepam (ATIVAN) tablet 1 mg  1 mg Oral BID [name], NP       Followed by  . [START ON 12/24/2020] LORazepam (ATIVAN) tablet 1 mg  1 mg Oral Daily [name], NP      . magnesium hydroxide (MILK OF MAGNESIA) suspension 30 mL  30 mL Oral Daily PRN [name], [name], MD      . nicotine polacrilex (NICORETTE) gum 2 mg  2 mg Oral Q4H PRN [name], MD      . ondansetron  (ZOFRAN-ODT) disintegrating tablet 4 mg  4 mg Oral Q6H PRN [name], NP      . thiamine (B-1) injection 100 mg  100 mg Intramuscular Once [name], NP      . Melene Muller ON 12/21/2020] thiamine tablet 100 mg  100 mg Oral Daily [name], NP       PTA Medications: Medications Prior to Admission  Medication Sig Dispense Refill Last Dose  . buPROPion (WELLBUTRIN XL) 150 MG 24 hr tablet Take 1 tablet (150 mg total) by mouth daily. 7 tablet 0   . hydrOXYzine (ATARAX/VISTARIL) 50 MG tablet Take 1 tablet (  50 mg total) by mouth every 6 (six) hours as needed for anxiety (sleep). 28 tablet 0   . mirtazapine (REMERON) 45 MG tablet Take 1 tablet (45 mg total) by mouth at bedtime. 7 tablet 0   . naltrexone (DEPADE) 50 MG tablet Take 1 tablet (50 mg total) by mouth daily. 7 tablet 0   . thiamine (VITAMIN B-1) 100 MG tablet Take 1 tablet (100 mg total) by mouth daily. 7 tablet 0     Musculoskeletal: Strength & Muscle Tone: within normal limits Gait & Station: normal Patient leans: N/A            Psychiatric Specialty Exam:  Presentation  General Appearance: Casual  Eye Contact:Fair  Speech:Clear and Coherent  Speech Volume:Normal  Handedness:Right   Mood and Affect  Mood:Depressed  Affect:Congruent   Thought Process  Thought Processes:Goal Directed  Duration of Psychotic Symptoms: No data recorded Past Diagnosis of Schizophrenia or Psychoactive disorder: No  Descriptions of Associations:Intact  Orientation:Full (Time, Place and Person)  Thought Content:Logical  Hallucinations:Hallucinations: None  Ideas of Reference:None  Suicidal Thoughts:Suicidal Thoughts: Yes, Passive SI Passive Intent and/or Plan: With Intent; With Plan  Homicidal Thoughts:Homicidal Thoughts: No   Sensorium  Memory:Immediate Good; Recent Good; Remote Good  Judgment:Impaired  Insight:Present   Executive Functions  Concentration:Fair  Attention  Span:Fair  Recall:Fair  Fund of Knowledge:Fair  Language:Fair   Psychomotor Activity  Psychomotor Activity:Psychomotor Activity: Normal   Assets  Assets:Communication Skills; Desire for Improvement; Resilience   Sleep  Sleep:Sleep: Poor Number of Hours of Sleep: 4.75    Physical Exam: Physical Exam Vitals and nursing note reviewed.  Constitutional:      Appearance: Normal appearance.  HENT:     Head: Normocephalic and atraumatic.     Right Ear: External ear normal.     Left Ear: External ear normal.     Nose: Nose normal.     Mouth/Throat:     Mouth: Mucous membranes are moist.     Pharynx: Oropharynx is clear.  Eyes:     Extraocular Movements: Extraocular movements intact.     Conjunctiva/sclera: Conjunctivae normal.     Pupils: Pupils are equal, round, and reactive to light.  Cardiovascular:     Rate and Rhythm: Normal rate.     Pulses: Normal pulses.  Pulmonary:     Effort: Pulmonary effort is normal.     Breath sounds: Normal breath sounds.  Abdominal:     General: Abdomen is flat.     Palpations: Abdomen is soft.  Musculoskeletal:        General: No swelling. Normal range of motion.     Cervical back: Normal range of motion and neck supple.  Skin:    General: Skin is warm and dry.  Neurological:     General: No focal deficit present.     Mental Status: He is alert and oriented to person, place, and time.  Psychiatric:        Attention and Perception: Perception normal.        Mood and Affect: Mood is depressed. Affect is tearful.        Speech: Speech normal.        Behavior: Behavior is withdrawn.        Thought Content: Thought content includes suicidal ideation.        Cognition and Memory: Cognition and memory normal.        Judgment: Judgment is inappropriate.    Review of Systems  Constitutional: Positive for malaise/fatigue.  Negative for fever.  HENT: Negative.   Eyes: Negative.   Respiratory: Negative.   Cardiovascular: Negative.    Gastrointestinal: Positive for diarrhea and nausea. Negative for abdominal pain and vomiting.  Genitourinary: Negative.   Musculoskeletal: Negative.   Skin: Negative.   Neurological: Positive for tremors. Negative for seizures.  Endo/Heme/Allergies: Positive for environmental allergies. Does not bruise/bleed easily.  Psychiatric/Behavioral: Positive for depression, substance abuse and suicidal ideas. Negative for hallucinations and memory loss. The patient is nervous/anxious and has insomnia.    Blood pressure (!) 137/91, pulse 98, temperature 98.7 F (37.1 C), temperature source Oral, resp. rate 18, height 5\' 9"  (1.753 m), weight 83.9 kg, SpO2 100 %. Body mass index is 27.31 kg/m.  Treatment Plan Summary: Daily contact with patient to assess and evaluate symptoms and progress in treatment and Medication management   1)MDD, recurrent, severe without psychotic features- established problem, severe - Restart Remeron 45 mg QHS  - Restart  Wellbutrin XL 150 mg daily   2) Alcohol Use Disorder, severe with acute alcohol withdrawals - CIWA protocol, continue MVI, thiamine, folic acid - Restart Naltrexone 50 mg daily  3) HTN- established problem - Amlodipine 5 mg daily  Observation Level/Precautions:  Detox  Laboratory:  Completed in ED  Psychotherapy:    Medications:    Consultations:    Discharge Concerns:    Estimated LOS:  Other:     Physician Treatment Plan for Primary Diagnosis: Severe recurrent major depression without psychotic features (HCC) Long Term Goal(s): Improvement in symptoms so as ready for discharge  Short Term Goals: Ability to identify changes in lifestyle to reduce recurrence of condition will improve, Ability to verbalize feelings will improve, Ability to disclose and discuss suicidal ideas, Ability to demonstrate self-control will improve, Ability to identify and develop effective coping behaviors will improve, Ability to maintain clinical measurements within  normal limits will improve, Compliance with prescribed medications will improve and Ability to identify triggers associated with substance abuse/mental health issues will improve  Physician Treatment Plan for Secondary Diagnosis: Principal Problem:   Severe recurrent major depression without psychotic features (HCC) Active Problems:   Hypertension   Cocaine abuse (HCC)   Severe alcohol use disorder (HCC)  Long Term Goal(s): Improvement in symptoms so as ready for discharge  Short Term Goals: Ability to identify changes in lifestyle to reduce recurrence of condition will improve, Ability to verbalize feelings will improve, Ability to disclose and discuss suicidal ideas, Ability to demonstrate self-control will improve, Ability to identify and develop effective coping behaviors will improve, Ability to maintain clinical measurements within normal limits will improve, Compliance with prescribed medications will improve and Ability to identify triggers associated with substance abuse/mental health issues will improve  I certify that inpatient services furnished can reasonably be expected to improve the patient's condition.    , MD 5/26/20222:14 PM

## 2020-12-20 NOTE — Progress Notes (Signed)
Patient admitted from [facility], report received from [place], [place]. Patient calm and cooperative during assessment denying HI/AVH. Patient endorses passive SI but verbally contracts for safety. Patient oriented to the unit and his room. Patient known by this Clinical research associate from previous admissions. Patient given education, support, and encouragement to be active in his treatment plan. Patient stated that when he left last time the outpatient treatment didn't work for him and he started drinking again leading to his SI. Patient being monitored Q 15 minutes for safety per unit protocol. Pt remains safe on the unit.

## 2020-12-21 DIAGNOSIS — F332 Major depressive disorder, recurrent severe without psychotic features: Secondary | ICD-10-CM | POA: Diagnosis not present

## 2020-12-21 MED ORDER — HYDROXYZINE HCL 50 MG PO TABS
50.0000 mg | ORAL_TABLET | Freq: Every day | ORAL | Status: DC
  Administered 2020-12-21 – 2020-12-26 (×6): 50 mg via ORAL
  Filled 2020-12-21 (×7): qty 1

## 2020-12-21 MED ORDER — HYDROXYZINE HCL 25 MG PO TABS
25.0000 mg | ORAL_TABLET | Freq: Four times a day (QID) | ORAL | Status: DC | PRN
Start: 2020-12-21 — End: 2020-12-21

## 2020-12-21 MED ORDER — HYDROXYZINE HCL 25 MG PO TABS: 25.0000 mg | ORAL_TABLET | Freq: Four times a day (QID) | ORAL | Status: AC | PRN

## 2020-12-21 NOTE — Progress Notes (Signed)
[facility] MD Progress Note  12/21/2020 11:10 AM CHAIS FEHRINGER  MRN:  [id number]   CC "Still feeling bad"  Subjective: 50 year old male presenting to the hospital due to worsening depression, relapse on alcohol, and suicidal ideations.  No acute events overnight, medication compliant, attending to ADLs.  Patient seen during treatment team and reports that his goal is to start feeling better, not has suicidal thoughts, and to go to residential substance abuse treatment.  When seen one-on-one he does report some continued withdrawal symptoms including poor sleep, diarrhea, and tremor.  He notes he was given trazodone, but feels that it makes his heart pounding.  He requests Vistaril for sleep instead.  He is given information on the [facility], and feels that he may be interested in this program.  He continues to have passive suicidal ideations, but contracts for safety.  He denies homicidal ideations, visual hallucinations, auditory hallucinations. Principal Problem: Severe recurrent major depression without psychotic features (HCC) Diagnosis: Principal Problem:   Severe recurrent major depression without psychotic features (HCC) Active Problems:   Hypertension   Cocaine abuse (HCC)   Severe alcohol use disorder (HCC)  Total Time spent with patient: 30 minutes  Past Psychiatric History: See H&P  Past Medical History:  Past Medical History:  Diagnosis Date  . Alcohol-induced pancreatitis   . Anemia   . Bronchitis     Past Surgical History:  Procedure Laterality Date  . CYSTECTOMY    . ESOPHAGOGASTRODUODENOSCOPY  07/28/2011   Procedure: ESOPHAGOGASTRODUODENOSCOPY (EGD);  Surgeon: Freddy Jaksch, MD;  Location: [facility] ENDOSCOPY;  Service: Endoscopy;  Laterality: N/A;  . HERNIA REPAIR     Family History:  Family History  Problem Relation Age of Onset  . Hypertension Father   . Aneurysm Father   . Cancer Other   . Hypertension Mother   . Pneumonia Mother    Family Psychiatric   History: See H&P Social History:  Social History   Substance and Sexual Activity  Alcohol Use Yes  . Alcohol/week: 50.0 standard drinks  . Types: 50 Cans of beer per week   Comment: Equivalent of two 40 oz beers daily.     Social History   Substance and Sexual Activity  Drug Use No    Social History   Socioeconomic History  . Marital status: Single    Spouse name: Not on file  . Number of children: Not on file  . Years of education: Not on file  . Highest education level: Not on file  Occupational History  . Not on file  Tobacco Use  . Smoking status: Current Every Day Smoker    Packs/day: 0.50    Years: 15.00    Pack years: 7.50    Types: Cigarettes    Last attempt to quit: 06/27/2011    Years since quitting: 9.4  . Smokeless tobacco: Never Used  Substance and Sexual Activity  . Alcohol use: Yes    Alcohol/week: 50.0 standard drinks    Types: 50 Cans of beer per week    Comment: Equivalent of two 40 oz beers daily.  . Drug use: No  . Sexual activity: Yes  Other Topics Concern  . Not on file  Social History Narrative  . Not on file   Social Determinants of Health   Financial Resource Strain: Not on file  Food Insecurity: Not on file  Transportation Needs: Not on file  Physical Activity: Not on file  Stress: Not on file  Social Connections: Not on file  Additional Social History:                         Sleep: Fair  Appetite:  Fair  Current Medications: Current Facility-Administered Medications  Medication Dose Route Frequency Provider Last Rate Last Admin  . acetaminophen (TYLENOL) tablet 650 mg  650 mg Oral Q6H PRN [name], [name], MD      . albuterol (PROVENTIL) (2.5 MG/3ML) 0.083% nebulizer solution 3 mL  3 mL Inhalation Q4H PRN [name], [name], MD      . alum & mag hydroxide-simeth (MAALOX/MYLANTA) 200-200-20 MG/5ML suspension 30 mL  30 mL Oral Q4H PRN [name], [name], MD      . amLODipine (NORVASC) tablet 5 mg  5 mg Oral Daily  [name], MD   5 mg at 12/21/20 0806  . buPROPion (WELLBUTRIN XL) 24 hr tablet 150 mg  150 mg Oral Daily [name], MD   150 mg at 12/21/20 0806  . folic acid (FOLVITE) tablet 1 mg  1 mg Oral Daily [name], MD   1 mg at 12/21/20 8101  . hydrOXYzine (ATARAX/VISTARIL) tablet 25 mg  25 mg Oral Q6H PRN [name], MD      . hydrOXYzine (ATARAX/VISTARIL) tablet 50 mg  50 mg Oral QHS [name], MD      . loperamide (IMODIUM) capsule 2-4 mg  2-4 mg Oral PRN [name], NP      . loratadine (CLARITIN) tablet 10 mg  10 mg Oral Daily [name], [name], MD   10 mg at 12/21/20 0806  . LORazepam (ATIVAN) tablet 1 mg  1 mg Oral QID [name], NP   1 mg at 12/21/20 7510   Followed by  . LORazepam (ATIVAN) tablet 1 mg  1 mg Oral TID [name], NP       Followed by  . [START ON 12/22/2020] LORazepam (ATIVAN) tablet 1 mg  1 mg Oral BID [name], NP       Followed by  . [START ON 12/24/2020] LORazepam (ATIVAN) tablet 1 mg  1 mg Oral Daily [name], NP      . magnesium hydroxide (MILK OF MAGNESIA) suspension 30 mL  30 mL Oral Daily PRN [name], [name], MD      . mirtazapine (REMERON) tablet 45 mg  45 mg Oral QHS [name], MD   45 mg at 12/20/20 2111  . multivitamin with minerals tablet 1 tablet  1 tablet Oral Daily [name], MD   1 tablet at 12/21/20 0805  . naltrexone (DEPADE) tablet 50 mg  50 mg Oral Daily [name], MD      . nicotine polacrilex (NICORETTE) gum 2 mg  2 mg Oral Q4H PRN [name], MD   2 mg at 12/20/20 1637  . ondansetron (ZOFRAN-ODT) disintegrating tablet 4 mg  4 mg Oral Q6H PRN [name], NP      . thiamine (B-1) injection 100 mg  100 mg Intramuscular Once [name], NP      . thiamine tablet 100 mg  100 mg Oral Daily [name], NP   100 mg at 12/21/20 2585    Lab Results: No results found for this or any previous visit (from the past 48  hour(s)).  Blood Alcohol level:  Lab Results  Component Value Date   ETH <10 12/19/2020   ETH 341 (HH) 10/30/2020    Metabolic Disorder Labs:  Lab Results  Component Value Date   HGBA1C 5.7 (H) 05/30/2020   No results found for: PROLACTIN Lab Results  Component Value Date   CHOL 206 (H) 05/30/2020   TRIG 187 (H) 05/30/2020   HDL 28 (L) 05/30/2020   CHOLHDL 7.4 (H) 05/30/2020   LDLCALC 144 (H) 05/30/2020   LDLCALC 98 06/28/2013    Physical Findings: AIMS:  , ,  ,  ,    CIWA:  CIWA-Ar Total: 3 COWS:     Musculoskeletal: Strength & Muscle Tone: within normal limits Gait & Station: normal Patient leans: N/A  Psychiatric Specialty Exam:  Presentation  General Appearance: Casual  Eye Contact:Fair  Speech:Clear and Coherent  Speech Volume:Normal  Handedness:Right   Mood and Affect  Mood:Depressed  Affect:Congruent   Thought Process  Thought Processes:Goal Directed  Descriptions of Associations:Intact  Orientation:Full (Time, Place and Person)  Thought Content:Logical  History of Schizophrenia/Schizoaffective disorder:No  Duration of Psychotic Symptoms:No data recorded Hallucinations:Hallucinations: None  Ideas of Reference:None  Suicidal Thoughts:Suicidal Thoughts: Yes, Passive  Homicidal Thoughts:Homicidal Thoughts: No   Sensorium  Memory:Immediate Good; Recent Good; Remote Good  Judgment:Impaired  Insight:Shallow   Executive Functions  Concentration:Fair  Attention Span:Fair  Recall:Fair  Fund of Knowledge:Fair  Language:Fair   Psychomotor Activity  Psychomotor Activity:Psychomotor Activity: Normal   Assets  Assets:Communication Skills; Desire for Improvement; Resilience   Sleep  Sleep:Sleep: Fair Number of Hours of Sleep: 7.45    Physical Exam: Physical Exam ROS Blood pressure (!) 135/98, pulse 85, temperature 98.1 F (36.7 C), temperature source Oral, resp. rate 17, height 5\' 9"  (1.753 m), weight 83.9 kg,  SpO2 98 %. Body mass index is 27.31 kg/m.   Treatment Plan Summary: Daily contact with patient to assess and evaluate symptoms and progress in treatment and Medication management  1)MDD, recurrent, severe without psychotic features-established problem, severe - Continue Remeron 45 mg QHS  - Continue Wellbutrin XL 150 mg daily   2) Alcohol Use Disorder, severe with acute alcohol withdrawals - Ativan taper, continue MVI, thiamine, folic acid - Continue Naltrexone 50 mg daily  3) HTN- established problem - Amlodipine 5 mg daily started yesterday, titrate PRN    , MD 12/21/2020, 11:10 AM

## 2020-12-21 NOTE — [facility] Group Notes (Signed)
LCSW Group Therapy Note  12/21/2020 11:01 AM  Type of Therapy and Topic:  Group Therapy:  Feelings around Relapse and Recovery  Participation Level:  Active   Description of Group:    Patients in this group will discuss emotions they experience before and after a relapse. They will process how experiencing these feelings, or avoidance of experiencing them, relates to having a relapse. Facilitator will guide patients to explore emotions they have related to recovery. Patients will be encouraged to process which emotions are more powerful. They will be guided to discuss the emotional reaction significant others in their lives may have to their relapse or recovery. Patients will be assisted in exploring ways to respond to the emotions of others without this contributing to a relapse.  Therapeutic Goals: 1. Patient will identify two or more emotions that lead to a relapse for them 2. Patient will identify two emotions that result when they relapse 3. Patient will identify two emotions related to recovery 4. Patient will demonstrate ability to communicate their needs through discussion and/or role plays   Summary of Patient Progress: Patient was present for group.  Patient was an active participant in group.  Patient shared that he has struggled with addiction.  He shared with others.    Therapeutic Modalities:   Cognitive Behavioral Therapy Solution-Focused Therapy Assertiveness Training Relapse Prevention Therapy   Penni Homans, MSW, LCSW 12/21/2020 11:01 AM

## 2020-12-21 NOTE — [facility] Suicide Risk Assessment (Signed)
[facility] INPATIENT:  Family/Significant Other Suicide Prevention Education  Suicide Prevention Education:  Contact Attempts: Jessee Mezera [phone]), has been identified by the patient as the family member/significant other with whom the patient will be residing, and identified as the person(s) who will aid the patient in the event of a mental health crisis.  With written consent from the patient, two attempts were made to provide suicide prevention education, prior to and/or following the patient's discharge.  We were unsuccessful in providing suicide prevention education.  A suicide education pamphlet was given to the patient to share with family/significant other.  Date and time of first attempt: 12/20/20 at 3:51pm Date and time of second attempt: 12/21/20 at 10:52am  HIPPA compliant voicemail left with contact information for follow through.  Vilma Meckel. [name], MSW, LCSW, LCAS 12/21/2020 10:53 AM

## 2020-12-21 NOTE — Progress Notes (Signed)
Recreation Therapy Notes  INPATIENT RECREATION TR PLAN  Patient Details Name: Lee Sparks MRN: [id number] DOB: [birth year] Today's Date: 12/21/2020  Rec Therapy Plan Is patient appropriate for Therapeutic Recreation?: Yes Treatment times per week: at least 3 Estimated Length of Stay: 5-7 days TR Treatment/Interventions: Group participation (Comment)  Discharge Criteria Pt will be discharged from therapy if:: Discharged Treatment plan/goals/alternatives discussed and agreed upon by:: Patient/family  Discharge Summary     Louvenia Golomb 12/21/2020, 2:16 PM

## 2020-12-21 NOTE — [facility] Suicide Risk Assessment (Signed)
[facility] INPATIENT:  Family/Significant Other Suicide Prevention Education  Suicide Prevention Education:  Education Completed; Remi Lopata [phone]), has been identified by the patient as the family member/significant other with whom the patient will be residing, and identified as the person(s) who will aid the patient in the event of a mental health crisis (suicidal ideations/suicide attempt).  With written consent from the patient, the family member/significant other has been provided the following suicide prevention education, prior to the and/or following the discharge of the patient.  The suicide prevention education provided includes the following:  Suicide risk factors  Suicide prevention and interventions  National Suicide Hotline telephone number  [facility] assessment telephone number  [facility] Emergency Assistance 911  [facility] and/or Residential Mobile Crisis Unit telephone number  Request made of family/significant other to:  Remove weapons (e.g., guns, rifles, knives), all items previously/currently identified as safety concern.    Remove drugs/medications (over-the-counter, prescriptions, illicit drugs), all items previously/currently identified as a safety concern.  The family member/significant other verbalizes understanding of the suicide prevention education information provided.  The family member/significant other agrees to remove the items of safety concern listed above.  Dolle was unaware that pt was in the hospital. CSW gave an update that pt was here due to alcohol use and SI. He stated that he was uncertain whether pt was a danger to himself but that he has never hurt anyone or himself. Basurto denies his brother being a danger to anyone else. He shared that alcoholism has been something that pt has struggled with all his life. Lem and CSW discussed contacting pt and the process was explained to him. No other concerns  expressed. Contact ended without incident.   Glenis Smoker 12/21/2020, 11:24 AM

## 2020-12-21 NOTE — [facility] Counselor (Signed)
CSW contacted [facility] to speak to Morene Rankins regarding pt. CSW was informed that Eulas Post was on vacation and would return on Monday. No other concerns expressed. Contact ended without incident.   CSW contacted [facility] regarding male bed availability. They have availability and requested that a referral form be completed. Contact ended without incident.   CSW met with pt to complete referral forms for [facility] [facility] and [facility]. He was informed that Ms. Eulas Post was on vacation and would return to work next week. Pt was informed that applications/referrals would be sent off and that they would be contacted to follow up in the following week. No other concerns expressed. Contact ended without incident.   Referrals were sent to [facility].  Chalmers Guest. Guerry Bruin, MSW, [place], [place] 12/21/2020 3:56 PM

## 2020-12-21 NOTE — [facility] Counselor (Signed)
CSW met with pt briefly to talk about referrals. Pt was given information on [facility]. CSW informed him that referral would be sent to [facility] but that [facility] may have a wait list of two to three weeks. Pt stated that he knows Ronny Flurry, who has some control over their admissions, and that previously when there was a situation like this one she had moved him along the list fairly quickly. CSW stated that it may be better for pt to call at that rate. Pt stated you just have to mention my name. CSW stated that [facility] would be contacted. No other concerns expressed. Contact ended without incident.   Chalmers Guest. Guerry Bruin, MSW, [place], [place] 12/21/2020 9:11 AM

## 2020-12-21 NOTE — Plan of Care (Signed)
Pt rates depression 8/10, hopelessness 1/10 and anxiety 8/10. Pt denies AVH and HI but has passive SI. Pt verbally contracts for safety. Pt was educated on care plan and verbalizes understanding. Torrie Mayers RN Problem: Education: Goal: Charity fundraiser Education information/materials will improve Outcome: Progressing Goal: Emotional status will improve Outcome: Not Progressing Goal: Mental status will improve Outcome: Not Progressing Goal: Verbalization of understanding the information provided will improve Outcome: Progressing   Problem: Safety: Goal: Periods of time without injury will increase Outcome: Progressing   Problem: Education: Goal: Ability to make informed decisions regarding treatment will improve Outcome: Progressing   Problem: Medication: Goal: Compliance with prescribed medication regimen will improve Outcome: Progressing

## 2020-12-21 NOTE — Tx Team (Signed)
Interdisciplinary Treatment and Diagnostic Plan Update  12/21/2020 Time of Session: 9:00AM Lee Sparks MRN: [id number]  Principal Diagnosis: Severe recurrent major depression without psychotic features [facility])  Secondary Diagnoses: Principal Problem:   Severe recurrent major depression without psychotic features ([name]) Active Problems:   Hypertension   Cocaine abuse ([name])   Severe alcohol use disorder ([name])   Current Medications:  Current Facility-Administered Medications  Medication Dose Route Frequency Provider Last Rate Last Admin  . acetaminophen (TYLENOL) tablet 650 mg  650 mg Oral Q6H PRN [name], [name], MD      . albuterol (PROVENTIL) (2.5 MG/3ML) 0.083% nebulizer solution 3 mL  3 mL Inhalation Q4H PRN [name], [name], MD      . alum & mag hydroxide-simeth (MAALOX/MYLANTA) 200-200-20 MG/5ML suspension 30 mL  30 mL Oral Q4H PRN [name], [name], MD      . amLODipine (NORVASC) tablet 5 mg  5 mg Oral Daily [name], MD   5 mg at 12/21/20 0806  . buPROPion (WELLBUTRIN XL) 24 hr tablet 150 mg  150 mg Oral Daily [name], MD   150 mg at 12/21/20 0806  . folic acid (FOLVITE) tablet 1 mg  1 mg Oral Daily [name], MD   1 mg at 12/21/20 1219  . hydrOXYzine (ATARAX/VISTARIL) tablet 25 mg  25 mg Oral Q6H PRN [name], NP   25 mg at 12/20/20 2017  . loperamide (IMODIUM) capsule 2-4 mg  2-4 mg Oral PRN [name], NP      . loratadine (CLARITIN) tablet 10 mg  10 mg Oral Daily [name], [name], MD   10 mg at 12/21/20 0806  . LORazepam (ATIVAN) tablet 1 mg  1 mg Oral Q6H PRN [name], NP   1 mg at 12/20/20 2111  . LORazepam (ATIVAN) tablet 1 mg  1 mg Oral QID [name], NP   1 mg at 12/21/20 7588   Followed by  . LORazepam (ATIVAN) tablet 1 mg  1 mg Oral TID [name], NP       Followed by  . [START ON 12/22/2020] LORazepam (ATIVAN) tablet 1 mg  1 mg Oral BID [name], NP       Followed by  . [START  ON 12/24/2020] LORazepam (ATIVAN) tablet 1 mg  1 mg Oral Daily [name], NP      . magnesium hydroxide (MILK OF MAGNESIA) suspension 30 mL  30 mL Oral Daily PRN [name], [name], MD      . mirtazapine (REMERON) tablet 45 mg  45 mg Oral QHS [name], MD   45 mg at 12/20/20 2111  . multivitamin with minerals tablet 1 tablet  1 tablet Oral Daily [name], MD   1 tablet at 12/21/20 0805  . naltrexone (DEPADE) tablet 50 mg  50 mg Oral Daily [name], MD      . nicotine polacrilex (NICORETTE) gum 2 mg  2 mg Oral Q4H PRN [name], MD   2 mg at 12/20/20 1637  . ondansetron (ZOFRAN-ODT) disintegrating tablet 4 mg  4 mg Oral Q6H PRN [name], NP      . thiamine (B-1) injection 100 mg  100 mg Intramuscular Once [name], NP      . thiamine tablet 100 mg  100 mg Oral Daily [name], NP   100 mg at 12/21/20 0805   PTA Medications: Medications Prior to Admission  Medication Sig Dispense Refill Last  Dose  . buPROPion (WELLBUTRIN XL) 150 MG 24 hr tablet Take 1 tablet (150 mg total) by mouth daily. 7 tablet 0   . hydrOXYzine (ATARAX/VISTARIL) 50 MG tablet Take 1 tablet (50 mg total) by mouth every 6 (six) hours as needed for anxiety (sleep). 28 tablet 0   . mirtazapine (REMERON) 45 MG tablet Take 1 tablet (45 mg total) by mouth at bedtime. 7 tablet 0   . naltrexone (DEPADE) 50 MG tablet Take 1 tablet (50 mg total) by mouth daily. 7 tablet 0   . thiamine (VITAMIN B-1) 100 MG tablet Take 1 tablet (100 mg total) by mouth daily. 7 tablet 0     Patient Stressors: Medication change or noncompliance Substance abuse  Patient Strengths: Ability for insight Motivation for treatment/growth  Treatment Modalities: Medication Management, Group therapy, Case management,  1 to 1 session with clinician, Psychoeducation, Recreational therapy.   Physician Treatment Plan for Primary Diagnosis: Severe recurrent major depression without psychotic  features ([name]) Long Term Goal(s): Improvement in symptoms so as ready for discharge Improvement in symptoms so as ready for discharge   Short Term Goals: Ability to identify changes in lifestyle to reduce recurrence of condition will improve Ability to verbalize feelings will improve Ability to disclose and discuss suicidal ideas Ability to demonstrate self-control will improve Ability to identify and develop effective coping behaviors will improve Ability to maintain clinical measurements within normal limits will improve Compliance with prescribed medications will improve Ability to identify triggers associated with substance abuse/mental health issues will improve Ability to identify changes in lifestyle to reduce recurrence of condition will improve Ability to verbalize feelings will improve Ability to disclose and discuss suicidal ideas Ability to demonstrate self-control will improve Ability to identify and develop effective coping behaviors will improve Ability to maintain clinical measurements within normal limits will improve Compliance with prescribed medications will improve Ability to identify triggers associated with substance abuse/mental health issues will improve  Medication Management: Evaluate patient's response, side effects, and tolerance of medication regimen.  Therapeutic Interventions: 1 to 1 sessions, Unit Group sessions and Medication administration.  Evaluation of Outcomes: Not Met  Physician Treatment Plan for Secondary Diagnosis: Principal Problem:   Severe recurrent major depression without psychotic features ([name]) Active Problems:   Hypertension   Cocaine abuse ([name])   Severe alcohol use disorder ([name])  Long Term Goal(s): Improvement in symptoms so as ready for discharge Improvement in symptoms so as ready for discharge   Short Term Goals: Ability to identify changes in lifestyle to reduce recurrence of condition will improve Ability to verbalize  feelings will improve Ability to disclose and discuss suicidal ideas Ability to demonstrate self-control will improve Ability to identify and develop effective coping behaviors will improve Ability to maintain clinical measurements within normal limits will improve Compliance with prescribed medications will improve Ability to identify triggers associated with substance abuse/mental health issues will improve Ability to identify changes in lifestyle to reduce recurrence of condition will improve Ability to verbalize feelings will improve Ability to disclose and discuss suicidal ideas Ability to demonstrate self-control will improve Ability to identify and develop effective coping behaviors will improve Ability to maintain clinical measurements within normal limits will improve Compliance with prescribed medications will improve Ability to identify triggers associated with substance abuse/mental health issues will improve     Medication Management: Evaluate patient's response, side effects, and tolerance of medication regimen.  Therapeutic Interventions: 1 to 1 sessions, Unit Group sessions and Medication administration.  Evaluation of  Outcomes: Not Met   RN Treatment Plan for Primary Diagnosis: Severe recurrent major depression without psychotic features ([place]) Long Term Goal(s): Knowledge of disease and therapeutic regimen to maintain health will improve  Short Term Goals: Ability to remain free from injury will improve, Ability to verbalize frustration and anger appropriately will improve, Ability to demonstrate self-control, Ability to participate in decision making will improve, Ability to verbalize feelings will improve, Ability to disclose and discuss suicidal ideas, Ability to identify and develop effective coping behaviors will improve and Compliance with prescribed medications will improve  Medication Management: RN will administer medications as ordered by provider, will assess  and evaluate patient's response and provide education to patient for prescribed medication. RN will report any adverse and/or side effects to prescribing provider.  Therapeutic Interventions: 1 on 1 counseling sessions, Psychoeducation, Medication administration, Evaluate responses to treatment, Monitor vital signs and CBGs as ordered, Perform/monitor CIWA, COWS, AIMS and Fall Risk screenings as ordered, Perform wound care treatments as ordered.  Evaluation of Outcomes: Not Met   LCSW Treatment Plan for Primary Diagnosis: Severe recurrent major depression without psychotic features ([place]) Long Term Goal(s): Safe transition to appropriate next level of care at discharge, Engage patient in therapeutic group addressing interpersonal concerns.  Short Term Goals: Engage patient in aftercare planning with referrals and resources, Increase social support, Increase ability to appropriately verbalize feelings, Increase emotional regulation, Facilitate acceptance of mental health diagnosis and concerns, Facilitate patient progression through stages of change regarding substance use diagnoses and concerns, Identify triggers associated with mental health/substance abuse issues and Increase skills for wellness and recovery  Therapeutic Interventions: Assess for all discharge needs, 1 to 1 time with Social worker, Explore available resources and support systems, Assess for adequacy in community support network, Educate family and significant other(s) on suicide prevention, Complete Psychosocial Assessment, Interpersonal group therapy.  Evaluation of Outcomes: Not Met   Progress in Treatment: Attending groups: No. Participating in groups: No. Taking medication as prescribed: Yes. Toleration medication: Yes. Family/Significant other contact made: Yes, individual(s) contacted:  attempt made to contact brother. Patient understands diagnosis: Yes. Discussing patient identified problems/goals with staff:  Yes. Medical problems stabilized or resolved: No. Denies suicidal/homicidal ideation: No. Issues/concerns per patient self-inventory: No. Other: none.  New problem(s) identified: No, Describe:  none.  New Short Term/Long Term Goal(s): detox,  medication management for mood stabilization; elimination of SI thoughts; development of comprehensive mental wellness/sobriety plan.  Patient Goals: "Get over the sickness, the feeling of it." Pt interested in residential treatment.   Discharge Plan or Barriers: Pt expressed interest in residential/inpatient treatment.   Reason for Continuation of Hospitalization: Depression Medication stabilization Suicidal ideation Withdrawal symptoms  Estimated Length of Stay: 1-7 days  Attendees: Patient: Lee Sparks 12/21/2020 9:00 AM  Physician: Selina Cooley, MD 12/21/2020 9:00 AM  Nursing: Collier Bullock, RN 12/21/2020 9:00 AM  RN Care Manager: 12/21/2020 9:00 AM  Social Worker: Chalmers Guest. Guerry Bruin, MSW, [place], [place] 12/21/2020 9:00 AM  Recreational Therapist:   12/21/2020 9:00 AM  Other: Assunta Curtis, MSW, LCSW 12/21/2020 9:00 AM  Other: Kiva Martinique, MSW, LCSW-A 12/21/2020 9:00 AM  Other: 12/21/2020 9:00 AM    Scribe for Treatment Team: Shirl Harris, LCSW 12/21/2020 9:00 AM

## 2020-12-21 NOTE — Progress Notes (Signed)
Recreation Therapy Notes   Date: 12/21/2020  Time: 10:00 am   Location: Craft room   Behavioral response: Appropriate  Intervention Topic: Creative expressions   Discussion/Intervention:  Group content on today was focused on creative expressions. The group defined creative expressions and ways they use creative expressions. Individual identified other positive ways creative expressions can be used and why it is important to express yourself. Patients participated in the intervention "expressive folding", where they had a chance to creatively express themselves. Clinical Observations/Feedback: Patient came to group and was focused on what peers and staff had to say about creative expressions. Individual was social with staff and peers while participating in the intervention. Shonika Kolasinski LRT/CTRS           Zonya Gudger 12/21/2020 12:10 PM

## 2020-12-21 NOTE — Progress Notes (Signed)
Recreation Therapy Notes  INPATIENT RECREATION THERAPY ASSESSMENT  Patient Details Name: Lee Sparks MRN: [id number] DOB: 04-23-1971 Today's Date: 12/21/2020       Information Obtained From: Patient  Able to Participate in Assessment/Interview: Yes  Patient Presentation: Responsive  Reason for Admission (Per Patient): Active Symptoms,Suicidal Ideation  Patient Stressors:    Coping Skills:   Isolation,Avoidance,Substance Abuse  Leisure Interests (2+):  Music - Play instrument,Music - Listen,Exercise - Walking (Bikes)  Frequency of Recreation/Participation: Monthly  Awareness of Community Resources:  Yes  Community Resources:     Current Use: No  If no, Barriers?:    Expressed Interest in [employer] Information: No  [place]idence[place]  Patient Main Form of Transportation: Therapist, music  Patient Strengths:  Holiday representative  Patient Identified Areas of Improvement:  a lot  Patient Goal for Hospitalization:  Getting better  Current SI (including self-harm):  Yes (No plan)  Current HI:  No  Current AVH: No  Staff Intervention Plan: Group Attendance,Collaborate with Interdisciplinary Treatment Team  Consent to Intern Participation: N/A  Jahnaya Branscome 12/21/2020, 2:16 PM

## 2020-12-22 DIAGNOSIS — F332 Major depressive disorder, recurrent severe without psychotic features: Secondary | ICD-10-CM | POA: Diagnosis not present

## 2020-12-22 NOTE — [facility] Group Notes (Signed)
LCSW Group Therapy Note  12/22/2020 10:23 AM  Type of Therapy and Topic:  Group Therapy: Avoiding Self-Sabotaging and Enabling Behaviors  Participation Level:  Did Not Attend   Description of Group:   In this group, patients will learn how to identify obstacles, self-sabotaging and enabling behaviors, as well as: what are they, why do we do them and what needs these behaviors meet. Discuss unhealthy relationships and how to have positive healthy boundaries with those that sabotage and enable. Explore aspects of self-sabotage and enabling in yourself and how to limit these self-destructive behaviors in everyday life.   Therapeutic Goals: 1. Patient will identify one obstacle that relates to self-sabotage and enabling behaviors 2. Patient will identify one personal self-sabotaging or enabling behavior they did prior to admission 3. Patient will state a plan to change the above identified behavior 4. Patient will demonstrate ability to communicate their needs through discussion and/or role play.   Summary of Patient Progress: X  Therapeutic Modalities:   Cognitive Behavioral Therapy Person-Centered Therapy Motivational Interviewing  Penni Homans, MSW, LCSW 12/22/2020 10:23 AM

## 2020-12-22 NOTE — Progress Notes (Signed)
Pt says that he feels "much better". Pt has been calm, social, med compliant and went to groups. Torrie Mayers RN

## 2020-12-22 NOTE — Progress Notes (Signed)
[facility] MD Progress Note  12/22/2020 2:52 PM Lee Sparks  MRN:  [id number]   Principal Problem: Severe recurrent major depression without psychotic features [facility]) Diagnosis: Principal Problem:   Severe recurrent major depression without psychotic features ([facility]) Active Problems:   Hypertension   Cocaine abuse ([facility])   Severe alcohol use disorder ([facility])  50 year old male presenting to the hospital due to worsening depression, relapse on alcohol, and suicidal ideations.  No acute events overnight, medication compliant, attending to ADLs.   Subjective:   Patient reports feeling better physically, although still having some continued withdrawal symptoms including anxiety and tremor.  He continues to report depressed mood and passive suicidal ideations, but contracts for safety.  He denies homicidal ideations, visual hallucinations, auditory hallucinations. He is still interested in residential treatment of substance use disorder.   Total Time spent with patient: 15 minutes  Past Psychiatric History: see H&P  Past Medical History:  Past Medical History:  Diagnosis Date  . Alcohol-induced pancreatitis   . Anemia   . Bronchitis     Past Surgical History:  Procedure Laterality Date  . CYSTECTOMY    . ESOPHAGOGASTRODUODENOSCOPY  07/28/2011   Procedure: ESOPHAGOGASTRODUODENOSCOPY (EGD);  Surgeon: Freddy Jaksch, MD;  Location: [facility] ENDOSCOPY;  Service: Endoscopy;  Laterality: N/A;  . HERNIA REPAIR     Family History:  Family History  Problem Relation Age of Onset  . Hypertension Father   . Aneurysm Father   . Cancer Other   . Hypertension Mother   . Pneumonia Mother    Family Psychiatric  History: see H&P Social History:  Social History   Substance and Sexual Activity  Alcohol Use Yes  . Alcohol/week: 50.0 standard drinks  . Types: 50 Cans of beer per week   Comment: Equivalent of two 40 oz beers daily.     Social History   Substance and Sexual Activity  Drug Use No     Social History   Socioeconomic History  . Marital status: Single    Spouse name: Not on file  . Number of children: Not on file  . Years of education: Not on file  . Highest education level: Not on file  Occupational History  . Not on file  Tobacco Use  . Smoking status: Current Every Day Smoker    Packs/day: 0.50    Years: 15.00    Pack years: 7.50    Types: Cigarettes    Last attempt to quit: 06/27/2011    Years since quitting: 9.4  . Smokeless tobacco: Never Used  Substance and Sexual Activity  . Alcohol use: Yes    Alcohol/week: 50.0 standard drinks    Types: 50 Cans of beer per week    Comment: Equivalent of two 40 oz beers daily.  . Drug use: No  . Sexual activity: Yes  Other Topics Concern  . Not on file  Social History Narrative  . Not on file   Social Determinants of Health   Financial Resource Strain: Not on file  Food Insecurity: Not on file  Transportation Needs: Not on file  Physical Activity: Not on file  Stress: Not on file  Social Connections: Not on file   Additional Social History:                         Sleep: Good  Appetite:  Good  Current Medications: Current Facility-Administered Medications  Medication Dose Route Frequency Provider Last Rate Last Admin  . acetaminophen (TYLENOL)  tablet 650 mg  650 mg Oral Q6H PRN [name], [name], MD      . albuterol (PROVENTIL) (2.5 MG/3ML) 0.083% nebulizer solution 3 mL  3 mL Inhalation Q4H PRN [name], [name], MD      . alum & mag hydroxide-simeth (MAALOX/MYLANTA) 200-200-20 MG/5ML suspension 30 mL  30 mL Oral Q4H PRN [name], [name], MD      . amLODipine (NORVASC) tablet 5 mg  5 mg Oral Daily [name], MD   5 mg at 12/22/20 0756  . buPROPion (WELLBUTRIN XL) 24 hr tablet 150 mg  150 mg Oral Daily [name], MD   150 mg at 12/22/20 0756  . folic acid (FOLVITE) tablet 1 mg  1 mg Oral Daily [name], MD   1 mg at 12/22/20 0756  . hydrOXYzine (ATARAX/VISTARIL) tablet 25  mg  25 mg Oral Q6H PRN [name], MD      . hydrOXYzine (ATARAX/VISTARIL) tablet 50 mg  50 mg Oral QHS [name], MD   50 mg at 12/21/20 2109  . loperamide (IMODIUM) capsule 2-4 mg  2-4 mg Oral PRN [name], NP      . loratadine (CLARITIN) tablet 10 mg  10 mg Oral Daily [name], [name], MD   10 mg at 12/22/20 0757  . LORazepam (ATIVAN) tablet 1 mg  1 mg Oral BID [name], NP       Followed by  . [START ON 12/24/2020] LORazepam (ATIVAN) tablet 1 mg  1 mg Oral Daily [name], NP      . magnesium hydroxide (MILK OF MAGNESIA) suspension 30 mL  30 mL Oral Daily PRN [name], [name], MD      . mirtazapine (REMERON) tablet 45 mg  45 mg Oral QHS [name], MD   45 mg at 12/21/20 2109  . multivitamin with minerals tablet 1 tablet  1 tablet Oral Daily [name], MD   1 tablet at 12/22/20 0756  . naltrexone (DEPADE) tablet 50 mg  50 mg Oral Daily [name], MD   50 mg at 12/22/20 0756  . nicotine polacrilex (NICORETTE) gum 2 mg  2 mg Oral Q4H PRN [name], MD   2 mg at 12/21/20 1408  . ondansetron (ZOFRAN-ODT) disintegrating tablet 4 mg  4 mg Oral Q6H PRN [name], NP      . thiamine (B-1) injection 100 mg  100 mg Intramuscular Once [name], NP      . thiamine tablet 100 mg  100 mg Oral Daily [name], NP   100 mg at 12/22/20 4132    Lab Results: No results found for this or any previous visit (from the past 48 hour(s)).  Blood Alcohol level:  Lab Results  Component Value Date   ETH <10 12/19/2020   ETH 341 (HH) 10/30/2020    Metabolic Disorder Labs: Lab Results  Component Value Date   HGBA1C 5.7 (H) 05/30/2020   No results found for: PROLACTIN Lab Results  Component Value Date   CHOL 206 (H) 05/30/2020   TRIG 187 (H) 05/30/2020   HDL 28 (L) 05/30/2020   CHOLHDL 7.4 (H) 05/30/2020   LDLCALC 144 (H) 05/30/2020   LDLCALC 98 06/28/2013    Physical Findings: AIMS:  , ,  ,  ,     CIWA:  CIWA-Ar Total: 1 COWS:     Musculoskeletal: Strength & Muscle Tone: within normal limits Gait & Station: normal Patient  leans: N/A  Psychiatric Specialty Exam:  Presentation  General Appearance: Casual  Eye Contact:Fair  Speech:Clear and Coherent  Speech Volume:Normal  Handedness:Right   Mood and Affect  Mood:Depressed  Affect:Congruent   Thought Process  Thought Processes:Goal Directed  Descriptions of Associations:Intact  Orientation:Full (Time, Place and Person)  Thought Content:Logical  History of Schizophrenia/Schizoaffective disorder:No  Duration of Psychotic Symptoms:No data recorded Hallucinations:Hallucinations: None  Ideas of Reference:None  Suicidal Thoughts:Suicidal Thoughts: Yes, Passive  Homicidal Thoughts:Homicidal Thoughts: No   Sensorium  Memory:Immediate Good; Recent Good; Remote Good  Judgment:Impaired  Insight:Shallow   Executive Functions  Concentration:Fair  Attention Span:Fair  Recall:Fair  Fund of Knowledge:Fair  Language:Fair   Psychomotor Activity  Psychomotor Activity:Psychomotor Activity: Normal   Assets  Assets:Communication Skills; Desire for Improvement; Resilience   Sleep  Sleep:Sleep: Fair Number of Hours of Sleep: 7.45    Physical Exam: Physical Exam ROS Blood pressure (!) 143/85, pulse (!) 103, temperature 98 F (36.7 C), temperature source Oral, resp. rate 17, height 5\' 9"  (1.753 m), weight 83.9 kg, SpO2 100 %. Body mass index is 27.31 kg/m.   Treatment Plan Summary: Daily contact with patient to assess and evaluate symptoms and progress in treatment and Medication management  Patient is a 50 year old male with the above-stated past psychiatric history who is seen in follow-up.  Chart reviewed. Patient discussed with nursing. Reports feeling better physically. Last CIWA was 1 due to anxiety. Mentally - still depressed, passive death wishes. No medication changes today, will  reassess tomorrow.     Plan:  -continue inpatient psych admission; 15-minute checks; daily contact with patient to assess and evaluate symptoms and progress in treatment; psychoeducation.  -continue scheduled medications: . amLODipine  5 mg Oral Daily  . buPROPion  150 mg Oral Daily  . folic acid  1 mg Oral Daily  . hydrOXYzine  50 mg Oral QHS  . loratadine  10 mg Oral Daily  . LORazepam  1 mg Oral BID   Followed by  . [START ON 12/24/2020] LORazepam  1 mg Oral Daily  . mirtazapine  45 mg Oral QHS  . multivitamin with minerals  1 tablet Oral Daily  . naltrexone  50 mg Oral Daily  . thiamine  100 mg Intramuscular Once  . thiamine  100 mg Oral Daily    -continue PRN medications. acetaminophen, albuterol, alum & mag hydroxide-simeth, hydrOXYzine, loperamide, magnesium hydroxide, nicotine polacrilex, ondansetron  -Disposition: patient is interested in residential SUD treatment.  -  I certify that the patient does need, on a daily basis, active treatment furnished directly by or requiring the supervision of inpatient psychiatric facility personnel.   12/26/2020, MD 12/22/2020, 2:52 PM

## 2020-12-22 NOTE — Progress Notes (Signed)
Patient is A & O x4.  He is attentive and medication compliant. He did verbalize he is depressed and still has some thoughts of SI but not a plan.  Stated he is hoping to get into a long term care facility for his depression.  Wanted medication so he could go to sleep.  Will continue to maintain safety checks every Q15 minutes.

## 2020-12-22 NOTE — Progress Notes (Signed)
Patient is A & O x4.  He is compliant with medication.  He was talkative during medication.  He did remain in his room.  Will maintain safety checks every Q15 minutes.

## 2020-12-22 NOTE — Plan of Care (Signed)
Pt rates depression 6/10, hopelessness 8/10 and anxiety 8/10. Pt denies SI, HI and AVH. Pt was educated on care plan and verbalizes understanding. Lee Mayers RN Problem: Education: Goal: Knowledge of [facility] Education information/materials will improve Outcome: Progressing Goal: Emotional status will improve Outcome: Progressing Goal: Mental status will improve Outcome: Progressing Goal: Verbalization of understanding the information provided will improve Outcome: Progressing   Problem: Safety: Goal: Periods of time without injury will increase Outcome: Progressing   Problem: Education: Goal: Ability to make informed decisions regarding treatment will improve Outcome: Progressing   Problem: Medication: Goal: Compliance with prescribed medication regimen will improve Outcome: Progressing

## 2020-12-23 DIAGNOSIS — F332 Major depressive disorder, recurrent severe without psychotic features: Secondary | ICD-10-CM | POA: Diagnosis not present

## 2020-12-23 NOTE — Progress Notes (Addendum)
Pt rates both depression and anxiety both 8/10. Pt denies HI and AVH but has passive SI, no plan and contracts for safety.Pt has been somewhat social but resting a lot. He is in the courtyard.  Torrie Mayers RN

## 2020-12-23 NOTE — Progress Notes (Signed)
Patient presents depressed and endorses passive SI. Pt verbally contracts for safety. Patient observed interacting appropriately with staff and peers on the unit. Patient given education, support, and encouragement to be active in his treatment plan. Patient compliant with medication administration per MD orders. Pt being monitored Q 15 minutes for safety per unit protocol. Pt remains safe on the unit.

## 2020-12-23 NOTE — [facility] Group Notes (Signed)
LCSW Group Therapy Note  12/23/2020 2:02 PM  Type of Therapy and Topic:  Group Therapy:  Feelings around Relapse and Recovery  Participation Level:  Did Not Attend   Description of Group:    Patients in this group will discuss emotions they experience before and after a relapse. They will process how experiencing these feelings, or avoidance of experiencing them, relates to having a relapse. Facilitator will guide patients to explore emotions they have related to recovery. Patients will be encouraged to process which emotions are more powerful. They will be guided to discuss the emotional reaction significant others in their lives may have to their relapse or recovery. Patients will be assisted in exploring ways to respond to the emotions of others without this contributing to a relapse.  Therapeutic Goals: 1. Patient will identify two or more emotions that lead to a relapse for them 2. Patient will identify two emotions that result when they relapse 3. Patient will identify two emotions related to recovery 4. Patient will demonstrate ability to communicate their needs through discussion and/or role plays   Summary of Patient Progress:Patient did not attend group despite encouraged participation.     Therapeutic Modalities:   Cognitive Behavioral Therapy Solution-Focused Therapy Assertiveness Training Relapse Prevention Therapy   Gwenevere Ghazi, MSW, [place], [place] 12/23/2020 2:02 PM

## 2020-12-23 NOTE — Plan of Care (Signed)
Patient stated he is feeling better but still feeling depressed and anxious   Problem: Education: Goal: Emotional status will improve Outcome: Not Progressing Goal: Mental status will improve Outcome: Not Progressing

## 2020-12-23 NOTE — Plan of Care (Signed)
Pt rates depression 8/10, hopelessness and anxiety 7/10. Pt denies HI and AVH. Pt has passive SI but contracts verbally for safety. Torrie Mayers RN.  Problem: Education: Goal: Knowledge of [facility] Education information/materials will improve Outcome: Progressing Goal: Emotional status will improve Outcome: Progressing Goal: Mental status will improve Outcome: Progressing Goal: Verbalization of understanding the information provided will improve Outcome: Progressing   Problem: Safety: Goal: Periods of time without injury will increase Outcome: Progressing   Problem: Education: Goal: Ability to make informed decisions regarding treatment will improve Outcome: Progressing   Problem: Medication: Goal: Compliance with prescribed medication regimen will improve Outcome: Progressing

## 2020-12-23 NOTE — Progress Notes (Signed)
[facility] MD Progress Note  12/23/2020 1:31 PM KAIREE ISA  MRN:  [id number]   Principal Problem: Severe recurrent major depression without psychotic features (HCC) Diagnosis: Principal Problem:   Severe recurrent major depression without psychotic features (HCC) Active Problems:   Hypertension   Cocaine abuse (HCC)   Severe alcohol use disorder (HCC)  50 year old male presenting to the hospital due to worsening depression, relapse on alcohol, and suicidal ideations.  No acute events overnight, medication compliant, attending to ADLs.   Subjective:   Patient continues to report depressed mood, but says he is feeling better physically with mild remaining "shakes". He continues to report passive suicidal thoughts, but reports feeling safe from harming self here in the unit.  He denies homicidal ideations, visual hallucinations, auditory hallucinations. He is still interested in residential treatment of substance use disorder.   Total Time spent with patient: 15 minutes  Past Psychiatric History: see H&P  Past Medical History:  Past Medical History:  Diagnosis Date  . Alcohol-induced pancreatitis   . Anemia   . Bronchitis     Past Surgical History:  Procedure Laterality Date  . CYSTECTOMY    . ESOPHAGOGASTRODUODENOSCOPY  07/28/2011   Procedure: ESOPHAGOGASTRODUODENOSCOPY (EGD);  Surgeon: Freddy Jaksch, MD;  Location: [facility] ENDOSCOPY;  Service: Endoscopy;  Laterality: N/A;  . HERNIA REPAIR     Family History:  Family History  Problem Relation Age of Onset  . Hypertension Father   . Aneurysm Father   . Cancer Other   . Hypertension Mother   . Pneumonia Mother    Family Psychiatric  History: see H&P Social History:  Social History   Substance and Sexual Activity  Alcohol Use Yes  . Alcohol/week: 50.0 standard drinks  . Types: 50 Cans of beer per week   Comment: Equivalent of two 40 oz beers daily.     Social History   Substance and Sexual Activity  Drug Use No     Social History   Socioeconomic History  . Marital status: Single    Spouse name: Not on file  . Number of children: Not on file  . Years of education: Not on file  . Highest education level: Not on file  Occupational History  . Not on file  Tobacco Use  . Smoking status: Current Every Day Smoker    Packs/day: 0.50    Years: 15.00    Pack years: 7.50    Types: Cigarettes    Last attempt to quit: 06/27/2011    Years since quitting: 9.4  . Smokeless tobacco: Never Used  Substance and Sexual Activity  . Alcohol use: Yes    Alcohol/week: 50.0 standard drinks    Types: 50 Cans of beer per week    Comment: Equivalent of two 40 oz beers daily.  . Drug use: No  . Sexual activity: Yes  Other Topics Concern  . Not on file  Social History Narrative  . Not on file   Social Determinants of Health   Financial Resource Strain: Not on file  Food Insecurity: Not on file  Transportation Needs: Not on file  Physical Activity: Not on file  Stress: Not on file  Social Connections: Not on file   Additional Social History:                         Sleep: Good  Appetite:  Good  Current Medications: Current Facility-Administered Medications  Medication Dose Route Frequency Provider Last Rate Last Admin  .  acetaminophen (TYLENOL) tablet 650 mg  650 mg Oral Q6H PRN [name], [name], MD      . albuterol (PROVENTIL) (2.5 MG/3ML) 0.083% nebulizer solution 3 mL  3 mL Inhalation Q4H PRN [name], [name], MD      . alum & mag hydroxide-simeth (MAALOX/MYLANTA) 200-200-20 MG/5ML suspension 30 mL  30 mL Oral Q4H PRN [name], [name], MD      . amLODipine (NORVASC) tablet 5 mg  5 mg Oral Daily [name], MD   5 mg at 12/23/20 0810  . buPROPion (WELLBUTRIN XL) 24 hr tablet 150 mg  150 mg Oral Daily [name], MD   150 mg at 12/23/20 0810  . folic acid (FOLVITE) tablet 1 mg  1 mg Oral Daily [name], MD   1 mg at 12/23/20 0810  . hydrOXYzine (ATARAX/VISTARIL) tablet 50  mg  50 mg Oral QHS [name], MD   50 mg at 12/22/20 2018  . loratadine (CLARITIN) tablet 10 mg  10 mg Oral Daily [name], [name], MD   10 mg at 12/23/20 0810  . [START ON 12/24/2020] LORazepam (ATIVAN) tablet 1 mg  1 mg Oral Daily [name], NP      . magnesium hydroxide (MILK OF MAGNESIA) suspension 30 mL  30 mL Oral Daily PRN [name], [name], MD      . mirtazapine (REMERON) tablet 45 mg  45 mg Oral QHS [name], MD   45 mg at 12/22/20 2019  . multivitamin with minerals tablet 1 tablet  1 tablet Oral Daily [name], MD   1 tablet at 12/23/20 0809  . naltrexone (DEPADE) tablet 50 mg  50 mg Oral Daily [name], MD   50 mg at 12/23/20 0809  . nicotine polacrilex (NICORETTE) gum 2 mg  2 mg Oral Q4H PRN [name], MD   2 mg at 12/23/20 0810  . thiamine (B-1) injection 100 mg  100 mg Intramuscular Once [name], NP      . thiamine tablet 100 mg  100 mg Oral Daily [name], NP   100 mg at 12/23/20 7510    Lab Results: No results found for this or any previous visit (from the past 48 hour(s)).  Blood Alcohol level:  Lab Results  Component Value Date   ETH <10 12/19/2020   ETH 341 (HH) 10/30/2020    Metabolic Disorder Labs: Lab Results  Component Value Date   HGBA1C 5.7 (H) 05/30/2020   No results found for: PROLACTIN Lab Results  Component Value Date   CHOL 206 (H) 05/30/2020   TRIG 187 (H) 05/30/2020   HDL 28 (L) 05/30/2020   CHOLHDL 7.4 (H) 05/30/2020   LDLCALC 144 (H) 05/30/2020   LDLCALC 98 06/28/2013    Physical Findings: AIMS:  , ,  ,  ,    CIWA:  CIWA-Ar Total: 0 COWS:     Musculoskeletal: Strength & Muscle Tone: within normal limits Gait & Station: normal Patient leans: N/A  Psychiatric Specialty Exam:  Presentation  General Appearance: Casual  Eye Contact:Fair  Speech:Clear and Coherent  Speech Volume:Normal  Handedness:Right   Mood and Affect   Mood:Depressed  Affect:Congruent   Thought Process  Thought Processes:Goal Directed  Descriptions of Associations:Intact  Orientation:Full (Time, Place and Person)  Thought Content:Logical  History of Schizophrenia/Schizoaffective disorder:No  Duration of Psychotic Symptoms:No data recorded Hallucinations:No data recorded  Ideas of Reference:None  Suicidal Thoughts:No data recorded  Homicidal Thoughts:No data  recorded   Sensorium  Memory:Immediate Good; Recent Good; Remote Good  Judgment:Impaired  Insight:Shallow   Executive Functions  Concentration:Fair  Attention Span:Fair  Recall:Fair  Fund of Knowledge:Fair  Language:Fair   Psychomotor Activity  Psychomotor Activity:No data recorded   Assets  Assets:Communication Skills; Desire for Improvement; Resilience   Sleep  Sleep:No data recorded    Physical Exam: Physical Exam  ROS  Blood pressure (!) 138/97, pulse (!) 110, temperature 98.5 F (36.9 C), temperature source Oral, resp. rate 18, height 5\' 9"  (1.753 m), weight 83.9 kg, SpO2 98 %. Body mass index is 27.31 kg/m.   Treatment Plan Summary: Daily contact with patient to assess and evaluate symptoms and progress in treatment and Medication management  Patient is a 50 year old male with the above-stated past psychiatric history who is seen in follow-up.  Chart reviewed. Patient discussed with nursing. Reports feeling better physically. Last CIWA was 0. Mentally - still depressed, passive death wishes. No medication changes today (I would not like to increase the dose of Wellbutrin yet due to short period from the last drink still); will reassess tomorrow.     Plan:  -continue inpatient psych admission; 15-minute checks; daily contact with patient to assess and evaluate symptoms and progress in treatment; psychoeducation.  -continue scheduled medications: . amLODipine  5 mg Oral Daily  . buPROPion  150 mg Oral Daily  . folic acid  1  mg Oral Daily  . hydrOXYzine  50 mg Oral QHS  . loratadine  10 mg Oral Daily  . [START ON 12/24/2020] LORazepam  1 mg Oral Daily  . mirtazapine  45 mg Oral QHS  . multivitamin with minerals  1 tablet Oral Daily  . naltrexone  50 mg Oral Daily  . thiamine  100 mg Intramuscular Once  . thiamine  100 mg Oral Daily    -continue PRN medications. acetaminophen, albuterol, alum & mag hydroxide-simeth, magnesium hydroxide, nicotine polacrilex  -Disposition: patient is interested in residential SUD treatment.  -  I certify that the patient does need, on a daily basis, active treatment furnished directly by or requiring the supervision of inpatient psychiatric facility personnel.   12/26/2020, MD 12/23/2020, 1:31 PM

## 2020-12-24 DIAGNOSIS — F332 Major depressive disorder, recurrent severe without psychotic features: Secondary | ICD-10-CM | POA: Diagnosis not present

## 2020-12-24 NOTE — Progress Notes (Signed)
[facility] MD Progress Note  12/24/2020 2:01 PM Lee Sparks  MRN:  [id number]   Principal Problem: Severe recurrent major depression without psychotic features [facility]) Diagnosis: Principal Problem:   Severe recurrent major depression without psychotic features (HCC) Active Problems:   Hypertension   Cocaine abuse (HCC)   Severe alcohol use disorder (HCC)  50 year old male presenting to the hospital due to worsening depression, relapse on alcohol, and suicidal ideations.  No acute events overnight, medication compliant, attending to ADLs.   Subjective: Patient states that he is feeling a little bit better today.  He continues to have severe depression and anxiety, but notes that his suicidal ideations are becoming far less frequent.  He is also more future oriented and looking into different residential substance abuse treatment programs.  He expresses interest in the [facility], and CSW sent application this weekend. He has been medication compliant, and denies any side effects.  He does feel like these help his mood overall, and notes that he needs to stay on them this time.  He does express concern over ability to afford medications at discharge.  Time spent educating on medication management program.  He denies any homicidal ideations, visual hallucinations, auditory hallucinations.   Total Time spent with patient: 15 minutes  Past Psychiatric History: see H&P  Past Medical History:  Past Medical History:  Diagnosis Date  . Alcohol-induced pancreatitis   . Anemia   . Bronchitis     Past Surgical History:  Procedure Laterality Date  . CYSTECTOMY    . ESOPHAGOGASTRODUODENOSCOPY  07/28/2011   Procedure: ESOPHAGOGASTRODUODENOSCOPY (EGD);  Surgeon: Freddy Jaksch, MD;  Location: [facility] ENDOSCOPY;  Service: Endoscopy;  Laterality: N/A;  . HERNIA REPAIR     Family History:  Family History  Problem Relation Age of Onset  . Hypertension Father   . Aneurysm Father   . Cancer Other   .  Hypertension Mother   . Pneumonia Mother    Family Psychiatric  History: see H&P Social History:  Social History   Substance and Sexual Activity  Alcohol Use Yes  . Alcohol/week: 50.0 standard drinks  . Types: 50 Cans of beer per week   Comment: Equivalent of two 40 oz beers daily.     Social History   Substance and Sexual Activity  Drug Use No    Social History   Socioeconomic History  . Marital status: Single    Spouse name: Not on file  . Number of children: Not on file  . Years of education: Not on file  . Highest education level: Not on file  Occupational History  . Not on file  Tobacco Use  . Smoking status: Current Every Day Smoker    Packs/day: 0.50    Years: 15.00    Pack years: 7.50    Types: Cigarettes    Last attempt to quit: 06/27/2011    Years since quitting: 9.5  . Smokeless tobacco: Never Used  Substance and Sexual Activity  . Alcohol use: Yes    Alcohol/week: 50.0 standard drinks    Types: 50 Cans of beer per week    Comment: Equivalent of two 40 oz beers daily.  . Drug use: No  . Sexual activity: Yes  Other Topics Concern  . Not on file  Social History Narrative  . Not on file   Social Determinants of Health   Financial Resource Strain: Not on file  Food Insecurity: Not on file  Transportation Needs: Not on file  Physical Activity:  Not on file  Stress: Not on file  Social Connections: Not on file   Additional Social History:       Sleep: Good  Appetite:  Good  Current Medications: Current Facility-Administered Medications  Medication Dose Route Frequency Provider Last Rate Last Admin  . acetaminophen (TYLENOL) tablet 650 mg  650 mg Oral Q6H PRN [name], [name], MD      . albuterol (PROVENTIL) (2.5 MG/3ML) 0.083% nebulizer solution 3 mL  3 mL Inhalation Q4H PRN [name], [name], MD      . alum & mag hydroxide-simeth (MAALOX/MYLANTA) 200-200-20 MG/5ML suspension 30 mL  30 mL Oral Q4H PRN [name], [name], MD      . amLODipine  (NORVASC) tablet 5 mg  5 mg Oral Daily [name], MD   5 mg at 12/24/20 0809  . buPROPion (WELLBUTRIN XL) 24 hr tablet 150 mg  150 mg Oral Daily [name], MD   150 mg at 12/24/20 0809  . folic acid (FOLVITE) tablet 1 mg  1 mg Oral Daily [name], MD   1 mg at 12/24/20 0809  . hydrOXYzine (ATARAX/VISTARIL) tablet 50 mg  50 mg Oral QHS [name], MD   50 mg at 12/23/20 2109  . loratadine (CLARITIN) tablet 10 mg  10 mg Oral Daily [name], [name], MD   10 mg at 12/24/20 8185  . magnesium hydroxide (MILK OF MAGNESIA) suspension 30 mL  30 mL Oral Daily PRN [name], [name], MD      . mirtazapine (REMERON) tablet 45 mg  45 mg Oral QHS [name], MD   45 mg at 12/23/20 2109  . multivitamin with minerals tablet 1 tablet  1 tablet Oral Daily [name], MD   1 tablet at 12/24/20 0809  . naltrexone (DEPADE) tablet 50 mg  50 mg Oral Daily [name], MD   50 mg at 12/24/20 6314  . nicotine polacrilex (NICORETTE) gum 2 mg  2 mg Oral Q4H PRN [name], MD   2 mg at 12/24/20 1226  . thiamine (B-1) injection 100 mg  100 mg Intramuscular Once [name], NP      . thiamine tablet 100 mg  100 mg Oral Daily [name], NP   100 mg at 12/24/20 9702    Lab Results: No results found for this or any previous visit (from the past 48 hour(s)).  Blood Alcohol level:  Lab Results  Component Value Date   ETH <10 12/19/2020   ETH 341 (HH) 10/30/2020    Metabolic Disorder Labs: Lab Results  Component Value Date   HGBA1C 5.7 (H) 05/30/2020   No results found for: PROLACTIN Lab Results  Component Value Date   CHOL 206 (H) 05/30/2020   TRIG 187 (H) 05/30/2020   HDL 28 (L) 05/30/2020   CHOLHDL 7.4 (H) 05/30/2020   LDLCALC 144 (H) 05/30/2020   LDLCALC 98 06/28/2013    Physical Findings: AIMS:  , ,  ,  ,    CIWA:  CIWA-Ar Total: 0 COWS:     Musculoskeletal: Strength & Muscle Tone: within normal limits Gait & Station: normal Patient  leans: N/A  Psychiatric Specialty Exam:  Presentation  General Appearance: Casual  Eye Contact:Fair  Speech:Clear and Coherent  Speech Volume:Normal  Handedness:Right   Mood and Affect  Mood:Depressed  Affect:Congruent   Thought Process  Thought Processes:Goal Directed  Descriptions of Associations:Intact  Orientation:Full (Time, Place and Person)  Thought Content:Logical  History of Schizophrenia/Schizoaffective disorder:No  Duration of Psychotic Symptoms:N/A Hallucinations:None Ideas of Reference:None  Suicidal Thoughts:Passive suicidal ideations that are decreasing in frequency Homicidal Thoughts:Denies  Sensorium  Memory:Immediate Good; Recent Good; Remote Good  Judgment:Impaired  Insight:Shallow   Executive Functions  Concentration:Fair  Attention Span:Fair  Recall:Fair  Fund of Knowledge:Fair  Language:Fair   Psychomotor Activity  Psychomotor Activity:Normal  Assets  Assets:Communication Skills; Desire for Improvement; Resilience   Sleep  Sleep:Fair 7.75   Physical Exam: Physical Exam  ROS  Blood pressure (!) 137/92, pulse 64, temperature 97.7 F (36.5 C), temperature source Oral, resp. rate 17, height 5\' 9"  (1.753 m), weight 83.9 kg, SpO2 100 %. Body mass index is 27.31 kg/m.   Treatment Plan Summary: Daily contact with patient to assess and evaluate symptoms and progress in treatment and Medication management  Patient is a 50 year old male with the above-stated past psychiatric history who is seen in follow-up.  Chart reviewed. Patient discussed with nursing. Reports feeling better physically. Last CIWA was 0. Mentally - still depressed, passive death wishes. No medication changes today.    Plan:  -continue inpatient psych admission; 15-minute checks; daily contact with patient to assess and evaluate symptoms and progress in treatment; psychoeducation.  -continue scheduled medications: . amLODipine  5 mg Oral Daily   . buPROPion  150 mg Oral Daily  . folic acid  1 mg Oral Daily  . hydrOXYzine  50 mg Oral QHS  . loratadine  10 mg Oral Daily  . mirtazapine  45 mg Oral QHS  . multivitamin with minerals  1 tablet Oral Daily  . naltrexone  50 mg Oral Daily  . thiamine  100 mg Intramuscular Once  . thiamine  100 mg Oral Daily    -continue PRN medications. acetaminophen, albuterol, alum & mag hydroxide-simeth, magnesium hydroxide, nicotine polacrilex  -Disposition: patient is interested in residential SUD treatment.  -  I certify that the patient does need, on a daily basis, active treatment furnished directly by or requiring the supervision of inpatient psychiatric facility personnel.   53, MD 12/24/2020, 2:01 PM

## 2020-12-24 NOTE — Progress Notes (Signed)
Patient presents depressed and endorses passive SI. Pt verbally contracts for safety. Patient observed interacting appropriately with staff and peers on the unit. Patient given education, support, and encouragement to be active in his treatment plan. Patient compliant with medication administration per MD orders. Pt being monitored Q 15 minutes for safety per unit protocol. Pt remains safe on the unit.  

## 2020-12-24 NOTE — Progress Notes (Signed)
Patient is calm and cooperative with assessment. He states he is feeling a bit better, but reports poor sleep because of noise from another patient in the middle of the night. He denies SI, HI, and AVH, but mentions that SI thoughts will come and go. He rates depression as an 8/10 and anxiety as a 7/10. He also reports mild withdrawal symptoms, including a mild headache and shakes. Patient was educated on his medications and remains medication compliant. Patient is active on the unit and is observed to interact appropriately with staff and other patients on the unit. Patient remains safe on the unit at this time and q15 min safety checks have been maintained.

## 2020-12-24 NOTE — Plan of Care (Signed)
Patient stated he is feeling better tonight  Problem: Education: Goal: Emotional status will improve Outcome: Progressing Goal: Mental status will improve Outcome: Progressing

## 2020-12-24 NOTE — Progress Notes (Signed)
Recreation Therapy Notes  Date: 12/24/2020  Time: 9:30 am   Location: Court yard   Behavioral response: N/A   Intervention Topic: Social skills    Discussion/Intervention: Patient did not attend group.   Clinical Observations/Feedback:  Patient did not attend group.   Adon Gehlhausen LRT/CTRS        Sritha Chauncey 12/24/2020 11:59 AM

## 2020-12-24 NOTE — [facility] Counselor (Signed)
CSW received a call from [facility].  Pt is not eligible to return to [facility] at this time.  He left in December 2021 and they require a year between admits.   Penni Homans, MSW, LCSW 12/24/2020 2:05 PM

## 2020-12-25 DIAGNOSIS — F332 Major depressive disorder, recurrent severe without psychotic features: Secondary | ICD-10-CM | POA: Diagnosis not present

## 2020-12-25 MED ORDER — COVID-19 MRNA VAC-TRIS(PFIZER) 30 MCG/0.3ML IM SUSP
0.3000 mL | Freq: Once | INTRAMUSCULAR | Status: AC
  Administered 2020-12-25: 0.3 mL via INTRAMUSCULAR
  Filled 2020-12-25: qty 0.3

## 2020-12-25 NOTE — [facility] Counselor (Signed)
CSW called [facility] in an effort to obtain additional information on the referral process.  CSW was asked to call back 12/26/2020 after 9AM and ask to speak to Mrs. Scott.  [phone]  Penni Homans, MSW, LCSW 12/25/2020 1:37 PM

## 2020-12-25 NOTE — [facility] Counselor (Signed)
CSW met with pt briefly to talk about his screening with [facility]. He stated that he got accepted but that they wanted CSW to call them back. CSW agreed. Pt stated that he has a dilemma as his clothes are at the place that he was staying. CSW explained that unfortunately transportation will only make one stop and that is at the place of disposition. Pt stated that it is really important. CSW stated that he would ask to verify if there was anything that could be done but did not make any promises. Pt agreed. No other concerns expressed. Contact ended without incident.   CSW attempted to contact Katie at [facility]. Contact unable to be established and voicemail unable to be left. CSW will attempt contact tomorrow.   CSW followed up regarding transportation and was informed that they only make one stop.   Chalmers Guest. Guerry Bruin, MSW, [place], [place] 12/25/2020 4:10 PM

## 2020-12-25 NOTE — [facility] Group Notes (Signed)
       [facility] Group Notes:  (Nursing/MHT/Case Management/Adjunct)  Date:  12/25/2020  Time:  11:44 PM  Type of Therapy:  Group Therapy  Participation Level:  Active  Participation Quality:  Appropriate  Affect:  Appropriate  Cognitive:  Alert  Insight:  Good  Engagement in Group:  Engaged and goal was to go to long terms  treatments.  Modes of Intervention:  Support  Summary of Progress/Problems:  Mayra Neer 12/25/2020, 11:44 PM

## 2020-12-25 NOTE — Plan of Care (Signed)
Patient stated he is ready to D/C tomorrow and go to inpatient treatment   Problem: Education: Goal: Emotional status will improve Outcome: Progressing Goal: Mental status will improve Outcome: Progressing

## 2020-12-25 NOTE — Progress Notes (Signed)
Patient is calm and cooperative with assessment. Patient says he is still feeling depressed and rates it as a 7/10. He denies SI at the time of assessment, but says it comes and goes. However, he states it has been less frequent than in the past. He denies HI and AVH. Patient is observed to be interacting appropriately with staff and other patients. Patient was educated on medications and remains medication compliant. Support and encouragement provided. Patient remains safe on the unit at this time and q15 min safety checks have been maintained.

## 2020-12-25 NOTE — Progress Notes (Addendum)
Recreation Therapy Notes  Date: 12/25/2020  Time: 10:00 am   Location: Craft room   Behavioral response: Appropriate  Intervention Topic: Anger Management    Discussion/Intervention:  Group content on today was focused on anger management. The group defined anger and reasons they become angry. Individuals expressed negative way they have dealt with anger in the past. Patients stated some positive ways they could deal with anger in the future. The group described how anger can affect your health and daily plans. Individuals participated in the intervention "Score your anger" where they had a chance to answer questions about themselves and get a score of their anger.  Clinical Observations/Feedback: Patient came to group and was focused on what peers and staff had to say about anger management. Individual was social with staff and peers while participating in the intervention. Delaina Fetsch LRT/CTRS         Orilla Templeman 12/25/2020 11:55 AM

## 2020-12-25 NOTE — Progress Notes (Signed)
Patient presents depressed and endorses passive SI. Pt verbally contracts for safety. Patient observed interacting appropriately with staff and peers on the unit. Patient given education, support, and encouragement to be active in his treatment plan. Patient compliant with medication administration per MD orders. Pt being monitored Q 15 minutes for safety per unit protocol. Pt remains safe on the unit.  

## 2020-12-25 NOTE — Progress Notes (Signed)
[facility] MD Progress Note  12/25/2020 12:21 PM Lee Sparks  MRN:  [id number]   Principal Problem: Severe recurrent major depression without psychotic features [facility]) Diagnosis: Principal Problem:   Severe recurrent major depression without psychotic features (HCC) Active Problems:   Hypertension   Cocaine abuse (HCC)   Severe alcohol use disorder (HCC)  Follow-up for 50 year old male who presented to the hospital due to worsening depression, relapse on alcohol, and suicidal ideations.  No acute events overnight, medication compliant, attending to ADLs.   Subjective: Patient states he feels he is improving each day.  He continues to have some passive suicidal ideations but they are decreasing in frequency and intensity.  He denies any auditory or visual hallucinations.  He also denies homicidal ideations.  No side effects from medications, or any physical complaints today.  He is disappointed that he has been declining from our [facility].  He remains hopeful that he will be accepted into the [facility].  He also inquires about center called [facility] in [place].  CSW team alerted to new interest.  He remains hopeful to find residential substance use treatment center.  He has previously failed outpatient treatment.  Total Time spent with patient: 15 minutes  Past Psychiatric History: see H&P  Past Medical History:  Past Medical History:  Diagnosis Date  . Alcohol-induced pancreatitis   . Anemia   . Bronchitis     Past Surgical History:  Procedure Laterality Date  . CYSTECTOMY    . ESOPHAGOGASTRODUODENOSCOPY  07/28/2011   Procedure: ESOPHAGOGASTRODUODENOSCOPY (EGD);  Surgeon: Freddy Jaksch, MD;  Location: [facility] ENDOSCOPY;  Service: Endoscopy;  Laterality: N/A;  . HERNIA REPAIR     Family History:  Family History  Problem Relation Age of Onset  . Hypertension Father   . Aneurysm Father   . Cancer Other   . Hypertension Mother   . Pneumonia Mother    Family  Psychiatric  History: see H&P Social History:  Social History   Substance and Sexual Activity  Alcohol Use Yes  . Alcohol/week: 50.0 standard drinks  . Types: 50 Cans of beer per week   Comment: Equivalent of two 40 oz beers daily.     Social History   Substance and Sexual Activity  Drug Use No    Social History   Socioeconomic History  . Marital status: Single    Spouse name: Not on file  . Number of children: Not on file  . Years of education: Not on file  . Highest education level: Not on file  Occupational History  . Not on file  Tobacco Use  . Smoking status: Current Every Day Smoker    Packs/day: 0.50    Years: 15.00    Pack years: 7.50    Types: Cigarettes    Last attempt to quit: 06/27/2011    Years since quitting: 9.5  . Smokeless tobacco: Never Used  Substance and Sexual Activity  . Alcohol use: Yes    Alcohol/week: 50.0 standard drinks    Types: 50 Cans of beer per week    Comment: Equivalent of two 40 oz beers daily.  . Drug use: No  . Sexual activity: Yes  Other Topics Concern  . Not on file  Social History Narrative  . Not on file   Social Determinants of Health   Financial Resource Strain: Not on file  Food Insecurity: Not on file  Transportation Needs: Not on file  Physical Activity: Not on file  Stress:  Not on file  Social Connections: Not on file   Additional Social History:       Sleep: Good  Appetite:  Good  Current Medications: Current Facility-Administered Medications  Medication Dose Route Frequency Provider Last Rate Last Admin  . acetaminophen (TYLENOL) tablet 650 mg  650 mg Oral Q6H PRN [name], [name], MD      . albuterol (PROVENTIL) (2.5 MG/3ML) 0.083% nebulizer solution 3 mL  3 mL Inhalation Q4H PRN [name], [name], MD      . alum & mag hydroxide-simeth (MAALOX/MYLANTA) 200-200-20 MG/5ML suspension 30 mL  30 mL Oral Q4H PRN [name], [name], MD      . amLODipine (NORVASC) tablet 5 mg  5 mg Oral Daily [name], MD    5 mg at 12/25/20 0717  . buPROPion (WELLBUTRIN XL) 24 hr tablet 150 mg  150 mg Oral Daily [name], MD   150 mg at 12/25/20 0717  . folic acid (FOLVITE) tablet 1 mg  1 mg Oral Daily [name], MD   1 mg at 12/25/20 6834  . hydrOXYzine (ATARAX/VISTARIL) tablet 50 mg  50 mg Oral QHS [name], MD   50 mg at 12/24/20 2115  . loratadine (CLARITIN) tablet 10 mg  10 mg Oral Daily [name], [name], MD   10 mg at 12/25/20 0717  . magnesium hydroxide (MILK OF MAGNESIA) suspension 30 mL  30 mL Oral Daily PRN [name], [name], MD      . mirtazapine (REMERON) tablet 45 mg  45 mg Oral QHS [name], MD   45 mg at 12/24/20 2115  . multivitamin with minerals tablet 1 tablet  1 tablet Oral Daily [name], MD   1 tablet at 12/25/20 0717  . naltrexone (DEPADE) tablet 50 mg  50 mg Oral Daily [name], MD   50 mg at 12/25/20 1962  . nicotine polacrilex (NICORETTE) gum 2 mg  2 mg Oral Q4H PRN [name], MD   2 mg at 12/25/20 2297  . thiamine (B-1) injection 100 mg  100 mg Intramuscular Once [name], NP      . thiamine tablet 100 mg  100 mg Oral Daily [name], NP   100 mg at 12/25/20 9892    Lab Results: No results found for this or any previous visit (from the past 48 hour(s)).  Blood Alcohol level:  Lab Results  Component Value Date   ETH <10 12/19/2020   ETH 341 (HH) 10/30/2020    Metabolic Disorder Labs: Lab Results  Component Value Date   HGBA1C 5.7 (H) 05/30/2020   No results found for: PROLACTIN Lab Results  Component Value Date   CHOL 206 (H) 05/30/2020   TRIG 187 (H) 05/30/2020   HDL 28 (L) 05/30/2020   CHOLHDL 7.4 (H) 05/30/2020   LDLCALC 144 (H) 05/30/2020   LDLCALC 98 06/28/2013    Physical Findings: AIMS:  , ,  ,  ,    CIWA:  CIWA-Ar Total: 0 COWS:     Musculoskeletal: Strength & Muscle Tone: within normal limits Gait & Station: normal Patient leans: N/A  Psychiatric Specialty Exam:  Presentation   General Appearance: Casual  Eye Contact:Fair  Speech:Clear and Coherent  Speech Volume:Normal  Handedness:Right   Mood and Affect  Mood:Depressed  Affect:Congruent   Thought Process  Thought Processes:Goal Directed  Descriptions of Associations:Intact  Orientation:Full (Time, Place and Person)  Thought Content:Logical  History of Schizophrenia/Schizoaffective disorder:No  Duration of Psychotic Symptoms:N/A Hallucinations:None Ideas of Reference:None  Suicidal Thoughts:Passive suicidal ideations that are decreasing in frequency and intensity Homicidal Thoughts:Denies  Sensorium  Memory:Immediate Good; Recent Good; Remote Good  Judgment:Impaired  Insight:Shallow   Executive Functions  Concentration:Fair  Attention Span:Fair  Recall:Fair  Fund of Knowledge:Fair  Language:Fair   Psychomotor Activity  Psychomotor Activity:Normal  Assets  Assets:Communication Skills; Desire for Improvement; Resilience   Sleep  Sleep:Fair 8   Physical Exam: Physical Exam  ROS  Blood pressure 133/87, pulse 89, temperature 97.9 F (36.6 C), temperature source Oral, resp. rate 18, height 5\' 9"  (1.753 m), weight 83.9 kg, SpO2 98 %. Body mass index is 27.31 kg/m.   Treatment Plan Summary: Daily contact with patient to assess and evaluate symptoms and progress in treatment and Medication management  Patient is a 50 year old male with the above-stated past psychiatric history who is seen in follow-up.  Chart reviewed. Patient discussed with nursing. Reports feeling better physically. Last CIWA was 0. Mentally - still depressed, intermittent passive death wishes that are decreasing in frequency and intensity.  No medication changes today.    Plan:  -continue inpatient psych admission; 15-minute checks; daily contact with patient to assess and evaluate symptoms and progress in treatment; psychoeducation.  -continue scheduled medications: . amLODipine  5 mg Oral  Daily  . buPROPion  150 mg Oral Daily  . folic acid  1 mg Oral Daily  . hydrOXYzine  50 mg Oral QHS  . loratadine  10 mg Oral Daily  . mirtazapine  45 mg Oral QHS  . multivitamin with minerals  1 tablet Oral Daily  . naltrexone  50 mg Oral Daily  . thiamine  100 mg Intramuscular Once  . thiamine  100 mg Oral Daily    -continue PRN medications. acetaminophen, albuterol, alum & mag hydroxide-simeth, magnesium hydroxide, nicotine polacrilex  -Disposition: patient is interested in residential SUD treatment.  -  I certify that the patient does need, on a daily basis, active treatment furnished directly by or requiring the supervision of inpatient psychiatric facility personnel.  12/25/20:. Psychiatric exam above reviewed and remains accurate. Assessment and plan above reviewed and updated.     12/27/20, MD 12/25/2020, 12:21 PM

## 2020-12-25 NOTE — [facility] Counselor (Signed)
CSW contacted [facility] to follow up regarding application. Florentina Addison stated that she would review the application and give the CSW a call back. No other concerns expressed. Contact ended without incident.   Vilma Meckel. [name], MSW, LCSW, LCAS 12/25/2020 11:17 AM

## 2020-12-25 NOTE — [facility] Counselor (Signed)
CSW received call from [facility] to complete an interview with the patient.  CSW provided the nurses station number and informed nurses of the expected call.  Nurse Marchelle Folks approved pt to complete the interview.   Penni Homans, MSW, LCSW 12/25/2020 12:58 PM

## 2020-12-25 NOTE — [facility] Group Notes (Signed)
LCSW Group Therapy Note     12/25/2020 2:51 PM     Type of Therapy and Topic:  Group Therapy:  Overcoming Obstacles     Participation Level:  Active     Description of Group:     In this group patients will be encouraged to explore what they see as obstacles to their own wellness and recovery. They will be guided to discuss their thoughts, feelings, and behaviors related to these obstacles. The group will process together ways to cope with barriers, with attention given to specific choices patients can make. Each patient will be challenged to identify changes they are motivated to make in order to overcome their obstacles. This group will be process-oriented, with patients participating in exploration of their own experiences as well as giving and receiving support and challenge from other group members.     Therapeutic Goals:  1.    Patient will identify personal and current obstacles as they relate to admission.  2.    Patient will identify barriers that currently interfere with their wellness or overcoming obstacles.  3.    Patient will identify feelings, thought process and behaviors related to these barriers.  4.    Patient will identify two changes they are willing to make to overcome these obstacles:        Summary of Patient Progress:   Patient was present for the entirety of the group session. Patient was an active listener and participated in the topic of discussion, provided helpful advice to others, and added nuance to topic of conversation. Patient stated that he wants to get back in a relationship with  "God" and "Jesus" as a change he wants to make. He also stated that he wants to "work on his depression." Those two circumstances he stated are two changes he wants to make.    Therapeutic Modalities:    Cognitive Behavioral Therapy  Solution Focused Therapy  Motivational Interviewing  Relapse Prevention Therapy     Charleston Hankin [place], MSW,  LCSW-A  12/25/2020 2:51 PM

## 2020-12-26 ENCOUNTER — Other Ambulatory Visit: Payer: Self-pay

## 2020-12-26 DIAGNOSIS — F332 Major depressive disorder, recurrent severe without psychotic features: Secondary | ICD-10-CM | POA: Diagnosis not present

## 2020-12-26 MED ORDER — MIRTAZAPINE 45 MG PO TABS
45.0000 mg | ORAL_TABLET | Freq: Every day | ORAL | 0 refills | Status: DC
  Filled 2020-12-26: qty 7, 7d supply, fill #0

## 2020-12-26 MED ORDER — BUPROPION HCL ER (XL) 150 MG PO TB24
150.0000 mg | ORAL_TABLET | Freq: Every day | ORAL | 0 refills | Status: DC
  Filled 2020-12-26: qty 7, 7d supply, fill #0

## 2020-12-26 MED ORDER — VITAMIN B-1 100 MG PO TABS
100.0000 mg | ORAL_TABLET | Freq: Every day | ORAL | 0 refills | Status: DC
  Filled 2020-12-26: qty 7, 7d supply, fill #0

## 2020-12-26 MED ORDER — NALTREXONE HCL 50 MG PO TABS
50.0000 mg | ORAL_TABLET | Freq: Every day | ORAL | 0 refills | Status: DC
  Filled 2020-12-26: qty 7, 7d supply, fill #0

## 2020-12-26 MED ORDER — AMLODIPINE BESYLATE 5 MG PO TABS
5.0000 mg | ORAL_TABLET | Freq: Every day | ORAL | 0 refills | Status: DC
  Filled 2020-12-26: qty 7, 7d supply, fill #0

## 2020-12-26 MED ORDER — HYDROXYZINE HCL 50 MG PO TABS
50.0000 mg | ORAL_TABLET | Freq: Four times a day (QID) | ORAL | 0 refills | Status: DC | PRN
  Filled 2020-12-26: qty 28, 7d supply, fill #0

## 2020-12-26 NOTE — Plan of Care (Signed)
Pt rates depression 6/10, hopelessness 5/10 and anxiety 8/10. Pt denies SI, HI and AVH. Pt was educated on care plan and verbalizes understanding. Torrie Mayers RN Problem: Education: Goal: Knowledge of [facility] Education information/materials will improve Outcome: Progressing Goal: Emotional status will improve Outcome: Progressing Goal: Mental status will improve Outcome: Progressing Goal: Verbalization of understanding the information provided will improve Outcome: Progressing   Problem: Safety: Goal: Periods of time without injury will increase Outcome: Progressing   Problem: Education: Goal: Ability to make informed decisions regarding treatment will improve Outcome: Progressing   Problem: Medication: Goal: Compliance with prescribed medication regimen will improve Outcome: Progressing

## 2020-12-26 NOTE — [facility] Group Notes (Signed)
LCSW Group Therapy Note  12/26/2020 2:18 PM  Type of Therapy/Topic:  Group Therapy:  Emotion Regulation  Participation Level:  Active   Description of Group:   The purpose of this group is to assist patients in learning to regulate negative emotions and experience positive emotions. Patients will be guided to discuss ways in which they have been vulnerable to their negative emotions. These vulnerabilities will be juxtaposed with experiences of positive emotions or situations, and patients will be challenged to use positive emotions to combat negative ones. Special emphasis will be placed on coping with negative emotions in conflict situations, and patients will process healthy conflict resolution skills.  Therapeutic Goals: 1. Patient will identify two positive emotions or experiences to reflect on in order to balance out negative emotions 2. Patient will label two or more emotions that they find the most difficult to experience 3. Patient will demonstrate positive conflict resolution skills through discussion and/or role plays  Summary of Patient Progress: Patient was present for the entirety of the group. He spoke about feeling some anxiety around upcoming discharge just because it is something new. Patient talked about responding to negative emotions in a bad way by drinking but voiced realization that those problems remained there when he got sober. His comments and feedback were pertinent to the discussion.  Therapeutic Modalities:   Cognitive Behavioral Therapy Feelings Identification Dialectical Behavioral Therapy  Vilma Meckel. [name], MSW, LCSW, LCAS 12/26/2020 2:18 PM

## 2020-12-26 NOTE — Tx Team (Signed)
Interdisciplinary Treatment and Diagnostic Plan Update  12/26/2020 Time of Session: 8:30AM Lee Sparks MRN: [id number]  Principal Diagnosis: Severe recurrent major depression without psychotic features [facility])  Secondary Diagnoses: Principal Problem:   Severe recurrent major depression without psychotic features (HCC) Active Problems:   Hypertension   Cocaine abuse (HCC)   Severe alcohol use disorder (HCC)   Current Medications:  Current Facility-Administered Medications  Medication Dose Route Frequency Provider Last Rate Last Admin  . acetaminophen (TYLENOL) tablet 650 mg  650 mg Oral Q6H PRN [name], [name], MD      . albuterol (PROVENTIL) (2.5 MG/3ML) 0.083% nebulizer solution 3 mL  3 mL Inhalation Q4H PRN [name], [name], MD      . alum & mag hydroxide-simeth (MAALOX/MYLANTA) 200-200-20 MG/5ML suspension 30 mL  30 mL Oral Q4H PRN [name], [name], MD      . amLODipine (NORVASC) tablet 5 mg  5 mg Oral Daily Lee Sans, MD   5 mg at 12/26/20 0810  . buPROPion (WELLBUTRIN XL) 24 hr tablet 150 mg  150 mg Oral Daily Lee Sans, MD   150 mg at 12/26/20 0811  . folic acid (FOLVITE) tablet 1 mg  1 mg Oral Daily Lee Sans, MD   1 mg at 12/26/20 0811  . hydrOXYzine (ATARAX/VISTARIL) tablet 50 mg  50 mg Oral QHS Lee Sans, MD   50 mg at 12/25/20 2117  . loratadine (CLARITIN) tablet 10 mg  10 mg Oral Daily [name], Lee Denmark, MD   10 mg at 12/26/20 0811  . magnesium hydroxide (MILK OF MAGNESIA) suspension 30 mL  30 mL Oral Daily PRN [name], [name], MD      . mirtazapine (REMERON) tablet 45 mg  45 mg Oral QHS Lee Sans, MD   45 mg at 12/25/20 2117  . multivitamin with minerals tablet 1 tablet  1 tablet Oral Daily Lee Sans, MD   1 tablet at 12/26/20 0810  . naltrexone (DEPADE) tablet 50 mg  50 mg Oral Daily Lee Sans, MD   50 mg at 12/26/20 6599  . nicotine polacrilex (NICORETTE) gum 2 mg  2 mg Oral Q4H PRN Lee Sans, MD   2 mg at 12/26/20  3570  . thiamine (B-1) injection 100 mg  100 mg Intramuscular Once Lee Murdoch, NP      . thiamine tablet 100 mg  100 mg Oral Daily Lee Murdoch, NP   100 mg at 12/26/20 1779   PTA Medications: Medications Prior to Admission  Medication Sig Dispense Refill Last Dose  . buPROPion (WELLBUTRIN XL) 150 MG 24 hr tablet Take 1 tablet (150 mg total) by mouth daily. 7 tablet 0   . hydrOXYzine (ATARAX/VISTARIL) 50 MG tablet Take 1 tablet (50 mg total) by mouth every 6 (six) hours as needed for anxiety (sleep). 28 tablet 0   . mirtazapine (REMERON) 45 MG tablet Take 1 tablet (45 mg total) by mouth at bedtime. 7 tablet 0   . naltrexone (DEPADE) 50 MG tablet Take 1 tablet (50 mg total) by mouth daily. 7 tablet 0   . thiamine (VITAMIN B-1) 100 MG tablet Take 1 tablet (100 mg total) by mouth daily. 7 tablet 0     Patient Stressors: Medication change or noncompliance Substance abuse  Patient Strengths: Ability for insight Motivation for treatment/growth  Treatment Modalities: Medication Management, Group therapy, Case management,  1 to 1 session with clinician, Psychoeducation, Recreational therapy.   Physician Treatment  Plan for Primary Diagnosis: Severe recurrent major depression without psychotic features (HCC) Long Term Goal(s): Improvement in symptoms so as ready for discharge Improvement in symptoms so as ready for discharge   Short Term Goals: Ability to identify changes in lifestyle to reduce recurrence of condition will improve Ability to verbalize feelings will improve Ability to disclose and discuss suicidal ideas Ability to demonstrate self-control will improve Ability to identify and develop effective coping behaviors will improve Ability to maintain clinical measurements within normal limits will improve Compliance with prescribed medications will improve Ability to identify triggers associated with substance abuse/mental health issues will improve Ability to  identify changes in lifestyle to reduce recurrence of condition will improve Ability to verbalize feelings will improve Ability to disclose and discuss suicidal ideas Ability to demonstrate self-control will improve Ability to identify and develop effective coping behaviors will improve Ability to maintain clinical measurements within normal limits will improve Compliance with prescribed medications will improve Ability to identify triggers associated with substance abuse/mental health issues will improve  Medication Management: Evaluate patient's response, side effects, and tolerance of medication regimen.  Therapeutic Interventions: 1 to 1 sessions, Unit Group sessions and Medication administration.  Evaluation of Outcomes: Adequate for Discharge  Physician Treatment Plan for Secondary Diagnosis: Principal Problem:   Severe recurrent major depression without psychotic features (HCC) Active Problems:   Hypertension   Cocaine abuse (HCC)   Severe alcohol use disorder (HCC)  Long Term Goal(s): Improvement in symptoms so as ready for discharge Improvement in symptoms so as ready for discharge   Short Term Goals: Ability to identify changes in lifestyle to reduce recurrence of condition will improve Ability to verbalize feelings will improve Ability to disclose and discuss suicidal ideas Ability to demonstrate self-control will improve Ability to identify and develop effective coping behaviors will improve Ability to maintain clinical measurements within normal limits will improve Compliance with prescribed medications will improve Ability to identify triggers associated with substance abuse/mental health issues will improve Ability to identify changes in lifestyle to reduce recurrence of condition will improve Ability to verbalize feelings will improve Ability to disclose and discuss suicidal ideas Ability to demonstrate self-control will improve Ability to identify and develop  effective coping behaviors will improve Ability to maintain clinical measurements within normal limits will improve Compliance with prescribed medications will improve Ability to identify triggers associated with substance abuse/mental health issues will improve     Medication Management: Evaluate patient's response, side effects, and tolerance of medication regimen.  Therapeutic Interventions: 1 to 1 sessions, Unit Group sessions and Medication administration.  Evaluation of Outcomes: Adequate for Discharge   RN Treatment Plan for Primary Diagnosis: Severe recurrent major depression without psychotic features (HCC) Long Term Goal(s): Knowledge of disease and therapeutic regimen to maintain health will improve  Short Term Goals: Ability to remain free from injury will improve, Ability to verbalize frustration and anger appropriately will improve, Ability to demonstrate self-control, Ability to participate in decision making will improve, Ability to verbalize feelings will improve, Ability to disclose and discuss suicidal ideas, Ability to identify and develop effective coping behaviors will improve and Compliance with prescribed medications will improve  Medication Management: RN will administer medications as ordered by provider, will assess and evaluate patient's response and provide education to patient for prescribed medication. RN will report any adverse and/or side effects to prescribing provider.  Therapeutic Interventions: 1 on 1 counseling sessions, Psychoeducation, Medication administration, Evaluate responses to treatment, Monitor vital signs and CBGs as ordered, Perform/monitor  CIWA, COWS, AIMS and Fall Risk screenings as ordered, Perform wound care treatments as ordered.  Evaluation of Outcomes: Adequate for Discharge   LCSW Treatment Plan for Primary Diagnosis: Severe recurrent major depression without psychotic features (HCC) Long Term Goal(s): Safe transition to appropriate  next level of care at discharge, Engage patient in therapeutic group addressing interpersonal concerns.  Short Term Goals: Engage patient in aftercare planning with referrals and resources, Increase social support, Increase ability to appropriately verbalize feelings, Increase emotional regulation, Facilitate acceptance of mental health diagnosis and concerns, Facilitate patient progression through stages of change regarding substance use diagnoses and concerns, Identify triggers associated with mental health/substance abuse issues and Increase skills for wellness and recovery  Therapeutic Interventions: Assess for all discharge needs, 1 to 1 time with Social worker, Explore available resources and support systems, Assess for adequacy in community support network, Educate family and significant other(s) on suicide prevention, Complete Psychosocial Assessment, Interpersonal group therapy.  Evaluation of Outcomes: Adequate for Discharge   Progress in Treatment: Attending groups: Yes. Participating in groups: Yes. Taking medication as prescribed: Yes. Toleration medication: Yes. Family/Significant other contact made: Yes, individual(s) contacted:  attempt made to contact brother. Patient understands diagnosis: Yes. Discussing patient identified problems/goals with staff: Yes. Medical problems stabilized or resolved: No. Denies suicidal/homicidal ideation: No. Issues/concerns per patient self-inventory: No. Other: none.  New problem(s) identified: No, Describe:  none.  New Short Term/Long Term Goal(s): detox,  medication management for mood stabilization; elimination of SI thoughts; development of comprehensive mental wellness/sobriety plan. Update 12/26/20: No changes at this time.  Patient Goals: "Get over the sickness, the feeling of it." Pt interested in residential treatment.  Update 12/26/20: No changes at this time.  Discharge Plan or Barriers: Pt expressed interest in  residential/inpatient treatment.   Update 12/26/20: Patient was admitted to an [facility] called [address] and will be discharged to that location. Pt will follow up with outpatient treatment in that area.   Reason for Continuation of Hospitalization: Depression Medication stabilization Suicidal ideation Withdrawal symptoms  Estimated Length of Stay: 1-7 days  Attendees: Patient:  12/26/2020 9:04 AM  Physician: Les Pou, MD 12/26/2020 9:04 AM  Nursing:  12/26/2020 9:04 AM  RN Care Manager: 12/26/2020 9:04 AM  Social Worker: Vilma Meckel. [name], MSW, [place], LCAS 12/26/2020 9:04 AM  Recreational Therapist:   12/26/2020 9:04 AM  Other: Penni Homans, MSW, LCSW 12/26/2020 9:04 AM  Other: Kedric Bumgarner [place], MSW, LCSW-A 12/26/2020 9:04 AM  Other: 12/26/2020 9:04 AM    Scribe for Treatment Team: Adiana Smelcer A [place], LCSWA 12/26/2020 9:04 AM

## 2020-12-26 NOTE — Progress Notes (Signed)
Recreation Therapy Notes    Date: 12/26/2020  Time: 10:00 am   Location: Craft room   Behavioral response: Appropriate  Intervention Topic: Relaxation     Discussion/Intervention:  Group content today was focused on relaxation. The group defined relaxation and identified healthy ways to relax. Individuals expressed how much time they spend relaxing. Patients expressed how much their life would be if they did not make time for themselves to relax. The group stated ways they could improve their relaxation techniques in the future.  Individuals participated in the intervention "Time to Relax" where they had a chance to experience different relaxation techniques.  Clinical Observations/Feedback: Patient came to group and defined relaxation as sitting on the beach in a lounge chair drinking corona. He stated that he likes to sleep to relax. Participant identified overthinking and worrying as what normally stop him from relaxing.Individual was social with staff and peers while participating in the intervention. Azalynn Maxim LRT/CTRS           Naarah Borgerding 12/26/2020 12:50 PM

## 2020-12-26 NOTE — Progress Notes (Signed)
[facility] MD Progress Note  12/26/2020 1:35 PM CLAUDE WALDMAN  MRN:  [id number]   Principal Problem: Severe recurrent major depression without psychotic features (HCC) Diagnosis: Principal Problem:   Severe recurrent major depression without psychotic features (HCC) Active Problems:   Hypertension   Cocaine abuse (HCC)   Severe alcohol use disorder (HCC)  Follow-up for 50 year old male who presented to the hospital due to worsening depression, relapse on alcohol, and suicidal ideations.  No acute events overnight, medication compliant, attending to ADLs.   Subjective: Patient seen one-on-one today.  He notes he was able to have family bring over his belongings.  He is excited to have been accepted to the [facility], and is hoping to leave soon.  He denies any homicidal ideations, visual hallucinations, auditory hallucinations, suicidal ideations.  He continues to feel some depression and anxiety, but feels it is manageable with his current medication regimen.   Total Time spent with patient: 15 minutes  Past Psychiatric History: see H&P  Past Medical History:  Past Medical History:  Diagnosis Date  . Alcohol-induced pancreatitis   . Anemia   . Bronchitis     Past Surgical History:  Procedure Laterality Date  . CYSTECTOMY    . ESOPHAGOGASTRODUODENOSCOPY  07/28/2011   Procedure: ESOPHAGOGASTRODUODENOSCOPY (EGD);  Surgeon: Freddy Jaksch, MD;  Location: [facility] END[facility]COPY;  Service: Endoscopy;  Laterality: N/A;  . HERNIA REPAIR     Family History:  Family History  Problem Relation Age of Onset  . Hypertension Father   . Aneurysm Father   . Cancer Other   . Hypertension Mother   . Pneumonia Mother    Family Psychiatric  History: see H&P Social History:  Social History   Substance and Sexual Activity  Alcohol Use Yes  . Alcohol/week: 50.0 standard drinks  . Types: 50 Cans of beer per week   Comment: Equivalent of two 40 oz beers daily.     Social History   Substance and  Sexual Activity  Drug Use No    Social History   Socioeconomic History  . Marital status: Single    Spouse name: Not on file  . Number of children: Not on file  . Years of education: Not on file  . Highest education level: Not on file  Occupational History  . Not on file  Tobacco Use  . Smoking status: Current Every Day Smoker    Packs/day: 0.50    Years: 15.00    Pack years: 7.50    Types: Cigarettes    Last attempt to quit: 06/27/2011    Years since quitting: 9.5  . Smokeless tobacco: Never Used  Substance and Sexual Activity  . Alcohol use: Yes    Alcohol/week: 50.0 standard drinks    Types: 50 Cans of beer per week    Comment: Equivalent of two 40 oz beers daily.  . Drug use: No  . Sexual activity: Yes  Other Topics Concern  . Not on file  Social History Narrative  . Not on file   Social Determinants of Health   Financial Resource Strain: Not on file  Food Insecurity: Not on file  Transportation Needs: Not on file  Physical Activity: Not on file  Stress: Not on file  Social Connections: Not on file   Additional Social History:       Sleep: Good  Appetite:  Good  Current Medications: Current Facility-Administered Medications  Medication Dose Route Frequency Provider Last Rate Last Admin  . acetaminophen (TYLENOL) tablet 650  mg  650 mg Oral Q6H PRN [name], [name], MD      . albuterol (PROVENTIL) (2.5 MG/3ML) 0.083% nebulizer solution 3 mL  3 mL Inhalation Q4H PRN [name], [name], MD      . alum & mag hydroxide-simeth (MAALOX/MYLANTA) 200-200-20 MG/5ML suspension 30 mL  30 mL Oral Q4H PRN [name], [name], MD      . amLODipine (NORVASC) tablet 5 mg  5 mg Oral Daily [name], MD   5 mg at 12/26/20 0810  . buPROPion (WELLBUTRIN XL) 24 hr tablet 150 mg  150 mg Oral Daily [name], MD   150 mg at 12/26/20 0811  . folic acid (FOLVITE) tablet 1 mg  1 mg Oral Daily [name], MD   1 mg at 12/26/20 0811  . hydrOXYzine (ATARAX/VISTARIL)  tablet 50 mg  50 mg Oral QHS [name], MD   50 mg at 12/25/20 2117  . loratadine (CLARITIN) tablet 10 mg  10 mg Oral Daily [name], [name], MD   10 mg at 12/26/20 0811  . magnesium hydroxide (MILK OF MAGNESIA) suspension 30 mL  30 mL Oral Daily PRN [name], [name], MD      . mirtazapine (REMERON) tablet 45 mg  45 mg Oral QHS [name], MD   45 mg at 12/25/20 2117  . multivitamin with minerals tablet 1 tablet  1 tablet Oral Daily [name], MD   1 tablet at 12/26/20 0810  . naltrexone (DEPADE) tablet 50 mg  50 mg Oral Daily [name], MD   50 mg at 12/26/20 1610  . nicotine polacrilex (NICORETTE) gum 2 mg  2 mg Oral Q4H PRN [name], MD   2 mg at 12/26/20 9604  . thiamine (B-1) injection 100 mg  100 mg Intramuscular Once [name], NP      . thiamine tablet 100 mg  100 mg Oral Daily [name], NP   100 mg at 12/26/20 5409    Lab Results: No results found for this or any previous visit (from the past 48 hour(s)).  Blood Alcohol level:  Lab Results  Component Value Date   ETH <10 12/19/2020   ETH 341 (HH) 10/30/2020    Metabolic Disorder Labs: Lab Results  Component Value Date   HGBA1C 5.7 (H) 05/30/2020   No results found for: PROLACTIN Lab Results  Component Value Date   CHOL 206 (H) 05/30/2020   TRIG 187 (H) 05/30/2020   HDL 28 (L) 05/30/2020   CHOLHDL 7.4 (H) 05/30/2020   LDLCALC 144 (H) 05/30/2020   LDLCALC 98 06/28/2013    Physical Findings: AIMS:  , ,  ,  ,    CIWA:  CIWA-Ar Total: 0 COWS:     Musculoskeletal: Strength & Muscle Tone: within normal limits Gait & Station: normal Patient leans: N/A  Psychiatric Specialty Exam:  Presentation  General Appearance: Casual  Eye Contact:Fair  Speech:Clear and Coherent  Speech Volume:Normal  Handedness:Right   Mood and Affect  Mood:Depressed  Affect:Congruent   Thought Process  Thought Processes:Goal Directed  Descriptions of  Associations:Intact  Orientation:Full (Time, Place and Person)  Thought Content:Logical  History of Schizophrenia/Schizoaffective disorder:No  Duration of Psychotic Symptoms:N/A Hallucinations:None Ideas of Reference:None  Suicidal Thoughts:Denies Homicidal Thoughts:Denies  Sensorium  Memory:Immediate Good; Recent Good; Remote Good  Judgment:Impaired  Insight:Shallow   Executive Functions  Concentration:Fair  Attention Span:Fair  Recall:Fair  Fund of Knowledge:Fair  Language:Fair   Psychomotor Activity  Psychomotor Activity:Normal  Assets  Assets:Communication Skills; Desire for Improvement; Resilience   Sleep  Sleep:Fair 8   Physical Exam: Physical Exam  ROS  Blood pressure (!) 124/92, pulse 89, temperature 98.2 F (36.8 C), temperature source Oral, resp. rate 18, height 5\' 9"  (1.753 m), weight 83.9 kg, SpO2 100 %. Body mass index is 27.31 kg/m.   Treatment Plan Summary: Daily contact with patient to assess and evaluate symptoms and progress in treatment and Medication management  Patient is a 50 year old male with the above-stated past psychiatric history who is seen in follow-up.  Chart reviewed. Patient discussed with nursing. Reports feeling better physically. Last CIWA was 0.  Patient denies suicidal ideations, homicidal ideations, auditory hallucinations, visual hallucinations today.  Continue medications as below.  He has been accepted to the [facility] for residential substance abuse treatment.  Tentative discharge tomorrow.   Plan:  -continue inpatient psych admission; 15-minute checks; daily contact with patient to assess and evaluate symptoms and progress in treatment; psychoeducation.  -continue scheduled medications: . amLODipine  5 mg Oral Daily  . buPROPion  150 mg Oral Daily  . folic acid  1 mg Oral Daily  . hydrOXYzine  50 mg Oral QHS  . loratadine  10 mg Oral Daily  . mirtazapine  45 mg Oral QHS  . multivitamin with minerals   1 tablet Oral Daily  . naltrexone  50 mg Oral Daily  . thiamine  100 mg Intramuscular Once  . thiamine  100 mg Oral Daily    -continue PRN medications. acetaminophen, albuterol, alum & mag hydroxide-simeth, magnesium hydroxide, nicotine polacrilex  -Disposition: Tentative discharge to [facility] tomorrow pending bed availability  -  I certify that the patient does need, on a daily basis, active treatment furnished directly by or requiring the supervision of inpatient psychiatric facility personnel.    53, MD 12/26/2020, 1:35 PM

## 2020-12-26 NOTE — Plan of Care (Signed)
  Problem: Education: Goal: Knowledge of [facility] Education information/materials will improve Outcome: Progressing Goal: Emotional status will improve Outcome: Progressing Goal: Mental status will improve Outcome: Progressing Goal: Verbalization of understanding the information provided will improve Outcome: Progressing   Problem: Safety: Goal: Periods of time without injury will increase Outcome: Progressing   Problem: Medication: Goal: Compliance with prescribed medication regimen will improve Outcome: Progressing   

## 2020-12-26 NOTE — B[facility] Counselor (Signed)
CSW attempted to contact Catie at [facility] [phone]). HIPPA compliant voicemail left with contact information for follow through.   Vilma Meckel. [name], MSW, LCSW, LCAS 12/26/2020 12:35 PM

## 2020-12-26 NOTE — [facility] Counselor (Addendum)
CSW attempted to contact Katie at [facility]. Contact unable to be established and voicemail unable to be left. CSW will attempt contact later in the day.  CSW spoke with pt to update. He was informed that contact has not been able to be made. Pt stated that he would be ok with it being another day. CSW informed him that [facility] would continue to be contacted. No other concerns expressed. Contact ended without incident.  Vilma Meckel. [name], MSW, LCSW, LCAS 12/26/2020 9:30 AM

## 2020-12-26 NOTE — Progress Notes (Signed)
Pt rates depression 5/10 and anxiety 8/10. Pt has anxiety towards the thoughts of not being able to dc soon enough. Pt has a worrisome affect. Torrie Mayers RN

## 2020-12-26 NOTE — Plan of Care (Signed)
  Problem: Coping Skills Goal: STG - Patient will identify 3 positive coping skills strategies to use post d/c within 5 recreation therapy group sessions Description: STG - Patient will identify 3 positive coping skills strategies to use post d/c within 5 recreation therapy group sessions Outcome: Progressing   

## 2020-12-27 DIAGNOSIS — F332 Major depressive disorder, recurrent severe without psychotic features: Secondary | ICD-10-CM | POA: Diagnosis not present

## 2020-12-27 MED ORDER — HYDROXYZINE HCL 50 MG PO TABS: 50.0000 mg | ORAL_TABLET | Freq: Four times a day (QID) | ORAL | 1 refills | Status: DC | PRN

## 2020-12-27 MED ORDER — NALTREXONE HCL 50 MG PO TABS: 50.0000 mg | ORAL_TABLET | Freq: Every day | ORAL | 1 refills | Status: DC

## 2020-12-27 MED ORDER — MIRTAZAPINE 45 MG PO TABS: 45.0000 mg | ORAL_TABLET | Freq: Every day | ORAL | 1 refills | Status: DC

## 2020-12-27 MED ORDER — VITAMIN B-1 100 MG PO TABS: 100.0000 mg | ORAL_TABLET | Freq: Every day | ORAL | 1 refills | Status: DC

## 2020-12-27 MED ORDER — AMLODIPINE BESYLATE 5 MG PO TABS: 5.0000 mg | ORAL_TABLET | Freq: Every day | ORAL | 1 refills | Status: DC

## 2020-12-27 MED ORDER — BUPROPION HCL ER (XL) 150 MG PO TB24: 150.0000 mg | ORAL_TABLET | Freq: Every day | ORAL | 1 refills | Status: DC

## 2020-12-27 NOTE — Progress Notes (Signed)
Recreation Therapy Notes   Date: 12/27/2020  Time: 10:00 am   Location: Craft room   Behavioral response: Appropriate  Intervention Topic: Communication   Discussion/Intervention:  Group content today was focused on communication. The group defined communication and ways to communicate with others. Individuals stated reason why communication is important and some reasons to communicate with others. Patients expressed if they thought they were good at communicating with others and ways they could improve their communication skills. The group identified important parts of communication and some experiences they have had in the past with communication. The group participated in the intervention "What is that?", where they had a chance to test out their communication skills and identify ways to improve their communication techniques.  Clinical Observations/Feedback: Patient came to group and defined aggressive communication as cursing someone out.  Individual was social with staff and peers while participating in the intervention. Makaley Storts LRT/CTRS          Geno Sydnor 12/27/2020 11:49 AM

## 2020-12-27 NOTE — [facility] Counselor (Signed)
CSW attempted to contact Catie at [facility] [phone]). HIPPA compliant voicemail left with contact information for follow through.   Vilma Meckel. [name], MSW, LCSW, LCAS 12/27/2020 8:57 AM

## 2020-12-27 NOTE — BHH Suicide Risk Assessment (Signed)
[facility] Discharge Suicide Risk Assessment   Principal Problem: Severe recurrent major depression without psychotic features [facility]) Discharge Diagnoses: Principal Problem:   Severe recurrent major depression without psychotic features (HCC) Active Problems:   Hypertension   Cocaine abuse (HCC)   Severe alcohol use disorder (HCC)   Total Time spent with patient: 35 minutes- 25 minutes face-to-face contact with patient, 10 minutes documentation, coordination of care, scripts   Musculoskeletal: Strength & Muscle Tone: within normal limits Gait & Station: normal Patient leans: N/A  Psychiatric Specialty Exam  Presentation  General Appearance: Well Groomed  Eye Contact:Good  Speech:Clear and Coherent  Speech Volume:Normal  Handedness:Right   Mood and Affect  Mood:Euthymic  Duration of Depression Symptoms: Greater than two weeks  Affect:Congruent   Thought Process  Thought Processes:Goal Directed  Descriptions of Associations:Intact  Orientation:Full (Time, Place and Person)  Thought Content:Logical  History of Schizophrenia/Schizoaffective disorder:No  Duration of Psychotic Symptoms:No data recorded Hallucinations:Hallucinations: None  Ideas of Reference:None  Suicidal Thoughts:Suicidal Thoughts: No  Homicidal Thoughts:Homicidal Thoughts: No   Sensorium  Memory:Immediate Good; Recent Good; Remote Good  Judgment:Intact  Insight:Present   Executive Functions  Concentration:Good  Attention Span:Good  Recall:Good  Fund of Knowledge:Good  Language:Good   Psychomotor Activity  Psychomotor Activity:Psychomotor Activity: Normal   Assets  Assets:Communication Skills; Desire for Improvement; Leisure Time; Physical Health; Resilience; Social Support   Sleep  Sleep:Sleep: Fair Number of Hours of Sleep: 6   Physical Exam: Physical Exam ROS Blood pressure 132/86, pulse 91, temperature 98.5 F (36.9 C), temperature source Oral, resp. rate 17,  height 5\' 9"  (1.753 m), weight 83.9 kg, SpO2 98 %. Body mass index is 27.31 kg/m.  Mental Status Per Nursing Assessment::   On Admission:  Suicidal ideation indicated by patient  Demographic Factors:  Male, Caucasian and Unemployed  Loss Factors: Financial problems/change in socioeconomic status  Historical Factors: Impulsivity  Risk Reduction Factors:   Sense of responsibility to family, Religious beliefs about death, Positive social support, Positive therapeutic relationship and Positive coping skills or problem solving skills  Continued Clinical Symptoms:  Depression:   Recent sense of peace/wellbeing Alcohol/Substance Abuse/Dependencies Previous Psychiatric Diagnoses and Treatments  Cognitive Features That Contribute To Risk:  None    Suicide Risk:  Minimal: No identifiable suicidal ideation.  Patients presenting with no risk factors but with morbid ruminations; may be classified as minimal risk based on the severity of the depressive symptoms    Plan Of Care/Follow-up recommendations:  Activity:  as tolerated Diet:  low sodium heart healthy diet  [phone], MD 12/27/2020, 10:42 AM

## 2020-12-27 NOTE — Plan of Care (Signed)
  Problem: Coping Skills Goal: STG - Patient will identify 3 positive coping skills strategies to use post d/c within 5 recreation therapy group sessions Description: STG - Patient will identify 3 positive coping skills strategies to use post d/c within 5 recreation therapy group sessions Outcome: Completed/Met

## 2020-12-27 NOTE — Progress Notes (Signed)
Patient denies SI/HI/AVH, able to verbalize ability to remain safe upon discharge. Pt appears calm and cooperative, and no distress noted.   All Personal items in locker returned to pt. Upon discharge off the unit. Pt. Given supplemental supply of medications from pharmacy as well as prescriptions.   Pt States he will comply with discharge planning put into place and take MEDS as prescribed. Pt escorted out of the building by this writer/staff to safe transport services.

## 2020-12-27 NOTE — Progress Notes (Signed)
  [facility] Adult Case Management Discharge Plan :  Will you be returning to the same living situation after discharge:  No. At discharge, do you have transportation home?: Yes,  CSW will assist with transportation needs. Do you have the ability to pay for your medications: No.  Release of information consent forms completed and in the chart;  Patient's signature needed at discharge.  Patient to Follow up at:  Follow-up Information    [facility] Follow up.   Why: Elvera Bicker been accepted to [facility] for treatment.  Thanks! Contact information: [address],  [place], [place] [address]              Next level of care provider has access to [facility] Link:no  Safety Planning and Suicide Prevention discussed: Yes,  SPE completed with the patient.  Attempts to speak with collaterals were unsuccessful.    Have you used any form of tobacco in the last 30 days? (Cigarettes, Smokeless Tobacco, Cigars, and/or Pipes): No  Has patient been referred to the Quitline?: Patient refused referral  Patient has been referred for addiction treatment: Yes  Harden Mo, LCSW 12/27/2020, 11:12 AM

## 2020-12-27 NOTE — Progress Notes (Signed)
Recreation Therapy Notes  INPATIENT RECREATION TR PLAN  Patient Details Name: SHAMMOND ARAVE MRN: [id number] DOB: July 11, 1971 Today's Date: 12/27/2020  Rec Therapy Plan Is patient appropriate for Therapeutic Recreation?: Yes Treatment times per week: at least 3 Estimated Length of Stay: 5-7 days TR Treatment/Interventions: Group participation (Comment)  Discharge Criteria Pt will be discharged from therapy if:: Discharged Treatment plan/goals/alternatives discussed and agreed upon by:: Patient/family  Discharge Summary Short term goals set: Patient will identify 3 positive coping skills strategies to use post d/c within 5 recreation therapy group sessions Short term goals met: Complete Progress toward goals comments: Groups attended Which groups?: Communication,Anger management,Other (Comment) (Relaxation, Creative expressions) Reason goals not met: N/A Therapeutic equipment acquired: N/A Reason patient discharged from therapy: Discharge from hospital Pt/family agrees with progress & goals achieved: Yes Date patient discharged from therapy: 12/27/20   Madline Oesterling 12/27/2020, 11:53 AM

## 2020-12-27 NOTE — Plan of Care (Signed)
Patient during assessments this morning endorsed a normal mood and affect was congruent to mood. Pt. Denied si/hi/avh and endorsed ability to continue to remain safe on the unit. Pt. Orientation appeared grossly intact. Pt. Denied physical pain and endorsed toleration of medications thus far. Pt. Endorsed no problems with sleeping and or eating at this time. Pt. Had no complaints for this Clinical research associate. Discussed with the patient pending discharge plans that are in place; patient notified this RN that it was discussed he would be speaking to the doctor further about this today about potentially the facility providing safe transport.   Patient has been complaint with medications and unit procedures thus far. Pt. Has been observed eating good thus far. Pt. Has been able to remain safe on the unit thus far. Pt. Thus far mostly observe out in the milieu socializing with peers appropriately and watching tv.   Q x 15 minute observation checks in place/maintained for safety. Patient is provided with education throughout shift when appropriate and able.  Patient is given/offered medications per orders. Patient is encouraged to attend groups, participate in unit activities and continue with plan of care. Pt. Chart and plans of care reviewed. Pt. Given support and encouragement when appropriate and able.   Problem: Education: Goal: Knowledge of [facility] Education information/materials will improve Outcome: Progressing Note: Pt. Verbalized understanding of information provided.  Goal: Emotional status will improve Outcome: Progressing Note: Pt. Endorsed a euthymic mood today.  Goal: Mental status will improve Outcome: Progressing Note: Pt. Denied si/hi/avh and endorsed ability to continue to remain safe on the unit until pending discharge is compelte.  Problem: Safety: Goal: Periods of time without injury will increase Outcome: Progressing Note: Pt. Has been able to remain safe on the unit.

## 2020-12-27 NOTE — Progress Notes (Signed)
Patient is pleasant and cooperative. He is active on the unit and spends a good amount of time hanging out in the day room. He reports having high anxiety related to his upcoming discharge and the anticipation of leaving the hospital and going to rehab.  He denies SI/HI/AVH and pain at this encounter.  He does continue to endorse depression and reports that the longer he stays hospitalize,  the worse he will be.  He refused his Remeron tonight stating the it keeps him form falling into deep sleep. Feels the Vistaril should be enough for him to get through the night. Will seek staff for the Remeron if he finds himself unable to sleep later.  For now he is safe with 15 minute safety rounds and was encouraged to come to staff with any concerns.      Cleo Butler-Nicholson, LPN

## 2020-12-27 NOTE — BHH Counselor (Signed)
CSW spoke with [employer] who reports that patient has been ACCEPTED to the [facility].  She reports that the address is [address] [place], [place], [place] [address].  She reports that patient does NOT need to speak with anyone prior to arrival.  She reprots patient needs to arrive before 5pm.  Penni Homans, MSW, LCSW 12/27/2020 11:18 AM

## 2020-12-27 NOTE — Plan of Care (Signed)
PATIENT TO BE DISCHARGED PER MD ORDERS; CARE PLANNING CLOSED OUT PER DISCHARGE PROCEDURE.   Problem: Education: Goal: Knowledge of [facility] Education information/materials will improve 12/27/2020 1100 by Lenox Ponds, RN Outcome: Adequate for Discharge 12/27/2020 0825 by Lenox Ponds, RN Outcome: Progressing Note: Pt. Verbalized understanding of information provided.  Goal: Emotional status will improve 12/27/2020 1100 by Lenox Ponds, RN Outcome: Adequate for Discharge 12/27/2020 0825 by Lenox Ponds, RN Outcome: Progressing Note: Pt. Endorsed a euthymic mood today.  Goal: Mental status will improve 12/27/2020 1100 by Lenox Ponds, RN Outcome: Adequate for Discharge 12/27/2020 0825 by Lenox Ponds, RN Outcome: Progressing Note: Pt. Denied si/hi/avh and endorsed ability to continue to remain safe on the unit until pending discharge is compelte.  Goal: Verbalization of understanding the information provided will improve Outcome: Adequate for Discharge Problem: Safety: Goal: Periods of time without injury will increase 12/27/2020 1100 by Lenox Ponds, RN Outcome: Adequate for Discharge 12/27/2020 0825 by Lenox Ponds, RN Outcome: Progressing Note: Pt. Has been able to remain safe on the unit.  Problem: Education: Goal: Ability to make informed decisions regarding treatment will improve Outcome: Adequate for Discharge Problem: Medication: Goal: Compliance with prescribed medication regimen will improve Outcome: Adequate for Discharge

## 2020-12-27 NOTE — Discharge Summary (Signed)
Physician Discharge Summary Note  Patient:  Lee Sparks is an 50 y.o., male MRN:  [id number] DOB:  08/19/1970 Patient phone:  [phone] (home)  Patient address:   [facility] [place] [address],  Total Time spent with patient: 35 minutes- 25 minutes face-to-face contact with patient, 10 minutes documentation, coordination of care, scripts   Date of Admission:  12/20/2020 Date of Discharge: 12/27/2020  Reason for Admission:  50 year old male presenting to the hospital due to worsening depression, relapse on alcohol, and suicidal ideations  Principal Problem: Severe recurrent major depression without psychotic features [facility]) Discharge Diagnoses: Principal Problem:   Severe recurrent major depression without psychotic features (HCC) Active Problems:   Hypertension   Cocaine abuse (HCC)   Severe alcohol use disorder (HCC)   Past Psychiatric History: Has been to [facility] in the past for detox. No history of DTs or seizures from alcohol withdrawals, but he does experience tremors. Previously here in April for similar circumstances. Improved with Remeron and Wellbutrin. Did not follow-up after discharge.   Past Medical History:  Past Medical History:  Diagnosis Date  . Alcohol-induced pancreatitis   . Anemia   . Bronchitis     Past Surgical History:  Procedure Laterality Date  . CYSTECTOMY    . ESOPHAGOGASTRODUODENOSCOPY  07/28/2011   Procedure: ESOPHAGOGASTRODUODENOSCOPY (EGD);  Surgeon: Freddy Jaksch, MD;  Location[facility] ENDOSCOPY;  Service: Endoscopy;  Laterality: N/A;  . HERNIA REPAIR     Family History:  Family History  Problem Relation Age of Onset  . Hypertension Father   . Aneurysm Father   . Cancer Other   . Hypertension Mother   . Pneumonia Mother    Family Psychiatric  History: None reported Social History:  Social History   Substance and Sexual Activity  Alcohol Use Yes  . Alcohol/week: 50.0 standard drinks  . Types: 50 Cans of beer per week   Comment:  Equivalent of two 40 oz beers daily.     Social History   Substance and Sexual Activity  Drug Use No    Social History   Socioeconomic History  . Marital status: Single    Spouse name: Not on file  . Number of children: Not on file  . Years of education: Not on file  . Highest education level: Not on file  Occupational History  . Not on file  Tobacco Use  . Smoking status: Current Every Day Smoker    Packs/day: 0.50    Years: 15.00    Pack years: 7.50    Types: Cigarettes    Last attempt to quit: 06/27/2011    Years since quitting: 9.5  . Smokeless tobacco: Never Used  Substance and Sexual Activity  . Alcohol use: Yes    Alcohol/week: 50.0 standard drinks    Types: 50 Cans of beer per week    Comment: Equivalent of two 40 oz beers daily.  . Drug use: No  . Sexual activity: Yes  Other Topics Concern  . Not on file  Social History Narrative  . Not on file   Social Determinants of Health   Financial Resource Strain: Not on file  Food Insecurity: Not on file  Transportation Needs: Not on file  Physical Activity: Not on file  Stress: Not on file  Social Connections: Not on file    Hospital Course:  50 year old male presenting to the hospital due to worsening depression, relapse on alcohol, and suicidal ideations. He was placed on an ativan taper and completed acute alcohol detox  without complications. He was also restarted on his home medications as below for depression, anxiety, and HTN. Mood gradually improved during stay. He denies SI/HI/AH/VH, and will be discharging to the [facility] for continued substance abuse treatment.   Physical Findings: AIMS:  , ,  ,  ,    CIWA:  CIWA-Ar Total: 0 COWS:     Musculoskeletal: Strength & Muscle Tone: within normal limits Gait & Station: normal Patient leans: N/A                Psychiatric Specialty Exam:  Presentation  General Appearance: Well Groomed  Eye Contact:Good  Speech:Clear and  Coherent  Speech Volume:Normal  Handedness:Right   Mood and Affect  Mood:Euthymic  Affect:Congruent   Thought Process  Thought Processes:Goal Directed  Descriptions of Associations:Intact  Orientation:Full (Time, Place and Person)  Thought Content:Logical  History of Schizophrenia/Schizoaffective disorder:No  Duration of Psychotic Symptoms:No data recorded Hallucinations:Hallucinations: None  Ideas of Reference:None  Suicidal Thoughts:Suicidal Thoughts: No  Homicidal Thoughts:Homicidal Thoughts: No   Sensorium  Memory:Immediate Good; Recent Good; Remote Good  Judgment:Intact  Insight:Present   Executive Functions  Concentration:Good  Attention Span:Good  Recall:Good  Fund of Knowledge:Good  Language:Good   Psychomotor Activity  Psychomotor Activity:Psychomotor Activity: Normal   Assets  Assets:Communication Skills; Desire for Improvement; Leisure Time; Physical Health; Resilience; Social Support   Sleep  Sleep:Sleep: Fair Number of Hours of Sleep: 6    Physical Exam: Physical Exam Vitals and nursing note reviewed.  Constitutional:      Appearance: Normal appearance.  HENT:     Head: Normocephalic and atraumatic.     Right Ear: External ear normal.     Left Ear: External ear normal.     Nose: Nose normal.     Mouth/Throat:     Mouth: Mucous membranes are moist.     Pharynx: Oropharynx is clear.  Eyes:     Extraocular Movements: Extraocular movements intact.     Conjunctiva/sclera: Conjunctivae normal.     Pupils: Pupils are equal, round, and reactive to light.  Cardiovascular:     Rate and Rhythm: Normal rate.     Pulses: Normal pulses.  Pulmonary:     Effort: Pulmonary effort is normal.     Breath sounds: Normal breath sounds.  Abdominal:     General: Abdomen is flat.     Palpations: Abdomen is soft.  Musculoskeletal:        General: No swelling. Normal range of motion.     Cervical back: Normal range of motion and neck  supple.  Skin:    General: Skin is warm and dry.  Neurological:     General: No focal deficit present.     Mental Status: He is alert and oriented to person, place, and time.  Psychiatric:        Mood and Affect: Mood normal.        Behavior: Behavior normal.        Thought Content: Thought content normal.        Judgment: Judgment normal.    Review of Systems  Constitutional: Negative.   HENT: Negative.   Eyes: Negative.   Respiratory: Negative.   Cardiovascular: Negative.   Gastrointestinal: Negative.   Genitourinary: Negative.   Musculoskeletal: Negative.   Skin: Negative.   Neurological: Negative.   Endo/Heme/Allergies: Negative.   Psychiatric/Behavioral: Negative for hallucinations, substance abuse and suicidal ideas. The patient is not nervous/anxious and does not have insomnia.    Blood pressure 132/86, pulse 91,  temperature 98.5 F (36.9 C), temperature source Oral, resp. rate 17, height 5\' 9"  (1.753 m), weight 83.9 kg, SpO2 98 %. Body mass index is 27.31 kg/m.   Have you used any form of tobacco in the last 30 days? (Cigarettes, Smokeless Tobacco, Cigars, and/or Pipes): No  Has this patient used any form of tobacco in the last 30 days? (Cigarettes, Smokeless Tobacco, Cigars, and/or Pipes) No Blood Alcohol level:  Lab Results  Component Value Date   ETH <10 12/19/2020   ETH 341 (HH) 10/30/2020    Metabolic Disorder Labs:  Lab Results  Component Value Date   HGBA1C 5.7 (H) 05/30/2020   No results found for: PROLACTIN Lab Results  Component Value Date   CHOL 206 (H) 05/30/2020   TRIG 187 (H) 05/30/2020   HDL 28 (L) 05/30/2020   CHOLHDL 7.4 (H) 05/30/2020   LDLCALC 144 (H) 05/30/2020   LDLCALC 98 06/28/2013    See Psychiatric Specialty Exam and Suicide Risk Assessment completed by Attending Physician prior to discharge.  Discharge destination:  Other:  [facility]  Is patient on multiple antipsychotic therapies at discharge:  No   Has Patient had  three or more failed trials of antipsychotic monotherapy by history:  No  Recommended Plan for Multiple Antipsychotic Therapies: NA  Discharge Instructions    Diet - low sodium heart healthy   Complete by: As directed    Increase activity slowly   Complete by: As directed      Allergies as of 12/27/2020      Reactions   Grapefruit Concentrate Hives   Grapefruit Diet Or [extra Strength Grapefruit] Hives   Pt states he has a reaction to grapefruit.       Medication List    TAKE these medications     Indication  amLODipine 5 MG tablet Commonly known as: NORVASC Take 1 tablet (5 mg total) by mouth daily.  Indication: High Blood Pressure Disorder   buPROPion 150 MG 24 hr tablet Commonly known as: WELLBUTRIN XL Take 1 tablet (150 mg total) by mouth daily.  Indication: Major Depressive Disorder   hydrOXYzine 50 MG tablet Commonly known as: ATARAX/VISTARIL Take 1 tablet (50 mg total) by mouth every 6 (six) hours as needed for anxiety (sleep).  Indication: Feeling Anxious, State of Being Sedated   mirtazapine 45 MG tablet Commonly known as: REMERON Take 1 tablet (45 mg total) by mouth at bedtime.  Indication: Major Depressive Disorder   naltrexone 50 MG tablet Commonly known as: DEPADE Take 1 tablet (50 mg total) by mouth daily.  Indication: Abuse or Misuse of Alcohol   thiamine 100 MG tablet Commonly known as: Vitamin B-1 Take 1 tablet (100 mg total) by mouth daily.  Indication: Deficiency of Vitamin B1        Follow-up recommendations:  Activity:  as tolerated Diet:  low sodium heart healthy diet  Comments:  7-day supply free medication provided along with printed 30-day scripts with 1 refill  Signed: 04-25-1985, MD 12/27/2020, 10:42 AM

## 2021-10-28 ENCOUNTER — Other Ambulatory Visit: Payer: Self-pay

## 2021-10-28 ENCOUNTER — Emergency Department: Payer: Medicaid Other

## 2021-10-28 ENCOUNTER — Emergency Department
Admission: EM | Admit: 2021-10-28 | Discharge: 2021-10-29 | Disposition: A | Discharge status: Other Acute Inpatient Hospital | Payer: 59 | Attending: Emergency Medicine | Admitting: Emergency Medicine

## 2021-10-28 DIAGNOSIS — F17210 Nicotine dependence, cigarettes, uncomplicated: Secondary | ICD-10-CM | POA: Insufficient documentation

## 2021-10-28 DIAGNOSIS — F1020 Alcohol dependence, uncomplicated: Secondary | ICD-10-CM | POA: Diagnosis present

## 2021-10-28 DIAGNOSIS — R079 Chest pain, unspecified: Secondary | ICD-10-CM | POA: Insufficient documentation

## 2021-10-28 DIAGNOSIS — Z046 Encounter for general psychiatric examination, requested by authority: Secondary | ICD-10-CM | POA: Insufficient documentation

## 2021-10-28 DIAGNOSIS — R000 Tachycardia, unspecified: Secondary | ICD-10-CM | POA: Insufficient documentation

## 2021-10-28 DIAGNOSIS — F332 Major depressive disorder, recurrent severe without psychotic features: Secondary | ICD-10-CM | POA: Diagnosis present

## 2021-10-28 DIAGNOSIS — R45851 Suicidal ideations: Secondary | ICD-10-CM | POA: Insufficient documentation

## 2021-10-28 DIAGNOSIS — Z20822 Contact with and (suspected) exposure to covid-19: Secondary | ICD-10-CM | POA: Insufficient documentation

## 2021-10-28 DIAGNOSIS — Y905 Blood alcohol level of 100-119 mg/100 ml: Secondary | ICD-10-CM | POA: Insufficient documentation

## 2021-10-28 DIAGNOSIS — I10 Essential (primary) hypertension: Secondary | ICD-10-CM | POA: Diagnosis present

## 2021-10-28 DIAGNOSIS — F1010 Alcohol abuse, uncomplicated: Secondary | ICD-10-CM

## 2021-10-28 DIAGNOSIS — E871 Hypo-osmolality and hyponatremia: Secondary | ICD-10-CM | POA: Insufficient documentation

## 2021-10-28 DIAGNOSIS — E878 Other disorders of electrolyte and fluid balance, not elsewhere classified: Secondary | ICD-10-CM | POA: Insufficient documentation

## 2021-10-28 LAB — CBC
HCT: 44.8 % (ref 39.0–52.0)
Hemoglobin: 15.5 g/dL (ref 13.0–17.0)
MCH: 29.3 pg (ref 26.0–34.0)
MCHC: 34.6 g/dL (ref 30.0–36.0)
MCV: 84.7 fL (ref 80.0–100.0)
Platelets: 158 10*3/uL (ref 150–400)
RBC: 5.29 MIL/uL (ref 4.22–5.81)
RDW: 15.4 % (ref 11.5–15.5)
WBC: 8.8 10*3/uL (ref 4.0–10.5)
nRBC: 0 % (ref 0.0–0.2)

## 2021-10-28 LAB — T4, FREE: Free T4: 0.83 ng/dL (ref 0.61–1.12)

## 2021-10-28 LAB — COMPREHENSIVE METABOLIC PANEL
ALT: 55 U/L — ABNORMAL HIGH (ref 0–44)
AST: 106 U/L — ABNORMAL HIGH (ref 15–41)
Albumin: 4.1 g/dL (ref 3.5–5.0)
Alkaline Phosphatase: 60 U/L (ref 38–126)
Anion gap: 14 (ref 5–15)
BUN: 8 mg/dL (ref 6–20)
CO2: 21 mmol/L — ABNORMAL LOW (ref 22–32)
Calcium: 8.6 mg/dL — ABNORMAL LOW (ref 8.9–10.3)
Chloride: 96 mmol/L — ABNORMAL LOW (ref 98–111)
Creatinine, Ser: 0.84 mg/dL (ref 0.61–1.24)
GFR, Estimated: >60 mL/min (ref >60)
Glucose, Bld: 148 mg/dL — ABNORMAL HIGH (ref 70–99)
Potassium: 3.7 mmol/L (ref 3.5–5.1)
Sodium: 131 mmol/L — ABNORMAL LOW (ref 135–145)
Total Bilirubin: 0.7 mg/dL (ref 0.3–1.2)
Total Protein: 7.9 g/dL (ref 6.5–8.1)

## 2021-10-28 LAB — URINE DRUG SCREEN, QUALITATIVE (ARMC ONLY)
Amphetamines, Ur Screen: NOT DETECTED
Barbiturates, Ur Screen: NOT DETECTED
Benzodiazepine, Ur Scrn: NOT DETECTED
Cannabinoid 50 Ng, Ur ~~LOC~~: NOT DETECTED
Cocaine Metabolite,Ur ~~LOC~~: NOT DETECTED
MDMA (Ecstasy)Ur Screen: NOT DETECTED
Methadone Scn, Ur: NOT DETECTED
Opiate, Ur Screen: NOT DETECTED
Phencyclidine (PCP) Ur S: NOT DETECTED
Tricyclic, Ur Screen: NOT DETECTED

## 2021-10-28 LAB — TROPONIN I (HIGH SENSITIVITY)
Troponin I (High Sensitivity): 7 ng/L (ref <18)
Troponin I (High Sensitivity): 7 ng/L (ref <18)

## 2021-10-28 LAB — SALICYLATE LEVEL: Salicylate Lvl: <7 mg/dL — ABNORMAL LOW (ref 7.0–30.0)

## 2021-10-28 LAB — MAGNESIUM: Magnesium: 2.3 mg/dL (ref 1.7–2.4)

## 2021-10-28 LAB — ETHANOL: Alcohol, Ethyl (B): 440 mg/dL (ref <10)

## 2021-10-28 LAB — TSH: TSH: 3.611 u[IU]/mL (ref 0.350–4.500)

## 2021-10-28 LAB — ACETAMINOPHEN LEVEL: Acetaminophen (Tylenol), Serum: 14 ug/mL (ref 10–30)

## 2021-10-28 MED ORDER — SODIUM CHLORIDE 0.9 % IV BOLUS
1000.0000 mL | Freq: Once | INTRAVENOUS | Status: AC
  Administered 2021-10-28: 1000 mL via INTRAVENOUS

## 2021-10-28 NOTE — ED Notes (Signed)
Pt reports unable to void at this time.

## 2021-10-28 NOTE — ED Triage Notes (Signed)
Pt presents via POV c/o chest pain and suicidal ideation per pt report. Pt reports chest pain currently. Reports "I am also here because I need help. I want to get off alcohol". Endorses HI currently.  ?

## 2021-10-28 NOTE — ED Provider Notes (Signed)
? ?[facility] ?Provider Note ? ? ? Event Date/Time  ? First MD Initiated Contact with Patient 10/28/21 2155   ?  (approximate) ? ? ?History  ? ?Chest Pain and Suicidal ? ? ?HPI ? ?Lee Sparks is a 51 y.o. male with history of EtOH abuse who comes in with chest pain and concern for SI.  Patient was reportedly found on the side of a bridge trying to jump off when somebody found him and brought him into the ER reportedly.  Patient states he does not remember being on a bridge.  He denies any falls or hitting his head.  He reports being here to try to get off alcohol.  He reports drinking daily and drinking today before coming in.  Does report SI with a plan to cut himself.  He does report some chest pain and shortness of breath when I asked him about that as well.  He reports not eating and drinking food very well the last few days. ? ? ? ? ?Physical Exam  ? ?Triage Vital Signs: ?ED Triage Vitals  ?Enc Vitals Group  ?   BP 10/28/21 2143 (!) 139/110  ?   Pulse Rate 10/28/21 2143 (!) 143  ?   Resp 10/28/21 2143 (!) 22  ?   Temp --   ?   Temp src --   ?   SpO2 10/28/21 2143 98 %  ?   Weight --   ?   Height --   ?   Head Circumference --   ?   Peak Flow --   ?   Pain Score 10/28/21 2139 9  ?   Pain Loc --   ?   Pain Edu? --   ?   Excl. in GC? --   ? ? ?Most recent vital signs: ?Vitals:  ? 10/28/21 2143  ?BP: (!) 139/110  ?Pulse: (!) 143  ?Resp: (!) 22  ?SpO2: 98%  ? ? ? ?General: Awake, no distress. ?CV:  Good peripheral perfusion.tachy  ?Resp:  Normal effort.  ?Abd:  No distention. Non tender ?Other:  No swelling  ?Patient moving all extremities well.  Pupils equal and reactive.  No evidence of trauma on his head ? ? ?ED Results / Procedures / Treatments  ? ?Labs ?(all labs ordered are listed, but only abnormal results are displayed) ?Labs Reviewed  ?CBC  ?COMPREHENSIVE METABOLIC PANEL  ?ETHANOL  ?SALICYLATE LEVEL  ?ACETAMINOPHEN LEVEL  ?URINE DRUG SCREEN, QUALITATIVE ([facility] ONLY)  ?TROPONIN I  (HIGH SENSITIVITY)  ? ? ? ?EKG ? ?My interpretation of EKG: ? ?Narrow complex tachycardia with a rate of 163 without any ST elevation or T wave inversions.  Does look like he possibly has a P wave ? ?RADIOLOGY ?I have reviewed the xray personally and no evidence of any edema ? ?PROCEDURES: ? ?Critical Care performed: No ? ?Procedures ? ? ?MEDICATIONS ORDERED IN ED: ?Medications  ?sodium chloride 0.9 % bolus 1,000 mL (1,000 mLs Intravenous New Bag/Given 10/28/21 2209)  ? ? ? ?IMPRESSION / MDM / ASSESSMENT AND PLAN / ED COURSE  ?I reviewed the triage vital signs and the nursing notes. ?             ?               ?Patient comes in with significant tachycardia with a EKG initially that looked like it was sinus tachycardia versus SVT patient was sitting the room I went to evaluate him for the  first time his heart rate was definitely sinus on the monitor with a rate of 120.  I suspect related to dehydration so we will give some IV fluid.  Labs ordered evaluate for any Electra abnormalities, AKI ? ?Labs show far show some elevated LFTs and a alcoholic pattern.  Slightly low chloride, sodium.  Patient given some IV fluids ?Ethanol significantly elevated at 440 ?CBC normal ? ?Given patient has SI with active plan patient placed under IVC. ? ?On repeat assessment prior to leaving patient's heart rate is down into the 90s and is sinus.  Patient just needs 1 more troponin and will be medically cleared ? ? ?The patient has been placed in psychiatric observation due to the need to provide a safe environment for the patient while obtaining psychiatric consultation and evaluation, as well as ongoing medical and medication management to treat the patient's condition.  The patient has been placed under full IVC at this time.  ? ?The patient is on the cardiac monitor to evaluate for evidence of arrhythmia and/or significant heart rate changes. ? ? ?FINAL CLINICAL IMPRESSION(S) / ED DIAGNOSES  ? ?Final diagnoses:  ?Suicidal ideation   ?ETOH abuse  ?Suicide ideation  ? ? ? ?Rx / DC Orders  ? ?ED Discharge Orders   ? ? None  ? ?  ? ? ? ?Note:  This document was prepared using Dragon voice recognition software and may include unintentional dictation errors. ?  ?Concha Se, MD ?10/28/21 2322 ? ?

## 2021-10-28 NOTE — ED Notes (Signed)
Collected and placed in belongings bag with patient label.  ? ?$159 in dollar bills in black wallet.  ? ?$1.57 collected in change ? ?2 silver cross jewelry  ? ?Grey hat ?Black shoes ?White socks ?Black tshirt ?Black shorts ?Black jacket  ?

## 2021-10-29 ENCOUNTER — Encounter: Payer: Self-pay | Admitting: Psychiatry

## 2021-10-29 ENCOUNTER — Other Ambulatory Visit: Payer: Self-pay

## 2021-10-29 ENCOUNTER — Inpatient Hospital Stay
Admission: AD | Admit: 2021-10-29 | Discharge: 2021-11-06 | DRG: 897 | Disposition: A | Discharge status: Home / Self Care | Payer: 59 | Source: Intra-hospital | Attending: Psychiatry | Admitting: Psychiatry

## 2021-10-29 DIAGNOSIS — I10 Essential (primary) hypertension: Secondary | ICD-10-CM | POA: Diagnosis present

## 2021-10-29 DIAGNOSIS — J310 Chronic rhinitis: Secondary | ICD-10-CM | POA: Diagnosis present

## 2021-10-29 DIAGNOSIS — K219 Gastro-esophageal reflux disease without esophagitis: Secondary | ICD-10-CM | POA: Diagnosis present

## 2021-10-29 DIAGNOSIS — G4700 Insomnia, unspecified: Secondary | ICD-10-CM | POA: Diagnosis present

## 2021-10-29 DIAGNOSIS — R45851 Suicidal ideations: Secondary | ICD-10-CM | POA: Diagnosis present

## 2021-10-29 DIAGNOSIS — F332 Major depressive disorder, recurrent severe without psychotic features: Secondary | ICD-10-CM | POA: Diagnosis present

## 2021-10-29 DIAGNOSIS — F1010 Alcohol abuse, uncomplicated: Principal | ICD-10-CM | POA: Diagnosis present

## 2021-10-29 DIAGNOSIS — Z20822 Contact with and (suspected) exposure to covid-19: Secondary | ICD-10-CM | POA: Diagnosis present

## 2021-10-29 DIAGNOSIS — F10139 Alcohol abuse with withdrawal, unspecified: Principal | ICD-10-CM | POA: Diagnosis present

## 2021-10-29 DIAGNOSIS — F17210 Nicotine dependence, cigarettes, uncomplicated: Secondary | ICD-10-CM | POA: Diagnosis present

## 2021-10-29 HISTORY — DX: Unspecified asthma, uncomplicated: J45.909

## 2021-10-29 LAB — RESP PANEL BY RT-PCR (FLU A&B, COVID) ARPGX2
Influenza A by PCR: NEGATIVE
Influenza B by PCR: NEGATIVE
SARS Coronavirus 2 by RT PCR: NEGATIVE

## 2021-10-29 LAB — ETHANOL: Alcohol, Ethyl (B): 108 mg/dL — ABNORMAL HIGH (ref <10)

## 2021-10-29 MED ORDER — LORAZEPAM 2 MG PO TABS
0.0000 mg | ORAL_TABLET | Freq: Four times a day (QID) | ORAL | Status: DC
  Administered 2021-10-29 (×2): 2 mg via ORAL
  Filled 2021-10-29 (×2): qty 1

## 2021-10-29 MED ORDER — NICOTINE 14 MG/24HR TD PT24
14.0000 mg | MEDICATED_PATCH | Freq: Every day | TRANSDERMAL | Status: DC
  Administered 2021-10-29 – 2021-10-30 (×2): 14 mg via TRANSDERMAL
  Filled 2021-10-29 (×6): qty 1

## 2021-10-29 MED ORDER — PNEUMOCOCCAL 20-VAL CONJ VACC 0.5 ML IM SUSY
0.5000 mL | PREFILLED_SYRINGE | INTRAMUSCULAR | Status: DC
  Filled 2021-10-29: qty 0.5

## 2021-10-29 MED ORDER — LORAZEPAM 1 MG PO TABS
1.0000 mg | ORAL_TABLET | ORAL | Status: AC | PRN
  Administered 2021-10-29 – 2021-10-30 (×3): 2 mg via ORAL
  Administered 2021-10-30: 1 mg via ORAL
  Administered 2021-10-30 (×5): 2 mg via ORAL
  Administered 2021-10-31: 1 mg via ORAL
  Filled 2021-10-29 (×6): qty 2
  Filled 2021-10-29: qty 1
  Filled 2021-10-29 (×2): qty 2
  Filled 2021-10-29: qty 1

## 2021-10-29 MED ORDER — ACETAMINOPHEN 325 MG PO TABS
650.0000 mg | ORAL_TABLET | Freq: Four times a day (QID) | ORAL | Status: DC | PRN
  Administered 2021-10-31: 650 mg via ORAL
  Filled 2021-10-29: qty 2

## 2021-10-29 MED ORDER — THIAMINE HCL 100 MG PO TABS
100.0000 mg | ORAL_TABLET | Freq: Every day | ORAL | Status: DC
  Administered 2021-10-29: 100 mg via ORAL
  Filled 2021-10-29: qty 1

## 2021-10-29 MED ORDER — ADULT MULTIVITAMIN W/MINERALS CH
1.0000 | ORAL_TABLET | Freq: Every day | ORAL | Status: DC
  Administered 2021-10-29 – 2021-11-06 (×9): 1 via ORAL
  Filled 2021-10-29 (×9): qty 1

## 2021-10-29 MED ORDER — LORAZEPAM 2 MG/ML IJ SOLN: 0.0000 mg | Freq: Four times a day (QID) | INTRAMUSCULAR | Status: DC

## 2021-10-29 MED ORDER — MAGNESIUM HYDROXIDE 400 MG/5ML PO SUSP: 30.0000 mL | Freq: Every day | ORAL | Status: DC | PRN

## 2021-10-29 MED ORDER — ALUM & MAG HYDROXIDE-SIMETH 200-200-20 MG/5ML PO SUSP
30.0000 mL | ORAL | Status: DC | PRN
  Administered 2021-11-02: 30 mL via ORAL
  Filled 2021-10-29: qty 30

## 2021-10-29 MED ORDER — THIAMINE HCL 100 MG PO TABS
100.0000 mg | ORAL_TABLET | Freq: Every day | ORAL | Status: DC
  Administered 2021-10-30 – 2021-11-06 (×8): 100 mg via ORAL
  Filled 2021-10-29 (×9): qty 1

## 2021-10-29 MED ORDER — THIAMINE HCL 100 MG/ML IJ SOLN: 100.0000 mg | Freq: Every day | INTRAMUSCULAR | Status: DC

## 2021-10-29 MED ORDER — LORAZEPAM 2 MG/ML IJ SOLN: 1.0000 mg | INTRAMUSCULAR | Status: AC | PRN

## 2021-10-29 MED ORDER — LORAZEPAM 2 MG/ML IJ SOLN: 0.0000 mg | Freq: Two times a day (BID) | INTRAMUSCULAR | Status: DC

## 2021-10-29 MED ORDER — LORAZEPAM 2 MG PO TABS: 0.0000 mg | ORAL_TABLET | Freq: Two times a day (BID) | ORAL | Status: DC

## 2021-10-29 MED ORDER — FOLIC ACID 1 MG PO TABS
1.0000 mg | ORAL_TABLET | Freq: Every day | ORAL | Status: DC
  Administered 2021-10-29 – 2021-11-06 (×9): 1 mg via ORAL
  Filled 2021-10-29 (×9): qty 1

## 2021-10-29 MED ORDER — THIAMINE HCL 100 MG/ML IJ SOLN
100.0000 mg | Freq: Every day | INTRAMUSCULAR | Status: DC
  Filled 2021-10-29 (×2): qty 2

## 2021-10-29 NOTE — ED Provider Notes (Signed)
----------------------------------------- ?  2:38 AM on 10/29/2021 ?----------------------------------------- ? ?Repeat troponin is negative.  The patient is medically cleared and pending psychiatry disposition. ?  Dionne Bucy, MD ?10/29/21 [phone] ? ?

## 2021-10-29 NOTE — Plan of Care (Signed)
?  Problem: Education: ?Goal: Knowledge of [facility] Education information/materials will improve ?Outcome: Not Progressing ?Goal: Emotional status will improve ?Outcome: Not Progressing ?Goal: Mental status will improve ?Outcome: Not Progressing ?Goal: Verbalization of understanding the information provided will improve ?Outcome: Not Progressing ?  ?Problem: Activity: ?Goal: Interest or engagement in activities will improve ?Outcome: Not Progressing ?Goal: Sleeping patterns will improve ?Outcome: Not Progressing ?  ?Problem: Coping: ?Goal: Ability to verbalize frustrations and anger appropriately will improve ?Outcome: Not Progressing ?Goal: Ability to demonstrate self-control will improve ?Outcome: Not Progressing ?  ?Problem: Health Behavior/Discharge Planning: ?Goal: Identification of resources available to assist in meeting health care needs will improve ?Outcome: Not Progressing ?Goal: Compliance with treatment plan for underlying cause of condition will improve ?Outcome: Not Progressing ?  ?Problem: Physical Regulation: ?Goal: Ability to maintain clinical measurements within normal limits will improve ?Outcome: Not Progressing ?  ?Problem: Safety: ?Goal: Periods of time without injury will increase ?Outcome: Not Progressing ?  ?Problem: Education: ?Goal: Utilization of techniques to improve thought processes will improve ?Outcome: Not Progressing ?Goal: Knowledge of the prescribed therapeutic regimen will improve ?Outcome: Not Progressing ?  ?Problem: Activity: ?Goal: Interest or engagement in leisure activities will improve ?Outcome: Not Progressing ?Goal: Imbalance in normal sleep/wake cycle will improve ?Outcome: Not Progressing ?  ?Problem: Coping: ?Goal: Coping ability will improve ?Outcome: Not Progressing ?Goal: Will verbalize feelings ?Outcome: Not Progressing ?  ?Problem: Health Behavior/Discharge Planning: ?Goal: Ability to make decisions will improve ?Outcome: Not Progressing ?Goal:  Compliance with therapeutic regimen will improve ?Outcome: Not Progressing ?  ?Problem: Role Relationship: ?Goal: Will demonstrate positive changes in social behaviors and relationships ?Outcome: Not Progressing ?  ?Problem: Safety: ?Goal: Ability to disclose and discuss suicidal ideas will improve ?Outcome: Not Progressing ?Goal: Ability to identify and utilize support systems that promote safety will improve ?Outcome: Not Progressing ?  ?Problem: Self-Concept: ?Goal: Will verbalize positive feelings about self ?Outcome: Not Progressing ?Goal: Level of anxiety will decrease ?Outcome: Not Progressing ?  ?Problem: Education: ?Goal: Ability to state activities that reduce stress will improve ?Outcome: Not Progressing ?  ?Problem: Coping: ?Goal: Ability to identify and develop effective coping behavior will improve ?Outcome: Not Progressing ?  ?Problem: Self-Concept: ?Goal: Ability to identify factors that promote anxiety will improve ?Outcome: Not Progressing ?Goal: Level of anxiety will decrease ?Outcome: Not Progressing ?Goal: Ability to modify response to factors that promote anxiety will improve ?Outcome: Not Progressing ?  ?

## 2021-10-29 NOTE — ED Notes (Signed)
ED tech assisted patient to restroom.  ?

## 2021-10-29 NOTE — ED Notes (Signed)
Pt assisted to restroom by ED tech. ?

## 2021-10-29 NOTE — ED Notes (Addendum)
ED tech help assisted patient to restroom. ?

## 2021-10-29 NOTE — Progress Notes (Signed)
Nutrition Brief Note ? ?Patient identified on the Malnutrition Screening Tool (MST) Report ? ?51 y.o. male with history of EtOH abuse who comes in with chest pain and concern for SI. ? ?Wt Readings from Last 15 Encounters:  ?10/29/21 79.8 kg  ?12/20/20 83.9 kg  ?12/19/20 83.9 kg  ?11/01/20 81.6 kg  ?07/31/20 81.6 kg  ?07/19/20 81.6 kg  ?07/16/20 83.9 kg  ?05/18/20 83.9 kg  ?05/10/20 87.2 kg  ?04/23/20 81.6 kg  ?08/20/17 81.2 kg  ?07/31/17 79.4 kg  ?06/17/17 80.7 kg  ?05/01/17 85.3 kg  ?11/10/13 88.5 kg  ? ? ?Body mass index is 25.99 kg/m?Marland Kitchen Patient meets criteria for overweight based on current BMI.  ? ?Current diet order is regular, patient is consuming approximately 100% of meals at this time. Labs and medications reviewed.  ? ?Pt is ordered for MVI, thiamine and folic acid.  ? ?No nutrition interventions warranted at this time. If nutrition issues arise, please consult RD.  ? ?Betsey Holiday MS, RD, LDN ?Please refer to [facility] for RD and/or RD on-call/weekend/after hours pager ? ? ?

## 2021-10-29 NOTE — ED Notes (Signed)
IVC/pending psych consult 

## 2021-10-29 NOTE — Tx Team (Signed)
Initial Treatment Plan ?10/29/2021 ?4:12 PM ?Theola Sequin ?FAO:[id number] ? ? ? ?PATIENT STRESSORS: ?Loss of friend and family members last month   ?Substance abuse   ? ? ?PATIENT STRENGTHS: ?Communication skills  ?Motivation for treatment/growth  ? ? ?PATIENT IDENTIFIED PROBLEMS: ?Ineffective coping skills  ?Alcohol abuse  ?  ?  ?  ?  ?  ?  ?  ?  ? ?DISCHARGE CRITERIA:  ?Improved stabilization in mood, thinking, and/or behavior ?Motivation to continue treatment in a less acute level of care ?Withdrawal symptoms are absent or subacute and managed without 24-hour nursing intervention ? ?PRELIMINARY DISCHARGE PLAN: ?Attend 12-step recovery group ?Outpatient therapy ?Placement in alternative living arrangements ? ?PATIENT/FAMILY INVOLVEMENT: ?This treatment plan has been presented to and reviewed with the patient, Lee Sparks, and/or family member.  The patient and family have been given the opportunity to ask questions and make suggestions. ? ?Sharin Mons, RN ?10/29/2021, 4:12 PM ?

## 2021-10-29 NOTE — Group Note (Signed)
[place] LCSW Group Therapy Note ? ? ?Group Date: 10/29/2021 ?Start Time: 1300 ?End Time: 1400 ? ?Type of Therapy/Topic:  Group Therapy:  Feelings about Diagnosis ? ?Participation Level:  Did Not Attend  ? ?Mood: n/a ? ? ?Description of Group:   ? This group will allow patients to explore their thoughts and feelings about diagnoses they have received. Patients will be guided to explore their level of understanding and acceptance of these diagnoses. Facilitator will encourage patients to process their thoughts and feelings about the reactions of others to their diagnosis, and will guide patients in identifying ways to discuss their diagnosis with significant others in their lives. This group will be process-oriented, with patients participating in exploration of their own experiences as well as giving and receiving support and challenge from other group members. ? ? ?Therapeutic Goals: ?1. Patient will demonstrate understanding of diagnosis as evidence by identifying two or more symptoms of the disorder:  ?2. Patient will be able to express two feelings regarding the diagnosis ?3. Patient will demonstrate ability to communicate their needs through discussion and/or role plays ? ?Summary of Patient Progress: ?Patient not yet admitted to unit at time of group.  ? ? ? ?Therapeutic Modalities:   ?Cognitive Behavioral Therapy ?Brief Therapy ?Feelings Identification  ? ? ?Durenda Hurt, LCSWA ?

## 2021-10-29 NOTE — Progress Notes (Signed)
Pt reports last month he lost he cousin, step-dad, and close friend who all passed very close to one another. Pt report this was his breaking point that sent him into drinking so heavily. Pt reports normal he drinks from time to time but not daily and as heavily. Pt shares he is homeless, is unemployed, and has no insurance.Pt states he was staying in at a hotel but will/has run out of money to be able to stay in longer. Pt would like rehab and [facility] about [facility]. Pt endorse high anxiety/depression, is SI w/o a plan while here and is experiencing VH that consist of flickers all over the place. Skin assessment preformed upon admission. Skin appears intact and free of injury.  ?

## 2021-10-29 NOTE — BH Assessment (Signed)
Patient is to be admitted to [facility] today 10/29/21 by Dr. Toni Amend.  ?Attending Physician will be Dr.  Toni Amend .   ?Patient has been assigned to room 324, by [facility] Charge Nurse, Ivonne Andrew.   ? ?ER staff is aware of the admission: ?Misty Stanley, ER Secretary   ?Dr. Darnelle Catalan, ER MD  ?Connye Burkitt, Patient's Nurse  ?Sue Lush, Patient Access.  ?

## 2021-10-29 NOTE — Consult Note (Signed)
[facility] Face-to-Face [facility]iat[facility] Consult  ? ?Reason for Consult:Chest Pain and Suicidal  ?Referring Physician:  Dr. Fuller PlanFunke ?Patient Identification: Lee Sparks ?MRN:  [id number] ?Principal Diagnosis: <principal problem not specified> ?Diagnosis:  Active Problems: ?  Hypertension ?  Severe recurrent major depression without psychotic features (HCC) ?  Severe alcohol use disorder (HCC) ? ? ?Total Time spent with patient: 1 hour ? ?Subjective: "I go into DTs really fast." ?Lee Sparks is a 51 y.o. male patient [facility] with complaints of chest pain and suicidal ideations. The patient shared that he needs detox treatment for alcohol use disorder. The patient BAL 440 mg/dl.  ?This provider saw the patient face-to-face; the chart was reviewed, and consulted with Dr. Marisa SeverinSiadecki on 10/29/2021 due to the patient's care. It was discussed with the EDP that the patient does meet the criteria to be admitted to the psychiatric inpatient unit.  ?On evaluation, the patient is alert and oriented x4, anxious, depressed but cooperative, and mood is congruent with affect. The patient does not appear to be responding to internal or external stimuli. Neither is the patient presenting with any delusional thinking. The patient denies auditory hallucinations but denies visual hallucinations. The patient admits to suicidal ideations without a plan but denies homicidal ideations. The patient is not presenting with any psychotic or paranoid behaviors. During an encounter with the patient, he could answer questions appropriately. ? ?HPI: Per Dr. Fuller PlanFunke, Lee Sparks is a 51 y.o. male with history of EtOH abuse who comes in with chest pain and concern for SI.  Patient was reportedly found on the side of a bridge trying to jump off when somebody found him and brought him into the ER reportedly.  Patient states he does not remember being on a bridge.  He denies any falls or hitting his head.  He reports being here to try to  get off alcohol.  He reports drinking daily and drinking today before coming in.  Does report SI with a plan to cut himself.  He does report some chest pain and shortness of breath when I asked him about that as well.  He reports not eating and drinking food very well the last few days.  ? ?Past Psychiatric History:  ? ? ?Risk to Self:   ?Risk to Others:   ?Prior Inpatient Therapy:   ?Prior Outpatient Therapy:   ? ?Past Medical History:  ?Past Medical History:  ?Diagnosis Date  ? Alcohol-induced pancreatitis   ? Anemia   ? Bronchitis   ?  ?Past Surgical History:  ?Procedure Laterality Date  ? CYSTECTOMY    ? ESOPHAGOGASTRODUODENOSCOPY  07/28/2011  ? Procedure: ESOPHAGOGASTRODUODENOSCOPY (EGD);  Surgeon: Freddy JakschWilliam M Outlaw, MD;  [facility];  Service: Endoscopy;  Laterality: N/A;  ? HERNIA REPAIR    ? ?Family History:  ?Family History  ?Problem Relation Age of Onset  ? Hypertension Father   ? Aneurysm Father   ? Cancer Other   ? Hypertension Mother   ? Pneumonia Mother   ? ?Family Psychiatric  History:  ?Social History:  ?Social History  ? ?Substance and Sexual Activity  ?Alcohol Use Yes  ? Alcohol/week: 50.0 standard drinks  ? Types: 50 Cans of beer per week  ? Comment: Equivalent of two 40 oz beers daily.  ?   ?Social History  ? ?Substance and Sexual Activity  ?Drug Use No  ?  ?Social History  ? ?Socioeconomic History  ? Marital status: Single  ?  Spouse name: Not on file  ? Number of children: Not on file  ? Years of education: Not on file  ? Highest education level: Not on file  ?Occupational History  ? Not on file  ?Tobacco Use  ? Smoking status: Every Day  ?  Packs/day: 0.50  ?  Years: 15.00  ?  Pack years: 7.50  ?  Types: Cigarettes  ?  Last attempt to quit: 06/27/2011  ?  Years since quitting: 10.3  ? Smokeless tobacco: Never  ?Substance and Sexual Activity  ? Alcohol use: Yes  ?  Alcohol/week: 50.0 standard drinks  ?  Types: 50 Cans of beer per week  ?  Comment: Equivalent of two 40 oz beers daily.   ? Drug use: No  ? Sexual activity: Yes  ?Other Topics Concern  ? Not on file  ?Social History Narrative  ? Not on file  ? ?Social Determinants of Health  ? ?Financial Resource Strain: Not on file  ?Food Insecurity: Not on file  ?Transportation Needs: Not on file  ?Physical Activity: Not on file  ?Stress: Not on file  ?Social Connections: Not on file  ? ?Additional Social History: ?  ? ?Allergies:   ?Allergies  ?Allergen Reactions  ? Grapefruit Concentrate Hives  ? Grapefruit Diet Or [Extra Strength Grapefruit] Hives  ?  Pt states he has a reaction to grapefruit.   ? ? ?Labs:  ?Results for orders placed or performed during the hospital encounter of 10/28/21 (from the past 48 hour(s))  ?Comprehensive metabolic panel     Status: Abnormal  ? Collection Time: 10/28/21  9:43 PM  ?Result Value Ref Range  ? Sodium 131 (L) 135 - 145 mmol/L  ? Potassium 3.7 3.5 - 5.1 mmol/L  ? Chloride 96 (L) 98 - 111 mmol/L  ? CO2 21 (L) 22 - 32 mmol/L  ? Glucose, Bld 148 (H) 70 - 99 mg/dL  ?  Comment: Glucose reference range applies only to samples taken after fasting for at least 8 hours.  ? BUN 8 6 - 20 mg/dL  ? Creatinine, Ser 0.84 0.61 - 1.24 mg/dL  ? Calcium 8.6 (L) 8.9 - 10.3 mg/dL  ? Total Protein 7.9 6.5 - 8.1 g/dL  ? Albumin 4.1 3.5 - 5.0 g/dL  ? AST 106 (H) 15 - 41 U/L  ? ALT 55 (H) 0 - 44 U/L  ? Alkaline Phosphatase 60 38 - 126 U/L  ? Total Bilirubin 0.7 0.3 - 1.2 mg/dL  ? GFR, Estimated >60 >60 mL/min  ?  Comment: (NOTE) ?Calculated using the CKD-EPI Creatinine Equation (2021) ?  ? Anion gap 14 5 - 15  ?  Comment: Performed at [facility], [address], [place], [place] [address]  ?Ethanol     Status: Abnormal  ? Collection Time: 10/28/21  9:43 PM  ?Result Value Ref Range  ? Alcohol, Ethyl (B) 440 (HH) <10 mg/dL  ?  Comment: CRITICAL RESULT CALLED TO, READ BACK BY AND VERIFIED WITH ?DORIAN JAMES @2215  ON 10/28/21 SKL ?(NOTE) ?Lowest detectable limit for serum alcohol is 10 mg/dL. ? ?For medical purposes  only. ?Performed at [facility], [address] [place], ?[place] ?  ?Salicylate level     Status: Abnormal  ? Collection Time: 10/28/21  9:43 PM  ?Result Value Ref Range  ? Salicylate Lvl <7.0 (L) 7.0 - 30.0 mg/dL  ?  Comment: Performed at [facility], [address] [place], [place]  ?Acetaminophen level  Status: None  ? Collection Time: 10/28/21  9:43 PM  ?Result Value Ref Range  ? Acetaminophen (Tylenol), Serum 14 10 - 30 ug/mL  ?  Comment: (NOTE) ?Therapeutic concentrations vary significantly. A range of 10-30 ug/mL  ?may be an effective concentration for many patients. However, some  ?are best treated at concentrations outside of this range. ?Acetaminophen concentrations >150 ug/mL at 4 hours after ingestion  ?and >50 ug/mL at 12 hours after ingestion are often associated with  ?toxic reactions. ? ?Performed at [facility], [address] [place], ?[place] [address] ?  ?cbc     Status: None  ? Collection Time: 10/28/21  9:43 PM  ?Result Value Ref Range  ? WBC 8.8 4.0 - 10.5 K/uL  ? RBC 5.29 4.22 - 5.81 MIL/uL  ? Hemoglobin 15.5 13.0 - 17.0 g/dL  ? HCT 44.8 39.0 - 52.0 %  ? MCV 84.7 80.0 - 100.0 fL  ? MCH 29.3 26.0 - 34.0 pg  ? MCHC 34.6 30.0 - 36.0 g/dL  ? RDW 15.4 11.5 - 15.5 %  ? Platelets 158 150 - 400 K/uL  ? nRBC 0.0 0.0 - 0.2 %  ?  Comment: Performed at [facility], [address] [place], [place] [address]  ?Urine Drug Screen, Qualitative     Status: None  ? Collection Time: 10/28/21  9:43 PM  ?Result Value Ref Range  ? Tricyclic, Ur Screen NONE DETECTED NONE DETECTED  ? Amphetamines, Ur Screen NONE DETECTED NONE DETECTED  ? MDMA (Ecstasy)Ur Screen NONE DETECTED NONE DETECTED  ? Cocaine Metabolite,Ur [place] NONE DETECTED NONE DETECTED  ? Opiate, Ur Screen NONE DETECTED NONE DETECTED  ? Phencyclidine (PCP) Ur S NONE DETECTED NONE DETECTED  ? Cannabinoid 50 Ng, Ur [place] NONE DETECTED NONE DETECTED  ? Barbiturates, Ur Screen NONE DETECTED NONE  DETECTED  ? Benzodiazepine, Ur Scrn NONE DETECTED NONE DETECTED  ? Methadone Scn, Ur NONE DETECTED NONE DETECTED  ?  Comment: (NOTE) ?Tricyclics + metabolites, urine    Cutoff 1000 ng/mL ?Amphetamines + metaboli

## 2021-10-29 NOTE — ED Notes (Signed)
IVC/recommended psych inpatient admission when medically cleared. ?

## 2021-10-29 NOTE — ED Notes (Signed)
Report to [place], Charity fundraiser. Pt to go to room 324 ?

## 2021-10-29 NOTE — ED Notes (Signed)
Hospital meal provided.  100% consumed, pt tolerated w/o complaints.  Waste discarded appropriately.   

## 2021-10-29 NOTE — ED Notes (Signed)
Psych NP at bedside

## 2021-10-29 NOTE — BH Assessment (Signed)
Patient is under review for [facility](downstairs) pending BAL results.  ?

## 2021-10-30 DIAGNOSIS — F1010 Alcohol abuse, uncomplicated: Secondary | ICD-10-CM

## 2021-10-30 MED ORDER — MIRTAZAPINE 15 MG PO TABS
30.0000 mg | ORAL_TABLET | Freq: Every day | ORAL | Status: DC
  Filled 2021-10-30: qty 2

## 2021-10-30 MED ORDER — ASPIRIN EC 81 MG PO TBEC
81.0000 mg | DELAYED_RELEASE_TABLET | Freq: Every day | ORAL | Status: DC
  Administered 2021-10-30 – 2021-11-06 (×8): 81 mg via ORAL
  Filled 2021-10-30 (×9): qty 1

## 2021-10-30 MED ORDER — AMLODIPINE BESYLATE 5 MG PO TABS
5.0000 mg | ORAL_TABLET | Freq: Every day | ORAL | Status: DC
  Administered 2021-10-30 – 2021-11-06 (×8): 5 mg via ORAL
  Filled 2021-10-30 (×8): qty 1

## 2021-10-30 NOTE — Group Note (Signed)
[facility] LCSW Group Therapy Note ? ? ?Group Date: 10/30/2021 ?Start Time: 1300 ?End Time: 1400 ? ? ?Type of Therapy/Topic:  Group Therapy:  Emotion Regulation ? ?Participation Level:  Minimal  ? ?Mood: ? ?Description of Group:   ? The purpose of this group is to assist patients in learning to regulate negative emotions and experience positive emotions. Patients will be guided to discuss ways in which they have been vulnerable to their negative emotions. These vulnerabilities will be juxtaposed with experiences of positive emotions or situations, and patients challenged to use positive emotions to combat negative ones. Special emphasis will be placed on coping with negative emotions in conflict situations, and patients will process healthy conflict resolution skills. ? ?Therapeutic Goals: ?Patient will identify two positive emotions or experiences to reflect on in order to balance out negative emotions:  ?Patient will label two or more emotions that they find the most difficult to experience:  ?Patient will be able to demonstrate positive conflict resolution skills through discussion or role plays:  ? ?Summary of Patient Progress: ? ? ?Patient was present for the entirety of the group session. Patient was a minimal listener and partially participated in the topic of discussion. ?Therapeutic Modalities:   ?Cognitive Behavioral Therapy ?Feelings Identification ?Dialectical Behavioral Therapy ? ? ?Rosezella [place], Student-Social Work ?

## 2021-10-30 NOTE — Progress Notes (Signed)

## 2021-10-30 NOTE — Progress Notes (Signed)
Uneventful night, patient still complaints of withdrawal symptoms. Went to bed early, stayed in bed most of shift. Denies SI, HI, AVH. ?Encouragement and support offered, safety checks maintained. Medications given as prescribed. Pt receptive and remains safe on unit with q 15 min checks. ?

## 2021-10-30 NOTE — Plan of Care (Signed)
D: Pt alert and oriented. Pt rates depression 9/10, hopelessness 9/10, and anxiety 9/10. Pt goal: "Getting some rest." Pt reports energy level as low and concentration as being good. Pt reports sleep last night as being poor. Pt did not receive medications for sleep. Pt reports experiencing generalize body pain d/t withdrawals at this time, prn meds given. Pt denies experiencing any HI, or AVH at this time, however endorse SI w/o and verbally contracts for safety.  ? ?Pt's CIWA's have been 12 currently and receiving prn Ativan accordingly. Pt can be observed in the milieu interacting in the dayroom at times. ? ?A: Scheduled medications administered to pt, per MD orders. Support and encouragement provided. Frequent verbal contact made. Routine safety checks conducted q15 minutes.  ? ?R: No adverse drug reactions noted. Pt verbally contracts for safety at this time. Pt compliant with medications and treatment plan. Pt interacts well with others on the unit. Pt remains safe at this time. Will continue to monitor.  ?Problem: Education: ?Goal: Emotional status will improve ?Outcome: Not Progressing ?Goal: Mental status will improve ?Outcome: Not Progressing ?  ?
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
## 2021-10-30 NOTE — H&P (Signed)
Psychiatric Admission Assessment Adult ? ?Patient Identification: Lee Sparks ?MRN:  [id number] ?Date of Evaluation:  10/30/2021 ?Chief Complaint:  Alcohol abuse [F10.10] ?Principal Diagnosis: Alcohol abuse ?Diagnosis:  Principal Problem: ?  Alcohol abuse ?Active Problems: ?  Hypertension ?  Severe recurrent major depression without psychotic features (HCC) ? ?History of Present Illness: Patient seen and chart reviewed.  51 year old man with a history of alcohol abuse and depressive symptoms presented to the emergency room intoxicated complaining of depression and passive suicidality.  Patient states he has been drinking about 15-24 beers a day recently.  Drinking has been very heavy the last couple months.  He [place] in [place] the [place] but [place] here supposedly to live with a woman but when he got here he found she had recently passed away.  This left him homeless.  He has been staying in motels working occasionally but mostly drinking.  Not currently compliant with medicine.  Denies any acute psychosis.  Mild visual hallucinations from alcohol withdrawal today.  Denies using any other drugs. ?Associated Signs/Symptoms: ?Depression Symptoms:  depressed mood, ?anhedonia, ?insomnia, ?fatigue, ?difficulty concentrating, ?suicidal thoughts without plan, ?Duration of Depression Symptoms: Greater than two weeks ? ?(Hypo) Manic Symptoms:  Impulsivity, ?Anxiety Symptoms:  Excessive Worry, ?Psychotic Symptoms:   Denies any ?PTSD Symptoms: ?Negative ?Total Time spent with patient: 1 hour ? ?Past Psychiatric History: Patient has a long history of alcohol abuse.  He tells [place] that he has had DTs in the past but looking at the chart I do not see that he has had true delirium with withdrawal but gets pretty shaky.  No past suicide attempts.  When asked about any recent suicidal behavior he would just say drinking too much.  Has been on antidepressants in the past not sure if he has really been compliant  very much outpatient.  Certainly has not recently been compliant with any treatment either here or back in [place]. ? ?Is the patient at risk to self? Yes.    ?Has the patient been a risk to self in the past 6 months? Yes.    ?Has the patient been a risk to self within the distant past? Yes.    ?Is the patient a risk to others? No.  ?Has the patient been a risk to others in the past 6 months? No.  ?Has the patient been a risk to others within the distant past? No.  ? ?Prior Inpatient Therapy:   ?Prior Outpatient Therapy:   ? ?Alcohol Screening: 1. How often do you have a drink containing alcohol?: 4 or more times a week ?2. How many drinks containing alcohol do you have on a typical day when you are drinking?: 10 or more ?3. How often do you have six or more drinks on one occasion?: Daily or almost daily ?AUDIT-C Score: 12 ?4. How often during the last year have you found that you were not able to stop drinking once you had started?: Less than monthly ?5. How often during the last year have you failed to do what was normally expected from you because of drinking?: Less than monthly ?6. How often during the last year have you needed a first drink in the morning to get yourself going after a heavy drinking session?: Less than monthly ?7. How often during the last year have you had a feeling of guilt of remorse after drinking?: Less than monthly ?8. How often during the last year have you been unable to remember what happened  the night before because you had been drinking?: Never ?9. Have you or someone else been injured as a result of your drinking?: No ?10. Has a relative or friend or a doctor or another health worker been concerned about your drinking or suggested you cut down?: No ?Alcohol Use Disorder Identification Test Final Score (AUDIT): 16 ?Alcohol Brief Interventions/Follow-up: Alcohol education/Brief advice ?Substance Abuse History in the last 12 months:  Yes.   ?Consequences of Substance Abuse: ?Severe  compromise of functioning and mood ?Previous Psychotropic Medications: Yes  ?Psychological Evaluations: Yes  ?Past Medical History:  ?Past Medical History:  ?Diagnosis Date  ? Alcohol-induced pancreatitis   ? Anemia   ? Asthma   ? Bronchitis   ?  ?Past Surgical History:  ?Procedure Laterality Date  ? CYSTECTOMY    ? ESOPHAGOGASTRODUODENOSCOPY  07/28/2011  ? Procedure: ESOPHAGOGASTRODUODENOSCOPY (EGD);  Surgeon: Freddy Jaksch, MD;  Location: [facility] ENDOSCOPY;  Service: Endoscopy;  Laterality: N/A;  ? HERNIA REPAIR    ? ?Family History:  ?Family History  ?Problem Relation Age of Onset  ? Hypertension Father   ? Aneurysm Father   ? Cancer Other   ? Hypertension Mother   ? Pneumonia Mother   ? ?Family Psychiatric  History: Multiple people in the family with alcohol abuse ?Tobacco Screening:   ?Social History:  ?Social History  ? ?Substance and Sexual Activity  ?Alcohol Use Yes  ? Alcohol/week: 50.0 standard drinks  ? Types: 50 Cans of beer per week  ? Comment: Equivalent of two 40 oz beers daily.  ?   ?Social History  ? ?Substance and Sexual Activity  ?Drug Use No  ?  ?Additional Social History: ?  ?   ?  ?  ?  ?  ?  ?  ?  ?  ?  ?  ? ?Allergies:   ?Allergies  ?Allergen Reactions  ? Grapefruit Concentrate Hives  ? Grapefruit Diet Or [Extra Strength Grapefruit] Hives  ?  Pt states he has a reaction to grapefruit.   ? ?Lab Results:  ?Results for orders placed or performed during the hospital encounter of 10/28/21 (from the past 48 hour(s))  ?Comprehensive metabolic panel     Status: Abnormal  ? Collection Time: 10/28/21  9:43 PM  ?Result Value Ref Range  ? Sodium 131 (L) 135 - 145 mmol/L  ? Potassium 3.7 3.5 - 5.1 mmol/L  ? Chloride 96 (L) 98 - 111 mmol/L  ? CO2 21 (L) 22 - 32 mmol/L  ? Glucose, Bld 148 (H) 70 - 99 mg/dL  ?  Comment: Glucose reference range applies only to samples taken after fasting for at least 8 hours.  ? BUN 8 6 - 20 mg/dL  ? Creatinine, Ser 0.84 0.61 - 1.24 mg/dL  ? Calcium 8.6 (L) 8.9 - 10.3 mg/dL  ?  Total Protein 7.9 6.5 - 8.1 g/dL  ? Albumin 4.1 3.5 - 5.0 g/dL  ? AST 106 (H) 15 - 41 U/L  ? ALT 55 (H) 0 - 44 U/L  ? Alkaline Phosphatase 60 38 - 126 U/L  ? Total Bilirubin 0.7 0.3 - 1.2 mg/dL  ? GFR, Estimated >60 >60 mL/min  ?  Comment: (NOTE) ?Calculated using the CKD-EPI Creatinine Equation (2021) ?  ? Anion gap 14 5 - 15  ?  Comment: Performed at [facility], [address], [place], [place] [address]  ?Ethanol     Status: Abnormal  ? Collection Time: 10/28/21  9:43 PM  ?Result Value Ref Range  ?  Alcohol, Ethyl (B) 440 (HH) <10 mg/dL  ?  Comment: CRITICAL RESULT CALLED TO, READ BACK BY AND VERIFIED WITH ?DORIAN JAMES @2215  ON 10/28/21 SKL ?(NOTE) ?Lowest detectable limit for serum alcohol is 10 mg/dL. ? ?For medical purposes only. ?Performed at [facility], [address] [place], ?[place] [address] ?  ?Salicylate level     Status: Abnormal  ? Collection Time: 10/28/21  9:43 PM  ?Result Value Ref Range  ? Salicylate Lvl <7.0 (L) 7.0 - 30.0 mg/dL  ?  Comment: Performed at [facility], [address] [place], [place] [address]  ?Acetaminophen level     Status: None  ? Collection Time: 10/28/21  9:43 PM  ?Result Value Ref Range  ? Acetaminophen (Tylenol), Serum 14 10 - 30 ug/mL  ?  Comment: (NOTE) ?Therapeutic concentrations vary significantly. A range of 10-30 ug/mL  ?may be an effective concentration for many patients. However, some  ?are best treated at concentrations outside of this range. ?Acetaminophen concentrations >150 ug/mL at 4 hours after ingestion  ?and >50 ug/mL at 12 hours after ingestion are often associated with  ?toxic reactions. ? ?Performed at [facility], [address] [place], ?[place] [address] ?  ?cbc     Status: None  ? Collection Time: 10/28/21  9:43 PM  ?Result Value Ref Range  ? WBC 8.8 4.0 - 10.5 K/uL  ? RBC 5.29 4.22 - 5.81 MIL/uL  ? Hemoglobin 15.5 13.0 - 17.0 g/dL  ? HCT 44.8 39.0 - 52.0 %  ? MCV 84.7 80.0 - 100.0 fL  ? MCH 29.3 26.0 -  34.0 pg  ? MCHC 34.6 30.0 - 36.0 g/dL  ? RDW 15.4 11.5 - 15.5 %  ? Platelets 158 150 - 400 K/uL  ? nRBC 0.0 0.0 - 0.2 %  ?  Comment: Performed at [facility], [address] [place],

## 2021-10-30 NOTE — BH IP Treatment Plan (Signed)
Interdisciplinary Treatment and Diagnostic Plan Update ? ?10/30/2021 ?Time of Session: 1000 ?Lee Sparks ?MRN: [id number] ? ?Principal Diagnosis: Alcohol abuse ? ?Secondary Diagnoses: Principal Problem: ?  Alcohol abuse ? ? ?Current Medications:  ?Current Facility-Administered Medications  ?Medication Dose Route Frequency Provider Last Rate Last Admin  ? acetaminophen (TYLENOL) tablet 650 mg  650 mg Oral Q6H PRN [name], [name], MD      ? alum & mag hydroxide-simeth (MAALOX/MYLANTA) 200-200-20 MG/5ML suspension 30 mL  30 mL Oral Q4H PRN [name], [name], MD      ? folic acid (FOLVITE) tablet 1 mg  1 mg Oral Daily [name], [name], MD   1 mg at 10/30/21 0850  ? LORazepam (ATIVAN) tablet 1-4 mg  1-4 mg Oral Q1H PRN [name], [name], MD   2 mg at 10/30/21 0956  ? Or  ? LORazepam (ATIVAN) injection 1-4 mg  1-4 mg Intravenous Q1H PRN [name], [name], MD      ? magnesium hydroxide (MILK OF MAGNESIA) suspension 30 mL  30 mL Oral Daily PRN [name], [name], MD      ? multivitamin with minerals tablet 1 tablet  1 tablet Oral Daily [name], [name], MD   1 tablet at 10/30/21 0850  ? nicotine (NICODERM CQ - dosed in mg/24 hours) patch 14 mg  14 mg Transdermal Daily [name], [name], MD   14 mg at 10/30/21 0855  ? pneumococcal 20-valent conjugate vaccine (PREVNAR 20) injection 0.5 mL  0.5 mL Intramuscular Tomorrow-1000 [name], [name], MD      ? thiamine tablet 100 mg  100 mg Oral Daily [name], [name], MD   100 mg at 10/30/21 0850  ? Or  ? thiamine (B-1) injection 100 mg  100 mg Intravenous Daily [name], [name], MD      ? ?PTA Medications: ?Medications Prior to Admission  ?Medication Sig Dispense Refill Last Dose  ? amLODipine (NORVASC) 5 MG tablet Take 1 tablet (5 mg total) by mouth daily. (Patient not taking: Reported on 10/29/2021) 30 tablet 1   ? aspirin EC 81 MG tablet Take 81 mg by mouth daily. Swallow whole.     ? buPROPion (WELLBUTRIN XL) 150 MG 24 hr tablet Take 1 tablet (150 mg total) by mouth daily. (Patient not taking:  Reported on 10/29/2021) 30 tablet 1   ? hydrOXYzine (ATARAX/VISTARIL) 50 MG tablet Take 1 tablet (50 mg total) by mouth every 6 (six) hours as needed for anxiety (sleep). (Patient not taking: Reported on 10/29/2021) 90 tablet 1   ? mirtazapine (REMERON) 45 MG tablet Take 1 tablet (45 mg total) by mouth at bedtime. (Patient not taking: Reported on 10/29/2021) 30 tablet 1   ? naltrexone (DEPADE) 50 MG tablet Take 1 tablet (50 mg total) by mouth daily. (Patient not taking: Reported on 10/29/2021) 30 tablet 1   ? thiamine (VITAMIN B-1) 100 MG tablet Take 1 tablet (100 mg total) by mouth daily. (Patient not taking: Reported on 10/29/2021) 30 tablet 1   ? ? ?Patient Stressors: Loss of friend and family members last month   ?Substance abuse   ? ?Patient Strengths: Communication skills  ?Motivation for treatment/growth  ? ?Treatment Modalities: Medication Management, Group therapy, Case management,  ?1 to 1 session with clinician, Psychoeducation, Recreational therapy. ? ? ?Physician Treatment Plan for Primary Diagnosis: Alcohol abuse ?Long Term Goal(s):    ? ?Short Term Goals:   ? ?Medication Management: Evaluate patient's response, side effects, and tolerance of medication regimen. ? ?  Therapeutic Interventions: 1 to 1 sessions, Unit Group sessions and Medication administration. ? ?Evaluation of Outcomes: Progressing ? ?Physician Treatment Plan for Secondary Diagnosis: Principal Problem: ?  Alcohol abuse ? ?Long Term Goal(s):    ? ?Short Term Goals:      ? ?Medication Management: Evaluate patient's response, side effects, and tolerance of medication regimen. ? ?Therapeutic Interventions: 1 to 1 sessions, Unit Group sessions and Medication administration. ? ?Evaluation of Outcomes: Progressing ? ? ?RN Treatment Plan for Primary Diagnosis: Alcohol abuse ?Long Term Goal(s): Knowledge of disease and therapeutic regimen to maintain health will improve ? ?Short Term Goals: Ability to remain free from injury will improve, Ability to  verbalize frustration and anger appropriately will improve, Ability to demonstrate self-control, Ability to participate in decision making will improve, Ability to verbalize feelings will improve, Ability to disclose and discuss suicidal ideas, Ability to identify and develop effective coping behaviors will improve, and Compliance with prescribed medications will improve ? ?Medication Management: RN will administer medications as ordered by provider, will assess and evaluate patient's response and provide education to patient for prescribed medication. RN will report any adverse and/or side effects to prescribing provider. ? ?Therapeutic Interventions: 1 on 1 counseling sessions, Psychoeducation, Medication administration, Evaluate responses to treatment, Monitor vital signs and CBGs as ordered, Perform/monitor CIWA, COWS, AIMS and Fall Risk screenings as ordered, Perform wound care treatments as ordered. ? ?Evaluation of Outcomes: Progressing ? ? ?LCSW Treatment Plan for Primary Diagnosis: Alcohol abuse ?Long Term Goal(s): Safe transition to appropriate next level of care at discharge, Engage patient in therapeutic group addressing interpersonal concerns. ? ?Short Term Goals: Engage patient in aftercare planning with referrals and resources, Increase social support, Increase ability to appropriately verbalize feelings, Increase emotional regulation, Facilitate acceptance of mental health diagnosis and concerns, Facilitate patient progression through stages of change regarding substance use diagnoses and concerns, Identify triggers associated with mental health/substance abuse issues, and Increase skills for wellness and recovery ? ?Therapeutic Interventions: Assess for all discharge needs, 1 to 1 time with Education officer, museum, Explore available resources and support systems, Assess for adequacy in community support network, Educate family and significant other(s) on suicide prevention, Complete Psychosocial Assessment,  Interpersonal group therapy. ? ?Evaluation of Outcomes: Progressing ? ? ?Progress in Treatment: ?Attending groups: No. ?Participating in groups: No. ?Taking medication as prescribed: Yes. ?Toleration medication: Yes. ?Family/Significant other contact made: No, will contact:  CSW will obtain consent to reach collateral contact.  ?Patient understands diagnosis: Yes. ?Discussing patient identified problems/goals with staff: Yes. ?Medical problems stabilized or resolved: No. Patient continues to detox from Alcohol, currently on CIWA protocol.  ?Denies suicidal/homicidal ideation: No. ?Issues/concerns per patient self-inventory: Yes. ?Other: none ? ?New problem(s) identified: Yes, Describe:  Patient states he is currently homeless, was planning on splitting rent with a friend. However, the friend recently died and he can not afford to pay rent alone.   ? ?New Short Term/Long Term Goal(s): Patient to work towards detox, , medication management for mood stabilization; elimination of SI thoughts; development of comprehensive mental wellness/sobriety plan. ? ?Patient Goals:  States, "get my head back right and get rid of the shakes (alcohol withdrawal). I was really bad this time . . . Not to have suicidal ideation."   ? ?Discharge Plan or Barriers: Patient is currently homeless, CSW will discuss housing options with patient.  ? ?Reason for Continuation of Hospitalization: Depression ?Suicidal ideation ?Other; describe significant alcohol use.  ? ?Estimated Length of Stay: 1-7 days  ? ? ?  Scribe for Treatment Team: ?Larose Kells ?10/30/2021 ?10:08 AM ?

## 2021-10-30 NOTE — Plan of Care (Signed)
  Problem: Education: Goal: Knowledge of [facility] Education information/materials will improve Outcome: Progressing Goal: Emotional status will improve Outcome: Progressing Goal: Mental status will improve Outcome: Progressing   

## 2021-10-31 DIAGNOSIS — F1010 Alcohol abuse, uncomplicated: Secondary | ICD-10-CM | POA: Diagnosis not present

## 2021-10-31 MED ORDER — NICOTINE POLACRILEX 2 MG MT GUM
2.0000 mg | CHEWING_GUM | OROMUCOSAL | Status: DC | PRN
  Administered 2021-10-31 – 2021-11-04 (×2): 2 mg via ORAL
  Filled 2021-10-31 (×3): qty 1

## 2021-10-31 MED ORDER — QUETIAPINE FUMARATE 25 MG PO TABS
50.0000 mg | ORAL_TABLET | Freq: Every evening | ORAL | Status: DC | PRN
  Administered 2021-10-31: 50 mg via ORAL
  Filled 2021-10-31: qty 2

## 2021-10-31 MED ORDER — IBUPROFEN 600 MG PO TABS
600.0000 mg | ORAL_TABLET | Freq: Four times a day (QID) | ORAL | Status: DC | PRN
Start: 2021-10-31 — End: 2021-11-06
  Administered 2021-10-31 – 2021-11-05 (×4): 600 mg via ORAL
  Filled 2021-10-31 (×4): qty 1

## 2021-10-31 MED ORDER — LOPERAMIDE HCL 2 MG PO CAPS
2.0000 mg | ORAL_CAPSULE | ORAL | Status: DC | PRN
  Administered 2021-10-31: 2 mg via ORAL
  Filled 2021-10-31: qty 1

## 2021-10-31 NOTE — Progress Notes (Signed)
Recreation Therapy Notes ? ?Date: 10/31/2021 ? ?Time: 9:15 AM ?  ?Location: Craft room   ? ?Behavioral response: N/A ?  ?Intervention Topic: Animal Assisted therapy   ? ?Discussion/Intervention: ?Patient refused to attend group. ?  ?Clinical Observations/Feedback:  ?Patient refused to attend group. ?  ?Lee Sparks LRT/CTRS ? ? ? ? ? ? ? ?Lee Sparks ?10/31/2021 12:31 PM ?

## 2021-10-31 NOTE — Progress Notes (Signed)
Patient reported to this writer, during morning med pass, that he does not want the Pneumonia vaccine.   ?

## 2021-10-31 NOTE — Progress Notes (Signed)
[facility] LCSW Progress Note ?  ?10/31/2021    10:15 AM ? ?Adriana Simas Gras  ? ?[id number]  ? ?Type of Contact and Topic: Care Coordination  ? ?CSW met with patient at bedside, provided him with directory of substance use residential treatment facilities and encouraged him to begin calling to complete admissions interview. CSW will follow up at a later time. Situation ongoing, CSW will continue to monitor and update note as more information becomes available.  ? ?  ?Signed:  ?Durenda Hurt, MSW, LCSWA, LCAS ?10/31/2021 10:15 AM ?  ?  ?

## 2021-10-31 NOTE — Progress Notes (Signed)
Patient came to the nurses station asking for something for "the shakes" and diarrhea. He also asked for Nicorette gum. This Clinical research associate gave patient PRN Imodium, Ativan and Nicorette gum. Will continue to monitor patient. ?

## 2021-10-31 NOTE — Progress Notes (Signed)
[facility] MD Progress Note ? ?10/31/2021 4:34 PM ?Lee Sparks  ?MRN:  [id number] ?Subjective: Follow-up for this 51 year old man with a history of longstanding alcohol abuse.  Patient is still complaining of symptoms of withdrawal.  Feels sick to his stomach and is having diarrhea.  Feels shaky much of the time.  Mood dysphoric but no suicidal ideation. ?Principal Problem: Alcohol abuse ?Diagnosis: Principal Problem: ?  Alcohol abuse ?Active Problems: ?  Hypertension ?  Severe recurrent major depression without psychotic features (HCC) ? ?Total Time spent with patient: 30 minutes ? ?Past Psychiatric History: Past history of longstanding severe alcohol abuse with withdrawal problems in the past ? ?Past Medical History:  ?Past Medical History:  ?Diagnosis Date  ? Alcohol-induced pancreatitis   ? Anemia   ? Asthma   ? Bronchitis   ?  ?Past Surgical History:  ?Procedure Laterality Date  ? CYSTECTOMY    ? ESOPHAGOGASTRODUODENOSCOPY  07/28/2011  ? Procedure: ESOPHAGOGASTRODUODENOSCOPY (EGD);  Surgeon: Freddy Jaksch, MD;  Location: [facility] ENDOSCOPY;  Service: Endoscopy;  Laterality: N/A;  ? HERNIA REPAIR    ? ?Family History:  ?Family History  ?Problem Relation Age of Onset  ? Hypertension Father   ? Aneurysm Father   ? Cancer Other   ? Hypertension Mother   ? Pneumonia Mother   ? ?Family Psychiatric  History: See previous ?Social History:  ?Social History  ? ?Substance and Sexual Activity  ?Alcohol Use Yes  ? Alcohol/week: 50.0 standard drinks  ? Types: 50 Cans of beer per week  ? Comment: Equivalent of two 40 oz beers daily.  ?   ?Social History  ? ?Substance and Sexual Activity  ?Drug Use No  ?  ?Social History  ? ?Socioeconomic History  ? Marital status: Single  ?  Spouse name: Not on file  ? Number of children: Not on file  ? Years of education: Not on file  ? Highest education level: Not on file  ?Occupational History  ? Not on file  ?Tobacco Use  ? Smoking status: Every Day  ?  Packs/day: 0.50  ?  Years: 15.00  ?  Pack  years: 7.50  ?  Types: Cigarettes  ?  Last attempt to quit: 06/27/2011  ?  Years since quitting: 10.3  ? Smokeless tobacco: Never  ?Substance and Sexual Activity  ? Alcohol use: Yes  ?  Alcohol/week: 50.0 standard drinks  ?  Types: 50 Cans of beer per week  ?  Comment: Equivalent of two 40 oz beers daily.  ? Drug use: No  ? Sexual activity: Not Currently  ?Other Topics Concern  ? Not on file  ?Social History Narrative  ? Not on file  ? ?Social Determinants of Health  ? ?Financial Resource Strain: Not on file  ?Food Insecurity: Not on file  ?Transportation Needs: Not on file  ?Physical Activity: Not on file  ?Stress: Not on file  ?Social Connections: Not on file  ? ?Additional Social History:  ?  ?  ?  ?  ?  ?  ?  ?  ?  ?  ?  ? ?Sleep: Fair ? ?Appetite:  Fair ? ?Current Medications: ?Current Facility-Administered Medications  ?Medication Dose Route Frequency Provider Last Rate Last Admin  ? acetaminophen (TYLENOL) tablet 650 mg  650 mg Oral Q6H PRN Cayli Escajeda, [name], MD   650 mg at 10/31/21 0813  ? alum & mag hydroxide-simeth (MAALOX/MYLANTA) 200-200-20 MG/5ML suspension 30 mL  30 mL Oral Q4H PRN Tamecka Milham T,  MD      ? amLODipine (NORVASC) tablet 5 mg  5 mg Oral Daily Jayona Mccaig, Jackquline DenmarkJohn T, MD   5 mg at 10/31/21 0813  ? aspirin EC tablet 81 mg  81 mg Oral Daily Babe Anthis, Jackquline DenmarkJohn T, MD   81 mg at 10/31/21 0813  ? folic acid (FOLVITE) tablet 1 mg  1 mg Oral Daily Janyth Riera T, MD   1 mg at 10/31/21 0813  ? ibuprofen (ADVIL) tablet 600 mg  600 mg Oral Q6H PRN Onur Mori, Jackquline DenmarkJohn T, MD      ? loperamide (IMODIUM) capsule 2 mg  2 mg Oral PRN Destony Prevost, Jackquline DenmarkJohn T, MD   2 mg at 10/31/21 1451  ? LORazepam (ATIVAN) tablet 1-4 mg  1-4 mg Oral Q1H PRN Charlee Squibb, Jackquline DenmarkJohn T, MD   1 mg at 10/31/21 1451  ? Or  ? LORazepam (ATIVAN) injection 1-4 mg  1-4 mg Intravenous Q1H PRN Gurjot Brisco T, MD      ? magnesium hydroxide (MILK OF MAGNESIA) suspension 30 mL  30 mL Oral Daily PRN Kayron Hicklin T, MD      ? mirtazapine (REMERON) tablet 30 mg  30 mg Oral  QHS Matti Killingsworth T, MD      ? multivitamin with minerals tablet 1 tablet  1 tablet Oral Daily Marthella Osorno, Jackquline DenmarkJohn T, MD   1 tablet at 10/31/21 0813  ? nicotine (NICODERM CQ - dosed in mg/24 hours) patch 14 mg  14 mg Transdermal Daily Devion Chriscoe, Jackquline DenmarkJohn T, MD   14 mg at 10/30/21 [id number]  ? nicotine polacrilex (NICORETTE) gum 2 mg  2 mg Oral PRN Jakaiya Netherland, Jackquline DenmarkJohn T, MD   2 mg at 10/31/21 1451  ? pneumococcal 20-valent conjugate vaccine (PREVNAR 20) injection 0.5 mL  0.5 mL Intramuscular Tomorrow-1000 Zaela Graley T, MD      ? QUEtiapine (SEROQUEL) tablet 50 mg  50 mg Oral QHS PRN Daouda Lonzo T, MD      ? thiamine tablet 100 mg  100 mg Oral Daily Tomaz Janis, Jackquline DenmarkJohn T, MD   100 mg at 10/31/21 0813  ? Or  ? thiamine (B-1) injection 100 mg  100 mg Intravenous Daily Jaslene Marsteller T, MD      ? ? ?Lab Results: No results found for this or any previous visit (from the past 48 hour(s)). ? ?Blood Alcohol level:  ?Lab Results  ?Component Value Date  ? ETH 108 (H) 10/29/2021  ? ETH 440 (HH) 10/28/2021  ? ? ?Metabolic Disorder Labs: ?Lab Results  ?Component Value Date  ? HGBA1C 5.7 (H) 05/30/2020  ? ?No results found for: PROLACTIN ?Lab Results  ?Component Value Date  ? CHOL 206 (H) 05/30/2020  ? TRIG 187 (H) 05/30/2020  ? HDL 28 (L) 05/30/2020  ? CHOLHDL 7.4 (H) 05/30/2020  ? LDLCALC 144 (H) 05/30/2020  ? LDLCALC 98 06/28/2013  ? ? ?Physical Findings: ?AIMS:  , ,  ,  ,    ?CIWA:  CIWA-Ar Total: 4 ?COWS:    ? ?Musculoskeletal: ?Strength & Muscle Tone: within normal limits ?Gait & Station: normal ?Patient leans: N/A ? ?Psychiatric Specialty Exam: ? ?Presentation  ?General Appearance: Appropriate for Environment ? ?Eye Contact:Good ? ?Speech:Clear and Coherent ? ?Speech Volume:Normal ? ?Handedness:Right ? ? ?Mood and Affect  ?Mood:Depressed; Hopeless ? ?Affect:Depressed; Flat; Tearful ? ? ?Thought Process  ?Thought Processes:Goal Directed ? ?Descriptions of Associations:Intact ? ?Orientation:Full (Time, Place and Person) ? ?Thought  Content:Logical ? ?History of Schizophrenia/Schizoaffective disorder:No ? ?Duration of Psychotic Symptoms:No data recorded ?Hallucinations:No data  recorded ?Ideas of Reference:None ? ?Suicidal Thoughts:No data recorded ?Homicidal Thoughts:No data recorded ? ?Sensorium  ?Memory:Immediate Fair; Recent Fair; Remote Fair ? ?Judgment:Fair ? ?Insight:Fair ? ? ?Executive Functions  ?Concentration:Fair ? ?Attention Span:Fair ? ?Recall:Fair ? ?Fund of Knowledge:Good ? ?Language:Fair ? ? ?Psychomotor Activity  ?Psychomotor Activity:No data recorded ? ?Assets  ?Assets:Communication Skills; Desire for Improvement; Physical Health; Resilience; Social Support ? ? ?Sleep  ?Sleep:No data recorded ? ? ?Physical Exam: ?Physical Exam ?Constitutional:   ?   Appearance: Normal appearance.  ?HENT:  ?   Head: Normocephalic and atraumatic.  ?   Mouth/Throat:  ?   Pharynx: Oropharynx is clear.  ?Eyes:  ?   Pupils: Pupils are equal, round, and reactive to light.  ?Cardiovascular:  ?   Rate and Rhythm: Normal rate and regular rhythm.  ?Pulmonary:  ?   Effort: Pulmonary effort is normal.  ?   Breath sounds: Normal breath sounds.  ?Abdominal:  ?   General: Abdomen is flat.  ?   Palpations: Abdomen is soft.  ?Musculoskeletal:     ?   General: Normal range of motion.  ?Skin: ?   General: Skin is warm and dry.  ?Neurological:  ?   General: No focal deficit present.  ?   Mental Status: He is alert. Mental status is at baseline.  ?Psychiatric:     ?   Attention and Perception: Attention normal.     ?   Mood and Affect: Mood normal. Affect is blunt.     ?   Speech: Speech is delayed.     ?   Behavior: Behavior is slowed.     ?   Thought Content: Thought content normal. Thought content is not delusional. Thought content does not include suicidal ideation.     ?   Cognition and Memory: Memory is impaired.  ? ?Review of Systems  ?Constitutional: Negative.   ?HENT: Negative.    ?Eyes: Negative.   ?Respiratory: Negative.    ?Cardiovascular: Negative.    ?Gastrointestinal: Negative.   ?Musculoskeletal: Negative.   ?Skin: Negative.   ?Neurological: Negative.   ?Psychiatric/Behavioral:  Positive for substance abuse. Negative for suicidal ideas. The patient is nervous/anxious

## 2021-10-31 NOTE — [facility] Suicide Risk Assessment (Signed)
[facility] INPATIENT:  Family/Significant Other Suicide Prevention Education ? ?Suicide Prevention Education:  ?Contact Attempts: [name], pastor, [phone] has been identified by the patient as the family member/significant other with whom the patient will be residing, and identified as the person(s) who will aid the patient in the event of a mental health crisis.  With written consent from the patient, two attempts were made to provide suicide prevention education, prior to and/or following the patient's discharge.  We were unsuccessful in providing suicide prevention education.  A suicide education pamphlet was given to the patient to share with family/significant other. ? ?Date and time of first attempt:10/31/2021 5:08PM ?Date and time of second attempt: Second attempt is needed.  ? ?CSW left HIPAA compliant voicemail.  ? ?Lee Sparks ?10/31/2021, 5:07 PM ?

## 2021-10-31 NOTE — Progress Notes (Signed)
Pleasant and cooperative with care. Refused Mirtazapine, only wanted seroquel. Denies any SI, HI, AVH although has thoughts with no plan, no intent. Patient does not need anything for symptoms of withdrawal, reports most needed morning. ?Encouragement and support provided. Safety checks maintained. Medications given as prescribed. Pt receptive and remains safe on unit with q 15 min checks. ?
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
## 2021-10-31 NOTE — Group Note (Signed)
[facility] LCSW Group Therapy Note ? ? ?Group Date: 10/31/2021 ?Start Time: 1300 ?End Time: 1400 ? ? ?Type of Therapy/Topic:  Group Therapy:  Balance in Life ? ?Participation Level:  Active  ? ?Description of Group:   ? This group will address the concept of balance and how it feels and looks when one is unbalanced. Patients will be encouraged to process areas in their lives that are out of balance, and identify reasons for remaining unbalanced. Facilitators will guide patients utilizing problem- solving interventions to address and correct the stressor making their life unbalanced. Understanding and applying boundaries will be explored and addressed for obtaining  and maintaining a balanced life. Patients will be encouraged to explore ways to assertively make their unbalanced needs known to significant others in their lives, using other group members and facilitator for support and feedback. ? ?Therapeutic Goals: ?Patient will identify two or more emotions or situations they have that consume much of in their lives. ?Patient will identify signs/triggers that life has become out of balance:  ?Patient will identify two ways to set boundaries in order to achieve balance in their lives:  ?Patient will demonstrate ability to communicate their needs through discussion and/or role plays ? ?Summary of Patient Progress: ? ? ? ?Patient was present for the entirety of the group session. Patient was an active listener and participated in the topic of discussion, provided helpful advice to others, and added nuance to topic of conversation. Patient shared he was planning on implementing a schedule after discharge. CSW reinforced patient's post discharge plan and allowed others in the group to provide feedback.  ? ? ? ?Therapeutic Modalities:   ?Cognitive Behavioral Therapy ?Solution-Focused Therapy ?Assertiveness Training ? ? ?Corky Crafts, LCSWA ?

## 2021-10-31 NOTE — Plan of Care (Signed)
  Problem: Education: Goal: Knowledge of [facility] Education information/materials will improve Outcome: Progressing Goal: Emotional status will improve Outcome: Progressing Goal: Mental status will improve Outcome: Progressing Goal: Verbalization of understanding the information provided will improve Outcome: Progressing   

## 2021-10-31 NOTE — Plan of Care (Addendum)
D- Patient alert and oriented. Patient presented in a sad, but pleasant mood on assessment reporting that he slept poorly last night stating that his prescribed sleep medications makes his heart pound. Patient endorsed passive SI, without a plan, however, he did contract for safety with this Clinical research associate. Patient also endorsed depression, anxiety and hopelessness, rating them all a "9/10", stating that "past losses of family members" and "being here" is why he feels this way. Patient denied HI/AVH at the time of assessment. Patient's goal for today is "feeling better", in which he will "get the doctor's to do something for me", in order to achieve his goal. Patient also reported on his self-inventory that he has "to try finding some long term treatment". ? ?A- Scheduled medications administered to patient, per MD orders. Support and encouragement provided.  Routine safety checks conducted every 15 minutes.  Patient informed to notify staff with problems or concerns. ? ?R- No adverse drug reactions noted. Patient contracts for safety at this time. Patient compliant with medications and treatment plan. Patient receptive, calm, and cooperative. Patient interacts well with others on the unit.  Patient remains safe at this time. ? ?Problem: Education: ?Goal: Knowledge of [facility] Education information/materials will improve ?Outcome: Not Progressing ?Goal: Emotional status will improve ?Outcome: Not Progressing ?Goal: Mental status will improve ?Outcome: Not Progressing ?Goal: Verbalization of understanding the information provided will improve ?Outcome: Not Progressing ?  ?Problem: Activity: ?Goal: Interest or engagement in activities will improve ?Outcome: Not Progressing ?Goal: Sleeping patterns will improve ?Outcome: Not Progressing ?  ?Problem: Coping: ?Goal: Ability to verbalize frustrations and anger appropriately will improve ?Outcome: Not Progressing ?Goal: Ability to demonstrate self-control will  improve ?Outcome: Not Progressing ?  ?Problem: Health Behavior/Discharge Planning: ?Goal: Identification of resources available to assist in meeting health care needs will improve ?Outcome: Not Progressing ?Goal: Compliance with treatment plan for underlying cause of condition will improve ?Outcome: Not Progressing ?  ?Problem: Physical Regulation: ?Goal: Ability to maintain clinical measurements within normal limits will improve ?Outcome: Not Progressing ?  ?Problem: Safety: ?Goal: Periods of time without injury will increase ?Outcome: Not Progressing ?  ?Problem: Education: ?Goal: Utilization of techniques to improve thought processes will improve ?Outcome: Not Progressing ?Goal: Knowledge of the prescribed therapeutic regimen will improve ?Outcome: Not Progressing ?  ?Problem: Activity: ?Goal: Interest or engagement in leisure activities will improve ?Outcome: Not Progressing ?Goal: Imbalance in normal sleep/wake cycle will improve ?Outcome: Not Progressing ?  ?Problem: Coping: ?Goal: Coping ability will improve ?Outcome: Not Progressing ?Goal: Will verbalize feelings ?Outcome: Not Progressing ?  ?Problem: Health Behavior/Discharge Planning: ?Goal: Ability to make decisions will improve ?Outcome: Not Progressing ?Goal: Compliance with therapeutic regimen will improve ?Outcome: Not Progressing ?  ?Problem: Role Relationship: ?Goal: Will demonstrate positive changes in social behaviors and relationships ?Outcome: Not Progressing ?  ?Problem: Safety: ?Goal: Ability to disclose and discuss suicidal ideas will improve ?Outcome: Not Progressing ?Goal: Ability to identify and utilize support systems that promote safety will improve ?Outcome: Not Progressing ?  ?Problem: Self-Concept: ?Goal: Will verbalize positive feelings about self ?Outcome: Not Progressing ?Goal: Level of anxiety will decrease ?Outcome: Not Progressing ?  ?Problem: Education: ?Goal: Ability to state activities that reduce stress will  improve ?Outcome: Not Progressing ?  ?Problem: Coping: ?Goal: Ability to identify and develop effective coping behavior will improve ?Outcome: Not Progressing ?  ?Problem: Self-Concept: ?Goal: Ability to identify factors that promote anxiety will improve ?Outcome: Not Progressing ?Goal: Level of anxiety will decrease ?Outcome:  Not Progressing ?Goal: Ability to modify response to factors that promote anxiety will improve ?Outcome: Not Progressing ?  ?

## 2021-11-01 DIAGNOSIS — F1010 Alcohol abuse, uncomplicated: Secondary | ICD-10-CM | POA: Diagnosis not present

## 2021-11-01 MED ORDER — BUPROPION HCL ER (XL) 150 MG PO TB24
150.0000 mg | ORAL_TABLET | Freq: Every day | ORAL | Status: DC
  Administered 2021-11-01 – 2021-11-03 (×3): 150 mg via ORAL
  Filled 2021-11-01 (×3): qty 1

## 2021-11-01 MED ORDER — LORATADINE 10 MG PO TABS
10.0000 mg | ORAL_TABLET | Freq: Every day | ORAL | Status: DC
  Administered 2021-11-01 – 2021-11-06 (×6): 10 mg via ORAL
  Filled 2021-11-01 (×6): qty 1

## 2021-11-01 MED ORDER — QUETIAPINE FUMARATE 100 MG PO TABS
100.0000 mg | ORAL_TABLET | Freq: Every evening | ORAL | Status: DC | PRN
  Administered 2021-11-01: 100 mg via ORAL
  Filled 2021-11-01 (×2): qty 1

## 2021-11-01 NOTE — Progress Notes (Signed)
Recreation Therapy Notes ? ?INPATIENT RECREATION TR PLAN ? ?Patient Details ?Name: Lee Sparks ?MRN: [id number] ?DOB: Apr 27, 1971 ?Today's Date: 11/01/2021 ? ?Rec Therapy Plan ?Is patient appropriate for Therapeutic Recreation?: Yes ?Treatment times per week: at least 3 ?Estimated Length of Stay: 5-7 days ?TR Treatment/Interventions: Group participation (Comment) ? ?Discharge Criteria ?Pt will be discharged from therapy if:: Discharged ?Treatment plan/goals/alternatives discussed and agreed upon by:: Patient/family ? ?Discharge Summary ?  ? ? ?[name] ?11/01/2021, 4:08 PM ?

## 2021-11-01 NOTE — Progress Notes (Signed)
Recreation Therapy Notes ? ? ?Date: 11/01/2021 ? ?Time: 10:40 am   ? ?Location: Craft room   ? ?Behavioral response: N/A ?  ?Intervention Topic: Self-care  ? ?Discussion/Intervention: ?Patient refused to attend group.  ? ?Clinical Observations/Feedback:  ?Patient refused to attend group.  ?  ?Majestic Molony LRT/CTRS ? ? ? ? ? ? ? ? ?Kyndel Egger ?11/01/2021 12:36 PM ?

## 2021-11-01 NOTE — Progress Notes (Signed)
Recreation Therapy Notes ? ?INPATIENT RECREATION THERAPY ASSESSMENT ? ?Patient Details ?Name: Lee Sparks ?MRN: [id number] ?DOB: 12-10-1970 ?Today's Date: 11/01/2021 ?      ?Information Obtained From: ?Patient ? ?Able to Participate in Assessment/Interview: ?Yes ? ?Patient Presentation: ?Responsive ? ?Reason for Admission (Per Patient): ?Active Symptoms, Substance Abuse ? ?Patient Stressors: ?  ? ?Coping Skills:   ?Substance Abuse, Talk ? ?Leisure Interests (2+):  ?Music - Play instrument, Music - Listen, Community - Movies, Individual - TV (Ride scooter) ? ?Frequency of Recreation/Participation: ?Weekly ? ?Awareness of Community Resources:  ?Yes ? ?Community Resources:  ?Movie Theaters ? ?Current Use: ?No ? ?If no, Barriers?: ?Financial, Attitudinal ? ?Expressed Interest in [employer] Information: ?Yes ? ?[place] of Residence:  ? ? ?Patient Main Form of Transportation: ?Therapist, music ? ?Patient Strengths:  ?Hard worker ? ?Patient Identified Areas of Improvement:  ?My drinking ? ?Patient Goal for Hospitalization:  ?Getting on track ? ?Current SI (including self-harm):  ?No ? ?Current HI:  ?No ? ?Current AVH: ?No ? ?Staff Intervention Plan: ?Collaborate with Interdisciplinary Treatment Team, Group Attendance ? ?Consent to Intern Participation: ?N/A ? ?Dennice Tindol ?11/01/2021, 4:06 PM ?

## 2021-11-01 NOTE — Progress Notes (Signed)
[facility] MD Progress Note ? ?11/01/2021 3:34 PM ?Lee Sparks  ?MRN:  [id number] ?Subjective: Patient seen and chart reviewed.  Follow-up for this patient with alcohol abuse and depression.  He seems to finally be getting through his withdrawal.  Still occasionally mildly tachycardic but blood pressure stable.  He says he still feels "jittery" but is doing okay off of detox medicine.  He wants to talk about medicine for depression.  Reviewed his old chart and his last medications of mirtazapine and Wellbutrin.  Patient insists that mirtazapine causes his heart to race which does not sound likely to me but he insists on it and he wants to be back on the Wellbutrin.  I explained that I had not prescribed it before because I did not want him to have a seizure but now that he is done detoxing we can restart it.  Also still having some trouble sleeping.  Also still complaining of allergies and "sinus" ?Principal Problem: Alcohol abuse ?Diagnosis: Principal Problem: ?  Alcohol abuse ?Active Problems: ?  Hypertension ?  Severe recurrent major depression without psychotic features ([place]) ? ?Total Time spent with patient: 30 minutes ? ?Past Psychiatric History: Past history of alcohol abuse multiple admissions for alcohol detox ? ?Past Medical History:  ?Past Medical History:  ?Diagnosis Date  ? Alcohol-induced pancreatitis   ? Anemia   ? Asthma   ? Bronchitis   ?  ?Past Surgical History:  ?Procedure Laterality Date  ? CYSTECTOMY    ? ESOPHAGOGASTRODUODENOSCOPY  07/28/2011  ? Procedure: ESOPHAGOGASTRODUODENOSCOPY (EGD);  Surgeon: Landry Dyke, MD;  Location: [facility] ENDOSCOPY;  Service: Endoscopy;  Laterality: N/A;  ? HERNIA REPAIR    ? ?Family History:  ?Family History  ?Problem Relation Age of Onset  ? Hypertension Father   ? Aneurysm Father   ? Cancer Other   ? Hypertension Mother   ? Pneumonia Mother   ? ?Family Psychiatric  History: See previous.  Multiple people with alcohol problems ?Social History:  ?Social History   ? ?Substance and Sexual Activity  ?Alcohol Use Yes  ? Alcohol/week: 50.0 standard drinks  ? Types: 50 Cans of beer per week  ? Comment: Equivalent of two 40 oz beers daily.  ?   ?Social History  ? ?Substance and Sexual Activity  ?Drug Use No  ?  ?Social History  ? ?Socioeconomic History  ? Marital status: Single  ?  Spouse name: Not on file  ? Number of children: Not on file  ? Years of education: Not on file  ? Highest education level: Not on file  ?Occupational History  ? Not on file  ?Tobacco Use  ? Smoking status: Every Day  ?  Packs/day: 0.50  ?  Years: 15.00  ?  Pack years: 7.50  ?  Types: Cigarettes  ?  Last attempt to quit: 06/27/2011  ?  Years since quitting: 10.3  ? Smokeless tobacco: Never  ?Substance and Sexual Activity  ? Alcohol use: Yes  ?  Alcohol/week: 50.0 standard drinks  ?  Types: 50 Cans of beer per week  ?  Comment: Equivalent of two 40 oz beers daily.  ? Drug use: No  ? Sexual activity: Not Currently  ?Other Topics Concern  ? Not on file  ?Social History Narrative  ? Not on file  ? ?Social Determinants of Health  ? ?Financial Resource Strain: Not on file  ?Food Insecurity: Not on file  ?Transportation Needs: Not on file  ?Physical Activity: Not on file  ?  Stress: Not on file  ?Social Connections: Not on file  ? ?Additional Social History:  ?  ?  ?  ?  ?  ?  ?  ?  ?  ?  ?  ? ?Sleep: Fair ? ?Appetite:  Fair ? ?Current Medications: ?Current Facility-Administered Medications  ?Medication Dose Route Frequency Provider Last Rate Last Admin  ? acetaminophen (TYLENOL) tablet 650 mg  650 mg Oral Q6H PRN Miel Wisener, [name], MD   650 mg at 10/31/21 0813  ? alum & mag hydroxide-simeth (MAALOX/MYLANTA) 200-200-20 MG/5ML suspension 30 mL  30 mL Oral Q4H PRN Rayne Loiseau T, MD      ? amLODipine (NORVASC) tablet 5 mg  5 mg Oral Daily Ruger Saxer, [name], MD   5 mg at 11/01/21 0834  ? aspirin EC tablet 81 mg  81 mg Oral Daily Nateisha Moyd, [name], MD   81 mg at 11/01/21 6384  ? buPROPion (WELLBUTRIN XL) 24 hr tablet 150  mg  150 mg Oral Daily Marsella Suman T, MD      ? folic acid (FOLVITE) tablet 1 mg  1 mg Oral Daily Rashawna Scoles, [name], MD   1 mg at 11/01/21 0834  ? ibuprofen (ADVIL) tablet 600 mg  600 mg Oral Q6H PRN Olanda Downie, [name], MD   600 mg at 11/01/21 0834  ? loperamide (IMODIUM) capsule 2 mg  2 mg Oral PRN Aedin Jeansonne T, MD   2 mg at 10/31/21 1451  ? loratadine (CLARITIN) tablet 10 mg  10 mg Oral Daily Cynthie Garmon T, MD      ? magnesium hydroxide (MILK OF MAGNESIA) suspension 30 mL  30 mL Oral Daily PRN Keiondre Colee, [name], MD      ? multivitamin with minerals tablet 1 tablet  1 tablet Oral Daily Zahriyah Joo, [name], MD   1 tablet at 11/01/21 6659  ? nicotine (NICODERM CQ - dosed in mg/24 hours) patch 14 mg  14 mg Transdermal Daily Chad Tiznado, [name], MD   14 mg at 10/30/21 9357  ? nicotine polacrilex (NICORETTE) gum 2 mg  2 mg Oral PRN Dominion Kathan, [name], MD   2 mg at 10/31/21 1451  ? pneumococcal 20-valent conjugate vaccine (PREVNAR 20) injection 0.5 mL  0.5 mL Intramuscular Tomorrow-1000 Fallan Mccarey T, MD      ? QUEtiapine (SEROQUEL) tablet 100 mg  100 mg Oral QHS PRN Raeli Wiens T, MD      ? thiamine tablet 100 mg  100 mg Oral Daily Amairany Schumpert, [name], MD   100 mg at 11/01/21 0834  ? Or  ? thiamine (B-1) injection 100 mg  100 mg Intravenous Daily Jamilett Ferrante T, MD      ? ? ?Lab Results: No results found for this or any previous visit (from the past 48 hour(s)). ? ?Blood Alcohol level:  ?Lab Results  ?Component Value Date  ? ETH 108 (H) 10/29/2021  ? ETH 440 (HH) 10/28/2021  ? ? ?Metabolic Disorder Labs: ?Lab Results  ?Component Value Date  ? HGBA1C 5.7 (H) 05/30/2020  ? ?No results found for: PROLACTIN ?Lab Results  ?Component Value Date  ? CHOL 206 (H) 05/30/2020  ? TRIG 187 (H) 05/30/2020  ? HDL 28 (L) 05/30/2020  ? CHOLHDL 7.4 (H) 05/30/2020  ? LDLCALC 144 (H) 05/30/2020  ? LDLCALC 98 06/28/2013  ? ? ?Physical Findings: ?AIMS:  , ,  ,  ,    ?CIWA:  CIWA-Ar Total: 4 ?COWS:    ? ?Musculoskeletal: ?Strength &  Muscle Tone: within  normal limits ?Gait & Station: normal ?Patient leans: N/A ? ?Psychiatric Specialty Exam: ? ?Presentation  ?General Appearance: Appropriate for Environment ? ?Eye Contact:Good ? ?Speech:Clear and Coherent ? ?Speech Volume:Normal ? ?Handedness:Right ? ? ?Mood and Affect  ?Mood:Depressed; Hopeless ? ?Affect:Depressed; Flat; Tearful ? ? ?Thought Process  ?Thought Processes:Goal Directed ? ?Descriptions of Associations:Intact ? ?Orientation:Full (Time, Place and Person) ? ?Thought Content:Logical ? ?History of Schizophrenia/Schizoaffective disorder:No ? ?Duration of Psychotic Symptoms:No data recorded ?Hallucinations:No data recorded ?Ideas of Reference:None ? ?Suicidal Thoughts:No data recorded ?Homicidal Thoughts:No data recorded ? ?Sensorium  ?Memory:Immediate Fair; Recent Fair; Remote Fair ? ?Judgment:Fair ? ?Insight:Fair ? ? ?Executive Functions  ?Concentration:Fair ? ?Attention Span:Fair ? ?Recall:Fair ? ?[employer] of [place] ? ?Language:Fair ? ? ?Psychomotor Activity  ?Psychomotor Activity:No data recorded ? ?Assets  ?Assets:Communication Skills; Desire for Improvement; Physical Health; Resilience; Social Support ? ? ?Sleep  ?Sleep:No data recorded ? ? ?Physical Exam: ?Physical Exam ?Vitals and nursing note reviewed.  ?Constitutional:   ?   Appearance: Normal appearance.  ?HENT:  ?   Head: Normocephalic and atraumatic.  ?   Mouth/Throat:  ?   Pharynx: Oropharynx is clear.  ?Eyes:  ?   Pupils: Pupils are equal, round, and reactive to light.  ?Cardiovascular:  ?   Rate and Rhythm: Normal rate and regular rhythm.  ?Pulmonary:  ?   Effort: Pulmonary effort is normal.  ?   Breath sounds: Normal breath sounds.  ?Abdominal:  ?   General: Abdomen is flat.  ?   Palpations: Abdomen is soft.  ?Musculoskeletal:     ?   General: Normal range of motion.  ?Skin: ?   General: Skin is warm and dry.  ?Neurological:  ?   General: No focal deficit present.  ?   Mental Status: He is alert. Mental status is at baseline.   ?Psychiatric:     ?   Attention and Perception: Attention normal.     ?   Mood and Affect: Mood normal.     ?   Speech: Speech normal.     ?   Behavior: Behavior is cooperative.     ?   Thought Content: Thought content

## 2021-11-01 NOTE — Plan of Care (Signed)
D- Patient alert and oriented. Patient presented in a pleasant mood on assessment reporting that he slept poorly last night, although it was "a little better, not like I wanted to". Patient also stated that he feels as if the medication he got last night may not have been enough. Patient continues to endorse hopelessness, depression and anxiety, rating them a "8/10" and "9/10", stating that being in here and thinking about people who have passed away, it's a lot", is why he's feeling this way. Patient reported passive SI, with no plan, stating that these thoughts "come and go". Patient denied HI/AVH at this time. Patient's goal for today is "feeling better", in which he will "sleep and eat well" in order to achieve his goal.  ? ?A- Scheduled medications administered to patient, per MD orders. Support and encouragement provided.  Routine safety checks conducted every 15 minutes.  Patient informed to notify staff with problems or concerns. ? ?R- No adverse drug reactions noted. Patient contracts for safety at this time. Patient compliant with medications and treatment plan. Patient receptive, calm, and cooperative. Patient interacts well with others on the unit.  Patient remains safe at this time. ? ?Problem: Education: ?Goal: Knowledge of [facility] Education information/materials will improve ?Outcome: Not Progressing ?Goal: Emotional status will improve ?Outcome: Not Progressing ?Goal: Mental status will improve ?Outcome: Not Progressing ?Goal: Verbalization of understanding the information provided will improve ?Outcome: Not Progressing ?  ?Problem: Activity: ?Goal: Interest or engagement in activities will improve ?Outcome: Not Progressing ?Goal: Sleeping patterns will improve ?Outcome: Not Progressing ?  ?Problem: Coping: ?Goal: Ability to verbalize frustrations and anger appropriately will improve ?Outcome: Not Progressing ?Goal: Ability to demonstrate self-control will improve ?Outcome: Not  Progressing ?  ?Problem: Health Behavior/Discharge Planning: ?Goal: Identification of resources available to assist in meeting health care needs will improve ?Outcome: Not Progressing ?Goal: Compliance with treatment plan for underlying cause of condition will improve ?Outcome: Not Progressing ?  ?Problem: Physical Regulation: ?Goal: Ability to maintain clinical measurements within normal limits will improve ?Outcome: Not Progressing ?  ?Problem: Safety: ?Goal: Periods of time without injury will increase ?Outcome: Not Progressing ?  ?Problem: Education: ?Goal: Utilization of techniques to improve thought processes will improve ?Outcome: Not Progressing ?Goal: Knowledge of the prescribed therapeutic regimen will improve ?Outcome: Not Progressing ?  ?Problem: Activity: ?Goal: Interest or engagement in leisure activities will improve ?Outcome: Not Progressing ?Goal: Imbalance in normal sleep/wake cycle will improve ?Outcome: Not Progressing ?  ?Problem: Coping: ?Goal: Coping ability will improve ?Outcome: Not Progressing ?Goal: Will verbalize feelings ?Outcome: Not Progressing ?  ?Problem: Health Behavior/Discharge Planning: ?Goal: Ability to make decisions will improve ?Outcome: Not Progressing ?Goal: Compliance with therapeutic regimen will improve ?Outcome: Not Progressing ?  ?Problem: Role Relationship: ?Goal: Will demonstrate positive changes in social behaviors and relationships ?Outcome: Not Progressing ?  ?Problem: Safety: ?Goal: Ability to disclose and discuss suicidal ideas will improve ?Outcome: Not Progressing ?Goal: Ability to identify and utilize support systems that promote safety will improve ?Outcome: Not Progressing ?  ?Problem: Self-Concept: ?Goal: Will verbalize positive feelings about self ?Outcome: Not Progressing ?Goal: Level of anxiety will decrease ?Outcome: Not Progressing ?  ?Problem: Education: ?Goal: Ability to state activities that reduce stress will improve ?Outcome: Not Progressing ?   ?Problem: Coping: ?Goal: Ability to identify and develop effective coping behavior will improve ?Outcome: Not Progressing ?  ?Problem: Self-Concept: ?Goal: Ability to identify factors that promote anxiety will improve ?Outcome: Not Progressing ?Goal: Level of anxiety  will decrease ?Outcome: Not Progressing ?Goal: Ability to modify response to factors that promote anxiety will improve ?Outcome: Not Progressing ?  ?

## 2021-11-01 NOTE — [facility] Suicide Risk Assessment (Signed)
[facility] INPATIENT:  Family/Significant Other Suicide Prevention Education ? ?Suicide Prevention Education:  ?Education Completed; Wallene Huh,  Administrator, Civil Service) has been identified by the patient as the family member/significant other with whom the patient will be residing, and identified as the person(s) who will aid the patient in the event of a mental health crisis (suicidal ideations/suicide attempt).  With written consent from the patient, the family member/significant other has been provided the following suicide prevention education, prior to the and/or following the discharge of the patient. ? ?The suicide prevention education provided includes the following: ?Suicide risk factors ?Suicide prevention and interventions ?National Suicide Hotline telephone number ?[facility] assessment telephone number ?[facility] Emergency Assistance 911 ?[place] and/or Residential Mobile Crisis Unit telephone number ? ?Request made of family/significant other to: ?Remove weapons (e.g., guns, rifles, knives), all items previously/currently identified as safety concern.   ?Remove drugs/medications (over-the-counter, prescriptions, illicit drugs), all items previously/currently identified as a safety concern. ? ?The family member/significant other verbalizes understanding of the suicide prevention education information provided.  The family member/significant other agrees to remove the items of safety concern listed above. ? 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 States he was making suicidal statements prior to his hospitalization. Does not believe he has any firearms, states he has his belongings (clothes, personal items, etc.)  ? ?Lee Sparks ?11/01/2021, 3:33 PM ?

## 2021-11-01 NOTE — Group Note (Signed)
[facility] LCSW Group Therapy Note ? ? ?Group Date: 11/01/2021 ?Start Time: 1300 ?End Time: 1400 ? ?Type of Therapy and Topic:  Group Therapy:  Feelings around Relapse and Recovery ? ?Participation Level:  Active  ? ?Mood: depressed ? ?Description of Group:   ? Patients in this group will discuss emotions they experience before and after a relapse. They will process how experiencing these feelings, or avoidance of experiencing them, relates to having a relapse. Facilitator will guide patients to explore emotions they have related to recovery. Patients will be encouraged to process which emotions are more powerful. They will be guided to discuss the emotional reaction significant others in their lives may have to patients? relapse or recovery. Patients will be assisted in exploring ways to respond to the emotions of others without this contributing to a relapse. ? ?Therapeutic Goals: ?Patient will identify two or more emotions that lead to relapse for them:  ?Patient will identify two emotions that result when they relapse:  ?Patient will identify two emotions related to recovery:  ?Patient will demonstrate ability to communicate their needs through discussion and/or role plays. ? ? ?Summary of Patient Progress: ? ?Patient was present for the entirety of the group session. Patient was an active listener and participated in the topic of discussion, provided helpful advice to others, and added nuance to topic of conversation. Patient shared that he has been in various stages of recovery from alcohol. 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  ? ? ?Therapeutic Modalities:   ?Cognitive Behavioral Therapy ?Solution-Focused Therapy ?Assertiveness Training ?Relapse Prevention Therapy ? ? ?Corky Crafts, LCSWA ?

## 2021-11-02 DIAGNOSIS — F1010 Alcohol abuse, uncomplicated: Secondary | ICD-10-CM | POA: Diagnosis not present

## 2021-11-02 MED ORDER — HYDROXYZINE HCL 50 MG PO TABS
50.0000 mg | ORAL_TABLET | Freq: Four times a day (QID) | ORAL | Status: DC | PRN
  Administered 2021-11-02 – 2021-11-05 (×11): 50 mg via ORAL
  Filled 2021-11-02 (×12): qty 1

## 2021-11-02 MED ORDER — FLUTICASONE PROPIONATE 50 MCG/ACT NA SUSP
2.0000 | Freq: Every day | NASAL | Status: DC
  Administered 2021-11-02 – 2021-11-06 (×2): 2 via NASAL
  Filled 2021-11-02: qty 16

## 2021-11-02 NOTE — Progress Notes (Signed)
[facility] ? ?11/02/2021 1:23 PM ?Lee Sparks  ?MRN:  [id number] ?Subjective:  Lee Sparks is seen on rounds today.  His vital signs have been stable.  He denies any side effects from his medications.  I talked to him about Vistaril for anxiety and Flonase for congestion.  He does not have any other complaints. ? ?Principal Problem: Alcohol abuse ?Diagnosis: Principal Problem: ?  Alcohol abuse ?Active Problems: ?  Hypertension ?  Severe recurrent major depression without psychotic features (HCC) ? ?Total Time spent with patient: 15 minutes ? ?Past Psychiatric History: See H&P ? ?Past Medical History:  ?Past Medical History:  ?Diagnosis Date  ? Alcohol-induced pancreatitis   ? Anemia   ? Asthma   ? Bronchitis   ?  ?Past Surgical History:  ?Procedure Laterality Date  ? CYSTECTOMY    ? ESOPHAGOGASTRODUODENOSCOPY  07/28/2011  ? Procedure: ESOPHAGOGASTRODUODENOSCOPY (EGD);  Surgeon: Freddy JakschWilliam M Outlaw, MD;  [facility] END[facility]COPY;  Service: Endoscopy;  Laterality: N/A;  ? HERNIA REPAIR    ? ?Family History:  ?Family History  ?Problem Relation Age of Onset  ? Hypertension Father   ? Aneurysm Father   ? Cancer Other   ? Hypertension Mother   ? Pneumonia Mother   ? ? ? ? ?Social History:  ?Social History  ? ?Substance and Sexual Activity  ?Alcohol Use Yes  ? Alcohol/week: 50.0 standard drinks  ? Types: 50 Cans of beer per week  ? Comment: Equivalent of two 40 oz beers daily.  ?   ?Social History  ? ?Substance and Sexual Activity  ?Drug Use No  ?  ?Social History  ? ?Socioeconomic History  ? Marital status: Single  ?  Spouse name: Not on file  ? Number of children: Not on file  ? Years of education: Not on file  ? Highest education level: Not on file  ?Occupational History  ? Not on file  ?Tobacco Use  ? Smoking status: Every Day  ?  Packs/day: 0.50  ?  Years: 15.00  ?  Pack years: 7.50  ?  Types: Cigarettes  ?  Last attempt to quit: 06/27/2011  ?  Years since quitting: 10.3  ? Smokeless tobacco: Never  ?Substance and  Sexual Activity  ? Alcohol use: Yes  ?  Alcohol/week: 50.0 standard drinks  ?  Types: 50 Cans of beer per week  ?  Comment: Equivalent of two 40 oz beers daily.  ? Drug use: No  ? Sexual activity: Not Currently  ?Other Topics Concern  ? Not on file  ?Social History Narrative  ? Not on file  ? ?Social Determinants of Health  ? ?Financial Resource Strain: Not on file  ?Food Insecurity: Not on file  ?Transportation Needs: Not on file  ?Physical Activity: Not on file  ?Stress: Not on file  ?Social Connections: Not on file  ? ?Additional Social History:  ?  ?  ?  ?  ?  ?  ?  ?  ?  ?  ?  ? ?Sleep: Good ? ?Appetite:  Good ? ?Current Medications: ?Current Facility-Administered Medications  ?Medication Dose Route Frequency Provider Last Rate Last Admin  ? acetaminophen (TYLENOL) tablet 650 mg  650 mg Oral Q6H PRN [name], Jackquline DenmarkJohn T, MD   650 mg at 10/31/21 0813  ? alum & mag hydroxide-simeth (MAALOX/MYLANTA) 200-200-20 MG/5ML suspension 30 mL  30 mL Oral Q4H PRN [name], [name], MD      ? amLODipine (NORVASC) tablet 5 mg  5  mg Oral Daily [name], [name], MD   5 mg at 11/02/21 0900  ? aspirin EC tablet 81 mg  81 mg Oral Daily [name], [name], MD   81 mg at 11/02/21 0900  ? buPROPion (WELLBUTRIN XL) 24 hr tablet 150 mg  150 mg Oral Daily [name], [name], MD   150 mg at 11/02/21 0900  ? fluticasone (FLONASE) 50 MCG/ACT nasal spray 2 spray  2 spray Each Nare Daily Sarina Ill, DO      ? folic acid (FOLVITE) tablet 1 mg  1 mg Oral Daily [name], [name], MD   1 mg at 11/02/21 0900  ? hydrOXYzine (ATARAX) tablet 50 mg  50 mg Oral Q6H PRN Sarina Ill, DO      ? ibuprofen (ADVIL) tablet 600 mg  600 mg Oral Q6H PRN [name], [name], MD   600 mg at 11/01/21 0834  ? loperamide (IMODIUM) capsule 2 mg  2 mg Oral PRN [name], [name], MD   2 mg at 10/31/21 1451  ? loratadine (CLARITIN) tablet 10 mg  10 mg Oral Daily [name], [name], MD   10 mg at 11/02/21 0900  ? magnesium hydroxide (MILK OF MAGNESIA) suspension 30 mL   30 mL Oral Daily PRN [name], [name], MD      ? multivitamin with minerals tablet 1 tablet  1 tablet Oral Daily [name], [name], MD   1 tablet at 11/02/21 0900  ? nicotine (NICODERM CQ - dosed in mg/24 hours) patch 14 mg  14 mg Transdermal Daily [name], [name], MD   14 mg at 10/30/21 3532  ? nicotine polacrilex (NICORETTE) gum 2 mg  2 mg Oral PRN [name], [name], MD   2 mg at 10/31/21 1451  ? pneumococcal 20-valent conjugate vaccine (PREVNAR 20) injection 0.5 mL  0.5 mL Intramuscular Tomorrow-1000 [name], [name], MD      ? QUEtiapine (SEROQUEL) tablet 100 mg  100 mg Oral QHS PRN [name], [name], MD   100 mg at 11/01/21 2157  ? thiamine tablet 100 mg  100 mg Oral Daily [name], [name], MD   100 mg at 11/02/21 0900  ? Or  ? thiamine (B-1) injection 100 mg  100 mg Intravenous Daily [name], [name], MD      ? ? ?Lab Results: No results found for this or any previous visit (from the past 48 hour(s)). ? ?Blood Alcohol level:  ?Lab Results  ?Component Value Date  ? ETH 108 (H) 10/29/2021  ? ETH 440 (HH) 10/28/2021  ? ? ?Metabolic Disorder Labs: ?Lab Results  ?Component Value Date  ? HGBA1C 5.7 (H) 05/30/2020  ? ?No results found for: PROLACTIN ?Lab Results  ?Component Value Date  ? CHOL 206 (H) 05/30/2020  ? TRIG 187 (H) 05/30/2020  ? HDL 28 (L) 05/30/2020  ? CHOLHDL 7.4 (H) 05/30/2020  ? LDLCALC 144 (H) 05/30/2020  ? LDLCALC 98 06/28/2013  ? ? ?Physical Findings: ?AIMS:  , ,  ,  ,    ?CIWA:  CIWA-Ar Total: 4 ?COWS:    ? ?Musculoskeletal: ?Strength & Muscle Tone: within normal limits ?Gait & Station: normal ?Patient leans: N/A ? ?Psychiatric Specialty Exam: ? ?Presentation  ?General Appearance: Appropriate for Environment ? ?Eye Contact:Good ? ?Speech:Clear and Coherent ? ?Speech Volume:Normal ? ?Handedness:Right ? ? ?Mood and Affect  ?Mood:Depressed; Hopeless ? ?Affect:Depressed; Flat; Tearful ? ? ?Thought Process  ?Thought Processes:Goal Directed ? ?Descriptions of Associations:Intact ? ?Orientation:Full (Time, Place  and  Person) ? ?Thought Content:Logical ? ?History of Schizophrenia/Schizoaffective disorder:No ? ?Duration of Psychotic Symptoms:No data recorded ?Hallucinations:No data recorded ?Ideas of Reference:None ? ?Suicidal Thoughts:No data recorded ?Homicidal Thoughts:No data recorded ? ?Sensorium  ?Memory:Immediate Fair; Recent Fair; Remote Fair ? ?Judgment:Fair ? ?Insight:Fair ? ? ?Executive Functions  ?Concentration:Fair ? ?Attention Span:Fair ? ?Recall:Fair ? ?Fund of Knowledge:Good ? ?Language:Fair ? ? ?Psychomotor Activity  ?Psychomotor Activity:No data recorded ? ?Assets  ?Assets:Communication Skills; Desire for Improvement; Physical Health; Resilience; Social Support ? ? ?Sleep  ?Sleep:No data recorded ? ? ?Physical Exam: ?Physical Exam ?Vitals and nursing note reviewed.  ?Constitutional:   ?   Appearance: Normal appearance. He is normal weight.  ?Neurological:  ?   General: No focal deficit present.  ?   Mental Status: He is alert and oriented to person, place, and time.  ?Psychiatric:     ?   Mood and Affect: Mood normal.     ?   Behavior: Behavior normal.  ? ?Review of Systems  ?Constitutional: Negative.   ?HENT: Negative.    ?Eyes: Negative.   ?Respiratory: Negative.    ?Cardiovascular: Negative.   ?Gastrointestinal: Negative.   ?Genitourinary: Negative.   ?Musculoskeletal: Negative.   ?Skin: Negative.   ?Neurological: Negative.   ?Endo/Heme/Allergies: Negative.   ?Psychiatric/Behavioral: Negative.    ?Blood pressure (!) 139/92, pulse 93, temperature 97.9 ?F (36.6 ?C), temperature source Oral, resp. rate 18, height 5\' 9"  (1.753 m), weight 79.8 kg, SpO2 100 %. Body mass index is 25.99 kg/m?. ? ? ?Treatment Plan Summary: ?Daily contact with patient to assess and evaluate symptoms and progress in treatment, Medication management, and Plan start Flonase and Vistaril. ? ? , DO ?11/02/2021, 1:23 PM ? ?

## 2021-11-02 NOTE — Progress Notes (Signed)
Patient alert and oriented x 4 no distress noted, interacting appropriately with peers and staff and complaint with medication regimen. Patient denies SI/HI/AVH  and complaint with medication regimen. 15 minutes safety checks maintained will continue to monitor. ?

## 2021-11-02 NOTE — Group Note (Signed)
LCSW Group Therapy Note ? ?Group Date: 11/02/2021 ?Start Time: 1310 ?End Time: 1410 ? ? ?Type of Therapy and Topic:  Group Therapy - Healthy vs Unhealthy Coping Skills ? ?Participation Level:  Active  ? ?Description of Group ?The focus of this group was to determine what unhealthy coping techniques typically are used by group members and what healthy coping techniques would be helpful in coping with various problems. Patients were guided in becoming aware of the differences between healthy and unhealthy coping techniques. Patients were asked to identify 2-3 healthy coping skills they would like to learn to use more effectively. ? ?Therapeutic Goals ?Patients learned that coping is what human beings do all day long to deal with various situations in their lives ?Patients defined and discussed healthy vs unhealthy coping techniques ?Patients identified their preferred coping techniques and identified whether these were healthy or unhealthy ?Patients determined 2-3 healthy coping skills they would like to become more familiar with and use more often. ?Patients provided support and ideas to each other ? ? ?Summary of Patient Progress: Patient presented a few minutes after the start of the group session. Patient was an active listener and participated in the topic of discussion. Patient identified drinking alcohol as an unhealthy coping skill, reporting that drinking almost led to death recently due to health issues. Patient also identified isolating as a behavior he has engaged in recently. Patient shared how he achieved a period of abstinence in the past and expressed how exercise was an important coping skill during his period of sobriety. Patient identified that he tends to "overdue it" regarding many of his behaviors, including drinking alcohol and working out. Patient identified taking more walks and cooking as healthy coping skills he would like to use more often.  ? ? ? ?Therapeutic Modalities ?Cognitive Behavioral  Therapy ?Motivational Interviewing ? ?Ileana Ladd [place], LCSWA ?11/02/2021  3:30 PM   ?

## 2021-11-02 NOTE — Progress Notes (Signed)
D: Pt alert and oriented. Pt rates depression 8/10, hopelessness 9/10, and anxiety 8/10.  Pt reports sleep last night as being good. Pt denies experiencing any pain at this time. Pt denies experiencing any SI/HI, or AVH at this time.  ? ?A: Scheduled medications and prn given. Per MD orders. Support and encouragement provided. Frequent verbal contact made. Routine safety checks conducted q15 minutes.  ? ?R: No adverse drug reactions noted. Pt verbally contracts for safety at this time. Pt complaint with medications and treatment plan. Pt interacts well with others on the unit. Pt remains safe at this time. Will continue to monitor.    ?

## 2021-11-02 NOTE — Plan of Care (Signed)
  Problem: Education: Goal: Knowledge of [facility] Education information/materials will improve Outcome: Progressing Goal: Emotional status will improve Outcome: Progressing Goal: Mental status will improve Outcome: Progressing Goal: Verbalization of understanding the information provided will improve Outcome: Progressing   Problem: Activity: Goal: Interest or engagement in activities will improve Outcome: Progressing Goal: Sleeping patterns will improve Outcome: Progressing   Problem: Coping: Goal: Ability to verbalize frustrations and anger appropriately will improve Outcome: Progressing Goal: Ability to demonstrate self-control will improve Outcome: Progressing   Problem: Health Behavior/Discharge Planning: Goal: Identification of resources available to assist in meeting health care needs will improve Outcome: Progressing Goal: Compliance with treatment plan for underlying cause of condition will improve Outcome: Progressing   Problem: Physical Regulation: Goal: Ability to maintain clinical measurements within normal limits will improve Outcome: Progressing   Problem: Safety: Goal: Periods of time without injury will increase Outcome: Progressing   Problem: Education: Goal: Utilization of techniques to improve thought processes will improve Outcome: Progressing Goal: Knowledge of the prescribed therapeutic regimen will improve Outcome: Progressing   Problem: Activity: Goal: Interest or engagement in leisure activities will improve Outcome: Progressing Goal: Imbalance in normal sleep/wake cycle will improve Outcome: Progressing   Problem: Coping: Goal: Coping ability will improve Outcome: Progressing Goal: Will verbalize feelings Outcome: Progressing   Problem: Health Behavior/Discharge Planning: Goal: Ability to make decisions will improve Outcome: Progressing Goal: Compliance with therapeutic regimen will improve Outcome: Progressing    Problem: Role Relationship: Goal: Will demonstrate positive changes in social behaviors and relationships Outcome: Progressing   Problem: Safety: Goal: Ability to disclose and discuss suicidal ideas will improve Outcome: Progressing Goal: Ability to identify and utilize support systems that promote safety will improve Outcome: Progressing   Problem: Self-Concept: Goal: Will verbalize positive feelings about self Outcome: Progressing Goal: Level of anxiety will decrease Outcome: Progressing   Problem: Education: Goal: Ability to state activities that reduce stress will improve Outcome: Progressing   Problem: Coping: Goal: Ability to identify and develop effective coping behavior will improve Outcome: Progressing   Problem: Self-Concept: Goal: Ability to identify factors that promote anxiety will improve Outcome: Progressing Goal: Level of anxiety will decrease Outcome: Progressing Goal: Ability to modify response to factors that promote anxiety will improve Outcome: Progressing   

## 2021-11-03 DIAGNOSIS — F1010 Alcohol abuse, uncomplicated: Secondary | ICD-10-CM | POA: Diagnosis not present

## 2021-11-03 MED ORDER — PANTOPRAZOLE SODIUM 20 MG PO TBEC
20.0000 mg | DELAYED_RELEASE_TABLET | Freq: Every day | ORAL | Status: DC
  Administered 2021-11-03 – 2021-11-06 (×4): 20 mg via ORAL
  Filled 2021-11-03 (×4): qty 1

## 2021-11-03 MED ORDER — FLUOXETINE HCL 20 MG PO CAPS
20.0000 mg | ORAL_CAPSULE | Freq: Every day | ORAL | Status: DC
  Administered 2021-11-04 – 2021-11-06 (×3): 20 mg via ORAL
  Filled 2021-11-03 (×4): qty 1

## 2021-11-03 NOTE — Progress Notes (Addendum)
Patient alert oriented x 4, affect is blunted thoughts are organized and coherent, he denies SI/HI/AVH. Patient refused evening PRN Seroquel this shift. 15 minutes safety checks maintained will continue to monitor  ?

## 2021-11-03 NOTE — Progress Notes (Signed)
D: Patient alert and oriented. Patient denies pain. Patient rates anxiety 8/10. Patient rates depression 8/10. Patient expresses feelings of hopelessness. Patient denies SI/HI/AVH, but states he has passing thoughts of SI throughout the day. Patient contracts for safety.  ? ?A: Scheduled medications administered to patient, per MD orders, except for scheduled Prozac which patient refused stating he would like to start taking this medication during morning medications. Support and encouragement provided to patient.  ?Q15 minute safety checks maintained.  ? ?R: Patient compliant with medication administration and treatment plan. No adverse drug reactions noted. Patient remains safe on the unit at this time.  ?

## 2021-11-03 NOTE — Progress Notes (Signed)
[facility] MD Progress Note ? ?11/03/2021 1:11 PM ?GEOFFERY STOLARSKI  ?MRN:  [id number] ?Subjective: Lee Sparks is seen on rounds.  He states that the Wellbutrin makes him more anxious.  He feels like the Vistaril has been helpful.  I discussed starting Prozac with him and he was onboard with that. ? ?Principal Problem: Alcohol abuse ?Diagnosis: Principal Problem: ?  Alcohol abuse ?Active Problems: ?  Hypertension ?  Severe recurrent major depression without psychotic features ([place]) ? ?Total Time spent with patient: 15 minutes ? ?Past Psychiatric History: See H&P ? ?Past Medical History:  ?Past Medical History:  ?Diagnosis Date  ? Alcohol-induced pancreatitis   ? Anemia   ? Asthma   ? Bronchitis   ?  ?Past Surgical History:  ?Procedure Laterality Date  ? CYSTECTOMY    ? ESOPHAGOGASTRODUODENOSCOPY  07/28/2011  ? Procedure: ESOPHAGOGASTRODUODENOSCOPY (EGD);  Surgeon: Landry Dyke, MD;  Location: [facility] ENDOSCOPY;  Service: Endoscopy;  Laterality: N/A;  ? HERNIA REPAIR    ? ?Family History:  ?Family History  ?Problem Relation Age of Onset  ? Hypertension Father   ? Aneurysm Father   ? Cancer Other   ? Hypertension Mother   ? Pneumonia Mother   ? ? ? ? ?Social History:  ?Social History  ? ?Substance and Sexual Activity  ?Alcohol Use Yes  ? Alcohol/week: 50.0 standard drinks  ? Types: 50 Cans of beer per week  ? Comment: Equivalent of two 40 oz beers daily.  ?   ?Social History  ? ?Substance and Sexual Activity  ?Drug Use No  ?  ?Social History  ? ?Socioeconomic History  ? Marital status: Single  ?  Spouse name: Not on file  ? Number of children: Not on file  ? Years of education: Not on file  ? Highest education level: Not on file  ?Occupational History  ? Not on file  ?Tobacco Use  ? Smoking status: Every Day  ?  Packs/day: 0.50  ?  Years: 15.00  ?  Pack years: 7.50  ?  Types: Cigarettes  ?  Last attempt to quit: 06/27/2011  ?  Years since quitting: 10.3  ? Smokeless tobacco: Never  ?Substance and Sexual Activity  ? Alcohol use: Yes   ?  Alcohol/week: 50.0 standard drinks  ?  Types: 50 Cans of beer per week  ?  Comment: Equivalent of two 40 oz beers daily.  ? Drug use: No  ? Sexual activity: Not Currently  ?Other Topics Concern  ? Not on file  ?Social History Narrative  ? Not on file  ? ?Social Determinants of Health  ? ?Financial Resource Strain: Not on file  ?Food Insecurity: Not on file  ?Transportation Needs: Not on file  ?Physical Activity: Not on file  ?Stress: Not on file  ?Social Connections: Not on file  ? ?Additional Social History:  ?  ?  ?  ?  ?  ?  ?  ?  ?  ?  ?  ? ?Sleep: Good ? ?Appetite:  Good ? ?Current Medications: ?Current Facility-Administered Medications  ?Medication Dose Route Frequency Provider Last Rate Last Admin  ? acetaminophen (TYLENOL) tablet 650 mg  650 mg Oral Q6H PRN [name], [name], MD   650 mg at 10/31/21 0813  ? alum & mag hydroxide-simeth (MAALOX/MYLANTA) 200-200-20 MG/5ML suspension 30 mL  30 mL Oral Q4H PRN [name], [name], MD   30 mL at 11/02/21 2009  ? amLODipine (NORVASC) tablet 5 mg  5 mg Oral Daily [name],  [name], MD   5 mg at 11/03/21 0719  ? aspirin EC tablet 81 mg  81 mg Oral Daily [name], [name], MD   81 mg at 11/03/21 0719  ? FLUoxetine (PROZAC) capsule 20 mg  20 mg Oral Daily Parks Ranger, DO      ? fluticasone [facility]) 50 MCG/ACT nasal spray 2 spray  2 spray Each Nare Daily Parks Ranger, DO   2 spray at 11/02/21 1429  ? folic acid (FOLVITE) tablet 1 mg  1 mg Oral Daily [name], [name], MD   1 mg at 11/03/21 0719  ? hydrOXYzine (ATARAX) tablet 50 mg  50 mg Oral Q6H PRN Parks Ranger, DO   50 mg at 11/03/21 [id number]  ? ibuprofen (ADVIL) tablet 600 mg  600 mg Oral Q6H PRN [name], [name], MD   600 mg at 11/01/21 0834  ? loperamide (IMODIUM) capsule 2 mg  2 mg Oral PRN [name], [name], MD   2 mg at 10/31/21 1451  ? loratadine (CLARITIN) tablet 10 mg  10 mg Oral Daily [name], [name], MD   10 mg at 11/03/21 0719  ? magnesium hydroxide (MILK OF MAGNESIA) suspension 30 mL   30 mL Oral Daily PRN [name], [name], MD      ? multivitamin with minerals tablet 1 tablet  1 tablet Oral Daily [name], [name], MD   1 tablet at 11/03/21 0719  ? nicotine (NICODERM CQ - dosed in mg/24 hours) patch 14 mg  14 mg Transdermal Daily [name], [name], MD   14 mg at 10/30/21 [id number]  ? nicotine polacrilex (NICORETTE) gum 2 mg  2 mg Oral PRN [name], [name], MD   2 mg at 10/31/21 1451  ? pantoprazole (PROTONIX) EC tablet 20 mg  20 mg Oral Daily Parks Ranger, DO   20 mg at 11/03/21 1228  ? pneumococcal 20-valent conjugate vaccine (PREVNAR 20) injection 0.5 mL  0.5 mL Intramuscular Tomorrow-1000 [name], [name], MD      ? QUEtiapine (SEROQUEL) tablet 100 mg  100 mg Oral QHS PRN [name], [name], MD   100 mg at 11/01/21 2157  ? thiamine tablet 100 mg  100 mg Oral Daily [name], [name], MD   100 mg at 11/03/21 [id number]  ? Or  ? thiamine (B-1) injection 100 mg  100 mg Intravenous Daily [name], [name], MD      ? ? ?Lab Results: No results found for this or any previous visit (from the past 48 hour(s)). ? ?Blood Alcohol level:  ?Lab Results  ?Component Value Date  ? ETH 108 (H) 10/29/2021  ? ETH 440 (HH) 10/28/2021  ? ? ?Metabolic Disorder Labs: ?Lab Results  ?Component Value Date  ? HGBA1C 5.7 (H) 05/30/2020  ? ?No results found for: PROLACTIN ?Lab Results  ?Component Value Date  ? CHOL 206 (H) 05/30/2020  ? TRIG 187 (H) 05/30/2020  ? HDL 28 (L) 05/30/2020  ? CHOLHDL 7.4 (H) 05/30/2020  ? LDLCALC 144 (H) 05/30/2020  ? [place] 98 06/28/2013  ? ? ?Physical Findings: ?AIMS:  , ,  ,  ,    ?CIWA:  CIWA-Ar Total: 4 ?COWS:    ? ?Musculoskeletal: ?Strength & Muscle Tone: within normal limits ?Gait & Station: normal ?Patient leans: N/A ? ?Psychiatric Specialty Exam: ? ?Presentation  ?General Appearance: Appropriate for Environment ? ?Eye Contact:Good ? ?Speech:Clear and Coherent ? ?Speech Volume:Normal ? ?Handedness:Right ? ? ?Mood and Affect  ?Mood:Depressed; Hopeless ? ?Affect:Depressed;  Flat; Tearful ? ? ?Thought  Process  ?Thought Processes:Goal Directed ? ?Descriptions of Associations:Intact ? ?Orientation:Full (Time, Place and Person) ? ?Thought Content:Logical ? ?History of Schizophrenia/Schizoaffective disorder:No ? ?Duration of Psychotic Symptoms:No data recorded ?Hallucinations:No data recorded ?Ideas of Reference:None ? ?Suicidal Thoughts:No data recorded ?Homicidal Thoughts:No data recorded ? ?Sensorium  ?Memory:Immediate Fair; Recent Fair; Remote Fair ? ?Judgment:Fair ? ?Insight:Fair ? ? ?Executive Functions  ?Concentration:Fair ? ?Attention Span:Fair ? ?Recall:Fair ? ?Fund of [place] ? ?Language:Fair ? ? ?Psychomotor Activity  ?Psychomotor Activity:No data recorded ? ?Assets  ?Assets:Communication Skills; Desire for Improvement; Physical Health; Resilience; Social Support ? ? ?Sleep  ?Sleep:No data recorded ? ? ?Physical Exam: ?Physical Exam ?Vitals and nursing note reviewed.  ?Constitutional:   ?   Appearance: Normal appearance. He is normal weight.  ?Neurological:  ?   General: No focal deficit present.  ?   Mental Status: He is alert and oriented to person, place, and time.  ?Psychiatric:     ?   Mood and Affect: Mood normal.     ?   Behavior: Behavior normal.  ? ?Review of Systems  ?Constitutional: Negative.   ?HENT: Negative.    ?Eyes: Negative.   ?Respiratory: Negative.    ?Cardiovascular: Negative.   ?Gastrointestinal: Negative.   ?Genitourinary: Negative.   ?Musculoskeletal: Negative.   ?Skin: Negative.   ?Neurological: Negative.   ?Endo/Heme/Allergies: Negative.   ?Psychiatric/Behavioral: Negative.    ?Blood pressure (!) 159/107, pulse 85, temperature 98 ?F (36.7 ?C), temperature source Oral, resp. rate 18, height 5\' 9"  (1.753 m), weight 79.8 kg, SpO2 100 %. Body mass index is 25.99 kg/m?. ? ? ?Treatment Plan Summary: ?Daily contact with patient to assess and evaluate symptoms and progress in treatment, Medication management, and Plan discontinue Wellbutrin and start Prozac 20 mg/day and  Protonix. ? ?Parks Ranger, DO ?11/03/2021, 1:11 PM ? ?

## 2021-11-03 NOTE — Group Note (Signed)
[facility] LCSW Group Therapy Note ? ? ?Group Date: 11/03/2021 ?Start Time: 1310 ?End Time: 1410 ? ? ?Type of Therapy/Topic:  Group Therapy:  Emotion Regulation ? ?Participation Level:  Did Not Attend  ? ?Mood: ? ?Description of Group:   ? The purpose of this group is to assist patients in learning to regulate negative emotions and experience positive emotions. Patients will be guided to discuss ways in which they have been vulnerable to their negative emotions. These vulnerabilities will be juxtaposed with experiences of positive emotions or situations, and patients challenged to use positive emotions to combat negative ones. Special emphasis will be placed on coping with negative emotions in conflict situations, and patients will process healthy conflict resolution skills. ? ?Therapeutic Goals: ?Patient will identify two positive emotions or experiences to reflect on in order to balance out negative emotions:  ?Patient will label two or more emotions that they find the most difficult to experience:  ?Patient will be able to demonstrate positive conflict resolution skills through discussion or role plays:  ? ?Summary of Patient Progress: Due to limited staffing, group was not held on the unit.  ? ? ? ?Therapeutic Modalities:   ?Cognitive Behavioral Therapy ?Feelings Identification ?Dialectical Behavioral Therapy ? ? ?Angellee Cohill K Raianna Slight, LCSWA ?

## 2021-11-04 DIAGNOSIS — F1010 Alcohol abuse, uncomplicated: Secondary | ICD-10-CM | POA: Diagnosis not present

## 2021-11-04 MED ORDER — AZITHROMYCIN 250 MG PO TABS
250.0000 mg | ORAL_TABLET | Freq: Every day | ORAL | Status: DC
  Administered 2021-11-05 – 2021-11-06 (×2): 250 mg via ORAL
  Filled 2021-11-04 (×2): qty 1

## 2021-11-04 MED ORDER — AZITHROMYCIN 500 MG PO TABS
500.0000 mg | ORAL_TABLET | Freq: Every day | ORAL | Status: AC
  Administered 2021-11-04: 500 mg via ORAL
  Filled 2021-11-04: qty 1

## 2021-11-04 NOTE — Progress Notes (Signed)
Patient pleasant and cooperative. Denies SI, HI, AVH. Reports he only wants vistaril to help relax and help sleep. Reports with other medications, he wakes up to loopy. Reports feeling rested with just vistaril. Medication given to patient with good relief. No other complaints or concerns voiced. ?Encouragement and support provided. Safety checks maintained. Medications given as prescribed. Pt receptive and remains safe on unit with q 15 min checks.  ?

## 2021-11-04 NOTE — Progress Notes (Addendum)
Recreation Therapy Notes ? ?Date: 11/04/2021 ? ?Time: 10:40 am    ? ?Location: Craft room    ? ?Behavioral response: Appropriate ? ?Intervention Topic: Stress Management   ? ?Discussion/Intervention:  ?Group content on today was focused on stress. The group defined stress and way to cope with stress. Participants expressed how they know when they are stresses out. Individuals described the different ways they have to cope with stress. The group stated reasons why it is important to cope with stress. Patient explained what good stress is and some examples. The group participated in the intervention ?Stress Management?. Individuals were separated into two group and answered questions related to stress.  ?Clinical Observations/Feedback: ?Patient came to group and defined stress management as having balance. He identified drinking as his past stress management tool and explained that he is trying to workout instead of drinking. Participant expressed that stress management is important to have better health and stay out of the hospital. Individual was social with peers and staff while participating in the intervention.    ?Raeqwon Lux LRT/CTRS  ? ? ? ? ? ? ? ?Akaash Vandewater ?11/04/2021 11:53 AM ?

## 2021-11-04 NOTE — Progress Notes (Signed)
D: Patient c/o sinus draining this morning. He was placed on first dose of zithromyacin. Patient states he observed some bloody draining and is complaining of facial pain in which he was given ibuprofen with good results. Patient rates his depression and hopelessness as an 8; anxiety as a 9. His goal today is to "get better."  ? ?A: Continue to monitor medication management and MD orders.  Safety checks completed every 15 minutes per protocol.  Offer support and encouragement as needed. ? ?R: Patient is receptive to staff; his behavior is appropriate.   ? ? 11/04/21 0900  ?Psych Admission Type (Psych Patients Only)  ?Admission Status Involuntary  ?Psychosocial Assessment  ?Patient Complaints Anxiety;Depression  ?Eye Contact Brief  ?Facial Expression Anxious  ?Affect Appropriate to circumstance  ?Speech Logical/coherent  ?Interaction Assertive  ?Motor Activity Slow  ?Appearance/Hygiene In scrubs  ?Behavior Characteristics Cooperative  ?Mood Pleasant  ?Thought Process  ?Coherency WDL  ?Content WDL  ?Delusions None reported or observed  ?Perception WDL  ?Hallucination None reported or observed  ?Judgment WDL  ?Confusion WDL  ?Danger to Self  ?Current suicidal ideation? Denies  ?Self-Injurious Behavior No self-injurious ideation or behavior indicators observed or expressed   ?Agreement Not to Harm Self Yes  ?Description of Agreement verbal  ?Danger to Others  ?Danger to Others None reported or observed  ? ? ?

## 2021-11-04 NOTE — Plan of Care (Signed)
°  Problem: Education: °Goal: Emotional status will improve °Outcome: Progressing °Goal: Mental status will improve °Outcome: Progressing °  °Problem: Activity: °Goal: Interest or engagement in activities will improve °Outcome: Progressing °  °Problem: Coping: °Goal: Ability to verbalize frustrations and anger appropriately will improve °Outcome: Progressing °  °Problem: Safety: °Goal: Periods of time without injury will increase °Outcome: Progressing °  °

## 2021-11-04 NOTE — Progress Notes (Signed)
[facility] ? ?11/04/2021 11:34 AM ?Lee SequinAnthony L Sparks  ?MRN:  [id number] ?Subjective: Follow-up for this 51 year old man with depression and alcohol abuse.  Chief complaint today is that he continues to have pain in his sinuses and also that this morning when he blew his nose he had blood in his nose.  He is not having an ongoing nosebleed.  Continues to have some discomfort in his sinus area although he is not running a fever.  Mood is still feeling a little bit down.  No active suicidal thought but passive hopelessness.  Tolerating new medication so far ?Principal Problem: Alcohol abuse ?Diagnosis: Principal Problem: ?  Alcohol abuse ?Active Problems: ?  Hypertension ?  Severe recurrent major depression without psychotic features (HCC) ? ?Total Time spent with patient: 30 minutes ? ?Past Psychiatric History: Past history of alcohol abuse and depression ? ?Past Medical History:  ?Past Medical History:  ?Diagnosis Date  ? Alcohol-induced pancreatitis   ? Anemia   ? Asthma   ? Bronchitis   ?  ?Past Surgical History:  ?Procedure Laterality Date  ? CYSTECTOMY    ? ESOPHAGOGASTRODUODENOSCOPY  07/28/2011  ? Procedure: ESOPHAGOGASTRODUODENOSCOPY (EGD);  Surgeon: Freddy JakschWilliam M Outlaw, MD;  [facility];  Service: Endoscopy;  Laterality: N/A;  ? HERNIA REPAIR    ? ?Family History:  ?Family History  ?Problem Relation Age of Onset  ? Hypertension Father   ? Aneurysm Father   ? Cancer Other   ? Hypertension Mother   ? Pneumonia Mother   ? ?Family Psychiatric  History: See previous ?Social History:  ?Social History  ? ?Substance and Sexual Activity  ?Alcohol Use Yes  ? Alcohol/week: 50.0 standard drinks  ? Types: 50 Cans of beer per week  ? Comment: Equivalent of two 40 oz beers daily.  ?   ?Social History  ? ?Substance and Sexual Activity  ?Drug Use No  ?  ?Social History  ? ?Socioeconomic History  ? Marital status: Single  ?  Spouse name: Not on file  ? Number of children: Not on file  ? Years of education: Not on  file  ? Highest education level: Not on file  ?Occupational History  ? Not on file  ?Tobacco Use  ? Smoking status: Every Day  ?  Packs/day: 0.50  ?  Years: 15.00  ?  Pack years: 7.50  ?  Types: Cigarettes  ?  Last attempt to quit: 06/27/2011  ?  Years since quitting: 10.3  ? Smokeless tobacco: Never  ?Substance and Sexual Activity  ? Alcohol use: Yes  ?  Alcohol/week: 50.0 standard drinks  ?  Types: 50 Cans of beer per week  ?  Comment: Equivalent of two 40 oz beers daily.  ? Drug use: No  ? Sexual activity: Not Currently  ?Other Topics Concern  ? Not on file  ?Social History Narrative  ? Not on file  ? ?Social Determinants of Health  ? ?Financial Resource Strain: Not on file  ?Food Insecurity: Not on file  ?Transportation Needs: Not on file  ?Physical Activity: Not on file  ?Stress: Not on file  ?Social Connections: Not on file  ? ?Additional Social History:  ?  ?  ?  ?  ?  ?  ?  ?  ?  ?  ?  ? ?Sleep: Fair ? ?Appetite:  Fair ? ?Current Medications: ?Current Facility-Administered Medications  ?Medication Dose Route Frequency Provider Last Rate Last Admin  ? acetaminophen (TYLENOL) tablet 650 mg  650 mg Oral Q6H PRN Kengo Sturges, [name], MD   650 mg at 10/31/21 0813  ? alum & mag hydroxide-simeth (MAALOX/MYLANTA) 200-200-20 MG/5ML suspension 30 mL  30 mL Oral Q4H PRN Maxden Naji, [name], MD   30 mL at 11/02/21 2009  ? amLODipine (NORVASC) tablet 5 mg  5 mg Oral Daily Lorelle Macaluso, [name], MD   5 mg at 11/04/21 3419  ? aspirin EC tablet 81 mg  81 mg Oral Daily Samuel Mcpeek, [name], MD   81 mg at 11/04/21 6222  ? azithromycin (ZITHROMAX) tablet 500 mg  500 mg Oral Daily Tynia Wiers T, MD      ? Followed by  ? Melene Muller ON 11/05/2021] azithromycin (ZITHROMAX) tablet 250 mg  250 mg Oral Daily Jericho Alcorn T, MD      ? FLUoxetine (PROZAC) capsule 20 mg  20 mg Oral Daily Sarina Ill, DO   20 mg at 11/04/21 9798  ? fluticasone (FLONASE) 50 MCG/ACT nasal spray 2 spray  2 spray Each Nare Daily Sarina Ill, DO   2 spray  at 11/02/21 1429  ? folic acid (FOLVITE) tablet 1 mg  1 mg Oral Daily Hanne Kegg T, MD   1 mg at 11/04/21 9211  ? hydrOXYzine (ATARAX) tablet 50 mg  50 mg Oral Q6H PRN Sarina Ill, DO   50 mg at 11/04/21 0755  ? ibuprofen (ADVIL) tablet 600 mg  600 mg Oral Q6H PRN Keysi Oelkers, [name], MD   600 mg at 11/04/21 0948  ? loperamide (IMODIUM) capsule 2 mg  2 mg Oral PRN Clyda Smyth, [name], MD   2 mg at 10/31/21 1451  ? loratadine (CLARITIN) tablet 10 mg  10 mg Oral Daily Kennette Cuthrell,  T, MD   10 mg at 11/04/21 9417  ? magnesium hydroxide (MILK OF MAGNESIA) suspension 30 mL  30 mL Oral Daily PRN Jesaiah Fabiano, [name], MD      ? multivitamin with minerals tablet 1 tablet  1 tablet Oral Daily Terrill Alperin, [name], MD   1 tablet at 11/04/21 4081  ? nicotine (NICODERM CQ - dosed in mg/24 hours) patch 14 mg  14 mg Transdermal Daily Samiha Denapoli, [name], MD   14 mg at 10/30/21 4481  ? nicotine polacrilex (NICORETTE) gum 2 mg  2 mg Oral PRN Kitt Minardi, [name], MD   2 mg at 11/04/21 0755  ? pantoprazole (PROTONIX) EC tablet 20 mg  20 mg Oral Daily Sarina Ill, DO   20 mg at 11/04/21 8563  ? pneumococcal 20-valent conjugate vaccine (PREVNAR 20) injection 0.5 mL  0.5 mL Intramuscular Tomorrow-1000 Tamario Heal T, MD      ? QUEtiapine (SEROQUEL) tablet 100 mg  100 mg Oral QHS PRN [employer], [name], MD   100 mg at 11/01/21 2157  ? thiamine tablet 100 mg  100 mg Oral Daily , [name], MD   100 mg at 11/04/21 1497  ? Or  ? thiamine (B-1) injection 100 mg  100 mg Intravenous Daily Flavio Lindroth T, MD      ? ? ?Lab Results: No results found for this or any previous visit (from the past 48 hour(s)). ? ?Blood Alcohol level:  ?Lab Results  ?Component Value Date  ? ETH 108 (H) 10/29/2021  ? ETH 440 (HH) 10/28/2021  ? ? ?Metabolic Disorder Labs: ?Lab Results  ?Component Value Date  ? HGBA1C 5.7 (H) 05/30/2020  ? ?No results found for: PROLACTIN ?Lab Results  ?Component Value Date  ?  CHOL 206 (H) 05/30/2020  ? TRIG 187 (H) 05/30/2020  ?  HDL 28 (L) 05/30/2020  ? CHOLHDL 7.4 (H) 05/30/2020  ? LDLCALC 144 (H) 05/30/2020  ? LDLCALC 98 06/28/2013  ? ? ?Physical Findings: ?AIMS:  , ,  ,  ,    ?CIWA:  CIWA-Ar Total: 4 ?COWS:    ? ?Musculoskeletal: ?Strength & Muscle Tone: within normal limits ?Gait & Station: normal ?Patient leans: N/A ? ?Psychiatric Specialty Exam: ? ?Presentation  ?General Appearance: Appropriate for Environment ? ?Eye Contact:Good ? ?Speech:Clear and Coherent ? ?Speech Volume:Normal ? ?Handedness:Right ? ? ?Mood and Affect  ?Mood:Depressed; Hopeless ? ?Affect:Depressed; Flat; Tearful ? ? ?Thought Process  ?Thought Processes:Goal Directed ? ?Descriptions of Associations:Intact ? ?Orientation:Full (Time, Place and Person) ? ?Thought Content:Logical ? ?History of Schizophrenia/Schizoaffective disorder:No ? ?Duration of Psychotic Symptoms:No data recorded ?Hallucinations:No data recorded ?Ideas of Reference:None ? ?Suicidal Thoughts:No data recorded ?Homicidal Thoughts:No data recorded ? ?Sensorium  ?Memory:Immediate Fair; Recent Fair; Remote Fair ? ?Judgment:Fair ? ?Insight:Fair ? ? ?Executive Functions  ?Concentration:Fair ? ?Attention Span:Fair ? ?Recall:Fair ? ?Fund of Knowledge:Good ? ?Language:Fair ? ? ?Psychomotor Activity  ?Psychomotor Activity:No data recorded ? ?Assets  ?Assets:Communication Skills; Desire for Improvement; Physical Health; Resilience; Social Support ? ? ?Sleep  ?Sleep:No data recorded ? ? ?Physical Exam: ?Physical Exam ?Vitals and nursing note reviewed.  ?Constitutional:   ?   Appearance: Normal appearance.  ?HENT:  ?   Head: Normocephalic and atraumatic.  ?   Mouth/Throat:  ?   Pharynx: Oropharynx is clear.  ?Eyes:  ?   Pupils: Pupils are equal, round, and reactive to light.  ?Cardiovascular:  ?   Rate and Rhythm: Normal rate and regular rhythm.  ?Pulmonary:  ?   Effort: Pulmonary effort is normal.  ?   Breath sounds: Normal breath sounds.  ?Abdominal:  ?   General: Abdomen is flat.  ?   Palpations: Abdomen is  soft.  ?Musculoskeletal:     ?   General: Normal range of motion.  ?Skin: ?   General: Skin is warm and dry.  ?Neurological:  ?   General: No focal deficit present.  ?   Mental Status: He is alert. Menta

## 2021-11-04 NOTE — Group Note (Signed)
[facility] LCSW Group Therapy Note ? ? ? ?Group Date: 11/04/2021 ?Start Time: 1330 ?End Time: 1430 ? ?Type of Therapy and Topic:  Group Therapy:  Overcoming Obstacles ? ?Participation Level:  [facility] PARTICIPATION LEVEL: Active ? ?Mood: Euthymic  ? ?Description of Group:   ?In this group patients will be encouraged to explore what they see as obstacles to their own wellness and recovery. They will be guided to discuss their thoughts, feelings, and behaviors related to these obstacles. The group will process together ways to cope with barriers, with attention given to specific choices patients can make. Each patient will be challenged to identify changes they are motivated to make in order to overcome their obstacles. This group will be process-oriented, with patients participating in exploration of their own experiences as well as giving and receiving support and challenge from other group members. ? ?Therapeutic Goals: ?1. Patient will identify personal and current obstacles as they relate to admission. ?2. Patient will identify barriers that currently interfere with their wellness or overcoming obstacles.  ?3. Patient will identify feelings, thought process and behaviors related to these barriers. ?4. Patient will identify two changes they are willing to make to overcome these obstacles:  ? ? ?Summary of Patient Progress ? ? ?Patient was present for the entirety of the group session. Patient was an active listener and participated in the topic of discussion, provided helpful advice to others, and added nuance to topic of conversation. Patient states he believes he could has the support to overcome his obstacles this time compared to prior admission. Patient showed good incite, though realized some holes in his plans when CSW challenged his plans.  ? ? ?Therapeutic Modalities:   ?Cognitive Behavioral Therapy ?Solution Focused Therapy ?Motivational Interviewing ?Relapse Prevention  Therapy ? ? ?Durenda Hurt, LCSWA ?

## 2021-11-04 NOTE — BH IP Treatment Plan (Signed)
Interdisciplinary Treatment and Diagnostic Plan Update ? ?11/04/2021 ?Time of Session: 3875 ?Lee Sparks ?MRN: [id number] ? ?Principal Diagnosis: Alcohol abuse ? ?Secondary Diagnoses: Principal Problem: ?  Alcohol abuse ?Active Problems: ?  Hypertension ?  Severe recurrent major depression without psychotic features ([place]) ? ? ?Current Medications:  ?Current Facility-Administered Medications  ?Medication Dose Route Frequency Provider Last Rate Last Admin  ? acetaminophen (TYLENOL) tablet 650 mg  650 mg Oral Q6H PRN [name], [name], MD   650 mg at 10/31/21 0813  ? alum & mag hydroxide-simeth (MAALOX/MYLANTA) 200-200-20 MG/5ML suspension 30 mL  30 mL Oral Q4H PRN [name], [name], MD   30 mL at 11/02/21 2009  ? amLODipine (NORVASC) tablet 5 mg  5 mg Oral Daily [name], [name], MD   5 mg at 11/04/21 8416  ? aspirin EC tablet 81 mg  81 mg Oral Daily [name], [name], MD   81 mg at 11/04/21 6063  ? FLUoxetine (PROZAC) capsule 20 mg  20 mg Oral Daily Parks Ranger, DO   20 mg at 11/04/21 0160  ? fluticasone (FLONASE) 50 MCG/ACT nasal spray 2 spray  2 spray Each Nare Daily Parks Ranger, DO   2 spray at 11/02/21 1429  ? folic acid (FOLVITE) tablet 1 mg  1 mg Oral Daily [name], [name], MD   1 mg at 11/04/21 1093  ? hydrOXYzine (ATARAX) tablet 50 mg  50 mg Oral Q6H PRN Parks Ranger, DO   50 mg at 11/04/21 0755  ? ibuprofen (ADVIL) tablet 600 mg  600 mg Oral Q6H PRN [name], [name], MD   600 mg at 11/01/21 0834  ? loperamide (IMODIUM) capsule 2 mg  2 mg Oral PRN [name], [name], MD   2 mg at 10/31/21 1451  ? loratadine (CLARITIN) tablet 10 mg  10 mg Oral Daily [name], [name], MD   10 mg at 11/04/21 2355  ? magnesium hydroxide (MILK OF MAGNESIA) suspension 30 mL  30 mL Oral Daily PRN [name], [name], MD      ? multivitamin with minerals tablet 1 tablet  1 tablet Oral Daily [name], [name], MD   1 tablet at 11/04/21 7322  ? nicotine (NICODERM CQ - dosed in mg/24 hours) patch 14 mg  14 mg  Transdermal Daily [name], [name], MD   14 mg at 10/30/21 0254  ? nicotine polacrilex (NICORETTE) gum 2 mg  2 mg Oral PRN [name], [name], MD   2 mg at 10/31/21 1451  ? pantoprazole (PROTONIX) EC tablet 20 mg  20 mg Oral Daily Parks Ranger, DO   20 mg at 11/03/21 1228  ? pneumococcal 20-valent conjugate vaccine (PREVNAR 20) injection 0.5 mL  0.5 mL Intramuscular Tomorrow-1000 [name], [name], MD      ? QUEtiapine (SEROQUEL) tablet 100 mg  100 mg Oral QHS PRN [name], [name], MD   100 mg at 11/01/21 2157  ? thiamine tablet 100 mg  100 mg Oral Daily [name], [name], MD   100 mg at 11/04/21 2706  ? Or  ? thiamine (B-1) injection 100 mg  100 mg Intravenous Daily [name], [name], MD      ? ?PTA Medications: ?Medications Prior to Admission  ?Medication Sig Dispense Refill Last Dose  ? amLODipine (NORVASC) 5 MG tablet Take 1 tablet (5 mg total) by mouth daily. (Patient not taking: Reported on 10/29/2021) 30 tablet 1   ? aspirin EC 81 MG tablet Take 81  mg by mouth daily. Swallow whole.     ? buPROPion (WELLBUTRIN XL) 150 MG 24 hr tablet Take 1 tablet (150 mg total) by mouth daily. (Patient not taking: Reported on 10/29/2021) 30 tablet 1   ? hydrOXYzine (ATARAX/VISTARIL) 50 MG tablet Take 1 tablet (50 mg total) by mouth every 6 (six) hours as needed for anxiety (sleep). (Patient not taking: Reported on 10/29/2021) 90 tablet 1   ? mirtazapine (REMERON) 45 MG tablet Take 1 tablet (45 mg total) by mouth at bedtime. (Patient not taking: Reported on 10/29/2021) 30 tablet 1   ? naltrexone (DEPADE) 50 MG tablet Take 1 tablet (50 mg total) by mouth daily. (Patient not taking: Reported on 10/29/2021) 30 tablet 1   ? thiamine (VITAMIN B-1) 100 MG tablet Take 1 tablet (100 mg total) by mouth daily. (Patient not taking: Reported on 10/29/2021) 30 tablet 1   ? ? ?Patient Stressors: Loss of friend and family members last month   ?Substance abuse   ? ?Patient Strengths: Communication skills  ?Motivation for treatment/growth  ? ?Treatment  Modalities: Medication Management, Group therapy, Case management,  ?1 to 1 session with clinician, Psychoeducation, Recreational therapy. ? ? ?Physician Treatment Plan for Primary Diagnosis: Alcohol abuse ?Long Term Goal(s): Improvement in symptoms so as ready for discharge  ? ?Short Term Goals: Ability to verbalize feelings will improve ?Ability to disclose and discuss suicidal ideas ?Compliance with prescribed medications will improve ?Ability to identify and develop effective coping behaviors will improve ?Ability to identify triggers associated with substance abuse/mental health issues will improve ? ?Medication Management: Evaluate patient's response, side effects, and tolerance of medication regimen. ? ?Therapeutic Interventions: 1 to 1 sessions, Unit Group sessions and Medication administration. ? ?Evaluation of Outcomes: Progressing ? ?Physician Treatment Plan for Secondary Diagnosis: Principal Problem: ?  Alcohol abuse ?Active Problems: ?  Hypertension ?  Severe recurrent major depression without psychotic features ([place]) ? ?Long Term Goal(s): Improvement in symptoms so as ready for discharge  ? ?Short Term Goals: Ability to verbalize feelings will improve ?Ability to disclose and discuss suicidal ideas ?Compliance with prescribed medications will improve ?Ability to identify and develop effective coping behaviors will improve ?Ability to identify triggers associated with substance abuse/mental health issues will improve    ? ?Medication Management: Evaluate patient's response, side effects, and tolerance of medication regimen. ? ?Therapeutic Interventions: 1 to 1 sessions, Unit Group sessions and Medication administration. ? ?Evaluation of Outcomes: Progressing ? ? ?RN Treatment Plan for Primary Diagnosis: Alcohol abuse ?Long Term Goal(s): Knowledge of disease and therapeutic regimen to maintain health will improve ? ?Short Term Goals: Ability to remain free from injury will improve, Ability to verbalize  frustration and anger appropriately will improve, Ability to demonstrate self-control, Ability to participate in decision making will improve, Ability to verbalize feelings will improve, Ability to disclose and discuss suicidal ideas, Ability to identify and develop effective coping behaviors will improve, and Compliance with prescribed medications will improve ? ?Medication Management: RN will administer medications as ordered by provider, will assess and evaluate patient's response and provide education to patient for prescribed medication. RN will report any adverse and/or side effects to prescribing provider. ? ?Therapeutic Interventions: 1 on 1 counseling sessions, Psychoeducation, Medication administration, Evaluate responses to treatment, Monitor vital signs and CBGs as ordered, Perform/monitor CIWA, COWS, AIMS and Fall Risk screenings as ordered, Perform wound care treatments as ordered. ? ?Evaluation of Outcomes: Not Met ? ? ?LCSW Treatment Plan for Primary Diagnosis: Alcohol abuse ?Long Term Goal(s):  Safe transition to appropriate next level of care at discharge, Engage patient in therapeutic group addressing interpersonal concerns. ? ?Short Term Goals: Engage patient in aftercare planning with referrals and resources, Increase social support, Increase ability to appropriately verbalize feelings, Increase emotional regulation, Facilitate acceptance of mental health diagnosis and concerns, Facilitate patient progression through stages of change regarding substance use diagnoses and concerns, Identify triggers associated with mental health/substance abuse issues, and Increase skills for wellness and recovery ? ?Therapeutic Interventions: Assess for all discharge needs, 1 to 1 time with Education officer, museum, Explore available resources and support systems, Assess for adequacy in community support network, Educate family and significant other(s) on suicide prevention, Complete Psychosocial Assessment, Interpersonal  group therapy. ? ?Evaluation of Outcomes: Progressing ? ? ?Progress in Treatment: ?Attending groups: Yes. ?Participating in groups: Yes. ?Taking medication as prescribed: Yes. ?Toleration medication: Yes. ?Famil

## 2021-11-05 ENCOUNTER — Other Ambulatory Visit: Payer: Self-pay

## 2021-11-05 DIAGNOSIS — F1010 Alcohol abuse, uncomplicated: Secondary | ICD-10-CM | POA: Diagnosis not present

## 2021-11-05 MED ORDER — ASPIRIN 81 MG PO TBEC
81.0000 mg | DELAYED_RELEASE_TABLET | Freq: Every day | ORAL | 0 refills | Status: DC
Start: 2021-11-06 — End: 2021-11-06
  Filled 2021-11-05: qty 10, 10d supply, fill #0

## 2021-11-05 MED ORDER — HYDROXYZINE HCL 50 MG PO TABS
50.0000 mg | ORAL_TABLET | Freq: Four times a day (QID) | ORAL | 0 refills | Status: DC | PRN
  Filled 2021-11-05: qty 20, 5d supply, fill #0

## 2021-11-05 MED ORDER — AZITHROMYCIN 250 MG PO TABS
ORAL_TABLET | ORAL | 0 refills | Status: DC
  Filled 2021-11-05: qty 3, 3d supply, fill #0

## 2021-11-05 MED ORDER — QUETIAPINE FUMARATE 100 MG PO TABS
100.0000 mg | ORAL_TABLET | Freq: Every evening | ORAL | 0 refills | Status: DC | PRN
  Filled 2021-11-05: qty 10, 10d supply, fill #0

## 2021-11-05 MED ORDER — NICOTINE POLACRILEX 2 MG MT GUM
2.0000 mg | CHEWING_GUM | OROMUCOSAL | 0 refills | Status: DC | PRN
  Filled 2021-11-05: qty 110, 20d supply, fill #0

## 2021-11-05 MED ORDER — IBUPROFEN 600 MG PO TABS
600.0000 mg | ORAL_TABLET | Freq: Four times a day (QID) | ORAL | 0 refills | Status: DC | PRN
  Filled 2021-11-05: qty 20, 5d supply, fill #0

## 2021-11-05 MED ORDER — AMLODIPINE BESYLATE 5 MG PO TABS
5.0000 mg | ORAL_TABLET | Freq: Every day | ORAL | 0 refills | Status: DC
  Filled 2021-11-05: qty 10, 10d supply, fill #0

## 2021-11-05 MED ORDER — BUPROPION HCL ER (XL) 150 MG PO TB24
150.0000 mg | ORAL_TABLET | Freq: Every day | ORAL | 0 refills | Status: DC
  Filled 2021-11-05: qty 10, 10d supply, fill #0

## 2021-11-05 MED ORDER — LORATADINE 10 MG PO TABS
10.0000 mg | ORAL_TABLET | Freq: Every day | ORAL | 0 refills | Status: DC
Start: 2021-11-06 — End: 2021-11-06
  Filled 2021-11-05: qty 10, 10d supply, fill #0

## 2021-11-05 MED ORDER — FLUOXETINE HCL 20 MG PO CAPS
20.0000 mg | ORAL_CAPSULE | Freq: Every day | ORAL | 0 refills | Status: DC
  Filled 2021-11-05: qty 10, 10d supply, fill #0

## 2021-11-05 MED ORDER — PANTOPRAZOLE SODIUM 20 MG PO TBEC
20.0000 mg | DELAYED_RELEASE_TABLET | Freq: Every day | ORAL | 0 refills | Status: DC
  Filled 2021-11-05: qty 10, 10d supply, fill #0

## 2021-11-05 MED ORDER — FLUTICASONE PROPIONATE 50 MCG/ACT NA SUSP
2.0000 | Freq: Every day | NASAL | 0 refills | Status: DC
  Filled 2021-11-05: qty 16, 30d supply, fill #0

## 2021-11-05 NOTE — Plan of Care (Signed)
D- Patient alert and oriented. Patient presented in a pleasant mood on assessment reporting that he slept "fair" last night and had complaints of anxiety and a sinus headache. Patient rated his pain a "7/10", in which he requested PRN Vistaril and Ibuprofen. Patient rated his depression, anxiety, and hopelessness an "8/10", stating that "trying to figure out a plan to move on, the future, and still thinking about family that have passed away", is why he's feeling this way. Patient continues to report passive SI, stating to this writer that they are "just passing thoughts, nothing serious". Patient denied HI/AVH at this time. Patient's goal for today is "feeling better, try to set my plan to leave", in which he will "call people", in order to achieve his goal. ? ?A- Scheduled medications administered to patient, per MD orders. Support and encouragement provided.  Routine safety checks conducted every 15 minutes.  Patient informed to notify staff with problems or concerns. ? ?R- No adverse drug reactions noted. Patient contracts for safety at this time. Patient compliant with medications and treatment plan. Patient receptive, calm, and cooperative. Patient interacts well with others on the unit.  Patient remains safe at this time. ? ?Problem: Education: ?Goal: Knowledge of [facility] Education information/materials will improve ?Outcome: Progressing ?Goal: Emotional status will improve ?Outcome: Progressing ?Goal: Mental status will improve ?Outcome: Progressing ?Goal: Verbalization of understanding the information provided will improve ?Outcome: Progressing ?  ?Problem: Activity: ?Goal: Interest or engagement in activities will improve ?Outcome: Progressing ?Goal: Sleeping patterns will improve ?Outcome: Progressing ?  ?Problem: Coping: ?Goal: Ability to verbalize frustrations and anger appropriately will improve ?Outcome: Progressing ?Goal: Ability to demonstrate self-control will improve ?Outcome:  Progressing ?  ?Problem: Health Behavior/Discharge Planning: ?Goal: Identification of resources available to assist in meeting health care needs will improve ?Outcome: Progressing ?Goal: Compliance with treatment plan for underlying cause of condition will improve ?Outcome: Progressing ?  ?Problem: Physical Regulation: ?Goal: Ability to maintain clinical measurements within normal limits will improve ?Outcome: Progressing ?  ?Problem: Safety: ?Goal: Periods of time without injury will increase ?Outcome: Progressing ?  ?Problem: Education: ?Goal: Utilization of techniques to improve thought processes will improve ?Outcome: Progressing ?Goal: Knowledge of the prescribed therapeutic regimen will improve ?Outcome: Progressing ?  ?Problem: Activity: ?Goal: Interest or engagement in leisure activities will improve ?Outcome: Progressing ?Goal: Imbalance in normal sleep/wake cycle will improve ?Outcome: Progressing ?  ?Problem: Coping: ?Goal: Coping ability will improve ?Outcome: Progressing ?Goal: Will verbalize feelings ?Outcome: Progressing ?  ?Problem: Health Behavior/Discharge Planning: ?Goal: Ability to make decisions will improve ?Outcome: Progressing ?Goal: Compliance with therapeutic regimen will improve ?Outcome: Progressing ?  ?Problem: Role Relationship: ?Goal: Will demonstrate positive changes in social behaviors and relationships ?Outcome: Progressing ?  ?Problem: Safety: ?Goal: Ability to disclose and discuss suicidal ideas will improve ?Outcome: Progressing ?Goal: Ability to identify and utilize support systems that promote safety will improve ?Outcome: Progressing ?  ?Problem: Self-Concept: ?Goal: Will verbalize positive feelings about self ?Outcome: Progressing ?Goal: Level of anxiety will decrease ?Outcome: Progressing ?  ?Problem: Education: ?Goal: Ability to state activities that reduce stress will improve ?Outcome: Progressing ?  ?Problem: Coping: ?Goal: Ability to identify and develop effective coping  behavior will improve ?Outcome: Progressing ?  ?Problem: Self-Concept: ?Goal: Ability to identify factors that promote anxiety will improve ?Outcome: Progressing ?Goal: Level of anxiety will decrease ?Outcome: Progressing ?Goal: Ability to modify response to factors that promote anxiety will improve ?Outcome: Progressing ?  ?

## 2021-11-05 NOTE — Progress Notes (Signed)
This Clinical research associate spoke to MD, via secure chat, and was informed that patient will receive Vistaril in his 10-day supply, as well as a written prescription. Patient was notified by this Clinical research associate and verbalized understanding. ?

## 2021-11-05 NOTE — Progress Notes (Signed)
Patient came to this writer and asked for something for anxiety, so PRN Vistaril was administered to patient. Patient also had questions about if he was going to get a prescription for this medication, when he discharges. This writer expressed to patient that MD would be made aware of this concern.  ?

## 2021-11-05 NOTE — Progress Notes (Signed)
Patient came to this Clinical research associate and asked if he would be able to charge his phone before leaving tomorrow. This Clinical research associate explained to patient that this would not be a problem and that this would be relayed to the oncoming shift to pass off to staff tomorrow. Patient was also informed to request this from staff on shift tomorrow. Patient verbalized understanding. ?

## 2021-11-05 NOTE — Progress Notes (Signed)
Patient refused his Flonase and Nicotine patch. Patient stated that he didn't need to nasal spray at this time and patient prefers Nicorette gum over the patch. However, he did not request any gum at this time. This Clinical research associate will notify MD and request to have the patch discontinued.  ?

## 2021-11-05 NOTE — Group Note (Signed)
[facility] LCSW Group Therapy Note ? ? ?Group Date: 11/05/2021 ?Start Time: 1315 ?End Time: 1415 ? ?Type of Therapy/Topic:  Group Therapy:  Feelings about Diagnosis ? ?Participation Level:  Active  ? ?Description of Group:   ? This group will allow patients to explore their thoughts and feelings about diagnoses they have received. Patients will be guided to explore their level of understanding and acceptance of these diagnoses. Facilitator will encourage patients to process their thoughts and feelings about the reactions of others to their diagnosis, and will guide patients in identifying ways to discuss their diagnosis with significant others in their lives. This group will be process-oriented, with patients participating in exploration of their own experiences as well as giving and receiving support and challenge from other group members. ? ? ?Therapeutic Goals: ?1. Patient will demonstrate understanding of diagnosis as evidence by identifying two or more symptoms of the disorder:  ?2. Patient will be able to express two feelings regarding the diagnosis ?3. Patient will demonstrate ability to communicate their needs through discussion and/or role plays ? ?Summary of Patient Progress: ?Patient was present in group. Patient was an active participant in group.  Patient was able to engage with others.  Patient shared his thoughts about diagnosis and the stigma associated with mental health and physical health.  ? ?Therapeutic Modalities:   ?Cognitive Behavioral Therapy ?Brief Therapy ?Feelings Identification  ? ? ?Harden Mo, LCSW ?

## 2021-11-05 NOTE — Progress Notes (Signed)
[facility] ? ?11/05/2021 12:29 PM ?Lee SequinAnthony L Sparks  ?MRN:  [id number] ?Subjective: Follow-up for this 51 year old man with alcohol abuse and depression and anxiety.  Patient is neatly dressed and groomed much more upbeat and interactive today.  He says that he has plans now that his friend will be picking him up tomorrow morning and has a place for him to stay.  Currently tolerating medicine well.  No longer having any withdrawal symptoms.  Some mild subjective anxiety. ?Principal Problem: Alcohol abuse ?Diagnosis: Principal Problem: ?  Alcohol abuse ?Active Problems: ?  Hypertension ?  Severe recurrent major depression without psychotic features (HCC) ? ?Total Time spent with patient: 30 minutes ? ?Past Psychiatric History: Past history of alcohol abuse depression and anxiety ? ?Past Medical History:  ?Past Medical History:  ?Diagnosis Date  ? Alcohol-induced pancreatitis   ? Anemia   ? Asthma   ? Bronchitis   ?  ?Past Surgical History:  ?Procedure Laterality Date  ? CYSTECTOMY    ? ESOPHAGOGASTRODUODENOSCOPY  07/28/2011  ? Procedure: ESOPHAGOGASTRODUODENOSCOPY (EGD);  Surgeon: Freddy JakschWilliam M Outlaw, MD;  Location[facility] END[facility]COPY;  Service: Endoscopy;  Laterality: N/A;  ? HERNIA REPAIR    ? ?Family History:  ?Family History  ?Problem Relation Age of Onset  ? Hypertension Father   ? Aneurysm Father   ? Cancer Other   ? Hypertension Mother   ? Pneumonia Mother   ? ?Family Psychiatric  History: See previous ?Social History:  ?Social History  ? ?Substance and Sexual Activity  ?Alcohol Use Yes  ? Alcohol/week: 50.0 standard drinks  ? Types: 50 Cans of beer per week  ? Comment: Equivalent of two 40 oz beers daily.  ?   ?Social History  ? ?Substance and Sexual Activity  ?Drug Use No  ?  ?Social History  ? ?Socioeconomic History  ? Marital status: Single  ?  Spouse name: Not on file  ? Number of children: Not on file  ? Years of education: Not on file  ? Highest education level: Not on file  ?Occupational History  ? Not  on file  ?Tobacco Use  ? Smoking status: Every Day  ?  Packs/day: 0.50  ?  Years: 15.00  ?  Pack years: 7.50  ?  Types: Cigarettes  ?  Last attempt to quit: 06/27/2011  ?  Years since quitting: 10.3  ? Smokeless tobacco: Never  ?Substance and Sexual Activity  ? Alcohol use: Yes  ?  Alcohol/week: 50.0 standard drinks  ?  Types: 50 Cans of beer per week  ?  Comment: Equivalent of two 40 oz beers daily.  ? Drug use: No  ? Sexual activity: Not Currently  ?Other Topics Concern  ? Not on file  ?Social History Narrative  ? Not on file  ? ?Social Determinants of Health  ? ?Financial Resource Strain: Not on file  ?Food Insecurity: Not on file  ?Transportation Needs: Not on file  ?Physical Activity: Not on file  ?Stress: Not on file  ?Social Connections: Not on file  ? ?Additional Social History:  ?  ?  ?  ?  ?  ?  ?  ?  ?  ?  ?  ? ?Sleep: Fair ? ?Appetite:  Fair ? ?Current Medications: ?Current Facility-Administered Medications  ?Medication Dose Route Frequency Provider Last Rate Last Admin  ? acetaminophen (TYLENOL) tablet 650 mg  650 mg Oral Q6H PRN Gay Rape, Jackquline DenmarkJohn T, MD   650 mg at 10/31/21 0813  ? alum &  mag hydroxide-simeth (MAALOX/MYLANTA) 200-200-20 MG/5ML suspension 30 mL  30 mL Oral Q4H PRN Surya Schroeter, [name], MD   30 mL at 11/02/21 2009  ? amLODipine (NORVASC) tablet 5 mg  5 mg Oral Daily Tam Delisle T, MD   5 mg at 11/05/21 0830  ? aspirin EC tablet 81 mg  81 mg Oral Daily Justen Fonda, [name], MD   81 mg at 11/05/21 7893  ? azithromycin (ZITHROMAX) tablet 250 mg  250 mg Oral Daily Keisean Skowron, [name], MD   250 mg at 11/05/21 0830  ? FLUoxetine (PROZAC) capsule 20 mg  20 mg Oral Daily Sarina Ill, DO   20 mg at 11/05/21 0830  ? fluticasone (FLONASE) 50 MCG/ACT nasal spray 2 spray  2 spray Each Nare Daily Sarina Ill, DO   2 spray at 11/02/21 1429  ? folic acid (FOLVITE) tablet 1 mg  1 mg Oral Daily Rafal Archuleta T, MD   1 mg at 11/05/21 0830  ? hydrOXYzine (ATARAX) tablet 50 mg  50 mg Oral Q6H PRN  Sarina Ill, DO   50 mg at 11/05/21 8101  ? ibuprofen (ADVIL) tablet 600 mg  600 mg Oral Q6H PRN Lynn Recendiz, [name], MD   600 mg at 11/05/21 0834  ? loperamide (IMODIUM) capsule 2 mg  2 mg Oral PRN Sarha Bartelt, [name], MD   2 mg at 10/31/21 1451  ? loratadine (CLARITIN) tablet 10 mg  10 mg Oral Daily Alantis Bethune T, MD   10 mg at 11/05/21 0830  ? magnesium hydroxide (MILK OF MAGNESIA) suspension 30 mL  30 mL Oral Daily PRN Jguadalupe Opiela, [name], MD      ? multivitamin with minerals tablet 1 tablet  1 tablet Oral Daily Aasiyah Auerbach, [name], MD   1 tablet at 11/05/21 0830  ? nicotine polacrilex (NICORETTE) gum 2 mg  2 mg Oral PRN Ell Tiso, [name], MD   2 mg at 11/04/21 0755  ? pantoprazole (PROTONIX) EC tablet 20 mg  20 mg Oral Daily Sarina Ill, DO   20 mg at 11/05/21 0830  ? pneumococcal 20-valent conjugate vaccine (PREVNAR 20) injection 0.5 mL  0.5 mL Intramuscular Tomorrow-1000 Deardra Hinkley T, MD      ? QUEtiapine (SEROQUEL) tablet 100 mg  100 mg Oral QHS PRN Yahaira Bruski, [name], MD   100 mg at 11/01/21 2157  ? thiamine tablet 100 mg  100 mg Oral Daily Marcella Charlson, [name], MD   100 mg at 11/05/21 0830  ? Or  ? thiamine (B-1) injection 100 mg  100 mg Intravenous Daily Moe Graca T, MD      ? ? ?Lab Results: No results found for this or any previous visit (from the past 48 hour(s)). ? ?Blood Alcohol level:  ?Lab Results  ?Component Value Date  ? ETH 108 (H) 10/29/2021  ? ETH 440 (HH) 10/28/2021  ? ? ?Metabolic Disorder Labs: ?Lab Results  ?Component Value Date  ? HGBA1C 5.7 (H) 05/30/2020  ? ?No results found for: PROLACTIN ?Lab Results  ?Component Value Date  ? CHOL 206 (H) 05/30/2020  ? TRIG 187 (H) 05/30/2020  ? HDL 28 (L) 05/30/2020  ? CHOLHDL 7.4 (H) 05/30/2020  ? LDLCALC 144 (H) 05/30/2020  ? LDLCALC 98 06/28/2013  ? ? ?Physical Findings: ?AIMS:  , ,  ,  ,    ?CIWA:  CIWA-Ar Total: 4 ?COWS:    ? ?Musculoskeletal: ?Strength & Muscle Tone: within normal limits ?Gait & Station: normal ?Patient leans:  N/A ? ?Psychiatric Specialty Exam: ? ?Presentation  ?General Appearance: Appropriate for Environment ? ?Eye Contact:Good ? ?Speech:Clear and Coherent ? ?Speech Volume:Normal ? ?Handedness:Right ? ? ?Mood and Affect  ?Mood:Depressed; Hopeless ? ?Affect:Depressed; Flat; Tearful ? ? ?Thought Process  ?Thought Processes:Goal Directed ? ?Descriptions of Associations:Intact ? ?Orientation:Full (Time, Place and Person) ? ?Thought Content:Logical ? ?History of Schizophrenia/Schizoaffective disorder:No ? ?Duration of Psychotic Symptoms:No data recorded ?Hallucinations:No data recorded ?Ideas of Reference:None ? ?Suicidal Thoughts:No data recorded ?Homicidal Thoughts:No data recorded ? ?Sensorium  ?Memory:Immediate Fair; Recent Fair; Remote Fair ? ?Judgment:Fair ? ?Insight:Fair ? ? ?Executive Functions  ?Concentration:Fair ? ?Attention Span:Fair ? ?Recall:Fair ? ?Fund of Knowledge:Good ? ?Language:Fair ? ? ?Psychomotor Activity  ?Psychomotor Activity:No data recorded ? ?Assets  ?Assets:Communication Skills; Desire for Improvement; Physical Health; Resilience; Social Support ? ? ?Sleep  ?Sleep:No data recorded ? ? ?Physical Exam: ?Physical Exam ?Vitals and nursing note reviewed.  ?Constitutional:   ?   Appearance: Normal appearance.  ?HENT:  ?   Head: Normocephalic and atraumatic.  ?   Mouth/Throat:  ?   Pharynx: Oropharynx is clear.  ?Eyes:  ?   Pupils: Pupils are equal, round, and reactive to light.  ?Cardiovascular:  ?   Rate and Rhythm: Normal rate and regular rhythm.  ?Pulmonary:  ?   Effort: Pulmonary effort is normal.  ?   Breath sounds: Normal breath sounds.  ?Abdominal:  ?   General: Abdomen is flat.  ?   Palpations: Abdomen is soft.  ?Musculoskeletal:     ?   General: Normal range of motion.  ?Skin: ?   General: Skin is warm and dry.  ?Neurological:  ?   General: No focal deficit present.  ?   Mental Status: He is alert. Mental status is at baseline.  ?Psychiatric:     ?   Mood and Affect: Mood normal.     ?    Thought Content: Thought content normal.  ? ?Review of Systems  ?Constitutional: Negative.   ?HENT: Negative.    ?Eyes: Negative.   ?Respiratory: Negative.    ?Cardiovascular: Negative.   ?Gastrointestinal: Negative.   ?

## 2021-11-05 NOTE — Progress Notes (Signed)
Recreation Therapy Notes ? ?Date: 11/05/2021 ?  ?Time: 10:35 am   ?  ?Location: Court yard  ?  ?Behavioral response: Appropriate ?  ?Intervention Topic: Social-Skills   ?  ?Discussion/Intervention:  ?Group content on today was focused on social skills. The group defined social skills and identified ways they use social skills. Patients expressed what obstacles they face when trying to be social. Participants described the importance of social skills. The group listed ways to improve social skills and reasons to improve social skills. Individuals had an opportunity to learn new and improve social skills as well as identify their weaknesses. ?Clinical Observations/Feedback: ?Patient came to group and identified many ways to socialize with others. Participant shared experiences they have had in the past when socializing with others. Individual was social with peers and staff while participating in the intervention.    ?Tarik Teixeira LRT/CTRS  ?  ? ? ? ? ? ? ? ?Lee Sparks ?11/05/2021 12:42 PM ?

## 2021-11-05 NOTE — Plan of Care (Signed)
?  Problem: Education: ?Goal: Knowledge of [facility] Education information/materials will improve ?Outcome: Progressing ?Goal: Emotional status will improve ?Outcome: Progressing ?Goal: Mental status will improve ?Outcome: Progressing ?Goal: Verbalization of understanding the information provided will improve ?Outcome: Progressing ?  ?Problem: Activity: ?Goal: Interest or engagement in activities will improve ?Outcome: Progressing ?Goal: Sleeping patterns will improve ?Outcome: Progressing ?  ?Problem: Coping: ?Goal: Ability to verbalize frustrations and anger appropriately will improve ?Outcome: Progressing ?Goal: Ability to demonstrate self-control will improve ?Outcome: Progressing ?  ?Problem: Health Behavior/Discharge Planning: ?Goal: Identification of resources available to assist in meeting health care needs will improve ?Outcome: Progressing ?Goal: Compliance with treatment plan for underlying cause of condition will improve ?Outcome: Progressing ?  ?Problem: Safety: ?Goal: Periods of time without injury will increase ?Outcome: Progressing ?  ?

## 2021-11-06 DIAGNOSIS — F1010 Alcohol abuse, uncomplicated: Secondary | ICD-10-CM | POA: Diagnosis not present

## 2021-11-06 MED ORDER — HYDROXYZINE HCL 50 MG PO TABS: 50.0000 mg | ORAL_TABLET | Freq: Four times a day (QID) | ORAL | 1 refills | Status: DC | PRN

## 2021-11-06 MED ORDER — LORATADINE 10 MG PO TABS: 10.0000 mg | ORAL_TABLET | Freq: Every day | ORAL | 1 refills | Status: DC

## 2021-11-06 MED ORDER — QUETIAPINE FUMARATE 100 MG PO TABS: 100.0000 mg | ORAL_TABLET | Freq: Every evening | ORAL | 1 refills | Status: DC | PRN

## 2021-11-06 MED ORDER — FLUTICASONE PROPIONATE 50 MCG/ACT NA SUSP: 2.0000 | Freq: Every day | NASAL | 1 refills | Status: DC

## 2021-11-06 MED ORDER — ASPIRIN 81 MG PO TBEC: 81.0000 mg | DELAYED_RELEASE_TABLET | Freq: Every day | ORAL | 1 refills | Status: DC

## 2021-11-06 MED ORDER — AMLODIPINE BESYLATE 5 MG PO TABS: 5.0000 mg | ORAL_TABLET | Freq: Every day | ORAL | 1 refills | Status: DC

## 2021-11-06 MED ORDER — NICOTINE POLACRILEX 2 MG MT GUM: 2.0000 mg | CHEWING_GUM | OROMUCOSAL | 0 refills | Status: DC | PRN

## 2021-11-06 MED ORDER — PANTOPRAZOLE SODIUM 20 MG PO TBEC: 20.0000 mg | DELAYED_RELEASE_TABLET | Freq: Every day | ORAL | 1 refills | Status: DC

## 2021-11-06 MED ORDER — IBUPROFEN 600 MG PO TABS: 600.0000 mg | ORAL_TABLET | Freq: Four times a day (QID) | ORAL | 1 refills | Status: DC | PRN

## 2021-11-06 MED ORDER — BUPROPION HCL ER (XL) 150 MG PO TB24: 150.0000 mg | ORAL_TABLET | Freq: Every day | ORAL | 1 refills | Status: DC

## 2021-11-06 MED ORDER — FLUOXETINE HCL 20 MG PO CAPS: 20.0000 mg | ORAL_CAPSULE | Freq: Every day | ORAL | 1 refills | Status: DC

## 2021-11-06 NOTE — Progress Notes (Signed)
Patient came to nurses station requesting PRN Vistaril to help with symptoms of anxiety.  Spoke briefly about his plans for discharging tomorrow and reminded staff that his phone needs to be charged in preparation for tomorrow. He denies si  hi  avh  and pain at this encounter.  He does continue to endorse having depression and anxiety, and his upcoming discharge has his symptoms elevated. He received his PRN medication and tolerated without incident. Patient reports having an overall good day today.  He has been active on the unit and gets along well with the other patients as many of them gather to watch the [employer] games.  Will continue to monitor with safety checks and encourage him to seek staff with any concerns.   ? ? ? ?C Butler-Nicholson, LPN  ?

## 2021-11-06 NOTE — Progress Notes (Signed)
Recreation Therapy Notes ? ?Date: 11/06/2021 ? ?Time: 10:40am   ? ?Location: Craft room   ? ?Behavioral response: Appropriate ? ?Intervention Topic: Goals  ? ?Discussion/Intervention:  ?Group content on today was focused on goals. Patients described what goals are and how they define goals. Individuals expressed how they go about setting goals and reaching them. The group identified how important goals are and if they make short term goals to reach long term goals. Patients described how many goals they work on at a time and what affects them not reaching their goal. Individuals described how much time they put into planning and obtaining their goals. The group participated in the intervention ?My Goal Board? and made personal goal boards to help them achieve their goal. ?Clinical Observations/Feedback: ?Patient came to group expressed that his goal is to never drink again. Participant expressed that goals are made based off of situations. Individual was social with peers and staff while participating in the intervention.    ?Jameika Kinn LRT/CTRS  ? ? ? ? ? ? ? ?Janney Priego ?11/06/2021 12:07 PM ?

## 2021-11-06 NOTE — Plan of Care (Signed)
?  Problem: Group Participation ?Goal: STG - Patient will engage in groups without prompting or encouragement from LRT x3 group sessions within 5 recreation therapy group sessions ?Description: STG - Patient will engage in groups without prompting or encouragement from LRT x3 group sessions within 5 recreation therapy group sessions ?Outcome: Completed/Met ?  ?

## 2021-11-06 NOTE — Discharge Summary (Signed)
Physician Discharge Summary Note  Patient:  Lee Sparks is an 51 y.o., male MRN:  [id number] DOB:  23-May-1971 Patient phone:  [phone] (home)  Patient address:   [place] [address],  Total Time spent with patient: 30 minutes  Date of Admission:  10/29/2021 Date of Discharge: 11/06/2021  Reason for Admission: Admitted with acute intoxication, polysubstance abuse, depression with suicidal ideation and severe anxiety  Principal Problem: Alcohol abuse Discharge Diagnoses: Principal Problem:   Alcohol abuse Active Problems:   Hypertension   Severe recurrent major depression without psychotic features (HCC)   Past Psychiatric History: History of chronic substance abuse and depression with previous suicidal ideation and a history of requiring medical detox from alcohol.  Past Medical History:  Past Medical History:  Diagnosis Date   Alcohol-induced pancreatitis    Anemia    Asthma    Bronchitis     Past Surgical History:  Procedure Laterality Date   CYSTECTOMY     ESOPHAGOGASTRODUODENOSCOPY  07/28/2011   Procedure: ESOPHAGOGASTRODUODENOSCOPY (EGD);  Surgeon: Freddy Jaksch, MD;  Location: [facility] ENDOSCOPY;  Service: Endoscopy;  Laterality: N/A;   HERNIA REPAIR     Family History:  Family History  Problem Relation Age of Onset   Hypertension Father    Aneurysm Father    Cancer Other    Hypertension Mother    Pneumonia Mother    Family Psychiatric  History: See previous notes Social History:  Social History   Substance and Sexual Activity  Alcohol Use Yes   Alcohol/week: 50.0 standard drinks   Types: 50 Cans of beer per week   Comment: Equivalent of two 40 oz beers daily.     Social History   Substance and Sexual Activity  Drug Use No    Social History   Socioeconomic History   Marital status: Single    Spouse name: Not on file   Number of children: Not on file   Years of education: Not on file   Highest education level: Not on file   Occupational History   Not on file  Tobacco Use   Smoking status: Every Day    Packs/day: 0.50    Years: 15.00    Pack years: 7.50    Types: Cigarettes    Last attempt to quit: 06/27/2011    Years since quitting: 10.3   Smokeless tobacco: Never  Substance and Sexual Activity   Alcohol use: Yes    Alcohol/week: 50.0 standard drinks    Types: 50 Cans of beer per week    Comment: Equivalent of two 40 oz beers daily.   Drug use: No   Sexual activity: Not Currently  Other Topics Concern   Not on file  Social History Narrative   Not on file   Social Determinants of Health   Financial Resource Strain: Not on file  Food Insecurity: Not on file  Transportation Needs: Not on file  Physical Activity: Not on file  Stress: Not on file  Social Connections: Not on file    Hospital Course: Patient admitted to psychiatric unit.  15-minute checks were continued.  Patient did not display dangerous aggressive or suicidal behavior.  Continued to endorse negative mood and passive suicidal thoughts but was cooperative with treatment.  Did not have any delirium or seizures but had some shakiness and symptomatic withdrawal managed with CIWA orders.  Patient was restarted on medicines for depression and anxiety and has tolerated those well.  At the time of discharge he reports that his  mood is feeling much better and he is physically stable.  Denies any suicidal thought.  States he has a plan to stay with his pastor temporarily while he seeks further rehabilitation treatment and follows up with outpatient psychiatric treatment.  Multiple episodes of supportive counseling and therapy and psychoeducation has been completed.  Physical Findings: AIMS:  , ,  ,  ,    CIWA:  CIWA-Ar Total: 4 COWS:     Musculoskeletal: Strength & Muscle Tone: within normal limits Gait & Station: normal Patient leans: N/A   Psychiatric Specialty Exam:  Presentation  General Appearance: Appropriate for  Environment  Eye Contact:Good  Speech:Clear and Coherent  Speech Volume:Normal  Handedness:Right   Mood and Affect  Mood:Depressed; Hopeless  Affect:Depressed; Flat; Tearful   Thought Process  Thought Processes:Goal Directed  Descriptions of Associations:Intact  Orientation:Full (Time, Place and Person)  Thought Content:Logical  History of Schizophrenia/Schizoaffective disorder:No  Duration of Psychotic Symptoms:No data recorded Hallucinations:No data recorded Ideas of Reference:None  Suicidal Thoughts:No data recorded Homicidal Thoughts:No data recorded  Sensorium  Memory:Immediate Fair; Recent Fair; Remote Fair  Judgment:Fair  Insight:Fair   Executive Functions  Concentration:Fair  Attention Span:Fair  Recall:Fair  Fund of Knowledge:Good  Language:Fair   Psychomotor Activity  Psychomotor Activity:No data recorded  Assets  Assets:Communication Skills; Desire for Improvement; Physical Health; Resilience; Social Support   Sleep  Sleep:No data recorded   Physical Exam: Physical Exam Constitutional:      Appearance: Normal appearance.  HENT:     Head: Normocephalic and atraumatic.     Mouth/Throat:     Pharynx: Oropharynx is clear.  Eyes:     Pupils: Pupils are equal, round, and reactive to light.  Cardiovascular:     Rate and Rhythm: Normal rate and regular rhythm.  Pulmonary:     Effort: Pulmonary effort is normal.     Breath sounds: Normal breath sounds.  Abdominal:     General: Abdomen is flat.     Palpations: Abdomen is soft.  Musculoskeletal:        General: Normal range of motion.  Skin:    General: Skin is warm and dry.  Neurological:     General: No focal deficit present.     Mental Status: He is alert. Mental status is at baseline.  Psychiatric:        Attention and Perception: Attention normal.        Mood and Affect: Mood normal.        Speech: Speech normal.        Behavior: Behavior is cooperative.         Thought Content: Thought content normal.        Cognition and Memory: Cognition normal.        Judgment: Judgment normal.   Review of Systems  Constitutional: Negative.   HENT: Negative.    Eyes: Negative.   Respiratory: Negative.    Cardiovascular: Negative.   Gastrointestinal: Negative.   Musculoskeletal: Negative.   Skin: Negative.   Neurological: Negative.   Psychiatric/Behavioral:  Negative for depression, hallucinations, memory loss, substance abuse and suicidal ideas. The patient is nervous/anxious. The patient does not have insomnia.   Blood pressure 119/80, pulse (!) 115, temperature 97.7 F (36.5 C), temperature source Oral, resp. rate 17, height 5\' 9"  (1.753 m), weight 79.8 kg, SpO2 99 %. Body mass index is 25.99 kg/m.   Social History   Tobacco Use  Smoking Status Every Day   Packs/day: 0.50   Years: 15.00  Pack years: 7.50   Types: Cigarettes   Last attempt to quit: 06/27/2011   Years since quitting: 10.3  Smokeless Tobacco Never   Tobacco Cessation:  A prescription for an FDA-approved tobacco cessation medication provided at discharge   Blood Alcohol level:  Lab Results  Component Value Date   ETH 108 (H) 10/29/2021   ETH 440 (HH) 10/28/2021    Metabolic Disorder Labs:  Lab Results  Component Value Date   HGBA1C 5.7 (H) 05/30/2020   No results found for: PROLACTIN Lab Results  Component Value Date   CHOL 206 (H) 05/30/2020   TRIG 187 (H) 05/30/2020   HDL 28 (L) 05/30/2020   CHOLHDL 7.4 (H) 05/30/2020   LDLCALC 144 (H) 05/30/2020   LDLCALC 98 06/28/2013    See Psychiatric Specialty Exam and Suicide Risk Assessment completed by Attending Physician prior to discharge.  Discharge destination:  Home  Is patient on multiple antipsychotic therapies at discharge:  No   Has Patient had three or more failed trials of antipsychotic monotherapy by history:  No  Recommended Plan for Multiple Antipsychotic Therapies: NA  Discharge Instructions      Diet - low sodium heart healthy   Complete by: As directed    Increase activity slowly   Complete by: As directed       Allergies as of 11/06/2021       Reactions   Grapefruit Concentrate Hives   Grapefruit Diet Or [extra Strength Grapefruit] Hives   Pt states he has a reaction to grapefruit.         Medication List     STOP taking these medications    mirtazapine 45 MG tablet Commonly known as: REMERON   naltrexone 50 MG tablet Commonly known as: DEPADE   thiamine 100 MG tablet Commonly known as: Vitamin B-1       TAKE these medications      Indication  amLODipine 5 MG tablet Commonly known as: NORVASC Take 1 tablet (5 mg total) by mouth daily.  Indication: High Blood Pressure Disorder   aspirin 81 MG EC tablet Take 1 tablet (81 mg total) by mouth daily. Swallow whole.  Indication: Stable Angina Pectoris   buPROPion 150 MG 24 hr tablet Commonly known as: WELLBUTRIN XL Take 1 tablet (150 mg total) by mouth daily.  Indication: Major Depressive Disorder   FLUoxetine 20 MG capsule Commonly known as: PROZAC Take 1 capsule (20 mg total) by mouth daily.  Indication: Depression   fluticasone 50 MCG/ACT nasal spray Commonly known as: FLONASE Place 2 sprays into both nostrils daily.  Indication: Nonallergic Rhinitis   hydrOXYzine 50 MG tablet Commonly known as: ATARAX Take 1 tablet (50 mg total) by mouth every 6 (six) hours as needed for anxiety (sleep).  Indication: Feeling Anxious   ibuprofen 600 MG tablet Commonly known as: ADVIL Take 1 tablet (600 mg total) by mouth every 6 (six) hours as needed for mild pain, moderate pain or headache.  Indication: Pain   loratadine 10 MG tablet Commonly known as: CLARITIN Take 1 tablet (10 mg total) by mouth daily.  Indication: Perennial Allergic Rhinitis   nicotine polacrilex 2 MG gum Commonly known as: NICORETTE Take 1 each (2 mg total) by mouth as needed for smoking cessation.  Indication: Nicotine  Addiction   pantoprazole 20 MG tablet Commonly known as: PROTONIX Take 1 tablet (20 mg total) by mouth daily.  Indication: Gastroesophageal Reflux Disease   QUEtiapine 100 MG tablet Commonly known as: SEROQUEL Take 1  tablet (100 mg total) by mouth at bedtime as needed (sleep).  Indication: Major Depressive Disorder        Follow-up Information     [facility] on 11/11/2021.   Why: Pleaes present for scheduled appointment on 11 November 2021 at 1000AM. Contact information: [address] [place] [address] [phone]                 Follow-up recommendations: Follow up with our [facility] services and with an ongoing search for appropriate rehab or other substance abuse treatment  Comments: Prescriptions provided as well as a 10-day supply  Signed: Mordecai Rasmussen, MD 11/06/2021, 10:02 AM

## 2021-11-06 NOTE — [facility]HH Suicide Risk Assessment (Signed)
[facility] Discharge Suicide Risk Assessment ? ? ?Principal Problem: Alcohol abuse ?Discharge Diagnoses: Principal Problem: ?  Alcohol abuse ?Active Problems: ?  Hypertension ?  Severe recurrent major depression without psychotic features (HCC) ? ? ?Total Time spent with patient: 30 minutes ? ?Musculoskeletal: ?Strength & Muscle Tone: within normal limits ?Gait & Station: normal ?Patient leans: N/A ? ?Psychiatric Specialty Exam ? ?Presentation  ?General Appearance: Appropriate for Environment ? ?Eye Contact:Good ? ?Speech:Clear and Coherent ? ?Speech Volume:Normal ? ?Handedness:Right ? ? ?Mood and Affect  ?Mood:Depressed; Hopeless ? ?Duration of Depression Symptoms: Greater than two weeks ? ?Affect:Depressed; Flat; Tearful ? ? ?Thought Process  ?Thought Processes:Goal Directed ? ?Descriptions of Associations:Intact ? ?Orientation:Full (Time, Place and Person) ? ?Thought Content:Logical ? ?History of Schizophrenia/Schizoaffective disorder:No ? ?Duration of Psychotic Symptoms:No data recorded ?Hallucinations:No data recorded ?Ideas of Reference:None ? ?Suicidal Thoughts:No data recorded ?Homicidal Thoughts:No data recorded ? ?Sensorium  ?Memory:Immediate Fair; Recent Fair; Remote Fair ? ?Judgment:Fair ? ?Insight:Fair ? ? ?Executive Functions  ?Concentration:Fair ? ?Attention Span:Fair ? ?Recall:Fair ? ?Fund of Knowledge:Good ? ?Language:Fair ? ? ?Psychomotor Activity  ?Psychomotor Activity:No data recorded ? ?Assets  ?Assets:Communication Skills; Desire for Improvement; Physical Health; Resilience; Social Support ? ? ?Sleep  ?Sleep:No data recorded ? ?Physical Exam: ?Physical Exam ?Vitals and nursing note reviewed.  ?Constitutional:   ?   Appearance: Normal appearance.  ?HENT:  ?   Head: Normocephalic and atraumatic.  ?   Mouth/Throat:  ?   Pharynx: Oropharynx is clear.  ?Eyes:  ?   Pupils: Pupils are equal, round, and reactive to light.  ?Cardiovascular:  ?   Rate and Rhythm: Normal rate and regular rhythm.  ?Pulmonary:  ?    Effort: Pulmonary effort is normal.  ?   Breath sounds: Normal breath sounds.  ?Abdominal:  ?   General: Abdomen is flat.  ?   Palpations: Abdomen is soft.  ?Musculoskeletal:     ?   General: Normal range of motion.  ?Skin: ?   General: Skin is warm and dry.  ?Neurological:  ?   General: No focal deficit present.  ?   Mental Status: He is alert. Mental status is at baseline.  ?Psychiatric:     ?   Attention and Perception: Attention normal.     ?   Mood and Affect: Mood is anxious. Mood is not depressed.     ?   Speech: Speech normal.     ?   Behavior: Behavior is cooperative.     ?   Thought Content: Thought content normal. Thought content does not include suicidal ideation.     ?   Cognition and Memory: Cognition normal.     ?   Judgment: Judgment normal.  ? ?Review of Systems  ?Constitutional: Negative.   ?HENT: Negative.    ?Eyes: Negative.   ?Respiratory: Negative.    ?Cardiovascular: Negative.   ?Gastrointestinal: Negative.   ?Musculoskeletal: Negative.   ?Skin: Negative.   ?Neurological: Negative.   ?Psychiatric/Behavioral: Negative.    ?Blood pressure 119/80, pulse (!) 115, temperature 97.7 ?F (36.5 ?C), temperature source Oral, resp. rate 17, height 5\' 9"  (1.753 m), weight 79.8 kg, SpO2 99 %. Body mass index is 25.99 kg/m?. ? ?Mental Status Per Nursing Assessment::   ?On Admission:  Suicidal ideation indicated by patient, Self-harm thoughts ? ?Demographic Factors:  ?Male, Divorced or widowed, Caucasian, and Unemployed ? ?Loss Factors: ?Financial problems/change in socioeconomic status ? ?Historical Factors: ?Impulsivity ? ?Risk Reduction Factors:   ?Religious beliefs about death, Living  with another person, especially a relative, Positive social support, and Positive therapeutic relationship ? ?Continued Clinical Symptoms:  ?Depression:   Comorbid alcohol abuse/dependence ?Alcohol/Substance Abuse/Dependencies ? ?Cognitive Features That Contribute To Risk:  ?None   ? ?Suicide Risk:  ?Minimal: No  identifiable suicidal ideation.  Patients presenting with no risk factors but with morbid ruminations; may be classified as minimal risk based on the severity of the depressive symptoms ? ? ? ?Plan Of Care/Follow-up recommendations:  ?Continue medication and follow up local mental health care and sobriety. Denies all SI ? ?Mordecai Rasmussen, MD ?11/06/2021, 9:12 AM ?

## 2021-11-06 NOTE — Plan of Care (Signed)
D: Pt alert and oriented. Pt rates depression 7/10, hopelessness 7/10, and anxiety 7/10. Pt goal: "Leaving at 10:30." Pt reports energy level as normal and concentration as being good. Pt reports sleep last night as being fair. Pt did receive medications for sleep and did find them helpful. Pt denies experiencing any pain at this time. Pt denies experiencing any SI/HI, or AVH at this time.  ? ?A: Scheduled medications administered to pt, per MD orders. Support and encouragement provided. Frequent verbal contact made. Routine safety checks conducted q15 minutes.  ? ?R: No adverse drug reactions noted. Pt verbally contracts for safety at this time. Pt compliant with medications and treatment plan. Pt interacts well with others on the unit. Pt remains safe at this time. Will continue to monitor. ?  ?Problem: Education: ?Goal: Emotional status will improve ?Outcome: Progressing ?Goal: Mental status will improve ?Outcome: Progressing ?  ?

## 2021-11-06 NOTE — Progress Notes (Signed)
Recreation Therapy Notes ? ?INPATIENT RECREATION TR PLAN ? ?Patient Details ?Name: Lee Sparks ?MRN: [id number] ?DOB: 03-29-1971 ?Today's Date: 11/06/2021 ? ?Rec Therapy Plan ?Is patient appropriate for Therapeutic Recreation?: Yes ?Treatment times per week: at least 3 ?Estimated Length of Stay: 5-7 days ?TR Treatment/Interventions: Group participation (Comment) ? ?Discharge Criteria ?Pt will be discharged from therapy if:: Discharged ?Treatment plan/goals/alternatives discussed and agreed upon by:: Patient/family ? ?Discharge Summary ?Short term goals set: Patient will engage in groups without prompting or encouragement from LRT x3 group sessions within 5 recreation therapy group sessions ?Short term goals met: Complete ?Progress toward goals comments: Groups attended ?Which groups?: Goal setting, Social skills, Stress management ?Reason goals not met: N/A ?Therapeutic equipment acquired: N/A ?Reason patient discharged from therapy: Discharge from hospital ?Pt/family agrees with progress & goals achieved: Yes ?Date patient discharged from therapy: 11/06/21 ? ? ?Brynne Doane ?11/06/2021, 12:15 PM ?

## 2021-11-06 NOTE — Progress Notes (Signed)
D: Pt alert and oriented. Pt denies experiencing any pain, SI/HI, or AVH at this time. Pt reports he will be able to keep himself safe when he returns home.  ? ?A: Pt received discharge and medication education/information. Pt belongings were returned and signed for at this time, include valuables from the safe, printed prescriptions, and medication supply.  ? ?R: Pt verbalized understanding of discharge and medication education/information. ? ?Pt escorted by staff to medical mall front lobby where pt was picked up by Darl Householder.  ?

## 2021-11-06 NOTE — Progress Notes (Signed)
?  [facility] Adult Case Management Discharge Plan : ? ?Will you be returning to the same living situation after discharge:  No. Patient's pastor has found church for the patient to [facility] at. ? ?At discharge, do you have transportation home?: Yes,  Patient's pastor to assist with transportation. ? ?Do you have the ability to pay for your medications: No. ? ?Release of information consent forms completed and in the chart;  Patient's signature needed at discharge. ? ?Patient to Follow up at: ? ? Follow-up Information   ? ? [place] on 11/11/2021.   ?Why: Pleaes present for scheduled appointment on 11 November 2021 at 1000AM. ?Contact information: ?[address] ?[place] [address] ?[phone] ? ? ?  ?  ? ?  ?  ? ?  ?  ? ?Next level of care provider has access to [facility] ? ?Safety Planning and Suicide Prevention discussed: Yes,  SPE completed with patient and Wallene Huh, pastor.  ?  ?Has patient been referred to the Quitline?: Yes, faxed on 11/06/2021 ?Tobacco Use: High Risk  ? Smoking Tobacco Use: Every Day  ? Smokeless Tobacco Use: Never  ? Passive Exposure: Not on file  ? ? ?Patient has been referred for addiction treatment: Yes Patient referred to [facility] for outpatient mental health and substance use treatment.  ? ?Durenda Hurt, LCSWA ?11/06/2021, 9:02 AM ?

## 2022-03-13 ENCOUNTER — Encounter: Payer: Self-pay | Admitting: Emergency Medicine

## 2022-03-13 ENCOUNTER — Emergency Department
Admission: EM | Admit: 2022-03-13 | Discharge: 2022-03-15 | Disposition: A | Discharge status: Other Acute Inpatient Hospital | Payer: 59 | Attending: Emergency Medicine | Admitting: Emergency Medicine

## 2022-03-13 ENCOUNTER — Other Ambulatory Visit: Payer: Self-pay

## 2022-03-13 DIAGNOSIS — Z20822 Contact with and (suspected) exposure to covid-19: Secondary | ICD-10-CM | POA: Insufficient documentation

## 2022-03-13 DIAGNOSIS — F32A Depression, unspecified: Secondary | ICD-10-CM

## 2022-03-13 DIAGNOSIS — F1020 Alcohol dependence, uncomplicated: Secondary | ICD-10-CM | POA: Diagnosis not present

## 2022-03-13 DIAGNOSIS — J45909 Unspecified asthma, uncomplicated: Secondary | ICD-10-CM | POA: Insufficient documentation

## 2022-03-13 DIAGNOSIS — R45851 Suicidal ideations: Secondary | ICD-10-CM | POA: Insufficient documentation

## 2022-03-13 DIAGNOSIS — I10 Essential (primary) hypertension: Secondary | ICD-10-CM | POA: Insufficient documentation

## 2022-03-13 DIAGNOSIS — F332 Major depressive disorder, recurrent severe without psychotic features: Secondary | ICD-10-CM | POA: Diagnosis present

## 2022-03-13 LAB — URINE DRUG SCREEN, QUALITATIVE (ARMC ONLY)
Amphetamines, Ur Screen: NOT DETECTED
Barbiturates, Ur Screen: NOT DETECTED
Benzodiazepine, Ur Scrn: NOT DETECTED
Cannabinoid 50 Ng, Ur ~~LOC~~: NOT DETECTED
Cocaine Metabolite,Ur ~~LOC~~: NOT DETECTED
MDMA (Ecstasy)Ur Screen: NOT DETECTED
Methadone Scn, Ur: NOT DETECTED
Opiate, Ur Screen: NOT DETECTED
Phencyclidine (PCP) Ur S: NOT DETECTED
Tricyclic, Ur Screen: NOT DETECTED

## 2022-03-13 LAB — RESP PANEL BY RT-PCR (FLU A&B, COVID) ARPGX2
Influenza A by PCR: NEGATIVE
Influenza B by PCR: NEGATIVE
SARS Coronavirus 2 by RT PCR: NEGATIVE

## 2022-03-13 LAB — COMPREHENSIVE METABOLIC PANEL
ALT: 48 U/L — ABNORMAL HIGH (ref 0–44)
AST: 52 U/L — ABNORMAL HIGH (ref 15–41)
Albumin: 4.7 g/dL (ref 3.5–5.0)
Alkaline Phosphatase: 44 U/L (ref 38–126)
Anion gap: 11 (ref 5–15)
BUN: 13 mg/dL (ref 6–20)
CO2: 22 mmol/L (ref 22–32)
Calcium: 9.7 mg/dL (ref 8.9–10.3)
Chloride: 103 mmol/L (ref 98–111)
Creatinine, Ser: 0.71 mg/dL (ref 0.61–1.24)
GFR, Estimated: >60 mL/min (ref >60)
Glucose, Bld: 116 mg/dL — ABNORMAL HIGH (ref 70–99)
Potassium: 3.8 mmol/L (ref 3.5–5.1)
Sodium: 136 mmol/L (ref 135–145)
Total Bilirubin: 1.3 mg/dL — ABNORMAL HIGH (ref 0.3–1.2)
Total Protein: 8.1 g/dL (ref 6.5–8.1)

## 2022-03-13 LAB — CBC
HCT: 48.5 % (ref 39.0–52.0)
Hemoglobin: 15.9 g/dL (ref 13.0–17.0)
MCH: 29 pg (ref 26.0–34.0)
MCHC: 32.8 g/dL (ref 30.0–36.0)
MCV: 88.3 fL (ref 80.0–100.0)
Platelets: 125 10*3/uL — ABNORMAL LOW (ref 150–400)
RBC: 5.49 MIL/uL (ref 4.22–5.81)
RDW: 14.1 % (ref 11.5–15.5)
WBC: 9.2 10*3/uL (ref 4.0–10.5)
nRBC: 0 % (ref 0.0–0.2)

## 2022-03-13 LAB — ETHANOL: Alcohol, Ethyl (B): 91 mg/dL — ABNORMAL HIGH (ref <10)

## 2022-03-13 LAB — SALICYLATE LEVEL: Salicylate Lvl: <7 mg/dL — ABNORMAL LOW (ref 7.0–30.0)

## 2022-03-13 LAB — ACETAMINOPHEN LEVEL: Acetaminophen (Tylenol), Serum: <10 ug/mL — ABNORMAL LOW (ref 10–30)

## 2022-03-13 MED ORDER — LORAZEPAM 2 MG/ML IJ SOLN: 0.0000 mg | Freq: Two times a day (BID) | INTRAMUSCULAR | Status: DC

## 2022-03-13 MED ORDER — TRAZODONE HCL 100 MG PO TABS
100.0000 mg | ORAL_TABLET | Freq: Every evening | ORAL | Status: DC | PRN
  Administered 2022-03-13: 100 mg via ORAL
  Filled 2022-03-13: qty 1

## 2022-03-13 MED ORDER — THIAMINE HCL 100 MG PO TABS
100.0000 mg | ORAL_TABLET | Freq: Every day | ORAL | Status: DC
  Administered 2022-03-13 – 2022-03-14 (×2): 100 mg via ORAL
  Filled 2022-03-13 (×2): qty 1

## 2022-03-13 MED ORDER — THIAMINE HCL 100 MG/ML IJ SOLN
100.0000 mg | Freq: Every day | INTRAMUSCULAR | Status: DC
  Filled 2022-03-13: qty 1

## 2022-03-13 MED ORDER — LORAZEPAM 2 MG PO TABS: 0.0000 mg | ORAL_TABLET | Freq: Two times a day (BID) | ORAL | Status: DC

## 2022-03-13 MED ORDER — LORAZEPAM 2 MG PO TABS
0.0000 mg | ORAL_TABLET | Freq: Four times a day (QID) | ORAL | Status: DC
  Administered 2022-03-13: 1 mg via ORAL
  Administered 2022-03-13: 2 mg via ORAL
  Administered 2022-03-13 – 2022-03-14 (×2): 1 mg via ORAL
  Filled 2022-03-13 (×4): qty 1

## 2022-03-13 MED ORDER — LORAZEPAM 2 MG/ML IJ SOLN: 0.0000 mg | Freq: Four times a day (QID) | INTRAMUSCULAR | Status: DC

## 2022-03-13 NOTE — ED Notes (Addendum)
Patient belongings:  1 cell phone 1 phone charger 1 Rogelio Winbush wallet 1 pack of gum 1 hat  1 sunglasses 1 grey shirt 2 Emonni Depasquale shoes 1 underwear 1 shorts 2 [place] socks

## 2022-03-13 NOTE — ED Triage Notes (Signed)
Patient to ED for suicidal thoughts. Patient has multiple stressors at home. Patient states he drinks everyday with the last time being a 40 today. Patient states that friend stopped him from running out in front of cars last night. Patient states he has also thought about cutting himself.

## 2022-03-13 NOTE — BH Assessment (Signed)
Adult MH  Referral information for Psychiatric Hospitalization faxed to:   [facility] ([phone]-or- [phone]),   [facility] ([phone]),   [facility] ([phone] -or- [phone]),   [facility] ([phone]---[phone]---[phone]),   [facility] ([phone] or [phone])   [facility] ([phone] or [phone]),   [facility] ([phone]) 

## 2022-03-13 NOTE — ED Notes (Signed)
Meal given

## 2022-03-13 NOTE — ED Provider Notes (Signed)
[facility] Provider Note    Event Date/Time   First MD Initiated Contact with Patient 03/13/22 1211     (approximate)   History   Suicidal   HPI  Lee Sparks is a 51 y.o. male past medical history of alcohol use disorder, severe depression with psychotic features presents with suicidality and depression.  Patient tells me he has been going through a lot of social stressors at home and today he just wanted to end it all.  He was trying to run in front of traffic in his friends encouraged him to come to the emergency department.  Has been drinking drinks about 15 pack/day does have history of withdrawal but no withdrawal seizures or DTs.  Denies other medical complaints today currently     Past Medical History:  Diagnosis Date   Alcohol-induced pancreatitis    Anemia    Asthma    Bronchitis     Patient Active Problem List   Diagnosis Date Noted   Alcohol abuse 10/29/2021   Severe recurrent major depression without psychotic features (HCC) 10/31/2020   Severe alcohol use disorder (HCC) 10/31/2020   Pancreatitis 05/03/2017   Acute pancreatitis 05/01/2017   Thrombocytopenia (HCC) 07/29/2011   Cocaine abuse (HCC) 07/28/2011   Upper GI bleeding 07/27/2011   Tachycardia 07/27/2011    Class: Acute   Hypertension 07/27/2011    Class: Acute   Abdominal pain 07/27/2011    Class: Acute     Physical Exam  Triage Vital Signs: ED Triage Vitals  Enc Vitals Group     BP 03/13/22 1156 (!) 143/102     Pulse Rate 03/13/22 1156 (!) 104     Resp 03/13/22 1156 18     Temp 03/13/22 1156 99 F (37.2 C)     Temp Source 03/13/22 1156 Oral     SpO2 03/13/22 1156 91 %     Weight 03/13/22 1157 185 lb (83.9 kg)     Height 03/13/22 1157 5\' 9"  (1.753 m)     Head Circumference --      Peak Flow --      Pain Score 03/13/22 1157 0     Pain Loc --      Pain Edu? --      Excl. in GC? --     Most recent vital signs: Vitals:   03/13/22 1156  BP: (!)  143/102  Pulse: (!) 104  Resp: 18  Temp: 99 F (37.2 C)  SpO2: 91%     General: Awake, no distress.  CV:  Good peripheral perfusion.  Resp:  Normal effort.  Abd:  No distention.  Neuro:             Awake, Alert, Oriented x 3  Other:  Patient is calm and cooperative he is tearful   ED Results / Procedures / Treatments  Labs (all labs ordered are listed, but only abnormal results are displayed) Labs Reviewed  COMPREHENSIVE METABOLIC PANEL - Abnormal; Notable for the following components:      Result Value   Glucose, Bld 116 (*)    AST 52 (*)    ALT 48 (*)    Total Bilirubin 1.3 (*)    All other components within normal limits  ETHANOL - Abnormal; Notable for the following components:   Alcohol, Ethyl (B) 91 (*)    All other components within normal limits  SALICYLATE LEVEL - Abnormal; Notable for the following components:   Salicylate Lvl <7.0 (*)  All other components within normal limits  ACETAMINOPHEN LEVEL - Abnormal; Notable for the following components:   Acetaminophen (Tylenol), Serum <10 (*)    All other components within normal limits  CBC - Abnormal; Notable for the following components:   Platelets 125 (*)    All other components within normal limits  RESP PANEL BY RT-PCR (FLU A&B, COVID) [id number]  URINE DRUG SCREEN, QUALITATIVE ([facility] ONLY)     EKG     RADIOLOGY    PROCEDURES:  Critical Care performed: No  Procedures   MEDICATIONS ORDERED IN ED: Medications  LORazepam (ATIVAN) injection 0-4 mg ( Intravenous See Alternative 03/13/22 1235)    Or  LORazepam (ATIVAN) tablet 0-4 mg (1 mg Oral Given 03/13/22 1235)  LORazepam (ATIVAN) injection 0-4 mg (has no administration in time range)    Or  LORazepam (ATIVAN) tablet 0-4 mg (has no administration in time range)  thiamine (VITAMIN B1) tablet 100 mg (100 mg Oral Given 03/13/22 1235)    Or  thiamine (VITAMIN B1) injection 100 mg ( Intravenous See Alternative 03/13/22 1235)     IMPRESSION / MDM  / ASSESSMENT AND PLAN / ED COURSE  I reviewed the triage vital signs and the nursing notes.                              Patient's presentation is most consistent with acute presentation with potential threat to life or bodily function.  Differential diagnosis includes, but is not limited to, major depressive disorder, alcohol induced mood disorder, adjustment disorder  Patient is a 51 year old male with chronic alcohol use and major depression presents with suicidality.  Seems to be under quite a bit of stress due to home issues.  Has been drinking alcohol apparently wanted to run into traffic today.  He is mildly tachycardic and hypertensive.  He is tearful but calm and redirectable no medical pain concerns from him.  Labs are notable for just slight elevation in AST and ALT likely from chronic alcohol use.  T. bili 1.3.  Rest of his labs are reassuring EtOH level is 91.  I placed on CIWA.  Patient wants to be here so we will keep voluntary.  I have consulted psychiatry.  The patient has been placed in psychiatric observation due to the need to provide a safe environment for the patient while obtaining psychiatric consultation and evaluation, as well as ongoing medical and medication management to treat the patient's condition.  The patient has not been placed under full IVC at this time.        FINAL CLINICAL IMPRESSION(S) / ED DIAGNOSES   Final diagnoses:  Suicidal ideation  Depression, unspecified depression type     Rx / DC Orders   ED Discharge Orders     None        Note:  This document was prepared using Dragon voice recognition software and may include unintentional dictation errors.   Georga Hacking, MD 03/13/22 1320

## 2022-03-13 NOTE — ED Notes (Signed)
Pt given a [place] sandwich tray and a cup of ginger ale.

## 2022-03-13 NOTE — BH Assessment (Signed)
Comprehensive Clinical Assessment (CCA) Note  03/13/2022 Lee Sparks [id number]  Lee Sparks, 51 year old male who presents to [facility] ED voluntarily for treatment. Per triage note, Patient to ED for suicidal thoughts. Patient has multiple stressors at home. Patient states he drinks everyday with the last time being a 40 today. Patient states that friend stopped him from running out in front of cars last night. Patient states he has also thought about cutting himself.   During TTS assessment pt presents alert and oriented x 4, restless but cooperative, and mood-congruent with affect. The pt does not appear to be responding to internal or external stimuli. Neither is the pt presenting with any delusional thinking. Pt verified the information provided to triage RN.   Pt identifies his main complaint to be that he is having suicidal thoughts because he is depressed. Patient reports he has strained relationship with his daughter because she will not allow him to see grandchildren. Patient drinks daily, unemployed, and homeless. Patient reports he is not able to keep a job because of the alcohol. Patient was seen here at [facility] a few months ago and given resources but did not follow up. Patient states he does not have transportation. Pt denies HI/AH/VH. Pt is unable to contract for safety.    Per Sallye Ober, NP, pt is recommended for inpatient psychiatric admission.   Chief Complaint:  Chief Complaint  Patient presents with   Suicidal   Visit Diagnosis: Severe alcohol use disorder    CCA Screening, Triage and Referral (STR)  Patient Reported Information How did you hear about [place]? Family/Friend  Referral name: No data recorded Referral phone number: No data recorded  Whom do you see for routine medical problems? No data recorded Practice/Facility Name: No data recorded Practice/Facility Phone Number: No data recorded Name of Contact: No data recorded Contact Number: No data recorded Contact  Fax Number: No data recorded Prescriber Name: No data recorded Prescriber Address (if known): No data recorded  What Is the Reason for Your Visit/Call Today? Patient was brought to the ED for SI and alcohol abuse.  How Long Has This Been Causing You Problems? > than 6 months  What Do You Feel Would Help You the Most Today? Treatment for Depression or other mood problem   Have You Recently Been in Any Inpatient Treatment (Hospital/Detox/Crisis Center/28-Day Program)? No data recorded Name/Location of Program/Hospital:No data recorded How Long Were You There? No data recorded When Were You Discharged? No data recorded  Have You Ever Received Services From [facility] Before? No data recorded Who Do You See at [facility]? No data recorded  Have You Recently Had Any Thoughts About Hurting Yourself? Yes  Are You Planning to Commit Suicide/Harm Yourself At This time? No   Have you Recently Had Thoughts About Hurting Someone Karolee Ohs? No  Explanation: No data recorded  Have You Used Any Alcohol or Drugs in the Past 24 Hours? Yes  How Long Ago Did You Use Drugs or Alcohol? No data recorded What Did You Use and How Much? Alcohol/ 4-5 beers   Do You Currently Have a Therapist/Psychiatrist? No  Name of Therapist/Psychiatrist: No data recorded  Have You Been Recently Discharged From Any Office Practice or Programs? No  Explanation of Discharge From Practice/Program: No data recorded    CCA Screening Triage Referral Assessment Type of Contact: Face-to-Face  Is this Initial or Reassessment? No data recorded Date Telepsych consult ordered in [facility]:  No data recorded Time Telepsych consult ordered in  [facility]:  No data recorded  Patient Reported Information Reviewed? No data recorded Patient Left Without Being Seen? No data recorded Reason for Not Completing Assessment: No data recorded  Collateral Involvement: None provided   Does Patient Have a Court Appointed Legal Guardian? No data  recorded Name and Contact of Legal Guardian: No data recorded If Minor and Not Living with Parent(s), Who has Custody? n/a  Is CPS involved or ever been involved? Never  Is APS involved or ever been involved? Never   Patient Determined To Be At Risk for Harm To Self or Others Based on Review of Patient Reported Information or Presenting Complaint? Yes, for Self-Harm  Method: No data recorded Availability of Means: No data recorded Intent: No data recorded Notification Required: No data recorded Additional Information for Danger to Others Potential: No data recorded Additional Comments for Danger to Others Potential: No data recorded Are There Guns or Other Weapons in Your Home? No data recorded Types of Guns/Weapons: No data recorded Are These Weapons Safely Secured?                            No data recorded Who Could Verify You Are Able To Have These Secured: No data recorded Do You Have any Outstanding Charges, Pending Court Dates, Parole/Probation? No data recorded Contacted To Inform of Risk of Harm To Self or Others: No data recorded  Location of Assessment: [facility] ED   Does Patient Present under Involuntary Commitment? No  IVC Papers Initial File Date: No data recorded  [place] of Residence: [place]   Patient Currently Receiving the Following Services: Not Receiving Services   Determination of Need: Emergent (2 hours)   Options For Referral: ED Visit; Inpatient Hospitalization     CCA Biopsychosocial Intake/Chief Complaint:  No data recorded Current Symptoms/Problems: No data recorded  Patient Reported Schizophrenia/Schizoaffective Diagnosis in Past: No   Strengths: Patient is able to communicate and verbalize needs.  Preferences: No data recorded Abilities: No data recorded  Type of Services Patient Feels are Needed: No data recorded  Initial Clinical Notes/Concerns: No data recorded  Mental Health Symptoms Depression:   Hopelessness;  Worthlessness   Duration of Depressive symptoms:  Greater than two weeks   Mania:   None   Anxiety:    Worrying   Psychosis:   None   Duration of Psychotic symptoms: No data recorded  Trauma:   None   Obsessions:   None   Compulsions:   None   Inattention:   None   Hyperactivity/Impulsivity:   N/A   Oppositional/Defiant Behaviors:   None   Emotional Irregularity:   None   Other Mood/Personality Symptoms:  No data recorded   Mental Status Exam Appearance and self-care  Stature:   Average   Weight:   Average weight   Clothing:   Casual   Grooming:   Normal   Cosmetic use:   None   Posture/gait:   Normal   Motor activity:   Not Remarkable   Sensorium  Attention:   Normal   Concentration:   Normal   Orientation:   X5   Recall/memory:   Normal   Affect and Mood  Affect:   Appropriate   Mood:   Worthless   Relating  Eye contact:   Normal   Facial expression:   Responsive   Attitude toward examiner:   Cooperative   Thought and Language  Speech flow:  Clear and Coherent  Thought content:   Appropriate to Mood and Circumstances   Preoccupation:   None   Hallucinations:   None   Organization:  No data recorded  [employer] of Knowledge:   Fair   Intelligence:   Average   Abstraction:   Normal   Judgement:   Fair   Programmer, systems   Insight:   Fair   Decision Making:   Normal   Social Functioning  Social Maturity:   Responsible   Social Judgement:   Victimized   Stress  Stressors:   Housing; Surveyor, quantity; Family conflict   Coping Ability:   Normal   Skill Deficits:   Responsibility   Supports:   Support needed     Religion:    Leisure/Recreation:    Exercise/Diet: Exercise/Diet Do You Have Any Trouble Sleeping?: No   CCA Employment/Education Employment/Work Situation: Employment / Work Situation Employment Situation: Unemployed Patient's Job  has Been Impacted by Current Illness: Yes Describe how Patient's Job has Been Impacted: Patient reports alcohol use is affecting his job Has Patient ever Been in Equities trader?: No  Education: Education Is Patient Currently Attending School?: No   CCA Family/Childhood History Family and Relationship History: Family history Does patient have children?: Yes How many children?: 1 How is patient's relationship with their children?: Strained relationshop with daughter. She will not allow patient to see grandchildren due to his substance use.  Childhood History:     Child/Adolescent Assessment:     CCA Substance Use Alcohol/Drug Use: Alcohol / Drug Use Pain Medications: See MAR Prescriptions: See MAR Over the Counter: See MAR Longest period of sobriety (when/how long): Unknown Substance #1 Name of Substance 1: Alcohol 1 - Age of First Use: 51 years old 1 - Frequency: daily 1 - Last Use / Amount: 03/13/22 1- Route of Use: oral                       ASAM's:  Six Dimensions of Multidimensional Assessment  Dimension 1:  Acute Intoxication and/or Withdrawal Potential:      Dimension 2:  Biomedical Conditions and Complications:      Dimension 3:  Emotional, Behavioral, or Cognitive Conditions and Complications:     Dimension 4:  Readiness to Change:     Dimension 5:  Relapse, Continued use, or Continued Problem Potential:     Dimension 6:  Recovery/Living Environment:     ASAM Severity Score:    ASAM Recommended Level of Treatment:     Substance use Disorder (SUD) Substance Use Disorder (SUD)  Checklist Symptoms of Substance Use: Continued use despite having a persistent/recurrent physical/psychological problem caused/exacerbated by use, Evidence of tolerance, Presence of craving or strong urge to use, Social, occupational, recreational activities given up or reduced due to use, Evidence of withdrawal (Comment), Large amounts of time spent to obtain, use or recover  from the substance(s), Recurrent use that results in a failure to fulfill major role obligations (work, school, home), Substance(s) often taken in larger amounts or over longer times than was intended, Continued use despite persistent or recurrent social, interpersonal problems, caused or exacerbated by use, Persistent desire or unsuccessful efforts to cut down or control use, Repeated use in physically hazardous situations  Recommendations for Services/Supports/Treatments:    DSM5 Diagnoses: Patient Active Problem List   Diagnosis Date Noted   Alcohol abuse 10/29/2021   Severe recurrent major depression without psychotic features (HCC) 10/31/2020   Severe alcohol use disorder (HCC)  10/31/2020   Pancreatitis 05/03/2017   Acute pancreatitis 05/01/2017   Thrombocytopenia (HCC) 07/29/2011   Cocaine abuse (HCC) 07/28/2011   Upper GI bleeding 07/27/2011   Tachycardia 07/27/2011    Class: Acute   Hypertension 07/27/2011    Class: Acute   Abdominal pain 07/27/2011    Class: Acute    Patient Centered Plan: Patient is on the following Treatment Plan(s):  Depression and Substance Abuse   Referrals to Alternative Service(s): Referred to Alternative Service(s):   Place:   Date:   Time:    Referred to Alternative Service(s):   Place:   Date:   Time:    Referred to Alternative Service(s):   Place:   Date:   Time:    Referred to Alternative Service(s):   Place:   Date:   Time:      @BHCOLLABOFCARE @  Georgia Baria R , Counselor, LCAS-A

## 2022-03-13 NOTE — ED Notes (Signed)
Vol pending inpatient per NP

## 2022-03-13 NOTE — Consult Note (Signed)
[facility]   Reason for Consult: Suicidal ideation Referring Physician: Sidney Sparks Patient Identification: Lee Sparks MRN:  [id number] Principal Diagnosis: Severe alcohol use disorder (HCC) Diagnosis:  Principal Problem:   Severe alcohol use disorder (HCC) Active Problems:   Severe recurrent major depression without psychotic features (HCC)   Total Time spent with patient: 45 minutes  Subjective:   Lee Sparks is a 51 y.o. male patient admitted with suicidal ideation and intoxication.  HPI: Patient has history of alcohol abuse.  He has been admitted to this facility several times for depression and alcohol use.  He presents to the ED with BAL of 91.   On evaluation, patient is alert and oriented x 3.  He states that he has suicidal.  Patient reports that he has a very long history of alcohol abuse, never having more than 5 to 6 months of sobriety at a time.  He voices that he has increased stressors lately, mainly problems in relationship with his daughter.  This has caused him to have a worsening depression and suicidal thoughts.  Patient reports that he stepped out in front of traffic last evening, trying to get a truck to hit him.  Patient denies homicidal ideation, paranoia, auditory or visual hallucinations.  Collateral from his pastor, Lee Sparks, [phone]: Lee Sparks reports that he did [facility] because he is very concerned about patient. Ricky witnessed patient stepping out in front of any large track in the truck had to serve swerve sharply to avoid him.  Past Psychiatric History: Alcohol use disorder, major depressive disorder  Risk to Self:   Risk to Others:   Prior Inpatient Therapy:   Prior Outpatient Therapy:    Past Medical History:  Past Medical History:  Diagnosis Date   Alcohol-induced pancreatitis    Anemia    Asthma    Bronchitis     Past Surgical History:  Procedure Laterality Date   CYSTECTOMY      ESOPHAGOGASTRODUODENOSCOPY  07/28/2011   Procedure: ESOPHAGOGASTRODUODENOSCOPY (EGD);  Surgeon: Freddy JakschWilliam M Outlaw, MD;  [facility];  Service: Endoscopy;  Laterality: N/A;   HERNIA REPAIR     Family History:  Family History  Problem Relation Age of Onset   Hypertension Father    Aneurysm Father    Cancer Other    Hypertension Mother    Pneumonia Mother    Family Psychiatric  History:  Social History:  Social History   Substance and Sexual Activity  Alcohol Use Yes   Alcohol/week: 50.0 standard drinks of alcohol   Types: 50 Cans of beer per week   Comment: Equivalent of two 40 oz beers daily.     Social History   Substance and Sexual Activity  Drug Use No    Social History   Socioeconomic History   Marital status: Single    Spouse name: Not on file   Number of children: Not on file   Years of education: Not on file   Highest education level: Not on file  Occupational History   Not on file  Tobacco Use   Smoking status: Every Day    Packs/day: 0.50    Years: 15.00    Total pack years: 7.50    Types: Cigarettes    Last attempt to quit: 06/27/2011    Years since quitting: 10.7   Smokeless tobacco: Never  Substance and Sexual Activity   Alcohol use: Yes    Alcohol/week: 50.0 standard drinks of alcohol  Types: 50 Cans of beer per week    Comment: Equivalent of two 40 oz beers daily.   Drug use: No   Sexual activity: Not Currently  Other Topics Concern   Not on file  Social History Narrative   Not on file   Social Determinants of Health   Financial Resource Strain: Not on file  Food Insecurity: Not on file  Transportation Needs: Not on file  Physical Activity: Not on file  Stress: Not on file  Social Connections: Not on file   Additional Social History:    Allergies:   Allergies  Allergen Reactions   Grapefruit Concentrate Hives   Grapefruit Diet Or [Extra Strength Grapefruit] Hives    Pt states he has a reaction to grapefruit.      Labs:  Results for orders placed or performed during the hospital encounter of 03/13/22 (from the past 48 hour(s))  Resp Panel by RT-PCR (Flu A&B, Covid) Anterior Nasal Swab     Status: None   Collection Time: 03/13/22 11:23 AM   Specimen: Anterior Nasal Swab  Result Value Ref Range   SARS Coronavirus 2 by RT PCR NEGATIVE NEGATIVE    Comment: (NOTE) SARS-CoV-2 target nucleic acids are NOT DETECTED.  The SARS-CoV-2 RNA is generally detectable in upper respiratory specimens during the acute phase of infection. The lowest concentration of SARS-CoV-2 viral copies this assay can detect is 138 copies/mL. A negative result does not preclude SARS-Cov-2 infection and should not be used as the sole basis for treatment or other patient management decisions. A negative result may occur with  improper specimen collection/handling, submission of specimen other than nasopharyngeal swab, presence of viral mutation(s) within the areas targeted by this assay, and inadequate number of viral copies(<138 copies/mL). A negative result must be combined with clinical observations, patient history, and epidemiological information. The expected result is Negative.  Fact Sheet for Patients:  BloggerCourse.com  Fact Sheet for Healthcare Providers:  SeriousBroker.it  This test is no t yet approved or cleared by the [place] FDA and  has been authorized for detection and/or diagnosis of SARS-CoV-2 by FDA under an Emergency Use Authorization (EUA). This EUA will remain  in effect (meaning this test can be used) for the duration of the COVID-19 declaration under Section 564(b)(1) of the Act, 21 U.S.C.section 360bbb-3(b)(1), unless the authorization is terminated  or revoked sooner.       Influenza A by PCR NEGATIVE NEGATIVE   Influenza B by PCR NEGATIVE NEGATIVE    Comment: (NOTE) The Xpert Xpress SARS-CoV-2/FLU/RSV plus assay is intended as an  aid in the diagnosis of influenza from Nasopharyngeal swab specimens and should not be used as a sole basis for treatment. Nasal washings and aspirates are unacceptable for Xpert Xpress SARS-CoV-2/FLU/RSV testing.  Fact Sheet for Patients: BloggerCourse.com  Fact Sheet for Healthcare Providers: SeriousBroker.it  This test is not yet approved or cleared by the [place] FDA and has been authorized for detection and/or diagnosis of SARS-CoV-2 by FDA under an Emergency Use Authorization (EUA). This EUA will remain in effect (meaning this test can be used) for the duration of the COVID-19 declaration under Section 564(b)(1) of the Act, 21 U.S.C. section 360bbb-3(b)(1), unless the authorization is terminated or revoked.  Performed at [facility], [address], [place], [place] [address]   Urine Drug Screen, Qualitative     Status: None   Collection Time: 03/13/22 11:59 AM  Result Value Ref Range   Tricyclic, Ur Screen NONE DETECTED  NONE DETECTED   Amphetamines, Ur Screen NONE DETECTED NONE DETECTED   MDMA (Ecstasy)Ur Screen NONE DETECTED NONE DETECTED   Cocaine Metabolite,Ur [place] NONE DETECTED NONE DETECTED   Opiate, Ur Screen NONE DETECTED NONE DETECTED   Phencyclidine (PCP) Ur S NONE DETECTED NONE DETECTED   Cannabinoid 50 Ng, Ur [place] NONE DETECTED NONE DETECTED   Barbiturates, Ur Screen NONE DETECTED NONE DETECTED   Benzodiazepine, Ur Scrn NONE DETECTED NONE DETECTED   Methadone Scn, Ur NONE DETECTED NONE DETECTED    Comment: (NOTE) Tricyclics + metabolites, urine    Cutoff 1000 ng/mL Amphetamines + metabolites, urine  Cutoff 1000 ng/mL MDMA (Ecstasy), urine              Cutoff 500 ng/mL Cocaine Metabolite, urine          Cutoff 300 ng/mL Opiate + metabolites, urine        Cutoff 300 ng/mL Phencyclidine (PCP), urine         Cutoff 25 ng/mL Cannabinoid, urine                 Cutoff 50 ng/mL Barbiturates +  metabolites, urine  Cutoff 200 ng/mL Benzodiazepine, urine              Cutoff 200 ng/mL Methadone, urine                   Cutoff 300 ng/mL  The urine drug screen provides only a preliminary, unconfirmed analytical test result and should not be used for non-medical purposes. Clinical consideration and professional judgment should be applied to any positive drug screen result due to possible interfering substances. A more specific alternate chemical method must be used in order to obtain a confirmed analytical result. Gas chromatography / mass spectrometry (GC/MS) is the preferred confirm atory method. Performed at [facility], [address] [place], [place] [address]   Comprehensive metabolic panel     Status: Abnormal   Collection Time: 03/13/22 12:01 PM  Result Value Ref Range   Sodium 136 135 - 145 mmol/L   Potassium 3.8 3.5 - 5.1 mmol/L   Chloride 103 98 - 111 mmol/L   CO2 22 22 - 32 mmol/L   Glucose, Bld 116 (H) 70 - 99 mg/dL    Comment: Glucose reference range applies only to samples taken after fasting for at least 8 hours.   BUN 13 6 - 20 mg/dL   Creatinine, Ser 5.95 0.61 - 1.24 mg/dL   Calcium 9.7 8.9 - 63.8 mg/dL   Total Protein 8.1 6.5 - 8.1 g/dL   Albumin 4.7 3.5 - 5.0 g/dL   AST 52 (H) 15 - 41 U/L   ALT 48 (H) 0 - 44 U/L   Alkaline Phosphatase 44 38 - 126 U/L   Total Bilirubin 1.3 (H) 0.3 - 1.2 mg/dL   GFR, Estimated >75 >64 mL/min    Comment: (NOTE) Calculated using the CKD-EPI Creatinine Equation (2021)    Anion gap 11 5 - 15    Comment: Performed at [facility], [address] [place], [place] [address]  Ethanol     Status: Abnormal   Collection Time: 03/13/22 12:01 PM  Result Value Ref Range   Alcohol, Ethyl (B) 91 (H) <10 mg/dL    Comment: (NOTE) Lowest detectable limit for serum alcohol is 10 mg/dL.  For medical purposes only. Performed at [facility], [address], [place], [place] [address]   Salicylate  level     Status:  Abnormal   Collection Time: 03/13/22 12:01 PM  Result Value Ref Range   Salicylate Lvl <7.0 (L) 7.0 - 30.0 mg/dL    Comment: Performed at [facility], [address] [place], [place] [address]  Acetaminophen level     Status: Abnormal   Collection Time: 03/13/22 12:01 PM  Result Value Ref Range   Acetaminophen (Tylenol), Serum <10 (L) 10 - 30 ug/mL    Comment: (NOTE) Therapeutic concentrations vary significantly. A range of 10-30 ug/mL  may be an effective concentration for many patients. However, some  are best treated at concentrations outside of this range. Acetaminophen concentrations >150 ug/mL at 4 hours after ingestion  and >50 ug/mL at 12 hours after ingestion are often associated with  toxic reactions.  Performed at [facility], [address] [place], [place] [address]   cbc     Status: Abnormal   Collection Time: 03/13/22 12:01 PM  Result Value Ref Range   WBC 9.2 4.0 - 10.5 K/uL   RBC 5.49 4.22 - 5.81 MIL/uL   Hemoglobin 15.9 13.0 - 17.0 g/dL   HCT 13.0 86.5 - 78.4 %   MCV 88.3 80.0 - 100.0 fL   MCH 29.0 26.0 - 34.0 pg   MCHC 32.8 30.0 - 36.0 g/dL   RDW 69.6 29.5 - 28.4 %   Platelets 125 (L) 150 - 400 K/uL   nRBC 0.0 0.0 - 0.2 %    Comment: Performed at [facility], [address], [place], [place] [address]    Current Facility-Administered Medications  Medication Dose Route Frequency Provider Last Rate Last Admin   LORazepam (ATIVAN) injection 0-4 mg  0-4 mg Intravenous Q6H Georga Hacking, MD       Or   LORazepam (ATIVAN) tablet 0-4 mg  0-4 mg Oral Q6H Georga Hacking, MD   1 mg at 03/13/22 1235   [START ON 03/15/2022] LORazepam (ATIVAN) injection 0-4 mg  0-4 mg Intravenous Q12H Georga Hacking, MD       Or   Melene Muller ON 03/15/2022] LORazepam (ATIVAN) tablet 0-4 mg  0-4 mg Oral Q12H Georga Hacking, MD       thiamine (VITAMIN B1) tablet 100 mg  100 mg Oral Daily Georga Hacking, MD   100 mg at  03/13/22 1235   Or   thiamine (VITAMIN B1) injection 100 mg  100 mg Intravenous Daily Georga Hacking, MD       Current Outpatient Medications  Medication Sig Dispense Refill   amLODipine (NORVASC) 5 MG tablet Take 1 tablet (5 mg total) by mouth daily. (Patient not taking: Reported on 03/13/2022) 30 tablet 1   aspirin 81 MG EC tablet Take 1 tablet (81 mg total) by mouth daily. Swallow whole. (Patient not taking: Reported on 03/13/2022) 30 tablet 1   buPROPion (WELLBUTRIN XL) 150 MG 24 hr tablet Take 1 tablet (150 mg total) by mouth daily. (Patient not taking: Reported on 03/13/2022) 30 tablet 1   FLUoxetine (PROZAC) 20 MG capsule Take 1 capsule (20 mg total) by mouth daily. (Patient not taking: Reported on 03/13/2022) 30 capsule 1   fluticasone (FLONASE) 50 MCG/ACT nasal spray Place 2 sprays into both nostrils daily. (Patient not taking: Reported on 03/13/2022) 16 g 1   hydrOXYzine (ATARAX) 50 MG tablet Take 1 tablet (50 mg total) by mouth every 6 (six) hours as needed for anxiety (sleep). (Patient not taking: Reported on 03/13/2022) 60 tablet 1   ibuprofen (ADVIL) 600 MG  tablet Take 1 tablet (600 mg total) by mouth every 6 (six) hours as needed for mild pain, moderate pain or headache. (Patient not taking: Reported on 03/13/2022) 60 tablet 1   loratadine (CLARITIN) 10 MG tablet Take 1 tablet (10 mg total) by mouth daily. (Patient not taking: Reported on 03/13/2022) 30 tablet 1   nicotine polacrilex (NICORETTE) 2 MG gum Take 1 each (2 mg total) by mouth as needed for smoking cessation. (Patient not taking: Reported on 03/13/2022) 110 each 0   pantoprazole (PROTONIX) 20 MG tablet Take 1 tablet (20 mg total) by mouth daily. (Patient not taking: Reported on 03/13/2022) 30 tablet 1   QUEtiapine (SEROQUEL) 100 MG tablet Take 1 tablet (100 mg total) by mouth at bedtime as needed (sleep). (Patient not taking: Reported on 03/13/2022) 30 tablet 1    Musculoskeletal: Strength & Muscle Tone: within normal  limits Gait & Station: normal Patient leans: N/A            Psychiatric Specialty Exam:  Presentation  General Appearance: Appropriate for Environment  Eye Contact:Good  Speech:Clear and Coherent  Speech Volume:Normal  Handedness:Right   Mood and Affect  Mood:Depressed  Affect:Congruent   Thought Process  Thought Processes:Coherent  Descriptions of Associations:Intact  Orientation:Full (Time, Place and Person)  Thought Content:Perseveration  History of Schizophrenia/Schizoaffective disorder:No data recorded Duration of Psychotic Symptoms:No data recorded Hallucinations:Hallucinations: None  Ideas of Reference:None  Suicidal Thoughts:Suicidal Thoughts: Yes, Active SI Active Intent and/or Plan: With Access to Means  Homicidal Thoughts:Homicidal Thoughts: No   Sensorium  Memory:Immediate Good  Judgment:Poor  Insight:Poor   Executive Functions  Concentration:Good  Attention Span:Good  Recall:Good  Fund of Knowledge:Fair Language:Fair   Psychomotor Activity  Psychomotor Activity:Psychomotor Activity: Normal  Assets  Assets:Resilience; Physical Health; Social Support   Sleep  Sleep:Sleep: Good  Physical Exam: Physical Exam Vitals and nursing note reviewed.  HENT:     Head: Normocephalic.     Nose: No congestion or rhinorrhea.  Eyes:     General:        Right eye: No discharge.        Left eye: No discharge.  Pulmonary:     Effort: Pulmonary effort is normal.  Musculoskeletal:     Cervical back: Normal range of motion.  Skin:    General: Skin is dry.  Neurological:     Mental Status: He is alert and oriented to person, place, and time.  Psychiatric:        Attention and Perception: Attention normal.        Mood and Affect: Mood is depressed.        Speech: Speech normal.        Behavior: Behavior is cooperative.        Thought Content: Thought content is not paranoid or delusional. Thought content includes suicidal  ideation. Thought content does not include homicidal ideation. Thought content does not include homicidal or suicidal plan.        Cognition and Memory: Cognition normal.        Judgment: Judgment is impulsive.    Review of Systems  Constitutional: Negative.   HENT: Negative.    Eyes: Negative.   Respiratory: Negative.    Cardiovascular: Negative.   Musculoskeletal: Negative.   Neurological: Negative.   Psychiatric/Behavioral:  Positive for depression, substance abuse and suicidal ideas. Negative for hallucinations and memory loss. The patient is nervous/anxious. The patient does not have insomnia.    Blood pressure (!) 143/102, pulse (!) 104, temperature 99  F (37.2 C), temperature source Oral, resp. rate 18, height 5\' 9"  (1.753 m), weight 83.9 kg, SpO2 91 %. Body mass index is 27.32 kg/m.  Treatment Plan Summary: Plan Refer for inpatient psychiatric treatment. Reviewed with EDP  Disposition: Recommend psychiatric Inpatient admission when medically cleared. Supportive therapy provided about ongoing stressors. Discussed crisis plan, support from social network, calling 911, coming to the Emergency Department, and calling Suicide Hotline.  , NP 03/13/2022 3:46 PM ,

## 2022-03-14 MED ORDER — ONDANSETRON 4 MG PO TBDP: 4.0000 mg | ORAL_TABLET | Freq: Four times a day (QID) | ORAL | Status: DC | PRN

## 2022-03-14 MED ORDER — LOPERAMIDE HCL 2 MG PO CAPS: 2.0000 mg | ORAL_CAPSULE | ORAL | Status: DC | PRN

## 2022-03-14 MED ORDER — THIAMINE HCL 100 MG/ML IJ SOLN: 100.0000 mg | Freq: Once | INTRAMUSCULAR | Status: DC

## 2022-03-14 MED ORDER — LORAZEPAM 1 MG PO TABS
1.0000 mg | ORAL_TABLET | Freq: Four times a day (QID) | ORAL | Status: DC
  Administered 2022-03-14 – 2022-03-15 (×3): 1 mg via ORAL
  Filled 2022-03-14 (×3): qty 1

## 2022-03-14 MED ORDER — LORAZEPAM 1 MG PO TABS: 1.0000 mg | ORAL_TABLET | Freq: Four times a day (QID) | ORAL | Status: DC | PRN

## 2022-03-14 MED ORDER — HYDROXYZINE HCL 25 MG PO TABS
25.0000 mg | ORAL_TABLET | Freq: Four times a day (QID) | ORAL | Status: DC | PRN
  Administered 2022-03-14: 25 mg via ORAL
  Filled 2022-03-14: qty 1

## 2022-03-14 MED ORDER — LORAZEPAM 1 MG PO TABS: 1.0000 mg | ORAL_TABLET | Freq: Three times a day (TID) | ORAL | Status: DC

## 2022-03-14 MED ORDER — THIAMINE HCL 100 MG PO TABS
100.0000 mg | ORAL_TABLET | Freq: Every day | ORAL | Status: DC
  Administered 2022-03-15: 100 mg via ORAL
  Filled 2022-03-14: qty 1

## 2022-03-14 MED ORDER — LORAZEPAM 1 MG PO TABS: 1.0000 mg | ORAL_TABLET | Freq: Two times a day (BID) | ORAL | Status: DC

## 2022-03-14 MED ORDER — LORAZEPAM 1 MG PO TABS: 1.0000 mg | ORAL_TABLET | Freq: Every day | ORAL | Status: DC

## 2022-03-14 NOTE — ED Notes (Signed)
Report to Dee, RN.

## 2022-03-14 NOTE — ED Notes (Signed)
Report to include Situation, Background, Assessment, and Recommendations received from Amber RN. Patient alert and oriented, warm and dry, in no acute distress. Patient denies SI, HI, AVH and pain. Patient made aware of Q15 minute rounds and security cameras for their safety. Patient instructed to come to me with needs or concerns.  

## 2022-03-14 NOTE — ED Notes (Addendum)
Pt received dinner. 

## 2022-03-14 NOTE — ED Notes (Signed)
pt recieved snack and drink 

## 2022-03-14 NOTE — ED Notes (Signed)
Hospital meal provided, pt tolerated w/o complaints.  Waste discarded appropriately.  

## 2022-03-14 NOTE — ED Provider Notes (Signed)
Emergency Medicine Observation Re-evaluation Note  Lee Sparks is a 51 y.o. male, seen on rounds today.  Pt initially presented to the ED for complaints of Suicidal  Currently, the patient is is no acute distress. Denies any concerns at this time.  Physical Exam  Blood pressure 121/84, pulse (!) 107, temperature 98.4 F (36.9 C), resp. rate 18, height 5\' 9"  (1.753 m), weight 83.9 kg, SpO2 94 %.  Physical Exam: General: No apparent distress Pulm: Normal WOB Neuro: Moving all extremities Psych: Resting comfortably     ED Course / MDM     I have reviewed the labs performed to date as well as medications administered while in observation.  Recent changes in the last 24 hours include: No acute events overnight.  Plan   Current plan: Patient awaiting psychiatric disposition. Patient is not under full IVC at this time.    Tyreshia Ingman, , DO 03/14/22 [phone]

## 2022-03-14 NOTE — BH Assessment (Signed)
Referral checks:    [facility] ([phone]- [phone]), No answer    [facility] [phone]), No answer    [facility] [phone]), Facility is currently at [facility] [phone] or [phone]) A HIPPA compliant voicemail was left.    [facility] [phone]). Unable to reach intake staff.

## 2022-03-14 NOTE — ED Notes (Signed)
Pt up to restroom.

## 2022-03-14 NOTE — ED Notes (Signed)
Pt denies HI; pt confirms SI; pt denies specific plan currently; pt states "my plan yesterday was to cut my wrists".

## 2022-03-14 NOTE — BH Assessment (Signed)
Per charge nurse [place], Pt unable to transport to [facility] BMU tonight due to unit acuity and staffing issues.

## 2022-03-14 NOTE — Consult Note (Signed)
Client accepted to the BMU at [facility], cannot transfer at this time related to staffing issues.  TTS will continue to seek treatment elsewhere as we wait for staffing issues to resolve.  Nanine Means, PMHNP

## 2022-03-14 NOTE — BH Assessment (Addendum)
Referral checks:    [facility] ([phone]- [phone]), No answer    [facility] [phone]), No answer    [facility] [phone] -[phone]), Patient denied due to no insurance    [facility] [phone]), Facility is currently at [facility] [phone] or [phone])    [facility] [phone] or [phone]), Facility only accepts Geriatric patients    [facility] [phone]).

## 2022-03-14 NOTE — ED Notes (Signed)
Vol pending inpatient to [facility] when staff

## 2022-03-14 NOTE — ED Notes (Signed)
Snack and beverage given. 

## 2022-03-15 ENCOUNTER — Inpatient Hospital Stay: Admission: RE | Admit: 2022-03-15 | Payer: 59 | Source: Intra-hospital | Admitting: Psychiatry

## 2022-03-15 ENCOUNTER — Inpatient Hospital Stay
Admission: AD | Admit: 2022-03-15 | Discharge: 2022-04-02 | DRG: 885 | Disposition: A | Discharge status: Home / Self Care | Payer: 59 | Source: Intra-hospital | Attending: Psychiatry | Admitting: Psychiatry

## 2022-03-15 ENCOUNTER — Other Ambulatory Visit: Payer: Self-pay

## 2022-03-15 ENCOUNTER — Encounter: Payer: Self-pay | Admitting: Psychiatry

## 2022-03-15 DIAGNOSIS — G4700 Insomnia, unspecified: Secondary | ICD-10-CM | POA: Diagnosis present

## 2022-03-15 DIAGNOSIS — F332 Major depressive disorder, recurrent severe without psychotic features: Principal | ICD-10-CM | POA: Diagnosis present

## 2022-03-15 DIAGNOSIS — Z8249 Family history of ischemic heart disease and other diseases of the circulatory system: Secondary | ICD-10-CM | POA: Diagnosis not present

## 2022-03-15 DIAGNOSIS — K219 Gastro-esophageal reflux disease without esophagitis: Secondary | ICD-10-CM | POA: Diagnosis present

## 2022-03-15 DIAGNOSIS — F1010 Alcohol abuse, uncomplicated: Secondary | ICD-10-CM | POA: Diagnosis present

## 2022-03-15 DIAGNOSIS — I10 Essential (primary) hypertension: Secondary | ICD-10-CM | POA: Diagnosis present

## 2022-03-15 DIAGNOSIS — F411 Generalized anxiety disorder: Secondary | ICD-10-CM | POA: Diagnosis present

## 2022-03-15 DIAGNOSIS — Z91018 Allergy to other foods: Secondary | ICD-10-CM

## 2022-03-15 DIAGNOSIS — F17210 Nicotine dependence, cigarettes, uncomplicated: Secondary | ICD-10-CM | POA: Diagnosis present

## 2022-03-15 DIAGNOSIS — R45851 Suicidal ideations: Secondary | ICD-10-CM | POA: Diagnosis present

## 2022-03-15 MED ORDER — LORAZEPAM 1 MG PO TABS
1.0000 mg | ORAL_TABLET | Freq: Four times a day (QID) | ORAL | Status: AC | PRN
  Administered 2022-03-17: 1 mg via ORAL
  Filled 2022-03-15: qty 1

## 2022-03-15 MED ORDER — LOPERAMIDE HCL 2 MG PO CAPS: 2.0000 mg | ORAL_CAPSULE | ORAL | Status: AC | PRN

## 2022-03-15 MED ORDER — LORAZEPAM 1 MG PO TABS
1.0000 mg | ORAL_TABLET | Freq: Two times a day (BID) | ORAL | Status: AC
  Administered 2022-03-17 (×2): 1 mg via ORAL
  Filled 2022-03-15 (×2): qty 1

## 2022-03-15 MED ORDER — LORAZEPAM 1 MG PO TABS
1.0000 mg | ORAL_TABLET | Freq: Every day | ORAL | Status: AC
  Administered 2022-03-18: 1 mg via ORAL
  Filled 2022-03-15: qty 1

## 2022-03-15 MED ORDER — LORAZEPAM 1 MG PO TABS
1.0000 mg | ORAL_TABLET | Freq: Four times a day (QID) | ORAL | Status: AC
  Administered 2022-03-15 (×3): 1 mg via ORAL
  Filled 2022-03-15 (×3): qty 1

## 2022-03-15 MED ORDER — ACETAMINOPHEN 325 MG PO TABS: 650.0000 mg | ORAL_TABLET | Freq: Four times a day (QID) | ORAL | Status: DC | PRN

## 2022-03-15 MED ORDER — MAGNESIUM HYDROXIDE 400 MG/5ML PO SUSP
30.0000 mL | Freq: Every day | ORAL | Status: DC | PRN
  Administered 2022-03-26: 30 mL via ORAL
  Filled 2022-03-15: qty 30

## 2022-03-15 MED ORDER — ONDANSETRON 4 MG PO TBDP: 4.0000 mg | ORAL_TABLET | Freq: Four times a day (QID) | ORAL | Status: AC | PRN

## 2022-03-15 MED ORDER — LORAZEPAM 1 MG PO TABS
1.0000 mg | ORAL_TABLET | Freq: Three times a day (TID) | ORAL | Status: AC
  Administered 2022-03-16 (×3): 1 mg via ORAL
  Filled 2022-03-15 (×3): qty 1

## 2022-03-15 MED ORDER — THIAMINE HCL 100 MG/ML IJ SOLN: 100.0000 mg | Freq: Once | INTRAMUSCULAR | Status: DC

## 2022-03-15 MED ORDER — THIAMINE HCL 100 MG PO TABS
100.0000 mg | ORAL_TABLET | Freq: Every day | ORAL | Status: DC
  Administered 2022-03-17 – 2022-04-02 (×17): 100 mg via ORAL
  Filled 2022-03-15 (×17): qty 1

## 2022-03-15 MED ORDER — HYDROXYZINE HCL 25 MG PO TABS
25.0000 mg | ORAL_TABLET | Freq: Four times a day (QID) | ORAL | Status: AC | PRN
  Administered 2022-03-15 – 2022-03-17 (×5): 25 mg via ORAL
  Filled 2022-03-15 (×5): qty 1

## 2022-03-15 MED ORDER — TRAZODONE HCL 100 MG PO TABS
100.0000 mg | ORAL_TABLET | Freq: Every evening | ORAL | Status: DC | PRN
  Administered 2022-03-16 – 2022-03-20 (×2): 100 mg via ORAL
  Filled 2022-03-15 (×5): qty 1

## 2022-03-15 MED ORDER — ALUM & MAG HYDROXIDE-SIMETH 200-200-20 MG/5ML PO SUSP: 30.0000 mL | ORAL | Status: DC | PRN

## 2022-03-15 NOTE — BH Assessment (Signed)
Patient is to be admitted to [facility] by Psychiatric Nurse Practitioner Nanine Means.  Attending Physician will be Dr.  Toni Amend .   Patient has been assigned to room 320, by [facility] Charge Nurse Demetria.   Intake Paper Work has been signed and placed on patient chart.  ER staff is aware of the admission: Ronnie, ER Secretary   Dr. Erma Heritage, ER MD  Bonita Quin, Patient's Nurse  Valentina Gu, Patient Access.   Pt can be transported at 10:30 AM.

## 2022-03-15 NOTE — ED Provider Notes (Signed)
Emergency Medicine Observation Re-evaluation Note  Lee Sparks is a 51 y.o. male, seen on rounds today.  Pt initially presented to the ED for complaints of Suicidal Currently, the patient is calm, resting.  Physical Exam  BP 122/89   Pulse 88   Temp 97.8 F (36.6 C)   Resp 18   Ht 5\' 9"  (1.753 m)   Wt 83.9 kg   SpO2 95%   BMI 27.32 kg/m    ED Course / MDM  EKG:   I have reviewed the labs performed to date as well as medications administered while in observation.  Recent changes in the last 24 hours include none.  Plan  Current plan is for psych disposition.  Lee Sparks is not under involuntary commitment.     Theola Sequin, MD 03/15/22 1100

## 2022-03-15 NOTE — Tx Team (Signed)
Initial Treatment Plan 03/15/2022 3:37 PM Lee Sparks KHT:[id number]    PATIENT STRESSORS: Financial difficulties   Marital or family conflict   Medication change or noncompliance   Occupational concerns   Substance abuse     PATIENT STRENGTHS: Capable of independent living  Communication skills  Work skills    PATIENT IDENTIFIED PROBLEMS: Suicide Risk "I still have suicidal thought, it's on and off".    Substance Abuse "I've been drinking since I was 51 y/o. Lately I've been drinking 15 beers /day"    Financial strain    Poor family dynamics "I've not spoken to my daughter or grand daughter in a while because we had an argument".         DISCHARGE CRITERIA:  Improved stabilization in mood, thinking, and/or behavior Verbal commitment to aftercare and medication compliance Withdrawal symptoms are absent or subacute and managed without 24-hour nursing intervention  PRELIMINARY DISCHARGE PLAN: Outpatient therapy Placement in alternative living arrangements  PATIENT/FAMILY INVOLVEMENT: This treatment plan has been presented to and reviewed with the patient, Lee Sparks. The patient have been given the opportunity to ask questions and make suggestions.  Sherryl Manges, RN 03/15/2022, 3:37 PM

## 2022-03-15 NOTE — Plan of Care (Signed)
PT denies SI HI AVH, CIWA = 6 no c/o significant distress.  PT is compliant with medication.

## 2022-03-15 NOTE — ED Notes (Signed)
Accepted to bmu @ [facility] cannot transfer at this time due to staffing issues

## 2022-03-15 NOTE — Progress Notes (Signed)
Pt admitted to BMU from [facility] [address] where he presented initially with elevated BAL of 91, SI with plan to cut self and run into traffic. Ambulatory to milieu with slow but steady gait. Denies HI, AVH and pain on assessment. Endorsed passive SI, verbally contracts for safety. Observed with flat affect, depressed mood, fair eye contact, fidgety & restless on interactions. Rates his depression 6/10 and anxiety 8/10; current stressors includes finances "I'm not working right now because of my alcoholism, estranged relationship with daughter & grandchild "we had an argument one holiday and we're not talking". States he's currently homeless without a job "I've had several jobs in the past but I couldn't keep them because of my anxiety with the stress from work". Reports he's been drinking since age 50 "Actually it got worse, consistent in my teenage years. I went to rehab a year ago and was sober for couple of months". Reports he borrows money from his brother and sister in law, pawn stuff and give plasma to support his habit. States he last drank 2 days ago. UDS on 03/13/22 negative for illicit drugs. Denies all forms of abuse. Skin assessed, tattoos noted on bilateral deltoids. Belongings checked and items deemed contraband secured in locker. Unit orientation done, routines discussed, care plan reviewed and admission documents signed. Emotional support, reassurance and encouragement provided to pt. Safety checks initiated at Q 15 minutes intervals without outburst. Pt tolerated fluids well when offered.

## 2022-03-15 NOTE — ED Notes (Signed)
Nurse talked to Patient and He still voices SI ideation, but no plan, will continue to monitor, Patient is pleasant and cooperative, states that since has was a teenager He has been drinking and most He has ever been sober is 6 months in His live span, Nurse talked to him about sobriety and He did not seem to interested, only wanted to know " when can I go to the to another unit" Nurse will continue to monitor. Patient is safe, camera surveillance in progress and q 15 minute checks.

## 2022-03-16 DIAGNOSIS — F332 Major depressive disorder, recurrent severe without psychotic features: Secondary | ICD-10-CM | POA: Diagnosis not present

## 2022-03-16 MED ORDER — NICOTINE POLACRILEX 2 MG MT GUM
2.0000 mg | CHEWING_GUM | OROMUCOSAL | Status: DC | PRN
Start: 2022-03-16 — End: 2022-04-02
  Administered 2022-03-16 – 2022-04-02 (×23): 2 mg via ORAL
  Filled 2022-03-16 (×28): qty 1

## 2022-03-16 MED ORDER — AMLODIPINE BESYLATE 5 MG PO TABS
5.0000 mg | ORAL_TABLET | Freq: Every day | ORAL | Status: DC
  Administered 2022-03-16 – 2022-04-02 (×18): 5 mg via ORAL
  Filled 2022-03-16 (×18): qty 1

## 2022-03-16 MED ORDER — RISPERIDONE 1 MG PO TABS
2.0000 mg | ORAL_TABLET | Freq: Every day | ORAL | Status: DC
  Filled 2022-03-16: qty 2

## 2022-03-16 MED ORDER — CITALOPRAM HYDROBROMIDE 20 MG PO TABS
20.0000 mg | ORAL_TABLET | Freq: Every day | ORAL | Status: DC
  Administered 2022-03-16 – 2022-03-23 (×8): 20 mg via ORAL
  Filled 2022-03-16 (×8): qty 1

## 2022-03-16 MED ORDER — FOLIC ACID 1 MG PO TABS
1.0000 mg | ORAL_TABLET | Freq: Every day | ORAL | Status: DC
  Administered 2022-03-16 – 2022-04-02 (×18): 1 mg via ORAL
  Filled 2022-03-16 (×18): qty 1

## 2022-03-16 NOTE — BHH Suicide Risk Assessment (Signed)
[facility] Admission Suicide Risk Assessment   Nursing information obtained from:  Patient Demographic factors:  Male, Caucasian, Adolescent or young adult, Low socioeconomic status, Living alone, Unemployed Current Mental Status:  Suicidal ideation indicated by patient, Self-harm thoughts, Self-harm behaviors Loss Factors:  Decrease in vocational status, Loss of significant relationship, Financial problems / change in socioeconomic status Historical Factors:  Impulsivity Risk Reduction Factors:  Sense of responsibility to family  Total Time spent with patient: 1 hour Principal Problem: Major depressive disorder, recurrent severe without psychotic features (HCC) Diagnosis:  Principal Problem:   Major depressive disorder, recurrent severe without psychotic features (HCC)  Subjective Data: Patient has history of alcohol abuse.  He has been admitted to this facility several times for depression and alcohol use.  He presents to the ED with BAL of 91.    On evaluation, patient is alert and oriented x 3.  He states that he has suicidal.  Patient reports that he has a very long history of alcohol abuse, never having more than 5 to 6 months of sobriety at a time.  He voices that he has increased stressors lately, mainly problems in relationship with his daughter.  This has caused him to have a worsening depression and suicidal thoughts.  Patient reports that he stepped out in front of traffic last evening, trying to get a truck to hit him.  Patient denies homicidal ideation, paranoia, auditory or visual hallucinations.  Continued Clinical Symptoms:  Alcohol Use Disorder Identification Test Final Score (AUDIT): 31 The "Alcohol Use Disorders Identification Test", Guidelines for Use in Primary Care, Second Edition.  World Science writer [facility]). Score between 0-7:  no or low risk or alcohol related problems. Score between 8-15:  moderate risk of alcohol related problems. Score between 16-19:  high risk of  alcohol related problems. Score 20 or above:  warrants further diagnostic evaluation for alcohol dependence and treatment.   CLINICAL FACTORS:   Depression:   Anhedonia Alcohol/Substance Abuse/Dependencies   Musculoskeletal: Strength & Muscle Tone: within normal limits Gait & Station: normal Patient leans: N/A  Psychiatric Specialty Exam:  Presentation  General Appearance: Appropriate for Environment  Eye Contact:Good  Speech:Clear and Coherent  Speech Volume:Normal  Handedness:Right   Mood and Affect  Mood:Depressed  Affect:Congruent   Thought Process  Thought Processes:Coherent  Descriptions of Associations:Intact  Orientation:Full (Time, Place and Person)  Thought Content:Perseveration  History of Schizophrenia/Schizoaffective disorder:No  Duration of Psychotic Symptoms:No data recorded Hallucinations:No data recorded Ideas of Reference:None  Suicidal Thoughts:No data recorded Homicidal Thoughts:No data recorded  Sensorium  Memory:Immediate Good  Judgment:Poor  Insight:Poor   Executive Functions  Concentration:Good  Attention Span:Good  Recall:Good  Fund of Knowledge:Fair  Language:Fair   Psychomotor Activity  Psychomotor Activity:No data recorded  Assets  Assets:Resilience; Physical Health; Social Support   Sleep  Sleep:No data recorded    Blood pressure (!) 135/102, pulse 76, temperature 97.8 F (36.6 C), temperature source Oral, resp. rate 18, height 5' 8.5" (1.74 m), weight 88.9 kg, SpO2 100 %. Body mass index is 29.36 kg/m.   COGNITIVE FEATURES THAT CONTRIBUTE TO RISK:  None    SUICIDE RISK:   Minimal: No identifiable suicidal ideation.  Patients presenting with no risk factors but with morbid ruminations; may be classified as minimal risk based on the severity of the depressive symptoms  PLAN OF CARE: See orders  I certify that inpatient services furnished can reasonably be expected to improve the patient's  condition.   Sarina Ill, DO 03/16/2022, 11:24 AM

## 2022-03-16 NOTE — H&P (Signed)
Psychiatric Admission Assessment Adult  Patient Identification: Lee Sparks MRN:  [id number] Date of Evaluation:  03/16/2022 Chief Complaint:  Major depressive disorder, recurrent severe without psychotic features (HCC) [F33.2] Principal Diagnosis: Major depressive disorder, recurrent severe without psychotic features (HCC) Diagnosis:  Principal Problem:   Major depressive disorder, recurrent severe without psychotic features (HCC)  History of Present Illness: Lee Sparks is a 51 year old white male who was voluntarily admitted to adult psychiatry for alcohol and suicidal ideation.  He states that he is having problems with his daughter and therefore his granddaughters.  He is not real specific on what the problem is.  He endorses anhedonia, depressed mood, anxiety, and hopelessness.  He has been living in the back of his church but his pastor is kicking him out because he is not attending church.  He states he has been too busy with his family.  He has been sleeping from place to place including his brothers.  He was having thoughts of running into traffic.  He states he has not been able to afford his medications and has been off of them.  He last worked approximately 1 week ago but quit because of minimum wage.  PER INITIAL INTAKE: Patient has history of alcohol abuse.  He has been admitted to this facility several times for depression and alcohol use.  He presents to the ED with BAL of 91.    On evaluation, patient is alert and oriented x 3.  He states that he has suicidal.  Patient reports that he has a very long history of alcohol abuse, never having more than 5 to 6 months of sobriety at a time.  He voices that he has increased stressors lately, mainly problems in relationship with his daughter.  This has caused him to have a worsening depression and suicidal thoughts.  Patient reports that he stepped out in front of traffic last evening, trying to get a truck to hit him.  Patient denies  homicidal ideation, paranoia, auditory or visual hallucinations. Associated Signs/Symptoms: Depression Symptoms:  depressed mood, suicidal thoughts with specific plan, Duration of Depression Symptoms: Greater than two weeks  (Hypo) Manic Symptoms:  Irritable Mood, Anxiety Symptoms:  Excessive Worry, Psychotic Symptoms:   None PTSD Symptoms: NA Total Time spent with patient: 1 hour  Past Psychiatric History: Numerous admissions for alcohol abuse and major depressive disorder.  Is the patient at risk to self? Yes.    Has the patient been a risk to self in the past 6 months? Yes.    Has the patient been a risk to self within the distant past? Yes.    Is the patient a risk to others? No.  Has the patient been a risk to others in the past 6 months? No.  Has the patient been a risk to others within the distant past? No.   [place] Scale:  Flowsheet Row Admission (Current) from 03/15/2022 in [facility] INPATIENT BEHAVIORAL MEDICINE ED from 03/13/2022 in [facility] Admission (Discharged) from 10/29/2021 in [facility] INPATIENT BEHAVIORAL MEDICINE  C-SSRS RISK CATEGORY High Risk High Risk Moderate Risk        Prior Inpatient Therapy:   Prior Outpatient Therapy:    Alcohol Screening: 1. How often do you have a drink containing alcohol?: 4 or more times a week 2. How many drinks containing alcohol do you have on a typical day when you are drinking?: 10 or more 3. How often do you have six or more drinks on one occasion?:  Daily or almost daily AUDIT-C Score: 12 4. How often during the last year have you found that you were not able to stop drinking once you had started?: Daily or almost daily 5. How often during the last year have you failed to do what was normally expected from you because of drinking?: Weekly 6. How often during the last year have you needed a first drink in the morning to get yourself going after a heavy drinking session?: Weekly 7. How often  during the last year have you had a feeling of guilt of remorse after drinking?: Daily or almost daily 8. How often during the last year have you been unable to remember what happened the night before because you had been drinking?: Less than monthly 9. Have you or someone else been injured as a result of your drinking?: No 10. Has a relative or friend or a doctor or another health worker been concerned about your drinking or suggested you cut down?: Yes, during the last year Alcohol Use Disorder Identification Test Final Score (AUDIT): 31 Alcohol Brief Interventions/Follow-up: Alcohol education/Brief advice Substance Abuse History in the last 12 months:  Yes.   Consequences of Substance Abuse: Withdrawal Symptoms:   Cramps Diaphoresis Diarrhea Headaches Nausea Tremors Previous Psychotropic Medications: Yes  Psychological Evaluations: Yes  Past Medical History:  Past Medical History:  Diagnosis Date   Alcohol-induced pancreatitis    Anemia    Asthma    Bronchitis     Past Surgical History:  Procedure Laterality Date   CYSTECTOMY     ESOPHAGOGASTRODUODENOSCOPY  07/28/2011   Procedure: ESOPHAGOGASTRODUODENOSCOPY (EGD);  Surgeon: Freddy Jaksch, MD;  Location: [facility] ENDOSCOPY;  Service: Endoscopy;  Laterality: N/A;   HERNIA REPAIR     Family History:  Family History  Problem Relation Age of Onset   Hypertension Father    Aneurysm Father    Cancer Other    Hypertension Mother    Pneumonia Mother    Family Psychiatric  History: Unremarkable Tobacco Screening:   Social History:  Social History   Substance and Sexual Activity  Alcohol Use Yes   Alcohol/week: 50.0 standard drinks of alcohol   Types: 50 Cans of beer per week   Comment: Equivalent of two 40 oz beers daily.     Social History   Substance and Sexual Activity  Drug Use No    Additional Social History:                           Allergies:   Allergies  Allergen Reactions   Grapefruit  Concentrate Hives   Grapefruit Diet Or [Extra Strength Grapefruit] Hives    Pt states he has a reaction to grapefruit.    Lab Results: No results found for this or any previous visit (from the past 48 hour(s)).  Blood Alcohol level:  Lab Results  Component Value Date   ETH 91 (H) 03/13/2022   ETH 108 (H) 10/29/2021    Metabolic Disorder Labs:  Lab Results  Component Value Date   HGBA1C 5.7 (H) 05/30/2020   No results found for: "PROLACTIN" Lab Results  Component Value Date   CHOL 206 (H) 05/30/2020   TRIG 187 (H) 05/30/2020   HDL 28 (L) 05/30/2020   CHOLHDL 7.4 (H) 05/30/2020   LDLCALC 144 (H) 05/30/2020   LDLCALC 98 06/28/2013    Current Medications: Current Facility-Administered Medications  Medication Dose Route Frequency Provider Last Rate Last Admin   acetaminophen (TYLENOL)  tablet 650 mg  650 mg Oral Q6H PRN Charm Rings, NP       alum & mag hydroxide-simeth (MAALOX/MYLANTA) 200-200-20 MG/5ML suspension 30 mL  30 mL Oral Q4H PRN Charm Rings, NP       citalopram (CELEXA) tablet 20 mg  20 mg Oral Daily Sarina Ill, DO       hydrOXYzine (ATARAX) tablet 25 mg  25 mg Oral Q6H PRN Charm Rings, NP   25 mg at 03/15/22 2123   loperamide (IMODIUM) capsule 2-4 mg  2-4 mg Oral PRN Charm Rings, NP       LORazepam (ATIVAN) tablet 1 mg  1 mg Oral Q6H PRN Charm Rings, NP       LORazepam (ATIVAN) tablet 1 mg  1 mg Oral TID Charm Rings, NP   1 mg at 03/16/22 0539   Followed by   Melene Muller ON 03/17/2022] LORazepam (ATIVAN) tablet 1 mg  1 mg Oral BID Charm Rings, NP       Followed by   Melene Muller ON 03/18/2022] LORazepam (ATIVAN) tablet 1 mg  1 mg Oral Daily Charm Rings, NP       magnesium hydroxide (MILK OF MAGNESIA) suspension 30 mL  30 mL Oral Daily PRN Charm Rings, NP       ondansetron (ZOFRAN-ODT) disintegrating tablet 4 mg  4 mg Oral Q6H PRN Charm Rings, NP       risperiDONE (RISPERDAL) tablet 2 mg  2 mg Oral QHS Sarina Ill,  DO       thiamine (VITAMIN B1) injection 100 mg  100 mg Intramuscular Once Charm Rings, NP       Melene Muller ON 03/17/2022] thiamine (VITAMIN B1) tablet 100 mg  100 mg Oral Daily Charm Rings, NP       traZODone (DESYREL) tablet 100 mg  100 mg Oral QHS PRN Charm Rings, NP       PTA Medications: Medications Prior to Admission  Medication Sig Dispense Refill Last Dose   amLODipine (NORVASC) 5 MG tablet Take 1 tablet (5 mg total) by mouth daily. (Patient not taking: Reported on 03/13/2022) 30 tablet 1    aspirin 81 MG EC tablet Take 1 tablet (81 mg total) by mouth daily. Swallow whole. (Patient not taking: Reported on 03/13/2022) 30 tablet 1    buPROPion (WELLBUTRIN XL) 150 MG 24 hr tablet Take 1 tablet (150 mg total) by mouth daily. (Patient not taking: Reported on 03/13/2022) 30 tablet 1    FLUoxetine (PROZAC) 20 MG capsule Take 1 capsule (20 mg total) by mouth daily. (Patient not taking: Reported on 03/13/2022) 30 capsule 1    fluticasone (FLONASE) 50 MCG/ACT nasal spray Place 2 sprays into both nostrils daily. (Patient not taking: Reported on 03/13/2022) 16 g 1    hydrOXYzine (ATARAX) 50 MG tablet Take 1 tablet (50 mg total) by mouth every 6 (six) hours as needed for anxiety (sleep). (Patient not taking: Reported on 03/13/2022) 60 tablet 1    ibuprofen (ADVIL) 600 MG tablet Take 1 tablet (600 mg total) by mouth every 6 (six) hours as needed for mild pain, moderate pain or headache. (Patient not taking: Reported on 03/13/2022) 60 tablet 1    loratadine (CLARITIN) 10 MG tablet Take 1 tablet (10 mg total) by mouth daily. (Patient not taking: Reported on 03/13/2022) 30 tablet 1    nicotine polacrilex (NICORETTE) 2 MG gum Take 1 each (2 mg total) by  mouth as needed for smoking cessation. (Patient not taking: Reported on 03/13/2022) 110 each 0    pantoprazole (PROTONIX) 20 MG tablet Take 1 tablet (20 mg total) by mouth daily. (Patient not taking: Reported on 03/13/2022) 30 tablet 1    QUEtiapine (SEROQUEL)  100 MG tablet Take 1 tablet (100 mg total) by mouth at bedtime as needed (sleep). (Patient not taking: Reported on 03/13/2022) 30 tablet 1     Musculoskeletal: Strength & Muscle Tone: within normal limits Gait & Station: normal Patient leans: N/A            Psychiatric Specialty Exam:  Presentation  General Appearance: Appropriate for Environment  Eye Contact:Good  Speech:Clear and Coherent  Speech Volume:Normal  Handedness:Right   Mood and Affect  Mood:Depressed  Affect:Congruent   Thought Process  Thought Processes:Coherent  Duration of Psychotic Symptoms: No data recorded Past Diagnosis of Schizophrenia or Psychoactive disorder: No  Descriptions of Associations:Intact  Orientation:Full (Time, Place and Person)  Thought Content:Perseveration  Hallucinations:No data recorded Ideas of Reference:None  Suicidal Thoughts:No data recorded Homicidal Thoughts:No data recorded  Sensorium  Memory:Immediate Good  Judgment:Poor  Insight:Poor   Executive Functions  Concentration:Good  Attention Span:Good  Recall:Good  Fund of Knowledge:Fair  Language:Fair   Psychomotor Activity  Psychomotor Activity:No data recorded  Assets  Assets:Resilience; Physical Health; Social Support   Sleep  Sleep:No data recorded   Physical Exam: Physical Exam Vitals and nursing note reviewed.  Constitutional:      Appearance: Normal appearance. He is normal weight.  HENT:     Head: Normocephalic and atraumatic.     Nose: Nose normal.     Mouth/Throat:     Pharynx: Oropharynx is clear.  Eyes:     Extraocular Movements: Extraocular movements intact.     Pupils: Pupils are equal, round, and reactive to light.  Cardiovascular:     Rate and Rhythm: Normal rate and regular rhythm.     Pulses: Normal pulses.     Heart sounds: Normal heart sounds.  Pulmonary:     Effort: Pulmonary effort is normal.     Breath sounds: Normal breath sounds.  Abdominal:      General: Abdomen is flat. Bowel sounds are normal.     Palpations: Abdomen is soft.  Musculoskeletal:        General: Normal range of motion.     Cervical back: Normal range of motion and neck supple.  Skin:    General: Skin is warm and dry.  Neurological:     General: No focal deficit present.     Mental Status: He is alert and oriented to person, place, and time.  Psychiatric:        Attention and Perception: Attention and perception normal.        Mood and Affect: Mood is depressed. Affect is flat.        Speech: Speech normal.        Behavior: Behavior normal. Behavior is cooperative.        Thought Content: Thought content normal.        Cognition and Memory: Cognition and memory normal.        Judgment: Judgment normal.    Review of Systems  Constitutional: Negative.   HENT: Negative.    Eyes: Negative.   Respiratory: Negative.    Cardiovascular: Negative.   Gastrointestinal: Negative.   Genitourinary: Negative.   Musculoskeletal: Negative.   Skin: Negative.   Neurological: Negative.   Endo/Heme/Allergies: Negative.   Psychiatric/Behavioral:  Positive  for depression and substance abuse.    Blood pressure (!) 135/102, pulse 76, temperature 97.8 F (36.6 C), temperature source Oral, resp. rate 18, height 5' 8.5" (1.74 m), weight 88.9 kg, SpO2 100 %. Body mass index is 29.36 kg/m.  Treatment Plan Summary: Daily contact with patient to assess and evaluate symptoms and progress in treatment, Medication management, and Plan Vistaril as needed.  Celexa 20 mg/day, and Risperdal 2 mg at bedtime.  Detox protocol.  Observation Level/Precautions:  15 minute checks  Laboratory:  CBC Chemistry Profile HbAIC  Psychotherapy:    Medications:    Consultations:    Discharge Concerns:    Estimated LOS:  Other:     Physician Treatment Plan for Primary Diagnosis: Major depressive disorder, recurrent severe without psychotic features (HCC) Long Term Goal(s): Improvement in  symptoms so as ready for discharge  Short Term Goals: Ability to identify changes in lifestyle to reduce recurrence of condition will improve, Ability to verbalize feelings will improve, Ability to disclose and discuss suicidal ideas, Ability to demonstrate self-control will improve, Ability to identify and develop effective coping behaviors will improve, Ability to maintain clinical measurements within normal limits will improve, Compliance with prescribed medications will improve, and Ability to identify triggers associated with substance abuse/mental health issues will improve  Physician Treatment Plan for Secondary Diagnosis: Principal Problem:   Major depressive disorder, recurrent severe without psychotic features (HCC)  I certify that inpatient services furnished can reasonably be expected to improve the patient's condition.    Sarina Illichard Edward Diyana Starrett, DO 8/20/202311:26 AM

## 2022-03-16 NOTE — Plan of Care (Signed)
Met with PT in medication room. Pt is assessed for CIWA, has mild complaints of anxiety. Pt is adherent with scheduled medications. Pt denies SI / HI / AVH.    Problem: Education: Goal: Knowledge of General Education information will improve Description: Including pain rating scale, medication(s)/side effects and non-pharmacologic comfort measures Outcome: Not Progressing   Problem: Health Behavior/Discharge Planning: Goal: Ability to manage health-related needs will improve Outcome: Not Progressing   Problem: Clinical Measurements: Goal: Ability to maintain clinical measurements within normal limits will improve Outcome: Not Progressing Goal: Will remain free from infection Outcome: Not Progressing
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
## 2022-03-16 NOTE — Group Note (Signed)
[facility] LCSW Group Therapy Note   Group Date: 03/16/2022 Start Time: 1300 End Time: 1400  Type of Therapy and Topic:  Group Therapy:  Feelings around Relapse and Recovery  Participation Level:  Active   Mood: blunted   Description of Group:    Patients in this group will discuss emotions they experience before and after a relapse. They will process how experiencing these feelings, or avoidance of experiencing them, relates to having a relapse. Facilitator will guide patients to explore emotions they have related to recovery. Patients will be encouraged to process which emotions are more powerful. They will be guided to discuss the emotional reaction significant others in their lives may have to patients' relapse or recovery. Patients will be assisted in exploring ways to respond to the emotions of others without this contributing to a relapse.  Therapeutic Goals: Patient will identify two or more emotions that lead to relapse for them:  Patient will identify two emotions that result when they relapse:  Patient will identify two emotions related to recovery:  Patient will demonstrate ability to communicate their needs through discussion and/or role plays.   Summary of Patient Progress: Patient was present for the entirety of the group session. Patient was an active listener and participated in the topic of discussion, provided helpful advice to others, and added nuance to topic of conversation. Patient participated in role play example. Patient expressed confidence in understanding the material. Patient did not ask any clarifying questions.    Therapeutic Modalities:   Cognitive Behavioral Therapy Solution-Focused Therapy Assertiveness Training Relapse Prevention Therapy   Corky Crafts, [place]

## 2022-03-17 DIAGNOSIS — F332 Major depressive disorder, recurrent severe without psychotic features: Secondary | ICD-10-CM | POA: Diagnosis not present

## 2022-03-17 MED ORDER — QUETIAPINE FUMARATE 100 MG PO TABS
100.0000 mg | ORAL_TABLET | Freq: Every day | ORAL | Status: DC
  Administered 2022-03-17 – 2022-03-19 (×3): 100 mg via ORAL
  Filled 2022-03-17 (×3): qty 1

## 2022-03-17 NOTE — Progress Notes (Signed)
Recreation Therapy Notes  Date: 03/17/2022   Time: 10:05 am     Location: Court yard       Behavioral response: N/A   Intervention Topic: Strengths     Discussion/Intervention: Patient refused to attend group.    Clinical Observations/Feedback:  Patient refused to attend group.    Keasha Malkiewicz LRT/CTRS        Freddie Dymek 03/17/2022 10:22 AM 

## 2022-03-17 NOTE — Progress Notes (Signed)
Recreation Therapy Notes  INPATIENT RECREATION THERAPY ASSESSMENT  Patient Details Name: Lee Sparks MRN: [id number] DOB: July 12, 1971 Today's Date: 03/17/2022       Information Obtained From: Patient  Able to Participate in Assessment/Interview: Yes  Patient Presentation: Responsive  Reason for Admission (Per Patient): Active Symptoms, Suicidal Ideation, Substance Abuse, Med Non-Compliance  Patient Stressors: Other (Comment) (Everything)  Coping Skills:   Isolation, Avoidance, Substance Abuse  Leisure Interests (2+):  Exercise - Walking, Individual - TV, Insurance account manager, Music - Listen  Frequency of Recreation/Participation: Weekly  Awareness of Community Resources:  Yes  Community Resources:  [place], [place]  Current Use: Yes  If no, Barriers?:    Expressed Interest in [employer] Information: Yes  [place] of Residence:  [place]  Patient Main Form of Transportation: Therapist, music  Patient Strengths:  Good listener,people person, good worker  Patient Identified Areas of Improvement:  Quit drinking  Patient Goal for Hospitalization:  Get medicine right. Find long term program.  Current SI (including self-harm):  No  Current HI:  No  Current AVH: No  Staff Intervention Plan: Group Attendance, Collaborate with Interdisciplinary Treatment Team  Consent to Intern Participation: N/A  Minh Jasper 03/17/2022, 10:37 AM

## 2022-03-17 NOTE — BHH Counselor (Signed)
CSW met with pt briefly to discuss placement options that he was interested in contacting. Pt stated that he would like to get information on [facility], [facility], [facility] informed pt that these facilities would be contacted. CSW also informed pt that he would be given a resource list to review for other potential places that he would like to be contacted. He agreed. No other concerns expressed. Contact ended without incident.   Chalmers Guest. Guerry Bruin, MSW, [place], [place] 03/17/2022 2:24 PM

## 2022-03-17 NOTE — BH IP Treatment Plan (Signed)
Interdisciplinary Treatment and Diagnostic Plan Update  03/17/2022 Time of Session: 09:46 Lee Sparks MRN: [id number]  Principal Diagnosis: Major depressive disorder, recurrent severe without psychotic features ([name])  Secondary Diagnoses: Principal Problem:   Major depressive disorder, recurrent severe without psychotic features ([name])   Current Medications:  Current Facility-Administered Medications  Medication Dose Route Frequency Provider Last Rate Last Admin   acetaminophen (TYLENOL) tablet 650 mg  650 mg Oral Q6H PRN Patrecia Pour, NP       alum & mag hydroxide-simeth (MAALOX/MYLANTA) 200-200-20 MG/5ML suspension 30 mL  30 mL Oral Q4H PRN Patrecia Pour, NP       amLODipine (NORVASC) tablet 5 mg  5 mg Oral Daily Parks Ranger, DO   5 mg at 03/17/22 0749   citalopram (CELEXA) tablet 20 mg  20 mg Oral Daily Parks Ranger, DO   20 mg at 98/92/11 9417   folic acid (FOLVITE) tablet 1 mg  1 mg Oral Daily Parks Ranger, DO   1 mg at 03/17/22 0750   hydrOXYzine (ATARAX) tablet 25 mg  25 mg Oral Q6H PRN Patrecia Pour, NP   25 mg at 03/16/22 1856   loperamide (IMODIUM) capsule 2-4 mg  2-4 mg Oral PRN Patrecia Pour, NP       LORazepam (ATIVAN) tablet 1 mg  1 mg Oral Q6H PRN Patrecia Pour, NP       LORazepam (ATIVAN) tablet 1 mg  1 mg Oral BID Patrecia Pour, NP   1 mg at 03/17/22 0750   Followed by   Derrill Memo ON 03/18/2022] LORazepam (ATIVAN) tablet 1 mg  1 mg Oral Daily Patrecia Pour, NP       magnesium hydroxide (MILK OF MAGNESIA) suspension 30 mL  30 mL Oral Daily PRN Patrecia Pour, NP       nicotine polacrilex (NICORETTE) gum 2 mg  2 mg Oral PRN Parks Ranger, DO   2 mg at 03/16/22 1856   ondansetron (ZOFRAN-ODT) disintegrating tablet 4 mg  4 mg Oral Q6H PRN Patrecia Pour, NP       QUEtiapine (SEROQUEL) tablet 100 mg  100 mg Oral QHS [name], [name], MD       thiamine (VITAMIN B1) tablet 100 mg  100 mg Oral Daily Patrecia Pour, NP    100 mg at 03/17/22 0750   traZODone (DESYREL) tablet 100 mg  100 mg Oral QHS PRN Patrecia Pour, NP   100 mg at 03/16/22 2117   PTA Medications: Medications Prior to Admission  Medication Sig Dispense Refill Last Dose   amLODipine (NORVASC) 5 MG tablet Take 1 tablet (5 mg total) by mouth daily. (Patient not taking: Reported on 03/13/2022) 30 tablet 1    aspirin 81 MG EC tablet Take 1 tablet (81 mg total) by mouth daily. Swallow whole. (Patient not taking: Reported on 03/13/2022) 30 tablet 1    buPROPion (WELLBUTRIN XL) 150 MG 24 hr tablet Take 1 tablet (150 mg total) by mouth daily. (Patient not taking: Reported on 03/13/2022) 30 tablet 1    FLUoxetine (PROZAC) 20 MG capsule Take 1 capsule (20 mg total) by mouth daily. (Patient not taking: Reported on 03/13/2022) 30 capsule 1    fluticasone (FLONASE) 50 MCG/ACT nasal spray Place 2 sprays into both nostrils daily. (Patient not taking: Reported on 03/13/2022) 16 g 1    hydrOXYzine (ATARAX) 50 MG tablet Take 1 tablet (50 mg total) by mouth every 6 (  six) hours as needed for anxiety (sleep). (Patient not taking: Reported on 03/13/2022) 60 tablet 1    ibuprofen (ADVIL) 600 MG tablet Take 1 tablet (600 mg total) by mouth every 6 (six) hours as needed for mild pain, moderate pain or headache. (Patient not taking: Reported on 03/13/2022) 60 tablet 1    loratadine (CLARITIN) 10 MG tablet Take 1 tablet (10 mg total) by mouth daily. (Patient not taking: Reported on 03/13/2022) 30 tablet 1    nicotine polacrilex (NICORETTE) 2 MG gum Take 1 each (2 mg total) by mouth as needed for smoking cessation. (Patient not taking: Reported on 03/13/2022) 110 each 0    pantoprazole (PROTONIX) 20 MG tablet Take 1 tablet (20 mg total) by mouth daily. (Patient not taking: Reported on 03/13/2022) 30 tablet 1    QUEtiapine (SEROQUEL) 100 MG tablet Take 1 tablet (100 mg total) by mouth at bedtime as needed (sleep). (Patient not taking: Reported on 03/13/2022) 30 tablet 1     Patient  Stressors: Financial difficulties   Marital or family conflict   Medication change or noncompliance   Occupational concerns   Substance abuse    Patient Strengths: Capable of independent living  [employer]ed forces logistics/support/administrative officer  Work skills   Treatment Modalities: Medication Management, Group therapy, Case management,  1 to 1 session with clinician, Psychoeducation, Recreational therapy.   Physician Treatment Plan for Primary Diagnosis: Major depressive disorder, recurrent severe without psychotic features (Lee Sparks) Long Term Goal(s): Improvement in symptoms so as ready for discharge   Short Term Goals: Ability to identify changes in lifestyle to reduce recurrence of condition will improve Ability to verbalize feelings will improve Ability to disclose and discuss suicidal ideas Ability to demonstrate self-control will improve Ability to identify and develop effective coping behaviors will improve Ability to maintain clinical measurements within normal limits will improve Compliance with prescribed medications will improve Ability to identify triggers associated with substance abuse/mental health issues will improve  Medication Management: Evaluate patient's response, side effects, and tolerance of medication regimen.  Therapeutic Interventions: 1 to 1 sessions, Unit Group sessions and Medication administration.  Evaluation of Outcomes: Not Met  Physician Treatment Plan for Secondary Diagnosis: Principal Problem:   Major depressive disorder, recurrent severe without psychotic features ([name])  Long Term Goal(s): Improvement in symptoms so as ready for discharge   Short Term Goals: Ability to identify changes in lifestyle to reduce recurrence of condition will improve Ability to verbalize feelings will improve Ability to disclose and discuss suicidal ideas Ability to demonstrate self-control will improve Ability to identify and develop effective coping behaviors will improve Ability to maintain  clinical measurements within normal limits will improve Compliance with prescribed medications will improve Ability to identify triggers associated with substance abuse/mental health issues will improve     Medication Management: Evaluate patient's response, side effects, and tolerance of medication regimen.  Therapeutic Interventions: 1 to 1 sessions, Unit Group sessions and Medication administration.  Evaluation of Outcomes: Not Met   RN Treatment Plan for Primary Diagnosis: Major depressive disorder, recurrent severe without psychotic features ([place]) Long Term Goal(s): Knowledge of disease and therapeutic regimen to maintain health will improve  Short Term Goals: Ability to remain free from injury will improve, Ability to verbalize frustration and anger appropriately will improve, Ability to demonstrate self-control, Ability to participate in decision making will improve, Ability to verbalize feelings will improve, Ability to disclose and discuss suicidal ideas, Ability to identify and develop effective coping behaviors will improve, and Compliance with prescribed  medications will improve  Medication Management: RN will administer medications as ordered by provider, will assess and evaluate patient's response and provide education to patient for prescribed medication. RN will report any adverse and/or side effects to prescribing provider.  Therapeutic Interventions: 1 on 1 counseling sessions, Psychoeducation, Medication administration, Evaluate responses to treatment, Monitor vital signs and CBGs as ordered, Perform/monitor CIWA, COWS, AIMS and Fall Risk screenings as ordered, Perform wound care treatments as ordered.  Evaluation of Outcomes: Not Met   LCSW Treatment Plan for Primary Diagnosis: Major depressive disorder, recurrent severe without psychotic features ([place]) Long Term Goal(s): Safe transition to appropriate next level of care at discharge, Engage patient in therapeutic group  addressing interpersonal concerns.  Short Term Goals: Engage patient in aftercare planning with referrals and resources, Increase social support, Increase ability to appropriately verbalize feelings, Increase emotional regulation, Facilitate acceptance of mental health diagnosis and concerns, Facilitate patient progression through stages of change regarding substance use diagnoses and concerns, Identify triggers associated with mental health/substance abuse issues, and Increase skills for wellness and recovery  Therapeutic Interventions: Assess for all discharge needs, 1 to 1 time with Social worker, Explore available resources and support systems, Assess for adequacy in community support network, Educate family and significant other(s) on suicide prevention, Complete Psychosocial Assessment, Interpersonal group therapy.  Evaluation of Outcomes: Not Met   Progress in Treatment: Attending groups: Yes. Participating in groups: Yes. Taking medication as prescribed: Yes. Toleration medication: Yes. Family/Significant other contact made: No, will contact:  if given permission. Patient understands diagnosis: Yes. Discussing patient identified problems/goals with staff: Yes. Medical problems stabilized or resolved: Yes. Denies suicidal/homicidal ideation: Yes. Issues/concerns per patient self-inventory: No. Other: none.  New problem(s) identified: No, Describe:  none identified.  New Short Term/Long Term Goal(s):  medication management for mood stabilization; elimination of SI thoughts; development of comprehensive mental wellness/sobriety plan.  Patient Goals:  "Trying get medicine right so I won't be going through these spells again."  Discharge Plan or Barriers: CSW will assist pt with development of an appropriate aftercare/discharge plan.   Reason for Continuation of Hospitalization: Anxiety Depression Medication stabilization Suicidal ideation Withdrawal symptoms  Estimated Length  of Stay:  1-7 days  Last 3 [place] Suicide Severity Risk Score: [place] Admission (Current) from 03/15/2022 in [facility] ED from 03/13/2022 in [place] Admission (Discharged) from 10/29/2021 in [facility] High Risk High Risk Moderate Risk       Last PHQ 2/9 Scores:    05/10/2020    3:58 PM  Depression screen PHQ 2/9  Decreased Interest 0  Down, Depressed, Hopeless 1  PHQ - 2 Score 1  Altered sleeping 1  Tired, decreased energy 0  Change in appetite 0  Feeling bad or failure about yourself  0  Trouble concentrating 1  Moving slowly or fidgety/restless 0  Suicidal thoughts 0  PHQ-9 Score 3  Difficult doing work/chores Somewhat difficult    Scribe for Treatment Team: Shirl Harris, LCSW 03/17/2022 10:01 AM

## 2022-03-17 NOTE — Group Note (Signed)
[facility] LCSW Group Therapy Note    Group Date: 03/17/2022 Start Time: 1300 End Time: 1400  Type of Therapy and Topic:  Group Therapy:  Overcoming Obstacles  Participation Level:  BHH PARTICIPATION LEVEL: None   Description of Group:   In this group patients will be encouraged to explore what they see as obstacles to their own wellness and recovery. They will be guided to discuss their thoughts, feelings, and behaviors related to these obstacles. The group will process together ways to cope with barriers, with attention given to specific choices patients can make. Each patient will be challenged to identify changes they are motivated to make in order to overcome their obstacles. This group will be process-oriented, with patients participating in exploration of their own experiences as well as giving and receiving support and challenge from other group members.  Therapeutic Goals: 1. Patient will identify personal and current obstacles as they relate to admission. 2. Patient will identify barriers that currently interfere with their wellness or overcoming obstacles.  3. Patient will identify feelings, thought process and behaviors related to these barriers. 4. Patient will identify two changes they are willing to make to overcome these obstacles:    Summary of Patient Progress Patient came into group late. Outside of being present for the group he did not participate any further. However, his behavior was appropriate for the group outside of him not participating.    Therapeutic Modalities:   Cognitive Behavioral Therapy Solution Focused Therapy Motivational Interviewing Relapse Prevention Therapy   Glenis Smoker, LCSW

## 2022-03-17 NOTE — Progress Notes (Signed)
Recreation Therapy Notes  INPATIENT RECREATION TR PLAN  Patient Details Name: Lee Sparks MRN: [id number] DOB: 1971-01-19 Today's Date: 03/17/2022  Rec Therapy Plan Is patient appropriate for Therapeutic Recreation?: Yes Treatment times per week: at least 3 Estimated Length of Stay: 5-7 days TR Treatment/Interventions: Group participation (Comment)  Discharge Criteria Pt will be discharged from therapy if:: Discharged Treatment plan/goals/alternatives discussed and agreed upon by:: Patient/family  Discharge Summary     Graceanna Theissen 03/17/2022, 10:38 AM

## 2022-03-17 NOTE — [facility] Counselor (Signed)
CSW contacted the following facilities:  [facility]: informed they will have a bed available tomorrow and asked to send over assessment.   [facility]: will need to fill out an application and fax it back to them along with psych assessment.   [facility]: informed that the person who does intake will not be there after Wednesday. She stated that pt can call for prescreening. Application to be completed and faxed to them as well.  [facility]: was unable to make contact or leave voicemail as the box was full.    CSW faxed assessment to [facility] for review and call back with decision. CSW informed nurse to let pt call [facility] at [phone]. Applications have been printed to give to pt.   Vilma Meckel. [name], MSW, LCSW, LCAS 03/17/2022 3:32 PM

## 2022-03-17 NOTE — Progress Notes (Signed)
Patient was cooperative with treatment, he didn't want to take his Risperdal, pleasant on approach, he denies SI, HI & AVH. Minimal w/d symptoms noted on shift.

## 2022-03-17 NOTE — Plan of Care (Signed)
Patient rated his depression and anxiety 8/10 and presents with some withdrawal symptoms.Medications given as per CIWA. Denies SI,HI and AVH. Appropriate with staff & peers. Visible in the milieu and watching TV. Patient looking forward to go to long term rehab program. Appetite and energy level good. Support and encouragement given.

## 2022-03-17 NOTE — Progress Notes (Signed)
[facility] MD Progress Note  03/17/2022 2:41 PM Lee Sparks  MRN:  [id number] Subjective: Follow-up 51 year old man with depression and alcohol abuse.  Patient reports recent return of depression with suicidal thoughts.  Mood today is a little bit better.  Still feels hopeless about his life but no active suicidality.  Some tremulousness but vital signs were stabilizing and no longer having hallucinations Principal Problem: Major depressive disorder, recurrent severe without psychotic features (HCC) Diagnosis: Principal Problem:   Major depressive disorder, recurrent severe without psychotic features (HCC)  Total Time spent with patient: 30 minutes  Past Psychiatric History: Past history of depression and alcohol abuse  Past Medical History:  Past Medical History:  Diagnosis Date   Alcohol-induced pancreatitis    Anemia    Asthma    Bronchitis     Past Surgical History:  Procedure Laterality Date   CYSTECTOMY     ESOPHAGOGASTRODUODENOSCOPY  07/28/2011   Procedure: ESOPHAGOGASTRODUODENOSCOPY (EGD);  Surgeon: Freddy Jaksch, MD;  Location: [facility] ENDOSCOPY;  Service: Endoscopy;  Laterality: N/A;   HERNIA REPAIR     Family History:  Family History  Problem Relation Age of Onset   Hypertension Father    Aneurysm Father    Cancer Other    Hypertension Mother    Pneumonia Mother    Family Psychiatric  History: See previous Social History:  Social History   Substance and Sexual Activity  Alcohol Use Yes   Alcohol/week: 50.0 standard drinks of alcohol   Types: 50 Cans of beer per week   Comment: Equivalent of two 40 oz beers daily.     Social History   Substance and Sexual Activity  Drug Use No    Social History   Socioeconomic History   Marital status: Single    Spouse name: Not on file   Number of children: Not on file   Years of education: Not on file   Highest education level: Not on file  Occupational History   Not on file  Tobacco Use   Smoking status: Every  Day    Packs/day: 0.50    Years: 15.00    Total pack years: 7.50    Types: Cigarettes    Last attempt to quit: 06/27/2011    Years since quitting: 10.7   Smokeless tobacco: Never  Substance and Sexual Activity   Alcohol use: Yes    Alcohol/week: 50.0 standard drinks of alcohol    Types: 50 Cans of beer per week    Comment: Equivalent of two 40 oz beers daily.   Drug use: No   Sexual activity: Not Currently  Other Topics Concern   Not on file  Social History Narrative   Not on file   Social Determinants of Health   Financial Resource Strain: Not on file  Food Insecurity: Not on file  Transportation Needs: Not on file  Physical Activity: Not on file  Stress: Not on file  Social Connections: Not on file   Additional Social History:                         Sleep: Fair  Appetite:  Fair  Current Medications: Current Facility-Administered Medications  Medication Dose Route Frequency Provider Last Rate Last Admin   acetaminophen (TYLENOL) tablet 650 mg  650 mg Oral Q6H PRN Charm Rings, NP       alum & mag hydroxide-simeth (MAALOX/MYLANTA) 200-200-20 MG/5ML suspension 30 mL  30 mL Oral Q4H PRN Charm Rings,  NP       amLODipine (NORVASC) tablet 5 mg  5 mg Oral Daily Sarina Ill, DO   5 mg at 03/17/22 0749   citalopram (CELEXA) tablet 20 mg  20 mg Oral Daily Sarina Ill, DO   20 mg at 03/17/22 0750   folic acid (FOLVITE) tablet 1 mg  1 mg Oral Daily Sarina Ill, DO   1 mg at 03/17/22 0750   hydrOXYzine (ATARAX) tablet 25 mg  25 mg Oral Q6H PRN Charm Rings, NP   25 mg at 03/17/22 1421   loperamide (IMODIUM) capsule 2-4 mg  2-4 mg Oral PRN Charm Rings, NP       LORazepam (ATIVAN) tablet 1 mg  1 mg Oral Q6H PRN Charm Rings, NP   1 mg at 03/17/22 1231   LORazepam (ATIVAN) tablet 1 mg  1 mg Oral BID Charm Rings, NP   1 mg at 03/17/22 0750   Followed by   Melene Muller ON 03/18/2022] LORazepam (ATIVAN) tablet 1 mg  1 mg Oral  Daily Charm Rings, NP       magnesium hydroxide (MILK OF MAGNESIA) suspension 30 mL  30 mL Oral Daily PRN Charm Rings, NP       nicotine polacrilex (NICORETTE) gum 2 mg  2 mg Oral PRN Sarina Ill, DO   2 mg at 03/17/22 1421   ondansetron (ZOFRAN-ODT) disintegrating tablet 4 mg  4 mg Oral Q6H PRN Charm Rings, NP       QUEtiapine (SEROQUEL) tablet 100 mg  100 mg Oral QHS Jamy Cleckler T, MD       thiamine (VITAMIN B1) tablet 100 mg  100 mg Oral Daily Charm Rings, NP   100 mg at 03/17/22 0750   traZODone (DESYREL) tablet 100 mg  100 mg Oral QHS PRN Charm Rings, NP   100 mg at 03/16/22 2117    Lab Results: No results found for this or any previous visit (from the past 48 hour(s)).  Blood Alcohol level:  Lab Results  Component Value Date   ETH 91 (H) 03/13/2022   ETH 108 (H) 10/29/2021    Metabolic Disorder Labs: Lab Results  Component Value Date   HGBA1C 5.7 (H) 05/30/2020   No results found for: "PROLACTIN" Lab Results  Component Value Date   CHOL 206 (H) 05/30/2020   TRIG 187 (H) 05/30/2020   HDL 28 (L) 05/30/2020   CHOLHDL 7.4 (H) 05/30/2020   LDLCALC 144 (H) 05/30/2020   LDLCALC 98 06/28/2013    Physical Findings: AIMS: Facial and Oral Movements Muscles of Facial Expression: None, normal Lips and Perioral Area: None, normal Jaw: None, normal Tongue: None, normal,Extremity Movements Upper (arms, wrists, hands, fingers): None, normal Lower (legs, knees, ankles, toes): None, normal, Trunk Movements Neck, shoulders, hips: None, normal, Overall Severity Severity of abnormal movements (highest score from questions above): None, normal Incapacitation due to abnormal movements: None, normal Patient's awareness of abnormal movements (rate only patient's report): No Awareness, Dental Status Current problems with teeth and/or dentures?: No Does patient usually wear dentures?: No  CIWA:  CIWA-Ar Total: 12 COWS:     Musculoskeletal: Strength &  Muscle Tone: within normal limits Gait & Station: normal Patient leans: N/A  Psychiatric Specialty Exam:  Presentation  General Appearance: Appropriate for Environment  Eye Contact:Good  Speech:Clear and Coherent  Speech Volume:Normal  Handedness:Right   Mood and Affect  Mood:Depressed  Affect:Congruent   Thought Process  Thought Processes:Coherent  Descriptions of Associations:Intact  Orientation:Full (Time, Place and Person)  Thought Content:Perseveration  History of Schizophrenia/Schizoaffective disorder:No  Duration of Psychotic Symptoms:No data recorded Hallucinations:No data recorded Ideas of Reference:None  Suicidal Thoughts:No data recorded Homicidal Thoughts:No data recorded  Sensorium  Memory:Immediate Good  Judgment:Poor  Insight:Poor   Executive Functions  Concentration:Good  Attention Span:Good  Recall:Good  Fund of Knowledge:Fair  Language:Fair   Psychomotor Activity  Psychomotor Activity:No data recorded  Assets  Assets:Resilience; Physical Health; Social Support   Sleep  Sleep:No data recorded   Physical Exam: Physical Exam Vitals and nursing note reviewed.  Constitutional:      Appearance: Normal appearance.  HENT:     Head: Normocephalic and atraumatic.     Mouth/Throat:     Pharynx: Oropharynx is clear.  Eyes:     Pupils: Pupils are equal, round, and reactive to light.  Cardiovascular:     Rate and Rhythm: Normal rate and regular rhythm.  Pulmonary:     Effort: Pulmonary effort is normal.     Breath sounds: Normal breath sounds.  Abdominal:     General: Abdomen is flat.     Palpations: Abdomen is soft.  Musculoskeletal:        General: Normal range of motion.  Skin:    General: Skin is warm and dry.  Neurological:     General: No focal deficit present.     Mental Status: He is alert. Mental status is at baseline.  Psychiatric:        Attention and Perception: Attention normal.        Mood and  Affect: Mood is anxious and depressed.        Speech: Speech normal.        Behavior: Behavior is cooperative.        Thought Content: Thought content normal.        Cognition and Memory: Cognition normal.    Review of Systems  Constitutional: Negative.   HENT: Negative.    Eyes: Negative.   Respiratory: Negative.    Cardiovascular: Negative.   Gastrointestinal: Negative.   Musculoskeletal: Negative.   Skin: Negative.   Neurological: Negative.   Psychiatric/Behavioral:  Positive for depression and substance abuse. Negative for hallucinations and suicidal ideas. The patient is nervous/anxious.    Blood pressure 123/88, pulse 74, temperature 98.1 F (36.7 C), temperature source Oral, resp. rate 18, height 5' 8.5" (1.74 m), weight 88.9 kg, SpO2 100 %. Body mass index is 29.36 kg/m.   Treatment Plan Summary: Medication management and Plan continue current medication but discontinue Risperdal and replace it with Seroquel which had been more helpful in the past.  Continue citalopram which she finds more tolerable than fluoxetine.  No need to increase anything about the treatment for detox.  Encourage group attendance and participation.  Patient is requesting rehab treatment and we will look into what is available  Mordecai Rasmussen, MD 03/17/2022, 2:41 PM

## 2022-03-18 DIAGNOSIS — F332 Major depressive disorder, recurrent severe without psychotic features: Secondary | ICD-10-CM | POA: Diagnosis not present

## 2022-03-18 MED ORDER — HYDROXYZINE HCL 50 MG PO TABS
50.0000 mg | ORAL_TABLET | Freq: Four times a day (QID) | ORAL | Status: DC | PRN
Start: 2022-03-18 — End: 2022-04-02
  Administered 2022-03-18 – 2022-04-02 (×38): 50 mg via ORAL
  Filled 2022-03-18 (×38): qty 1

## 2022-03-18 NOTE — [facility] Counselor (Signed)
CSW met with pt briefly to check on the status of his applications. Applications were reviewed and pt gave them to CSW. Pt and CSW discussed his thoughts about places and he stated that he thinks that one might be better than the other. CSW encouraged him to focus on working to find placement and then after he finds placement, if there is more than one, we can begin discussions about whether one is better than the other. He agreed. No other concerns expressed. Contact ended without incident.   CSW faxed applications to [employer] and [place] Guerry Bruin, MSW, LCSW, [place] 03/18/2022 4:19 PM

## 2022-03-18 NOTE — Plan of Care (Signed)
D- Patient alert and oriented. Patient presented in a pleasant mood on assessment reporting that he slept "fair" last night and had no complaints to voice to this Clinical research associate. Patient endorsed passive SI, stating that these thoughts "come and go". Patient also endorsed hopelessness, depression and anxiety on his self-inventory, stating that "being in here and worried about when I get out if here", is the reason why he's feeling this way. Patient denied HI/AVH, and pain at this time. Patient's goal for today is "this DT's and getting a long term plan for rehab", in which he will "talk to my counselor", in order to achieve his goal.  A- Scheduled medications administered to patient, per MD orders. Support and encouragement provided.  Routine safety checks conducted every 15 minutes.  Patient informed to notify staff with problems or concerns.  R- No adverse drug reactions noted. Patient contracts for safety at this time. Patient compliant with medications and treatment plan. Patient receptive, calm, and cooperative. Patient interacts well with others on the unit.  Patient remains safe at this time.  Problem: Education: Goal: Knowledge of General Education information will improve Description: Including pain rating scale, medication(s)/side effects and non-pharmacologic comfort measures Outcome: Progressing   Problem: Health Behavior/Discharge Planning: Goal: Ability to manage health-related needs will improve Outcome: Progressing   Problem: Clinical Measurements: Goal: Ability to maintain clinical measurements within normal limits will improve Outcome: Progressing Goal: Will remain free from infection Outcome: Progressing Goal: Diagnostic test results will improve Outcome: Progressing Goal: Respiratory complications will improve Outcome: Progressing Goal: Cardiovascular complication will be avoided Outcome: Progressing   Problem: Activity: Goal: Risk for activity intolerance will  decrease Outcome: Progressing   Problem: Nutrition: Goal: Adequate nutrition will be maintained Outcome: Progressing   Problem: Coping: Goal: Level of anxiety will decrease Outcome: Progressing   Problem: Elimination: Goal: Will not experience complications related to bowel motility Outcome: Progressing Goal: Will not experience complications related to urinary retention Outcome: Progressing   Problem: Pain Managment: Goal: General experience of comfort will improve Outcome: Progressing   Problem: Safety: Goal: Ability to remain free from injury will improve Outcome: Progressing   Problem: Skin Integrity: Goal: Risk for impaired skin integrity will decrease Outcome: Progressing   Problem: Education: Goal: Knowledge of [place] [facility] Education information/materials will improve Outcome: Progressing Goal: Emotional status will improve Outcome: Progressing Goal: Mental status will improve Outcome: Progressing Goal: Verbalization of understanding the information provided will improve Outcome: Progressing   Problem: Activity: Goal: Interest or engagement in activities will improve Outcome: Progressing Goal: Sleeping patterns will improve Outcome: Progressing   Problem: Coping: Goal: Ability to verbalize frustrations and anger appropriately will improve Outcome: Progressing Goal: Ability to demonstrate self-control will improve Outcome: Progressing   Problem: Health Behavior/Discharge Planning: Goal: Identification of resources available to assist in meeting health care needs will improve Outcome: Progressing Goal: Compliance with treatment plan for underlying cause of condition will improve Outcome: Progressing   Problem: Physical Regulation: Goal: Ability to maintain clinical measurements within normal limits will improve Outcome: Progressing   Problem: Safety: Goal: Periods of time without injury will increase Outcome: Progressing   Problem:  Education: Goal: Ability to make informed decisions regarding treatment will improve Outcome: Progressing   Problem: Medication: Goal: Compliance with prescribed medication regimen will improve Outcome: Progressing   Problem: Education: Goal: Utilization of techniques to improve thought processes will improve Outcome: Progressing Goal: Knowledge of the prescribed therapeutic regimen will improve Outcome: Progressing   Problem: Activity: Goal: Interest or engagement  in leisure activities will improve Outcome: Progressing Goal: Imbalance in normal sleep/wake cycle will improve Outcome: Progressing   Problem: Coping: Goal: Coping ability will improve Outcome: Progressing Goal: Will verbalize feelings Outcome: Progressing   Problem: Health Behavior/Discharge Planning: Goal: Ability to make decisions will improve Outcome: Progressing Goal: Compliance with therapeutic regimen will improve Outcome: Progressing   Problem: Role Relationship: Goal: Will demonstrate positive changes in social behaviors and relationships Outcome: Progressing   Problem: Safety: Goal: Ability to disclose and discuss suicidal ideas will improve Outcome: Progressing Goal: Ability to identify and utilize support systems that promote safety will improve Outcome: Progressing   Problem: Self-Concept: Goal: Will verbalize positive feelings about self Outcome: Progressing Goal: Level of anxiety will decrease Outcome: Progressing

## 2022-03-18 NOTE — Progress Notes (Signed)
[facility] MD Progress Note  03/18/2022 2:36 PM Lee Sparks  MRN:  [id number] Subjective: Follow-up patient with depression and anxiety and substance abuse issues.  Patient has complained of anxiety today and would like to get some Vistaril.  Mood overall still dysphoric and nervous but a little bit improved.  No active suicidal thought.  Working on discharge planning Principal Problem: Major depressive disorder, recurrent severe without psychotic features (HCC) Diagnosis: Principal Problem:   Major depressive disorder, recurrent severe without psychotic features (HCC)  Total Time spent with patient: 30 minutes  Past Psychiatric History: Past history of recurrent anxiety and depression and substance use problems  Past Medical History:  Past Medical History:  Diagnosis Date   Alcohol-induced pancreatitis    Anemia    Asthma    Bronchitis     Past Surgical History:  Procedure Laterality Date   CYSTECTOMY     ESOPHAGOGASTRODUODENOSCOPY  07/28/2011   Procedure: ESOPHAGOGASTRODUODENOSCOPY (EGD);  Surgeon: Freddy Jaksch, MD;  Location:  [facility] ENDOSCOPY;  Service: Endoscopy;  Laterality: N/A;   HERNIA REPAIR     Family History:  Family History  Problem Relation Age of Onset   Hypertension Father    Aneurysm Father    Cancer Other    Hypertension Mother    Pneumonia Mother    Family Psychiatric  History: See previous Social History:  Social History   Substance and Sexual Activity  Alcohol Use Yes   Alcohol/week: 50.0 standard drinks of alcohol   Types: 50 Cans of beer per week   Comment: Equivalent of two 40 oz beers daily.     Social History   Substance and Sexual Activity  Drug Use No    Social History   Socioeconomic History   Marital status: Single    Spouse name: Not on file   Number of children: Not on file   Years of education: Not on file   Highest education level: Not on file  Occupational History   Not on file  Tobacco Use   Smoking status: Every Day     Packs/day: 0.50    Years: 15.00    Total pack years: 7.50    Types: Cigarettes    Last attempt to quit: 06/27/2011    Years since quitting: 10.7   Smokeless tobacco: Never  Substance and Sexual Activity   Alcohol use: Yes    Alcohol/week: 50.0 standard drinks of alcohol    Types: 50 Cans of beer per week    Comment: Equivalent of two 40 oz beers daily.   Drug use: No   Sexual activity: Not Currently  Other Topics Concern   Not on file  Social History Narrative   Not on file   Social Determinants of Health   Financial Resource Strain: Not on file  Food Insecurity: Not on file  Transportation Needs: Not on file  Physical Activity: Not on file  Stress: Not on file  Social Connections: Not on file   Additional Social History:                         Sleep: Fair  Appetite:  Fair  Current Medications: Current Facility-Administered Medications  Medication Dose Route Frequency Provider Last Rate Last Admin   acetaminophen (TYLENOL) tablet 650 mg  650 mg Oral Q6H PRN Charm Rings, NP       alum & mag hydroxide-simeth (MAALOX/MYLANTA) 200-200-20 MG/5ML suspension 30 mL  30 mL Oral Q4H PRN Charm Rings,  NP       amLODipine (NORVASC) tablet 5 mg  5 mg Oral Daily Sarina Ill, DO   5 mg at 03/18/22 4332   citalopram (CELEXA) tablet 20 mg  20 mg Oral Daily Sarina Ill, DO   20 mg at 03/18/22 9518   folic acid (FOLVITE) tablet 1 mg  1 mg Oral Daily Sarina Ill, DO   1 mg at 03/18/22 8416   hydrOXYzine (ATARAX) tablet 50 mg  50 mg Oral Q6H PRN Shermaine Rivet, [name], MD       magnesium hydroxide (MILK OF MAGNESIA) suspension 30 mL  30 mL Oral Daily PRN Charm Rings, NP       nicotine polacrilex (NICORETTE) gum 2 mg  2 mg Oral PRN Sarina Ill, DO   2 mg at 03/17/22 1735   QUEtiapine (SEROQUEL) tablet 100 mg  100 mg Oral QHS Oviya Ammar T, MD   100 mg at 03/17/22 2110   thiamine (VITAMIN B1) tablet 100 mg  100 mg Oral Daily  Charm Rings, NP   100 mg at 03/18/22 6063   traZODone (DESYREL) tablet 100 mg  100 mg Oral QHS PRN Charm Rings, NP   100 mg at 03/16/22 2117    Lab Results: No results found for this or any previous visit (from the past 48 hour(s)).  Blood Alcohol level:  Lab Results  Component Value Date   ETH 91 (H) 03/13/2022   ETH 108 (H) 10/29/2021    Metabolic Disorder Labs: Lab Results  Component Value Date   HGBA1C 5.7 (H) 05/30/2020   No results found for: "PROLACTIN" Lab Results  Component Value Date   CHOL 206 (H) 05/30/2020   TRIG 187 (H) 05/30/2020   HDL 28 (L) 05/30/2020   CHOLHDL 7.4 (H) 05/30/2020   LDLCALC 144 (H) 05/30/2020   LDLCALC 98 06/28/2013    Physical Findings: AIMS: Facial and Oral Movements Muscles of Facial Expression: None, normal Lips and Perioral Area: None, normal Jaw: None, normal Tongue: None, normal,Extremity Movements Upper (arms, wrists, hands, fingers): None, normal Lower (legs, knees, ankles, toes): None, normal, Trunk Movements Neck, shoulders, hips: None, normal, Overall Severity Severity of abnormal movements (highest score from questions above): None, normal Incapacitation due to abnormal movements: None, normal Patient's awareness of abnormal movements (rate only patient's report): No Awareness, Dental Status Current problems with teeth and/or dentures?: No Does patient usually wear dentures?: No  CIWA:  CIWA-Ar Total: 1 COWS:     Musculoskeletal: Strength & Muscle Tone: within normal limits Gait & Station: normal Patient leans: N/A  Psychiatric Specialty Exam:  Presentation  General Appearance: Appropriate for Environment  Eye Contact:Good  Speech:Clear and Coherent  Speech Volume:Normal  Handedness:Right   Mood and Affect  Mood:Depressed  Affect:Congruent   Thought Process  Thought Processes:Coherent  Descriptions of Associations:Intact  Orientation:Full (Time, Place and Person)  Thought  Content:Perseveration  History of Schizophrenia/Schizoaffective disorder:No  Duration of Psychotic Symptoms:No data recorded Hallucinations:No data recorded Ideas of Reference:None  Suicidal Thoughts:No data recorded Homicidal Thoughts:No data recorded  Sensorium  Memory:Immediate Good  Judgment:Poor  Insight:Poor   Executive Functions  Concentration:Good  Attention Span:Good  Recall:Good  Fund of Knowledge:Fair  Language:Fair   Psychomotor Activity  Psychomotor Activity:No data recorded  Assets  Assets:Resilience; Physical Health; Social Support   Sleep  Sleep:No data recorded   Physical Exam: Physical Exam Vitals and nursing note reviewed.  Constitutional:      Appearance: Normal appearance.  HENT:  Head: Normocephalic and atraumatic.     Mouth/Throat:     Pharynx: Oropharynx is clear.  Eyes:     Pupils: Pupils are equal, round, and reactive to light.  Cardiovascular:     Rate and Rhythm: Normal rate and regular rhythm.  Pulmonary:     Effort: Pulmonary effort is normal.     Breath sounds: Normal breath sounds.  Abdominal:     General: Abdomen is flat.     Palpations: Abdomen is soft.  Musculoskeletal:        General: Normal range of motion.  Skin:    General: Skin is warm and dry.  Neurological:     General: No focal deficit present.     Mental Status: He is alert. Mental status is at baseline.  Psychiatric:        Attention and Perception: Attention normal.        Mood and Affect: Mood is anxious.        Thought Content: Thought content normal.    Review of Systems  Constitutional: Negative.   HENT: Negative.    Eyes: Negative.   Respiratory: Negative.    Cardiovascular: Negative.   Gastrointestinal: Negative.   Musculoskeletal: Negative.   Skin: Negative.   Neurological: Negative.   Psychiatric/Behavioral:  Negative for depression and suicidal ideas.    Blood pressure 126/87, pulse (!) 101, temperature 98 F (36.7 C),  temperature source Oral, resp. rate 20, height 5' 8.5" (1.74 m), weight 88.9 kg, SpO2 98 %. Body mass index is 29.36 kg/m.   Treatment Plan Summary: Medication management and Plan add Vistaril.  Continue alcohol detox.  Continue to work with treatment team on referral to rehab programs  Alethia Berthold, MD 03/18/2022, 2:36 PM

## 2022-03-18 NOTE — Progress Notes (Signed)
Patient requested PRN medication for anxiety. Patient tolerated medication well, without any issues. Patient remains safe on the unit.

## 2022-03-18 NOTE — Group Note (Signed)
[facility] LCSW Group Therapy Note   Group Date: 03/18/2022 Start Time: 1300 End Time: 1400  Type of Therapy/Topic:  Group Therapy:  Feelings about Diagnosis  Participation Level:  None    Description of Group:    This group will allow patients to explore their thoughts and feelings about diagnoses they have received. Patients will be guided to explore their level of understanding and acceptance of these diagnoses. Facilitator will encourage patients to process their thoughts and feelings about the reactions of others to their diagnosis, and will guide patients in identifying ways to discuss their diagnosis with significant others in their lives. This group will be process-oriented, with patients participating in exploration of their own experiences as well as giving and receiving support and challenge from other group members.   Therapeutic Goals: 1. Patient will demonstrate understanding of diagnosis as evidence by identifying two or more symptoms of the disorder:  2. Patient will be able to express two feelings regarding the diagnosis 3. Patient will demonstrate ability to communicate their needs through discussion and/or role plays  Summary of Patient Progress: Patient was present for the entirety of the group process. However, he did not participate in the discussion. His behavior during the group was appropriate.    Therapeutic Modalities:   Cognitive Behavioral Therapy Brief Therapy Feelings Identification    Glenis Smoker, LCSW

## 2022-03-18 NOTE — Progress Notes (Signed)
Patient compliant with medications no adverse effects noted. Interacting well with  Peers and Staff continue to endorse VH denies SI/HI/AH and verbally contracted for safety. Q 15 minutes safety checks ongoing. Patient remains safe.

## 2022-03-19 DIAGNOSIS — F332 Major depressive disorder, recurrent severe without psychotic features: Secondary | ICD-10-CM | POA: Diagnosis not present

## 2022-03-19 NOTE — Progress Notes (Signed)
Patient is alert and oriented times 4. Mood and affect appropriate. Patient rates pain as 0/10. He denies SI, HI, and AVH. Patient does endorse feelings of anxiety and depression at this time. States he slept well last night.  Patient ate snack in day room; appetite was fair. Patient remains on unit with Q15 minute checks in place.    

## 2022-03-19 NOTE — Plan of Care (Signed)
Patient states that his anxiety is not improving much but Vistaril helps him calm down. Patient stated that he could sleep good last night, will try Trazodone tonight. Patient presents with minimal withdrawal symptoms. Denies SI,HI and AVH. Visible in the milieu. Appropriate with staff & peers. Appetite and energy level good. Attended groups. Support and encouragement given.

## 2022-03-19 NOTE — Progress Notes (Signed)
Recreation Therapy Notes  Date: 03/19/2022   Time: 10:20 am     Location: Courtyard   Behavioral response: Appropriate  Intervention Topic:  Wellness      Discussion/Intervention:  Group content today was focused on Wellness. The group defined wellness and some positive ways they make decisions for themselves. Individuals expressed reasons why they neglected any wellness in the past. Patients described ways to improve wellness skills in the future. The group explained what could happen if they did not do any wellness at all. Participants express how bad choices has affected them and others around them. Individual explained the importance of wellness. The group participated in the intervention "Testing my Wellness" where they had a chance to identify some of their weaknesses and strengths in wellness.  Clinical Observations/Feedback: Patient came to group and was focused on what peers and staff had to say about wellness. Individual was social with peers and staff while participating in the intervention.  Lee Sparks LRT/CTRS         Cyniah Gossard 03/19/2022 12:19 PM

## 2022-03-19 NOTE — Group Note (Signed)
[facility] LCSW Group Therapy Note   Group Date: 03/19/2022 Start Time: 1300 End Time: 1400   Type of Therapy/Topic:  Group Therapy:  Emotion Regulation  Participation Level:  Minimal    Description of Group:    The purpose of this group is to assist patients in learning to regulate negative emotions and experience positive emotions. Patients will be guided to discuss ways in which they have been vulnerable to their negative emotions. These vulnerabilities will be juxtaposed with experiences of positive emotions or situations, and patients challenged to use positive emotions to combat negative ones. Special emphasis will be placed on coping with negative emotions in conflict situations, and patients will process healthy conflict resolution skills.  Therapeutic Goals: Patient will identify two positive emotions or experiences to reflect on in order to balance out negative emotions:  Patient will label two or more emotions that they find the most difficult to experience:  Patient will be able to demonstrate positive conflict resolution skills through discussion or role plays:   Summary of Patient Progress: Patient was present for the entirety of the meeting. His behavior was appropriate and he appeared to attend to the conversation. However, pt only spoke during the icebreaker and outside of this did not speak directly to the conversation any further. Pt appeared receptive to feedback/comments from both peers and facilitator.    Therapeutic Modalities:   Cognitive Behavioral Therapy Feelings Identification Dialectical Behavioral Therapy   Glenis Smoker, LCSW

## 2022-03-19 NOTE — Progress Notes (Signed)
Patient calm and pleasant during assessment. Pt denies SI/HI/AVH. Pt stated he was feeling better tonight. Pt compliant with medication administration per MD orders. Pt observed interacting appropriately with staff and peers on the unit. Pt given education, support, and encouragement to be active in his treatment plan. Pt being monitored Q 15 minutes for safety per unit protocol. Pt remains safe on the unit.  

## 2022-03-19 NOTE — Progress Notes (Signed)
[facility] MD Progress Note  03/19/2022 1:42 PM Lee Sparks  MRN:  [id number] Subjective: Follow-up patient with depression and substance abuse.  Patient says he is still feeling bad.  Anxious and nervous most of the time.  Passive hopelessness but no active intent to harm himself.  Still anxious but no real alcohol withdrawal symptoms. Principal Problem: Major depressive disorder, recurrent severe without psychotic features (HCC) Diagnosis: Principal Problem:   Major depressive disorder, recurrent severe without psychotic features (HCC)  Total Time spent with patient: 30 minutes  Past Psychiatric History: Past history of depression and alcohol abuse  Past Medical History:  Past Medical History:  Diagnosis Date   Alcohol-induced pancreatitis    Anemia    Asthma    Bronchitis     Past Surgical History:  Procedure Laterality Date   CYSTECTOMY     ESOPHAGOGASTRODUODENOSCOPY  07/28/2011   Procedure: ESOPHAGOGASTRODUODENOSCOPY (EGD);  Surgeon: Freddy Jaksch, MD;  [facility]: [facility] ENDOSCOPY;  Service: Endoscopy;  Laterality: N/A;   HERNIA REPAIR     Family History:  Family History  Problem Relation Age of Onset   Hypertension Father    Aneurysm Father    Cancer Other    Hypertension Mother    Pneumonia Mother    Family Psychiatric  History: See previous Social History:  Social History   Substance and Sexual Activity  Alcohol Use Yes   Alcohol/week: 50.0 standard drinks of alcohol   Types: 50 Cans of beer per week   Comment: Equivalent of two 40 oz beers daily.     Social History   Substance and Sexual Activity  Drug Use No    Social History   Socioeconomic History   Marital status: Single    Spouse name: Not on file   Number of children: Not on file   Years of education: Not on file   Highest education level: Not on file  Occupational History   Not on file  Tobacco Use   Smoking status: Every Day    Packs/day: 0.50    Years: 15.00    Total pack years: 7.50     Types: Cigarettes    Last attempt to quit: 06/27/2011    Years since quitting: 10.7   Smokeless tobacco: Never  Substance and Sexual Activity   Alcohol use: Yes    Alcohol/week: 50.0 standard drinks of alcohol    Types: 50 Cans of beer per week    Comment: Equivalent of two 40 oz beers daily.   Drug use: No   Sexual activity: Not Currently  Other Topics Concern   Not on file  Social History Narrative   Not on file   Social Determinants of Health   Financial Resource Strain: Not on file  Food Insecurity: Not on file  Transportation Needs: Not on file  Physical Activity: Not on file  Stress: Not on file  Social Connections: Not on file   Additional Social History:                         Sleep: Fair  Appetite:  Fair  Current Medications: Current Facility-Administered Medications  Medication Dose Route Frequency Provider Last Rate Last Admin   acetaminophen (TYLENOL) tablet 650 mg  650 mg Oral Q6H PRN Charm Rings, NP       alum & mag hydroxide-simeth (MAALOX/MYLANTA) 200-200-20 MG/5ML suspension 30 mL  30 mL Oral Q4H PRN Charm Rings, NP       amLODipine (  NORVASC) tablet 5 mg  5 mg Oral Daily Sarina Ill, DO   5 mg at 03/19/22 0818   citalopram (CELEXA) tablet 20 mg  20 mg Oral Daily Sarina Ill, DO   20 mg at 03/19/22 4431   folic acid (FOLVITE) tablet 1 mg  1 mg Oral Daily Sarina Ill, DO   1 mg at 03/19/22 0815   hydrOXYzine (ATARAX) tablet 50 mg  50 mg Oral Q6H PRN Kaylenn Civil, [name], MD   50 mg at 03/19/22 5400   magnesium hydroxide (MILK OF MAGNESIA) suspension 30 mL  30 mL Oral Daily PRN Charm Rings, NP       nicotine polacrilex (NICORETTE) gum 2 mg  2 mg Oral PRN Sarina Ill, DO   2 mg at 03/18/22 1626   QUEtiapine (SEROQUEL) tablet 100 mg  100 mg Oral QHS Aldyn Toon T, MD   100 mg at 03/18/22 2120   thiamine (VITAMIN B1) tablet 100 mg  100 mg Oral Daily Charm Rings, NP   100 mg at 03/19/22 0815    traZODone (DESYREL) tablet 100 mg  100 mg Oral QHS PRN Charm Rings, NP   100 mg at 03/16/22 2117    Lab Results: No results found for this or any previous visit (from the past 48 hour(s)).  Blood Alcohol level:  Lab Results  Component Value Date   ETH 91 (H) 03/13/2022   ETH 108 (H) 10/29/2021    Metabolic Disorder Labs: Lab Results  Component Value Date   HGBA1C 5.7 (H) 05/30/2020   No results found for: "PROLACTIN" Lab Results  Component Value Date   CHOL 206 (H) 05/30/2020   TRIG 187 (H) 05/30/2020   HDL 28 (L) 05/30/2020   CHOLHDL 7.4 (H) 05/30/2020   LDLCALC 144 (H) 05/30/2020   LDLCALC 98 06/28/2013    Physical Findings: AIMS: Facial and Oral Movements Muscles of Facial Expression: None, normal Lips and Perioral Area: None, normal Jaw: None, normal Tongue: None, normal,Extremity Movements Upper (arms, wrists, hands, fingers): None, normal Lower (legs, knees, ankles, toes): None, normal, Trunk Movements Neck, shoulders, hips: None, normal, Overall Severity Severity of abnormal movements (highest score from questions above): None, normal Incapacitation due to abnormal movements: None, normal Patient's awareness of abnormal movements (rate only patient's report): No Awareness, Dental Status Current problems with teeth and/or dentures?: No Does patient usually wear dentures?: No  CIWA:  CIWA-Ar Total: 0 COWS:     Musculoskeletal: Strength & Muscle Tone: within normal limits Gait & Station: normal Patient leans: N/A  Psychiatric Specialty Exam:  Presentation  General Appearance: Appropriate for Environment  Eye Contact:Good  Speech:Clear and Coherent  Speech Volume:Normal  Handedness:Right   Mood and Affect  Mood:Depressed  Affect:Congruent   Thought Process  Thought Processes:Coherent  Descriptions of Associations:Intact  Orientation:Full (Time, Place and Person)  Thought Content:Perseveration  History of  Schizophrenia/Schizoaffective disorder:No  Duration of Psychotic Symptoms:No data recorded Hallucinations:No data recorded Ideas of Reference:None  Suicidal Thoughts:No data recorded Homicidal Thoughts:No data recorded  Sensorium  Memory:Immediate Good  Judgment:Poor  Insight:Poor   Executive Functions  Concentration:Good  Attention Span:Good  Recall:Good  Fund of Knowledge:Fair  Language:Fair   Psychomotor Activity  Psychomotor Activity:No data recorded  Assets  Assets:Resilience; Physical Health; Social Support   Sleep  Sleep:No data recorded   Physical Exam: Physical Exam Vitals and nursing note reviewed.  Constitutional:      Appearance: Normal appearance.  HENT:     Head:  Normocephalic and atraumatic.     Mouth/Throat:     Pharynx: Oropharynx is clear.  Eyes:     Pupils: Pupils are equal, round, and reactive to light.  Cardiovascular:     Rate and Rhythm: Normal rate and regular rhythm.  Pulmonary:     Effort: Pulmonary effort is normal.     Breath sounds: Normal breath sounds.  Abdominal:     General: Abdomen is flat.     Palpations: Abdomen is soft.  Musculoskeletal:        General: Normal range of motion.  Skin:    General: Skin is warm and dry.  Neurological:     General: No focal deficit present.     Mental Status: He is alert. Mental status is at baseline.  Psychiatric:        Attention and Perception: Attention normal.        Mood and Affect: Mood is depressed.        Speech: Speech normal.        Behavior: Behavior normal.        Thought Content: Thought content includes suicidal ideation. Thought content does not include suicidal plan.        Cognition and Memory: Cognition normal.    Review of Systems  Constitutional: Negative.   HENT: Negative.    Eyes: Negative.   Respiratory: Negative.    Cardiovascular: Negative.   Gastrointestinal: Negative.   Musculoskeletal: Negative.   Skin: Negative.   Neurological: Negative.    Psychiatric/Behavioral:  Positive for depression.    Blood pressure 125/84, pulse 70, temperature 98.7 F (37.1 C), temperature source Oral, resp. rate 18, height 5' 8.5" (1.74 m), weight 88.9 kg, SpO2 99 %. Body mass index is 29.36 kg/m.   Treatment Plan Summary: Plan no change to medication.  Supportive therapy.  Encouragement.  Encourage patient to follow up with social work about possible rehab options.  Mordecai Rasmussen, MD 03/19/2022, 1:42 PM

## 2022-03-19 NOTE — [facility] Suicide Risk Assessment (Signed)
[facility] INPATIENT:  Family/Significant Other Suicide Prevention Education  Suicide Prevention Education:  Patient Refusal for Family/Significant Other Suicide Prevention Education: The patient Lee Sparks has refused to provide written consent for family/significant other to be provided Family/Significant Other Suicide Prevention Education during admission and/or prior to discharge.  Physician notified.  SPE completed with pt, as pt refused to consent to family contact. SPI pamphlet provided to pt and pt was encouraged to share information with support network, ask questions, and talk about any concerns relating to SPE. Pt denies access to guns/firearms and verbalized understanding of information provided. Mobile Crisis information also provided to pt.  Glenis Smoker 03/19/2022, 3:16 PM

## 2022-03-19 NOTE — [facility] Group Notes (Signed)
[facility] Group Notes:  (Nursing/MHT/Case Management/Adjunct)  Date:  03/19/2022  Time:  10:34 PM  Type of Therapy:   Wrap up  Participation Level:  Active  Participation Quality:  Appropriate  Affect:  Appropriate  Cognitive:  Alert  Insight:  Good  Engagement in Group:  When he get out of here he want to go to rehab.  Modes of Intervention:  Support  Summary of Progress/Problems:  Mayra Neer 03/19/2022, 10:34 PM

## 2022-03-19 NOTE — [facility] Group Notes (Signed)
[facility] Group Notes:  (Nursing/MHT/Case Management/Adjunct)  Date:  03/19/2022  Time:  12:37 AM  Type of Therapy:  Group Therapy  Participation Level:  Active  Participation Quality:  Appropriate and Attentive  Affect:  Flat  Cognitive:  Alert and Appropriate  Insight:  Appropriate  Engagement in Group:  Engaged  Modes of Intervention:  Discussion  Summary of Progress/Problems:  [name],Lee Sparks 03/19/2022, 12:37 AM

## 2022-03-20 DIAGNOSIS — F332 Major depressive disorder, recurrent severe without psychotic features: Secondary | ICD-10-CM | POA: Diagnosis not present

## 2022-03-20 MED ORDER — QUETIAPINE FUMARATE 200 MG PO TABS
200.0000 mg | ORAL_TABLET | Freq: Every day | ORAL | Status: DC
  Administered 2022-03-20 – 2022-04-01 (×13): 200 mg via ORAL
  Filled 2022-03-20 (×13): qty 1

## 2022-03-20 NOTE — [facility] Group Notes (Signed)
[facility] Group Notes:  (Nursing/MHT/Case Management/Adjunct)  Date:  03/20/2022  Time:  10:52 AM  Type of Therapy:   community meeting  Participation Level:  Did Not Attend    Rodena Goldmann 03/20/2022, 10:52 AM

## 2022-03-20 NOTE — Progress Notes (Signed)
[facility] MD Progress Note  03/20/2022 11:21 AM Lee Sparks  MRN:  [id number] Subjective: Follow-up 51 year old man with severe depression and anxiety and substance use problems.  Patient reports still feeling anxious.  Had some trouble sleeping last night.  Felt like his thoughts were racing.  No active suicidal intent but still has some hopelessness.  Making an effort to involve himself more on the unit. Principal Problem: Major depressive disorder, recurrent severe without psychotic features (HCC) Diagnosis: Principal Problem:   Major depressive disorder, recurrent severe without psychotic features (HCC)  Total Time spent with patient: 30 minutes  Past Psychiatric History: Patient has a past history of depression and anxiety complicated by substance use.  Past history of suicidal thinking and behavior.  Medical problems.  Past Medical History:  Past Medical History:  Diagnosis Date   Alcohol-induced pancreatitis    Anemia    Asthma    Bronchitis     Past Surgical History:  Procedure Laterality Date   CYSTECTOMY     ESOPHAGOGASTRODUODENOSCOPY  07/28/2011   Procedure: ESOPHAGOGASTRODUODENOSCOPY (EGD);  Surgeon: Freddy Jaksch, MD;  Location: [facility] ENDOSCOPY;  Service: Endoscopy;  Laterality: N/A;   HERNIA REPAIR     Family History:  Family History  Problem Relation Age of Onset   Hypertension Father    Aneurysm Father    Cancer Other    Hypertension Mother    Pneumonia Mother    Family Psychiatric  History: See previous Social History:  Social History   Substance and Sexual Activity  Alcohol Use Yes   Alcohol/week: 50.0 standard drinks of alcohol   Types: 50 Cans of beer per week   Comment: Equivalent of two 40 oz beers daily.     Social History   Substance and Sexual Activity  Drug Use No    Social History   Socioeconomic History   Marital status: Single    Spouse name: Not on file   Number of children: Not on file   Years of education: Not on file   Highest  education level: Not on file  Occupational History   Not on file  Tobacco Use   Smoking status: Every Day    Packs/day: 0.50    Years: 15.00    Total pack years: 7.50    Types: Cigarettes    Last attempt to quit: 06/27/2011    Years since quitting: 10.7   Smokeless tobacco: Never  Substance and Sexual Activity   Alcohol use: Yes    Alcohol/week: 50.0 standard drinks of alcohol    Types: 50 Cans of beer per week    Comment: Equivalent of two 40 oz beers daily.   Drug use: No   Sexual activity: Not Currently  Other Topics Concern   Not on file  Social History Narrative   Not on file   Social Determinants of Health   Financial Resource Strain: Not on file  Food Insecurity: Not on file  Transportation Needs: Not on file  Physical Activity: Not on file  Stress: Not on file  Social Connections: Not on file   Additional Social History:                         Sleep: Fair  Appetite:  Fair  Current Medications: Current Facility-Administered Medications  Medication Dose Route Frequency Provider Last Rate Last Admin   acetaminophen (TYLENOL) tablet 650 mg  650 mg Oral Q6H PRN Charm Rings, NP  alum & mag hydroxide-simeth (MAALOX/MYLANTA) 200-200-20 MG/5ML suspension 30 mL  30 mL Oral Q4H PRN Charm Rings, NP       amLODipine (NORVASC) tablet 5 mg  5 mg Oral Daily Sarina Ill, DO   5 mg at 03/20/22 0831   citalopram (CELEXA) tablet 20 mg  20 mg Oral Daily Sarina Ill, DO   20 mg at 03/20/22 0831   folic acid (FOLVITE) tablet 1 mg  1 mg Oral Daily Sarina Ill, DO   1 mg at 03/20/22 0831   hydrOXYzine (ATARAX) tablet 50 mg  50 mg Oral Q6H PRN Oma Marzan T, MD   50 mg at 03/20/22 1120   magnesium hydroxide (MILK OF MAGNESIA) suspension 30 mL  30 mL Oral Daily PRN Charm Rings, NP       nicotine polacrilex (NICORETTE) gum 2 mg  2 mg Oral PRN Sarina Ill, DO   2 mg at 03/20/22 1120   QUEtiapine (SEROQUEL)  tablet 200 mg  200 mg Oral QHS Nancie Bocanegra T, MD       thiamine (VITAMIN B1) tablet 100 mg  100 mg Oral Daily Charm Rings, NP   100 mg at 03/20/22 0831   traZODone (DESYREL) tablet 100 mg  100 mg Oral QHS PRN Charm Rings, NP   100 mg at 03/16/22 2117    Lab Results: No results found for this or any previous visit (from the past 48 hour(s)).  Blood Alcohol level:  Lab Results  Component Value Date   ETH 91 (H) 03/13/2022   ETH 108 (H) 10/29/2021    Metabolic Disorder Labs: Lab Results  Component Value Date   HGBA1C 5.7 (H) 05/30/2020   No results found for: "PROLACTIN" Lab Results  Component Value Date   CHOL 206 (H) 05/30/2020   TRIG 187 (H) 05/30/2020   HDL 28 (L) 05/30/2020   CHOLHDL 7.4 (H) 05/30/2020   LDLCALC 144 (H) 05/30/2020   LDLCALC 98 06/28/2013    Physical Findings: AIMS: Facial and Oral Movements Muscles of Facial Expression: None, normal Lips and Perioral Area: None, normal Jaw: None, normal Tongue: None, normal,Extremity Movements Upper (arms, wrists, hands, fingers): None, normal Lower (legs, knees, ankles, toes): None, normal, Trunk Movements Neck, shoulders, hips: None, normal, Overall Severity Severity of abnormal movements (highest score from questions above): None, normal Incapacitation due to abnormal movements: None, normal Patient's awareness of abnormal movements (rate only patient's report): No Awareness, Dental Status Current problems with teeth and/or dentures?: No Does patient usually wear dentures?: No  CIWA:  CIWA-Ar Total: 2 COWS:     Musculoskeletal: Strength & Muscle Tone: within normal limits Gait & Station: normal Patient leans: N/A  Psychiatric Specialty Exam:  Presentation  General Appearance: Appropriate for Environment  Eye Contact:Good  Speech:Clear and Coherent  Speech Volume:Normal  Handedness:Right   Mood and Affect  Mood:Depressed  Affect:Congruent   Thought Process  Thought  Processes:Coherent  Descriptions of Associations:Intact  Orientation:Full (Time, Place and Person)  Thought Content:Perseveration  History of Schizophrenia/Schizoaffective disorder:No  Duration of Psychotic Symptoms:No data recorded Hallucinations:No data recorded Ideas of Reference:None  Suicidal Thoughts:No data recorded Homicidal Thoughts:No data recorded  Sensorium  Memory:Immediate Good  Judgment:Poor  Insight:Poor   Executive Functions  Concentration:Good  Attention Span:Good  Recall:Good  Fund of Knowledge:Fair  Language:Fair   Psychomotor Activity  Psychomotor Activity:No data recorded  Assets  Assets:Resilience; Physical Health; Social Support   Sleep  Sleep:No data recorded   Physical Exam:  Physical Exam Vitals and nursing note reviewed.  Constitutional:      Appearance: Normal appearance.  HENT:     Head: Normocephalic and atraumatic.     Mouth/Throat:     Pharynx: Oropharynx is clear.  Eyes:     Pupils: Pupils are equal, round, and reactive to light.  Cardiovascular:     Rate and Rhythm: Normal rate and regular rhythm.  Pulmonary:     Effort: Pulmonary effort is normal.     Breath sounds: Normal breath sounds.  Abdominal:     General: Abdomen is flat.     Palpations: Abdomen is soft.  Musculoskeletal:        General: Normal range of motion.  Skin:    General: Skin is warm and dry.  Neurological:     General: No focal deficit present.     Mental Status: He is alert. Mental status is at baseline.  Psychiatric:        Attention and Perception: Attention normal.        Mood and Affect: Mood is anxious.        Speech: Speech normal.        Behavior: Behavior is cooperative.        Thought Content: Thought content normal.        Cognition and Memory: Cognition normal.    Review of Systems  Constitutional: Negative.   HENT: Negative.    Eyes: Negative.   Respiratory: Negative.    Cardiovascular: Negative.    Gastrointestinal: Negative.   Musculoskeletal: Negative.   Skin: Negative.   Neurological: Negative.   Psychiatric/Behavioral:  Positive for depression. The patient is nervous/anxious.    Blood pressure 122/85, pulse 70, temperature 98.1 F (36.7 C), temperature source Oral, resp. rate 18, height 5' 8.5" (1.74 m), weight 88.9 kg, SpO2 98 %. Body mass index is 29.36 kg/m.   Treatment Plan Summary: Medication management and Plan increase dose of Seroquel at night for sleep and anxiety.  Supportive therapy and counseling.  Reviewed with him his desire to go to rehab.  Staff still working on following up with various agencies.  Encourage group attendance.  Mordecai Rasmussen, MD 03/20/2022, 11:21 AM

## 2022-03-20 NOTE — BHH Counselor (Signed)
CSW contacted facilities for which referrals had been sent with the following results:  [facility]: was informed that pt was to call yesterday morning as their intake specialist would be going out for surgery and would be out for six weeks. [facility] stated that since they did not hear from him it would be weeks before they would be admitting people again.   [facility]: CSW was informed that they never received pt's assessment. They asked that it be faxed over again and informed that pt needed to call them for screening as well. CSW was informed that pt was not appropriate for detox but potentially appropriate for residential component. CSW agreed. No other concerns expressed. Contact ended without incident.   [facility]: It was confirmed that they received paperwork. CSW was informed that pt would need to call tomorrow morning (03/21/22) at 09:00 for interview. CSW agreed.  No other concerns expressed. Contact ended without incident.   CSW updated pt about contacts and follow up interview/screenings. Pt contacted [facility].   CSW will follow up.   Vilma Meckel. [name], MSW, LCSW, LCAS 03/20/2022 11:09 AM

## 2022-03-20 NOTE — Progress Notes (Addendum)
Pt presents with anxious mood, affect congruent. Lee Sparks reports he is doing well overall, just feels anxious because he would like to be accepted into a residential substance use program and has not been able to secure a place. He states he is feeling ok physically and denies any immediate concerns. Pt completed self inventory today and rated his depression at 7/10 on scale 10 being worst and 0 being none. He rates his anxiety, and hopelessness at 7/10 on same scale. Pt has been observed on the unit , eating and appropriate with peers. Will con't to monitor. Pt is safe. Denies any SI or HI or A/V Hallucinations.  1130-Pt reported increased anxiety after group - prn vistaril given as well as nicorette gum. Pt also educated on other coping mechanisms to address anxiety and deep breathing practiced with patient. He was seen squeezing stress ball after group as well. Pt states yeah before I got heavy into drinking my anxiety wasn't like this. Pt given education and encouragement, and reported feeling better. Pt is safe, will con't to monitor.

## 2022-03-20 NOTE — Progress Notes (Signed)
Patient calm and pleasant during assessment. Pt denies SI/HI/AVH. Pt stated he was feeling better tonight. Pt compliant with medication administration per MD orders. Pt observed interacting appropriately with staff and peers on the unit. Pt given education, support, and encouragement to be active in his treatment plan. Pt being monitored Q 15 minutes for safety per unit protocol. Pt remains safe on the unit.  

## 2022-03-20 NOTE — Progress Notes (Signed)
Recreation Therapy Notes   Date: 03/20/2022   Time: 10:35 am     Location: Craft room  Behavioral response: Appropriate  Intervention Topic:  Stress Management   Discussion/Intervention:  Group content on today was focused on stress. The group defined stress and way to cope with stress. Participants expressed how they know when they are stresses out. Individuals described the different ways they have to cope with stress. The group stated reasons why it is important to cope with stress. Patient explained what good stress is and some examples. The group participated in the intervention "Stress Management". Individuals were separated into two group and answered questions related to stress.  Clinical Observations/Feedback: Patient came to group and identified listening to music as a way he manages his stress. Individual was social with peers and staff while participating in the intervention.  Kasondra Junod LRT/CTRS        Sarahbeth Cashin 03/20/2022 12:03 PM        Susie Ehresman 03/20/2022 12:03 PM

## 2022-03-20 NOTE — Group Note (Signed)
[facility] LCSW Group Therapy Note   Group Date: 03/20/2022 Start Time: 1300 End Time: 1400   Type of Therapy/Topic:  Group Therapy:  Balance in Life  Participation Level:  Minimal   Description of Group:    This group will address the concept of balance and how it feels and looks when one is unbalanced. Patients will be encouraged to process areas in their lives that are out of balance, and identify reasons for remaining unbalanced. Facilitators will guide patients utilizing problem- solving interventions to address and correct the stressor making their life unbalanced. Understanding and applying boundaries will be explored and addressed for obtaining  and maintaining a balanced life. Patients will be encouraged to explore ways to assertively make their unbalanced needs known to significant others in their lives, using other group members and facilitator for support and feedback.  Therapeutic Goals: Patient will identify two or more emotions or situations they have that consume much of in their lives. Patient will identify signs/triggers that life has become out of balance:  Patient will identify two ways to set boundaries in order to achieve balance in their lives:  Patient will demonstrate ability to communicate their needs through discussion and/or role plays  Summary of Patient Progress: Patient was present for the entirety of the meeting. He participated in the icebreaker and then towards the end of group he became more verbal. He shared that too much responsibility is something that causes him to become off balanced. Pt acknowledged that his family, specifically his daughter, pushes his buttons and once he begins to drink things go downhill from there. He stated that moving forward he needs to pray and give his problems over to his higher power, acknowledging that when he attempts to take over it is problematic. Pt appeared open and receptive to feedback/comments from both peers and facilitator.     Therapeutic Modalities:   Cognitive Behavioral Therapy Solution-Focused Therapy Assertiveness Training   Glenis Smoker, LCSW

## 2022-03-21 DIAGNOSIS — F332 Major depressive disorder, recurrent severe without psychotic features: Secondary | ICD-10-CM | POA: Diagnosis not present

## 2022-03-21 MED ORDER — LORATADINE 10 MG PO TABS
10.0000 mg | ORAL_TABLET | Freq: Every day | ORAL | Status: DC
  Administered 2022-03-21 – 2022-04-02 (×13): 10 mg via ORAL
  Filled 2022-03-21 (×13): qty 1

## 2022-03-21 NOTE — Progress Notes (Signed)
Patient calm and pleasant during assessment. Pt denies SI/HI/AVH. Pt stated he was feeling better tonight. Pt compliant with medication administration per MD orders. Pt observed interacting appropriately with staff and peers on the unit. Pt given education, support, and encouragement to be active in his treatment plan. Pt being monitored Q 15 minutes for safety per unit protocol. Pt remains safe on the unit.

## 2022-03-21 NOTE — Group Note (Signed)
[facility] LCSW Group Therapy Note   Group Date: 03/21/2022 Start Time: 1300 End Time: 1400  Type of Therapy and Topic:  Group Therapy:  Feelings around Relapse and Recovery  Participation Level:  Active    Description of Group:    Patients in this group will discuss emotions they experience before and after a relapse. They will process how experiencing these feelings, or avoidance of experiencing them, relates to having a relapse. Facilitator will guide patients to explore emotions they have related to recovery. Patients will be encouraged to process which emotions are more powerful. They will be guided to discuss the emotional reaction significant others in their lives may have to patients' relapse or recovery. Patients will be assisted in exploring ways to respond to the emotions of others without this contributing to a relapse.  Therapeutic Goals: Patient will identify two or more emotions that lead to relapse for them:  Patient will identify two emotions that result when they relapse:  Patient will identify two emotions related to recovery:  Patient will demonstrate ability to communicate their needs through discussion and/or role plays.   Summary of Patient Progress: Patient was present for the entirety of the group process. He stated that while here the medication adjustments have been helpful. Pt also shared that he has had to learn patience while here. He said that he is trying to avoid returning to hang out with people who drink or use drugs. Pt identified risk factors as being around people who use or drink. He shared that resiliency factors are working out, staying active, and attending AA/NA support group meetings. Pt appeared to be open and receptive to comments/feedback from both peers and facilitator.    Therapeutic Modalities:   Cognitive Behavioral Therapy Solution-Focused Therapy Assertiveness Training Relapse Prevention Therapy   Glenis Smoker, LCSW

## 2022-03-21 NOTE — BHH Counselor (Signed)
CSW attempted contact with [facility] intake coordinator [phone]. [phone]) for pt screening twice. Unable to establish contact and HIPPA compliant voicemail left with contact information for follow through.   Nursing staff made aware that he was supposed to have screening and that CSW is expecting a call back for pt.   CSW updated pt.  Lee Sparks. [name], MSW, LCSW, LCAS 03/21/2022 10:00 AM

## 2022-03-21 NOTE — Progress Notes (Signed)
[facility] MD Progress Note  03/21/2022 3:14 PM Lee Sparks  MRN:  [id number] Subjective: Continues with some anxiety but behaving well taking care of himself well.  Has interacted appropriately with staff and is working on discharge planning Principal Problem: Major depressive disorder, recurrent severe without psychotic features (HCC) Diagnosis: Principal Problem:   Major depressive disorder, recurrent severe without psychotic features (HCC)  Total Time spent with patient: 30 minutes  Past Psychiatric History: Past history of recurrent anxiety and depression and substance use  Past Medical History:  Past Medical History:  Diagnosis Date   Alcohol-induced pancreatitis    Anemia    Asthma    Bronchitis     Past Surgical History:  Procedure Laterality Date   CYSTECTOMY     ESOPHAGOGASTRODUODENOSCOPY  07/28/2011   Procedure: ESOPHAGOGASTRODUODENOSCOPY (EGD);  Surgeon: Freddy Jaksch, MD;  Location: [facility] ENDOSCOPY;  Service: Endoscopy;  Laterality: N/A;   HERNIA REPAIR     Family History:  Family History  Problem Relation Age of Onset   Hypertension Father    Aneurysm Father    Cancer Other    Hypertension Mother    Pneumonia Mother    Family Psychiatric  History: See previous Social History:  Social History   Substance and Sexual Activity  Alcohol Use Yes   Alcohol/week: 50.0 standard drinks of alcohol   Types: 50 Cans of beer per week   Comment: Equivalent of two 40 oz beers daily.     Social History   Substance and Sexual Activity  Drug Use No    Social History   Socioeconomic History   Marital status: Single    Spouse name: Not on file   Number of children: Not on file   Years of education: Not on file   Highest education level: Not on file  Occupational History   Not on file  Tobacco Use   Smoking status: Every Day    Packs/day: 0.50    Years: 15.00    Total pack years: 7.50    Types: Cigarettes    Last attempt to quit: 06/27/2011    Years since  quitting: 10.7   Smokeless tobacco: Never  Substance and Sexual Activity   Alcohol use: Yes    Alcohol/week: 50.0 standard drinks of alcohol    Types: 50 Cans of beer per week    Comment: Equivalent of two 40 oz beers daily.   Drug use: No   Sexual activity: Not Currently  Other Topics Concern   Not on file  Social History Narrative   Not on file   Social Determinants of Health   Financial Resource Strain: Not on file  Food Insecurity: Not on file  Transportation Needs: Not on file  Physical Activity: Not on file  Stress: Not on file  Social Connections: Not on file   Additional Social History:                         Sleep: Fair  Appetite:  Fair  Current Medications: Current Facility-Administered Medications  Medication Dose Route Frequency Provider Last Rate Last Admin   acetaminophen (TYLENOL) tablet 650 mg  650 mg Oral Q6H PRN Charm Rings, NP       alum & mag hydroxide-simeth (MAALOX/MYLANTA) 200-200-20 MG/5ML suspension 30 mL  30 mL Oral Q4H PRN Charm Rings, NP       amLODipine (NORVASC) tablet 5 mg  5 mg Oral Daily Sarina Ill, DO  5 mg at 03/21/22 4627   citalopram (CELEXA) tablet 20 mg  20 mg Oral Daily Sarina Ill, DO   20 mg at 03/21/22 0350   folic acid (FOLVITE) tablet 1 mg  1 mg Oral Daily Sarina Ill, DO   1 mg at 03/21/22 0938   hydrOXYzine (ATARAX) tablet 50 mg  50 mg Oral Q6H PRN Kharter Sestak T, MD   50 mg at 03/21/22 1453   magnesium hydroxide (MILK OF MAGNESIA) suspension 30 mL  30 mL Oral Daily PRN Charm Rings, NP       nicotine polacrilex (NICORETTE) gum 2 mg  2 mg Oral PRN Sarina Ill, DO   2 mg at 03/21/22 1403   QUEtiapine (SEROQUEL) tablet 200 mg  200 mg Oral QHS Nikko Quast T, MD   200 mg at 03/20/22 2128   thiamine (VITAMIN B1) tablet 100 mg  100 mg Oral Daily Charm Rings, NP   100 mg at 03/21/22 1829   traZODone (DESYREL) tablet 100 mg  100 mg Oral QHS PRN Charm Rings, NP   100 mg at 03/20/22 2129    Lab Results: No results found for this or any previous visit (from the past 48 hour(s)).  Blood Alcohol level:  Lab Results  Component Value Date   ETH 91 (H) 03/13/2022   ETH 108 (H) 10/29/2021    Metabolic Disorder Labs: Lab Results  Component Value Date   HGBA1C 5.7 (H) 05/30/2020   No results found for: "PROLACTIN" Lab Results  Component Value Date   CHOL 206 (H) 05/30/2020   TRIG 187 (H) 05/30/2020   HDL 28 (L) 05/30/2020   CHOLHDL 7.4 (H) 05/30/2020   LDLCALC 144 (H) 05/30/2020   LDLCALC 98 06/28/2013    Physical Findings: AIMS: Facial and Oral Movements Muscles of Facial Expression: None, normal Lips and Perioral Area: None, normal Jaw: None, normal Tongue: None, normal,Extremity Movements Upper (arms, wrists, hands, fingers): None, normal Lower (legs, knees, ankles, toes): None, normal, Trunk Movements Neck, shoulders, hips: None, normal, Overall Severity Severity of abnormal movements (highest score from questions above): None, normal Incapacitation due to abnormal movements: None, normal Patient's awareness of abnormal movements (rate only patient's report): No Awareness, Dental Status Current problems with teeth and/or dentures?: No Does patient usually wear dentures?: No  CIWA:  CIWA-Ar Total: 2 COWS:     Musculoskeletal: Strength & Muscle Tone: within normal limits Gait & Station: normal Patient leans: N/A  Psychiatric Specialty Exam:  Presentation  General Appearance: Appropriate for Environment  Eye Contact:Good  Speech:Clear and Coherent  Speech Volume:Normal  Handedness:Right   Mood and Affect  Mood:Depressed  Affect:Congruent   Thought Process  Thought Processes:Coherent  Descriptions of Associations:Intact  Orientation:Full (Time, Place and Person)  Thought Content:Perseveration  History of Schizophrenia/Schizoaffective disorder:No  Duration of Psychotic Symptoms:No data  recorded Hallucinations:No data recorded Ideas of Reference:None  Suicidal Thoughts:No data recorded Homicidal Thoughts:No data recorded  Sensorium  Memory:Immediate Good  Judgment:Poor  Insight:Poor   Executive Functions  Concentration:Good  Attention Span:Good  Recall:Good  Fund of Knowledge:Fair  Language:Fair   Psychomotor Activity  Psychomotor Activity:No data recorded  Assets  Assets:Resilience; Physical Health; Social Support   Sleep  Sleep:No data recorded   Physical Exam: Physical Exam Vitals and nursing note reviewed.  Constitutional:      Appearance: Normal appearance.  HENT:     Head: Normocephalic and atraumatic.     Mouth/Throat:     Pharynx: Oropharynx is  clear.  Eyes:     Pupils: Pupils are equal, round, and reactive to light.  Cardiovascular:     Rate and Rhythm: Normal rate and regular rhythm.  Pulmonary:     Effort: Pulmonary effort is normal.     Breath sounds: Normal breath sounds.  Abdominal:     General: Abdomen is flat.     Palpations: Abdomen is soft.  Musculoskeletal:        General: Normal range of motion.  Skin:    General: Skin is warm and dry.  Neurological:     General: No focal deficit present.     Mental Status: He is alert. Mental status is at baseline.  Psychiatric:        Attention and Perception: Attention normal.        Mood and Affect: Mood normal.        Speech: Speech normal.        Behavior: Behavior normal.        Thought Content: Thought content normal.        Cognition and Memory: Cognition normal.    Review of Systems  Constitutional: Negative.   HENT: Negative.    Eyes: Negative.   Respiratory: Negative.    Cardiovascular: Negative.   Gastrointestinal: Negative.   Musculoskeletal: Negative.   Skin: Negative.   Neurological: Negative.   Psychiatric/Behavioral: Negative.     Blood pressure 127/85, pulse 72, temperature 98 F (36.7 C), temperature source Oral, resp. rate 18, height 5'  8.5" (1.74 m), weight 88.9 kg, SpO2 100 %. Body mass index is 29.36 kg/m.   Treatment Plan Summary: Plan no change to medication.  Hoping that he will be accepted to rehab and we can look for discharge this upcoming week  Mordecai Rasmussen, MD 03/21/2022, 3:14 PM

## 2022-03-21 NOTE — Plan of Care (Signed)
D- Patient alert and oriented. Patient presented in a pleasant mood on assessment stating that he slept "a little better" last night and had some complaints of sinus issues. Patient stated that his depression is "not as bad today, as yesterday"; and his anxiety is through the roof because he had a phone interview scheduled for 9 am, in which he did request PRN anxiety medication from this Clinical research associate. Patient denied SI, HI, AVH, and pain at the time of assessment, reporting "not today", and that he sees "flickers every once in a while". Patient's goal for today is "getting to a rehab", in which his "phone interview at 9:00", will help him achieve his goal.  A- Scheduled medications administered to patient, per MD orders. Support and encouragement provided.  Routine safety checks conducted every 15 minutes.  Patient informed to notify staff with problems or concerns.  R- No adverse drug reactions noted. Patient contracts for safety at this time. Patient compliant with medications and treatment plan. Patient receptive, calm, and cooperative. Patient interacts well with others on the unit.  Patient remains safe at this time.  Problem: Education: Goal: Knowledge of General Education information will improve Description: Including pain rating scale, medication(s)/side effects and non-pharmacologic comfort measures Outcome: Progressing   Problem: Health Behavior/Discharge Planning: Goal: Ability to manage health-related needs will improve Outcome: Progressing   Problem: Clinical Measurements: Goal: Ability to maintain clinical measurements within normal limits will improve Outcome: Progressing Goal: Will remain free from infection Outcome: Progressing Goal: Diagnostic test results will improve Outcome: Progressing Goal: Respiratory complications will improve Outcome: Progressing Goal: Cardiovascular complication will be avoided Outcome: Progressing   Problem: Activity: Goal: Risk for activity  intolerance will decrease Outcome: Progressing   Problem: Nutrition: Goal: Adequate nutrition will be maintained Outcome: Progressing   Problem: Coping: Goal: Level of anxiety will decrease Outcome: Progressing   Problem: Elimination: Goal: Will not experience complications related to bowel motility Outcome: Progressing Goal: Will not experience complications related to urinary retention Outcome: Progressing   Problem: Pain Managment: Goal: General experience of comfort will improve Outcome: Progressing   Problem: Safety: Goal: Ability to remain free from injury will improve Outcome: Progressing   Problem: Skin Integrity: Goal: Risk for impaired skin integrity will decrease Outcome: Progressing   Problem: Education: Goal: Knowledge of [place] General Education information/materials will improve Outcome: Progressing Goal: Emotional status will improve Outcome: Progressing Goal: Mental status will improve Outcome: Progressing Goal: Verbalization of understanding the information provided will improve Outcome: Progressing   Problem: Activity: Goal: Interest or engagement in activities will improve Outcome: Progressing Goal: Sleeping patterns will improve Outcome: Progressing   Problem: Coping: Goal: Ability to verbalize frustrations and anger appropriately will improve Outcome: Progressing Goal: Ability to demonstrate self-control will improve Outcome: Progressing   Problem: Health Behavior/Discharge Planning: Goal: Identification of resources available to assist in meeting health care needs will improve Outcome: Progressing Goal: Compliance with treatment plan for underlying cause of condition will improve Outcome: Progressing   Problem: Physical Regulation: Goal: Ability to maintain clinical measurements within normal limits will improve Outcome: Progressing   Problem: Safety: Goal: Periods of time without injury will increase Outcome: Progressing    Problem: Education: Goal: Ability to make informed decisions regarding treatment will improve Outcome: Progressing   Problem: Medication: Goal: Compliance with prescribed medication regimen will improve Outcome: Progressing   Problem: Education: Goal: Utilization of techniques to improve thought processes will improve Outcome: Progressing Goal: Knowledge of the prescribed therapeutic regimen will improve Outcome: Progressing  Problem: Activity: Goal: Interest or engagement in leisure activities will improve Outcome: Progressing Goal: Imbalance in normal sleep/wake cycle will improve Outcome: Progressing   Problem: Coping: Goal: Coping ability will improve Outcome: Progressing Goal: Will verbalize feelings Outcome: Progressing   Problem: Health Behavior/Discharge Planning: Goal: Ability to make decisions will improve Outcome: Progressing Goal: Compliance with therapeutic regimen will improve Outcome: Progressing   Problem: Role Relationship: Goal: Will demonstrate positive changes in social behaviors and relationships Outcome: Progressing   Problem: Safety: Goal: Ability to disclose and discuss suicidal ideas will improve Outcome: Progressing Goal: Ability to identify and utilize support systems that promote safety will improve Outcome: Progressing   Problem: Self-Concept: Goal: Will verbalize positive feelings about self Outcome: Progressing Goal: Level of anxiety will decrease Outcome: Progressing

## 2022-03-21 NOTE — BHH Counselor (Signed)
CSW attempted to contact [employer] [phone]) regarding phone screening. CSW was unable to establish contact but was able to leave a HIPPA compliant voicemail with contact information for follow through.   CSW will continue to follow.   Vilma Meckel. [name], MSW, LCSW, LCAS 03/21/2022 1:56 PM

## 2022-03-21 NOTE — Progress Notes (Signed)
Recreation Therapy Notes  Date: 03/21/2022   Time: 10:50 am     Location: Courtyard  Behavioral response: Appropriate  Intervention Topic:  Social skills   Discussion/Intervention:  Group content on today was focused on social skills. The group defined social skills and identified ways they use social skills. Patients expressed what obstacles they face when trying to be social. Participants described the importance of social skills. The group listed ways to improve social skills and reasons to improve social skills. Individuals had an opportunity to learn new and improve social skills as well as identify their weaknesses. Clinical Observations/Feedback: Patient came to group and was focused on what peers and staff had to say about social skills.Individual was social with peers and staff while participating in the intervention.  Madyson Lukach LRT/CTRS        Malala Trenkamp 03/21/2022 1:10 PM

## 2022-03-22 DIAGNOSIS — F332 Major depressive disorder, recurrent severe without psychotic features: Secondary | ICD-10-CM | POA: Diagnosis not present

## 2022-03-22 NOTE — BHH Group Notes (Signed)
PsychoEducational Group Note- Patients attended am group. Patients were educated on positive reframing versus negative thinking and the impact it can have on the mood. Patients were given a poem by the [place] and asked to read it, and then share one negative habit/thought they would like to change. Pt attended and was appropriate. Pt shared he would like to let go of drinking, as he is aware of the negative impacts that it has had in his life.

## 2022-03-22 NOTE — Progress Notes (Signed)
[facility] MD Progress Note  03/22/2022 12:48 PM Lee Sparks  MRN:  [id number] Subjective: No change to behavior.  Mood reported as being a little better.  Slept better last night.  No word yet on rehab placement Principal Problem: Major depressive disorder, recurrent severe without psychotic features (HCC) Diagnosis: Principal Problem:   Major depressive disorder, recurrent severe without psychotic features (HCC)  Total Time spent with patient: 20 minutes  Past Psychiatric History: Past history of substance abuse and anxiety  Past Medical History:  Past Medical History:  Diagnosis Date   Alcohol-induced pancreatitis    Anemia    Asthma    Bronchitis     Past Surgical History:  Procedure Laterality Date   CYSTECTOMY     ESOPHAGOGASTRODUODENOSCOPY  07/28/2011   Procedure: ESOPHAGOGASTRODUODENOSCOPY (EGD);  Surgeon: Freddy Jaksch, MD;  Location: [facility] ENDOSCOPY;  Service: Endoscopy;  Laterality: N/A;   HERNIA REPAIR     Family History:  Family History  Problem Relation Age of Onset   Hypertension Father    Aneurysm Father    Cancer Other    Hypertension Mother    Pneumonia Mother    Family Psychiatric  History: See previous Social History:  Social History   Substance and Sexual Activity  Alcohol Use Yes   Alcohol/week: 50.0 standard drinks of alcohol   Types: 50 Cans of beer per week   Comment: Equivalent of two 40 oz beers daily.     Social History   Substance and Sexual Activity  Drug Use No    Social History   Socioeconomic History   Marital status: Single    Spouse name: Not on file   Number of children: Not on file   Years of education: Not on file   Highest education level: Not on file  Occupational History   Not on file  Tobacco Use   Smoking status: Every Day    Packs/day: 0.50    Years: 15.00    Total pack years: 7.50    Types: Cigarettes    Last attempt to quit: 06/27/2011    Years since quitting: 10.7   Smokeless tobacco: Never  Substance  and Sexual Activity   Alcohol use: Yes    Alcohol/week: 50.0 standard drinks of alcohol    Types: 50 Cans of beer per week    Comment: Equivalent of two 40 oz beers daily.   Drug use: No   Sexual activity: Not Currently  Other Topics Concern   Not on file  Social History Narrative   Not on file   Social Determinants of Health   Financial Resource Strain: Not on file  Food Insecurity: Not on file  Transportation Needs: Not on file  Physical Activity: Not on file  Stress: Not on file  Social Connections: Not on file   Additional Social History:                         Sleep: Fair  Appetite:  Fair  Current Medications: Current Facility-Administered Medications  Medication Dose Route Frequency Provider Last Rate Last Admin   acetaminophen (TYLENOL) tablet 650 mg  650 mg Oral Q6H PRN Charm Rings, NP       alum & mag hydroxide-simeth (MAALOX/MYLANTA) 200-200-20 MG/5ML suspension 30 mL  30 mL Oral Q4H PRN Charm Rings, NP       amLODipine (NORVASC) tablet 5 mg  5 mg Oral Daily Sarina Ill, DO   5 mg at  03/22/22 0812   citalopram (CELEXA) tablet 20 mg  20 mg Oral Daily Sarina Ill, DO   20 mg at 03/22/22 4098   folic acid (FOLVITE) tablet 1 mg  1 mg Oral Daily Sarina Ill, DO   1 mg at 03/22/22 1191   hydrOXYzine (ATARAX) tablet 50 mg  50 mg Oral Q6H PRN Alzena Gerber, [name], MD   50 mg at 03/22/22 1235   loratadine (CLARITIN) tablet 10 mg  10 mg Oral Daily Kalynne Womac, [name], MD   10 mg at 03/22/22 4782   magnesium hydroxide (MILK OF MAGNESIA) suspension 30 mL  30 mL Oral Daily PRN Charm Rings, NP       nicotine polacrilex (NICORETTE) gum 2 mg  2 mg Oral PRN Sarina Ill, DO   2 mg at 03/22/22 0813   QUEtiapine (SEROQUEL) tablet 200 mg  200 mg Oral QHS Samier Jaco T, MD   200 mg at 03/21/22 2120   thiamine (VITAMIN B1) tablet 100 mg  100 mg Oral Daily Charm Rings, NP   100 mg at 03/22/22 9562   traZODone (DESYREL)  tablet 100 mg  100 mg Oral QHS PRN Charm Rings, NP   100 mg at 03/20/22 2129    Lab Results: No results found for this or any previous visit (from the past 48 hour(s)).  Blood Alcohol level:  Lab Results  Component Value Date   ETH 91 (H) 03/13/2022   ETH 108 (H) 10/29/2021    Metabolic Disorder Labs: Lab Results  Component Value Date   HGBA1C 5.7 (H) 05/30/2020   No results found for: "PROLACTIN" Lab Results  Component Value Date   CHOL 206 (H) 05/30/2020   TRIG 187 (H) 05/30/2020   HDL 28 (L) 05/30/2020   CHOLHDL 7.4 (H) 05/30/2020   LDLCALC 144 (H) 05/30/2020   LDLCALC 98 06/28/2013    Physical Findings: AIMS: Facial and Oral Movements Muscles of Facial Expression: None, normal Lips and Perioral Area: None, normal Jaw: None, normal Tongue: None, normal,Extremity Movements Upper (arms, wrists, hands, fingers): None, normal Lower (legs, knees, ankles, toes): None, normal, Trunk Movements Neck, shoulders, hips: None, normal, Overall Severity Severity of abnormal movements (highest score from questions above): None, normal Incapacitation due to abnormal movements: None, normal Patient's awareness of abnormal movements (rate only patient's report): No Awareness, Dental Status Current problems with teeth and/or dentures?: No Does patient usually wear dentures?: No  CIWA:  CIWA-Ar Total: 2 COWS:     Musculoskeletal: Strength & Muscle Tone: within normal limits Gait & Station: normal Patient leans: N/A  Psychiatric Specialty Exam:  Presentation  General Appearance: Appropriate for Environment  Eye Contact:Good  Speech:Clear and Coherent  Speech Volume:Normal  Handedness:Right   Mood and Affect  Mood:Depressed  Affect:Congruent   Thought Process  Thought Processes:Coherent  Descriptions of Associations:Intact  Orientation:Full (Time, Place and Person)  Thought Content:Perseveration  History of Schizophrenia/Schizoaffective  disorder:No  Duration of Psychotic Symptoms:No data recorded Hallucinations:No data recorded Ideas of Reference:None  Suicidal Thoughts:No data recorded Homicidal Thoughts:No data recorded  Sensorium  Memory:Immediate Good  Judgment:Poor  Insight:Poor   Executive Functions  Concentration:Good  Attention Span:Good  Recall:Good  Fund of Knowledge:Fair  Language:Fair   Psychomotor Activity  Psychomotor Activity:No data recorded  Assets  Assets:Resilience; Physical Health; Social Support   Sleep  Sleep:No data recorded   Physical Exam: Physical Exam Vitals and nursing note reviewed.  Constitutional:      Appearance: Normal appearance.  HENT:  Head: Normocephalic and atraumatic.     Mouth/Throat:     Pharynx: Oropharynx is clear.  Eyes:     Pupils: Pupils are equal, round, and reactive to light.  Cardiovascular:     Rate and Rhythm: Normal rate and regular rhythm.  Pulmonary:     Effort: Pulmonary effort is normal.     Breath sounds: Normal breath sounds.  Abdominal:     General: Abdomen is flat.     Palpations: Abdomen is soft.  Musculoskeletal:        General: Normal range of motion.  Skin:    General: Skin is warm and dry.  Neurological:     General: No focal deficit present.     Mental Status: He is alert. Mental status is at baseline.  Psychiatric:        Attention and Perception: Attention normal.        Mood and Affect: Mood normal.        Speech: Speech normal.        Behavior: Behavior is cooperative.        Thought Content: Thought content normal.    Review of Systems  Constitutional: Negative.   HENT: Negative.    Eyes: Negative.   Respiratory: Negative.    Cardiovascular: Negative.   Gastrointestinal: Negative.   Musculoskeletal: Negative.   Skin: Negative.   Neurological: Negative.   Psychiatric/Behavioral:  The patient is nervous/anxious.    Blood pressure (!) 117/95, pulse 66, temperature 97.6 F (36.4 C), temperature  source Oral, resp. rate 16, height 5' 8.5" (1.74 m), weight 88.9 kg, SpO2 100 %. Body mass index is 29.36 kg/m.   Treatment Plan Summary: Plan no change to medication.  Continue current dose of Seroquel.  Supportive therapy and encouragement while we continue to follow up on placement options.  Mordecai Rasmussen, MD 03/22/2022, 12:48 PM

## 2022-03-22 NOTE — B[facility] Group Notes (Signed)
PsychoEducational Group Note- Patients were read a poem by Portia Nelson Titled '' There's a hole in my sidewalk ''  Patient's were then asked to reflect and identify ways in which they noticed '' before they fell in the hole '' as it relates to hospitalization. Pt participated and was appropriate.  

## 2022-03-22 NOTE — Plan of Care (Signed)
D- Patient alert and oriented. Patient presented in a pleasant mood on assessment reporting that he slept good last night, "better than I have in the past two-three days". Patient endorsed both depression and anxiety on his self-inventory, however, he did not go into detail as to why he is feeling this way. Patient also did not request any PRN medication at the time of assessment. Patient denied SI, HI, AVH, and pain at this time, "not so far". Patient's goal for today is "getting ina rehab", in which he will "call someone", in order to help achieve his goal.  A- Scheduled medications administered to patient, per MD orders. Support and encouragement provided.  Routine safety checks conducted every 15 minutes.  Patient informed to notify staff with problems or concerns.  R- No adverse drug reactions noted. Patient contracts for safety at this time. Patient compliant with medications and treatment plan. Patient receptive, calm, and cooperative. Patient interacts well with others on the unit.  Patient remains safe at this time.  Problem: Education: Goal: Knowledge of General Education information will improve Description: Including pain rating scale, medication(s)/side effects and non-pharmacologic comfort measures Outcome: Progressing   Problem: Health Behavior/Discharge Planning: Goal: Ability to manage health-related needs will improve Outcome: Progressing   Problem: Clinical Measurements: Goal: Ability to maintain clinical measurements within normal limits will improve Outcome: Progressing Goal: Will remain free from infection Outcome: Progressing Goal: Diagnostic test results will improve Outcome: Progressing Goal: Respiratory complications will improve Outcome: Progressing Goal: Cardiovascular complication will be avoided Outcome: Progressing   Problem: Activity: Goal: Risk for activity intolerance will decrease Outcome: Progressing   Problem: Nutrition: Goal: Adequate nutrition  will be maintained Outcome: Progressing   Problem: Coping: Goal: Level of anxiety will decrease Outcome: Progressing   Problem: Elimination: Goal: Will not experience complications related to bowel motility Outcome: Progressing Goal: Will not experience complications related to urinary retention Outcome: Progressing   Problem: Pain Managment: Goal: General experience of comfort will improve Outcome: Progressing   Problem: Safety: Goal: Ability to remain free from injury will improve Outcome: Progressing   Problem: Skin Integrity: Goal: Risk for impaired skin integrity will decrease Outcome: Progressing   Problem: Education: Goal: Knowledge of [place] [facility] Education information/materials will improve Outcome: Progressing Goal: Emotional status will improve Outcome: Progressing Goal: Mental status will improve Outcome: Progressing Goal: Verbalization of understanding the information provided will improve Outcome: Progressing   Problem: Activity: Goal: Interest or engagement in activities will improve Outcome: Progressing Goal: Sleeping patterns will improve Outcome: Progressing   Problem: Coping: Goal: Ability to verbalize frustrations and anger appropriately will improve Outcome: Progressing Goal: Ability to demonstrate self-control will improve Outcome: Progressing   Problem: Health Behavior/Discharge Planning: Goal: Identification of resources available to assist in meeting health care needs will improve Outcome: Progressing Goal: Compliance with treatment plan for underlying cause of condition will improve Outcome: Progressing   Problem: Physical Regulation: Goal: Ability to maintain clinical measurements within normal limits will improve Outcome: Progressing   Problem: Safety: Goal: Periods of time without injury will increase Outcome: Progressing   Problem: Education: Goal: Ability to make informed decisions regarding treatment will  improve Outcome: Progressing   Problem: Medication: Goal: Compliance with prescribed medication regimen will improve Outcome: Progressing   Problem: Education: Goal: Utilization of techniques to improve thought processes will improve Outcome: Progressing Goal: Knowledge of the prescribed therapeutic regimen will improve Outcome: Progressing   Problem: Activity: Goal: Interest or engagement in leisure activities will improve Outcome: Progressing Goal: Imbalance  in normal sleep/wake cycle will improve Outcome: Progressing   Problem: Coping: Goal: Coping ability will improve Outcome: Progressing Goal: Will verbalize feelings Outcome: Progressing   Problem: Health Behavior/Discharge Planning: Goal: Ability to make decisions will improve Outcome: Progressing Goal: Compliance with therapeutic regimen will improve Outcome: Progressing   Problem: Role Relationship: Goal: Will demonstrate positive changes in social behaviors and relationships Outcome: Progressing   Problem: Safety: Goal: Ability to disclose and discuss suicidal ideas will improve Outcome: Progressing Goal: Ability to identify and utilize support systems that promote safety will improve Outcome: Progressing   Problem: Self-Concept: Goal: Will verbalize positive feelings about self Outcome: Progressing Goal: Level of anxiety will decrease Outcome: Progressing

## 2022-03-23 DIAGNOSIS — F332 Major depressive disorder, recurrent severe without psychotic features: Secondary | ICD-10-CM | POA: Diagnosis not present

## 2022-03-23 MED ORDER — CITALOPRAM HYDROBROMIDE 20 MG PO TABS
40.0000 mg | ORAL_TABLET | Freq: Every day | ORAL | Status: DC
  Administered 2022-03-24 – 2022-04-02 (×10): 40 mg via ORAL
  Filled 2022-03-23 (×10): qty 2

## 2022-03-23 NOTE — Progress Notes (Signed)
[facility] MD Progress Note  03/23/2022 12:26 PM DIAMONTAE AMIGON  MRN:  [id number] Subjective: Follow-up 51 year old man with depression and substance abuse.  Still stays isolated most of the time.  Mood depressed.  No active suicidal thoughts.  Feels tired a lot. Principal Problem: Major depressive disorder, recurrent severe without psychotic features ([place]) Diagnosis: Principal Problem:   Major depressive disorder, recurrent severe without psychotic features ([place])  Total Time spent with patient: 20 minutes  Past Psychiatric History: Past history of recurrent depression and anxiety  Past Medical History:  Past Medical History:  Diagnosis Date   Alcohol-induced pancreatitis    Anemia    Asthma    Bronchitis     Past Surgical History:  Procedure Laterality Date   CYSTECTOMY     ESOPHAGOGASTRODUODENOSCOPY  07/28/2011   Procedure: ESOPHAGOGASTRODUODENOSCOPY (EGD);  Surgeon: Landry Dyke, MD;  Location: [facility] ENDOSCOPY;  Service: Endoscopy;  Laterality: N/A;   HERNIA REPAIR     Family History:  Family History  Problem Relation Age of Onset   Hypertension Father    Aneurysm Father    Cancer Other    Hypertension Mother    Pneumonia Mother    Family Psychiatric  History: See previous Social History:  Social History   Substance and Sexual Activity  Alcohol Use Yes   Alcohol/week: 50.0 standard drinks of alcohol   Types: 50 Cans of beer per week   Comment: Equivalent of two 40 oz beers daily.     Social History   Substance and Sexual Activity  Drug Use No    Social History   Socioeconomic History   Marital status: Single    Spouse name: Not on file   Number of children: Not on file   Years of education: Not on file   Highest education level: Not on file  Occupational History   Not on file  Tobacco Use   Smoking status: Every Day    Packs/day: 0.50    Years: 15.00    Total pack years: 7.50    Types: Cigarettes    Last attempt to quit: 06/27/2011    Years since  quitting: 10.7   Smokeless tobacco: Never  Substance and Sexual Activity   Alcohol use: Yes    Alcohol/week: 50.0 standard drinks of alcohol    Types: 50 Cans of beer per week    Comment: Equivalent of two 40 oz beers daily.   Drug use: No   Sexual activity: Not Currently  Other Topics Concern   Not on file  Social History Narrative   Not on file   Social Determinants of Health   Financial Resource Strain: Not on file  Food Insecurity: Not on file  Transportation Needs: Not on file  Physical Activity: Not on file  Stress: Not on file  Social Connections: Not on file   Additional Social History:                         Sleep: Fair  Appetite:  Fair  Current Medications: Current Facility-Administered Medications  Medication Dose Route Frequency Provider Last Rate Last Admin   acetaminophen (TYLENOL) tablet 650 mg  650 mg Oral Q6H PRN Patrecia Pour, NP       alum & mag hydroxide-simeth (MAALOX/MYLANTA) 200-200-20 MG/5ML suspension 30 mL  30 mL Oral Q4H PRN Patrecia Pour, NP       amLODipine (NORVASC) tablet 5 mg  5 mg Oral Daily Parks Ranger, DO  5 mg at 03/23/22 0810   citalopram (CELEXA) tablet 20 mg  20 mg Oral Daily Sarina Ill, DO   20 mg at 03/23/22 4259   folic acid (FOLVITE) tablet 1 mg  1 mg Oral Daily Sarina Ill, DO   1 mg at 03/23/22 5638   hydrOXYzine (ATARAX) tablet 50 mg  50 mg Oral Q6H PRN Chelli Yerkes, [name], MD   50 mg at 03/22/22 2231   loratadine (CLARITIN) tablet 10 mg  10 mg Oral Daily Nevaeh Korte T, MD   10 mg at 03/23/22 0810   magnesium hydroxide (MILK OF MAGNESIA) suspension 30 mL  30 mL Oral Daily PRN Charm Rings, NP       nicotine polacrilex (NICORETTE) gum 2 mg  2 mg Oral PRN Sarina Ill, DO   2 mg at 03/22/22 0813   QUEtiapine (SEROQUEL) tablet 200 mg  200 mg Oral QHS Azaiah Mello T, MD   200 mg at 03/22/22 2231   thiamine (VITAMIN B1) tablet 100 mg  100 mg Oral Daily Charm Rings,  NP   100 mg at 03/23/22 0810   traZODone (DESYREL) tablet 100 mg  100 mg Oral QHS PRN Charm Rings, NP   100 mg at 03/20/22 2129    Lab Results: No results found for this or any previous visit (from the past 48 hour(s)).  Blood Alcohol level:  Lab Results  Component Value Date   ETH 91 (H) 03/13/2022   ETH 108 (H) 10/29/2021    Metabolic Disorder Labs: Lab Results  Component Value Date   HGBA1C 5.7 (H) 05/30/2020   No results found for: "PROLACTIN" Lab Results  Component Value Date   CHOL 206 (H) 05/30/2020   TRIG 187 (H) 05/30/2020   HDL 28 (L) 05/30/2020   CHOLHDL 7.4 (H) 05/30/2020   LDLCALC 144 (H) 05/30/2020   LDLCALC 98 06/28/2013    Physical Findings: AIMS: Facial and Oral Movements Muscles of Facial Expression: None, normal Lips and Perioral Area: None, normal Jaw: None, normal Tongue: None, normal,Extremity Movements Upper (arms, wrists, hands, fingers): None, normal Lower (legs, knees, ankles, toes): None, normal, Trunk Movements Neck, shoulders, hips: None, normal, Overall Severity Severity of abnormal movements (highest score from questions above): None, normal Incapacitation due to abnormal movements: None, normal Patient's awareness of abnormal movements (rate only patient's report): No Awareness, Dental Status Current problems with teeth and/or dentures?: No Does patient usually wear dentures?: No  CIWA:  CIWA-Ar Total: 2 COWS:     Musculoskeletal: Strength & Muscle Tone: within normal limits Gait & Station: normal Patient leans: N/A  Psychiatric Specialty Exam:  Presentation  General Appearance: Appropriate for Environment  Eye Contact:Good  Speech:Clear and Coherent  Speech Volume:Normal  Handedness:Right   Mood and Affect  Mood:Depressed  Affect:Congruent   Thought Process  Thought Processes:Coherent  Descriptions of Associations:Intact  Orientation:Full (Time, Place and Person)  Thought  Content:Perseveration  History of Schizophrenia/Schizoaffective disorder:No  Duration of Psychotic Symptoms:No data recorded Hallucinations:No data recorded Ideas of Reference:None  Suicidal Thoughts:No data recorded Homicidal Thoughts:No data recorded  Sensorium  Memory:Immediate Good  Judgment:Poor  Insight:Poor   Executive Functions  Concentration:Good  Attention Span:Good  Recall:Good  Fund of Knowledge:Fair  Language:Fair   Psychomotor Activity  Psychomotor Activity:No data recorded  Assets  Assets:Resilience; Physical Health; Social Support   Sleep  Sleep:No data recorded   Physical Exam: Physical Exam Vitals and nursing note reviewed.  Constitutional:      Appearance: Normal  appearance.  HENT:     Head: Normocephalic and atraumatic.     Mouth/Throat:     Pharynx: Oropharynx is clear.  Eyes:     Pupils: Pupils are equal, round, and reactive to light.  Cardiovascular:     Rate and Rhythm: Normal rate and regular rhythm.  Pulmonary:     Effort: Pulmonary effort is normal.     Breath sounds: Normal breath sounds.  Abdominal:     General: Abdomen is flat.     Palpations: Abdomen is soft.  Musculoskeletal:        General: Normal range of motion.  Skin:    General: Skin is warm and dry.  Neurological:     General: No focal deficit present.     Mental Status: He is alert. Mental status is at baseline.  Psychiatric:        Mood and Affect: Mood normal.        Thought Content: Thought content normal.    Review of Systems  Constitutional: Negative.   HENT: Negative.    Eyes: Negative.   Respiratory: Negative.    Cardiovascular: Negative.   Gastrointestinal: Negative.   Musculoskeletal: Negative.   Skin: Negative.   Neurological: Negative.   Psychiatric/Behavioral: Negative.     Blood pressure 129/82, pulse 65, temperature 97.8 F (36.6 C), temperature source Oral, resp. rate 16, height 5' 8.5" (1.74 m), weight 88.9 kg, SpO2 99 %. Body  mass index is 29.36 kg/m.   Treatment Plan Summary: Plan Will increase Celexa dose to 40 mg a day.  Continue other medications.  Encourage group attendance and getting out of bed and being more active.  We are still looking for possible rehab options  Mordecai Rasmussen, MD 03/23/2022, 12:26 PM

## 2022-03-23 NOTE — Tx Team (Signed)
Interdisciplinary Treatment and Diagnostic Plan Update  03/23/2022 Time of Session: 0930 ROBEY DEBOY MRN: [id number]  Principal Diagnosis: Major depressive disorder, recurrent severe without psychotic features ([place])  Secondary Diagnoses: Principal Problem:   Major depressive disorder, recurrent severe without psychotic features ([place])   Current Medications:  Current Facility-Administered Medications  Medication Dose Route Frequency Provider Last Rate Last Admin   acetaminophen (TYLENOL) tablet 650 mg  650 mg Oral Q6H PRN Patrecia Pour, NP       alum & mag hydroxide-simeth (MAALOX/MYLANTA) 200-200-20 MG/5ML suspension 30 mL  30 mL Oral Q4H PRN Patrecia Pour, NP       amLODipine (NORVASC) tablet 5 mg  5 mg Oral Daily Parks Ranger, DO   5 mg at 03/23/22 0810   citalopram (CELEXA) tablet 20 mg  20 mg Oral Daily Parks Ranger, DO   20 mg at [id number]   folic acid (FOLVITE) tablet 1 mg  1 mg Oral Daily Parks Ranger, DO   1 mg at 03/23/22 0810   hydrOXYzine (ATARAX) tablet 50 mg  50 mg Oral Q6H PRN [name], [name], MD   50 mg at 03/22/22 2231   loratadine (CLARITIN) tablet 10 mg  10 mg Oral Daily [name], [name], MD   10 mg at 03/23/22 0810   magnesium hydroxide (MILK OF MAGNESIA) suspension 30 mL  30 mL Oral Daily PRN Patrecia Pour, NP       nicotine polacrilex (NICORETTE) gum 2 mg  2 mg Oral PRN Parks Ranger, DO   2 mg at 03/22/22 0813   QUEtiapine (SEROQUEL) tablet 200 mg  200 mg Oral QHS [name], [name], MD   200 mg at 03/22/22 2231   thiamine (VITAMIN B1) tablet 100 mg  100 mg Oral Daily Patrecia Pour, NP   100 mg at 03/23/22 0810   traZODone (DESYREL) tablet 100 mg  100 mg Oral QHS PRN Patrecia Pour, NP   100 mg at 03/20/22 2129   PTA Medications: Medications Prior to Admission  Medication Sig Dispense Refill Last Dose   amLODipine (NORVASC) 5 MG tablet Take 1 tablet (5 mg total) by mouth daily. (Patient not taking: Reported on  03/13/2022) 30 tablet 1    aspirin 81 MG EC tablet Take 1 tablet (81 mg total) by mouth daily. Swallow whole. (Patient not taking: Reported on 03/13/2022) 30 tablet 1    buPROPion (WELLBUTRIN XL) 150 MG 24 hr tablet Take 1 tablet (150 mg total) by mouth daily. (Patient not taking: Reported on 03/13/2022) 30 tablet 1    FLUoxetine (PROZAC) 20 MG capsule Take 1 capsule (20 mg total) by mouth daily. (Patient not taking: Reported on 03/13/2022) 30 capsule 1    fluticasone (FLONASE) 50 MCG/ACT nasal spray Place 2 sprays into both nostrils daily. (Patient not taking: Reported on 03/13/2022) 16 g 1    hydrOXYzine (ATARAX) 50 MG tablet Take 1 tablet (50 mg total) by mouth every 6 (six) hours as needed for anxiety (sleep). (Patient not taking: Reported on 03/13/2022) 60 tablet 1    ibuprofen (ADVIL) 600 MG tablet Take 1 tablet (600 mg total) by mouth every 6 (six) hours as needed for mild pain, moderate pain or headache. (Patient not taking: Reported on 03/13/2022) 60 tablet 1    loratadine (CLARITIN) 10 MG tablet Take 1 tablet (10 mg total) by mouth daily. (Patient not taking: Reported on 03/13/2022) 30 tablet 1    nicotine polacrilex (NICORETTE) 2  MG gum Take 1 each (2 mg total) by mouth as needed for smoking cessation. (Patient not taking: Reported on 03/13/2022) 110 each 0    pantoprazole (PROTONIX) 20 MG tablet Take 1 tablet (20 mg total) by mouth daily. (Patient not taking: Reported on 03/13/2022) 30 tablet 1    QUEtiapine (SEROQUEL) 100 MG tablet Take 1 tablet (100 mg total) by mouth at bedtime as needed (sleep). (Patient not taking: Reported on 03/13/2022) 30 tablet 1     Patient Stressors: Financial difficulties   Marital or family conflict   Medication change or noncompliance   Occupational concerns   Substance abuse    Patient Strengths: Capable of independent living  Manufacturing systems engineer  Work skills   Treatment Modalities: Medication Management, Group therapy, Case management,  1 to 1 session with  clinician, Psychoeducation, Recreational therapy.   Physician Treatment Plan for Primary Diagnosis: Major depressive disorder, recurrent severe without psychotic features (HCC) Long Term Goal(s): Improvement in symptoms so as ready for discharge   Short Term Goals: Ability to identify changes in lifestyle to reduce recurrence of condition will improve Ability to verbalize feelings will improve Ability to disclose and discuss suicidal ideas Ability to demonstrate self-control will improve Ability to identify and develop effective coping behaviors will improve Ability to maintain clinical measurements within normal limits will improve Compliance with prescribed medications will improve Ability to identify triggers associated with substance abuse/mental health issues will improve  Medication Management: Evaluate patient's response, side effects, and tolerance of medication regimen.  Therapeutic Interventions: 1 to 1 sessions, Unit Group sessions and Medication administration.  Evaluation of Outcomes: Progressing  Physician Treatment Plan for Secondary Diagnosis: Principal Problem:   Major depressive disorder, recurrent severe without psychotic features (HCC)  Long Term Goal(s): Improvement in symptoms so as ready for discharge   Short Term Goals: Ability to identify changes in lifestyle to reduce recurrence of condition will improve Ability to verbalize feelings will improve Ability to disclose and discuss suicidal ideas Ability to demonstrate self-control will improve Ability to identify and develop effective coping behaviors will improve Ability to maintain clinical measurements within normal limits will improve Compliance with prescribed medications will improve Ability to identify triggers associated with substance abuse/mental health issues will improve     Medication Management: Evaluate patient's response, side effects, and tolerance of medication regimen.  Therapeutic  Interventions: 1 to 1 sessions, Unit Group sessions and Medication administration.  Evaluation of Outcomes: Progressing   RN Treatment Plan for Primary Diagnosis: Major depressive disorder, recurrent severe without psychotic features (HCC) Long Term Goal(s): Knowledge of disease and therapeutic regimen to maintain health will improve  Short Term Goals: Ability to verbalize feelings will improve, Ability to identify and develop effective coping behaviors will improve, and Compliance with prescribed medications will improve  Medication Management: RN will administer medications as ordered by provider, will assess and evaluate patient's response and provide education to patient for prescribed medication. RN will report any adverse and/or side effects to prescribing provider.  Therapeutic Interventions: 1 on 1 counseling sessions, Psychoeducation, Medication administration, Evaluate responses to treatment, Monitor vital signs and CBGs as ordered, Perform/monitor CIWA, COWS, AIMS and Fall Risk screenings as ordered, Perform wound care treatments as ordered.  Evaluation of Outcomes: Progressing   LCSW Treatment Plan for Primary Diagnosis: Major depressive disorder, recurrent severe without psychotic features (HCC) Long Term Goal(s): Safe transition to appropriate next level of care at discharge, Engage patient in therapeutic group addressing interpersonal concerns.  Short Term Goals: Engage  patient in aftercare planning with referrals and resources, Facilitate acceptance of mental health diagnosis and concerns, and Increase skills for wellness and recovery  Therapeutic Interventions: Assess for all discharge needs, 1 to 1 time with Social worker, Explore available resources and support systems, Assess for adequacy in community support network, Educate family and significant other(s) on suicide prevention, Complete Psychosocial Assessment, Interpersonal group therapy.  Evaluation of Outcomes:  Progressing   Progress in Treatment: Attending groups: Yes. Participating in groups: Yes. Taking medication as prescribed: Yes. Toleration medication: Yes. Family/Significant other contact made: No, will contact:  pt declined consent Patient understands diagnosis: Yes. Discussing patient identified problems/goals with staff: Yes. Medical problems stabilized or resolved: Yes. Denies suicidal/homicidal ideation: Yes. Issues/concerns per patient self-inventory: No. Other: none  New problem(s) identified: No, Describe:  none  New Short Term/Long Term Goal(s):  Patient Goals:    Discharge Plan or Barriers:   Reason for Continuation of Hospitalization: Depression Medication stabilization  Estimated Length of Stay:1-3 days  Last 3 [place] Suicide Severity Risk Score: Flowsheet Row Admission (Current) from 03/15/2022 in [facility] INPATIENT BE[facility]AL MEDICINE ED from [facility]13/2022 [facility] Admission (Discharged) from 10/29/2021 in [facility] INPATIENT BEHAVIORAL MEDICINE  C-SSRS RISK CATEGORY No Risk High Risk Moderate Risk       Last PHQ 2/9 Scores:    05/10/2020    3:58 PM  Depression screen PHQ 2/9  Decreased Interest 0  Down, Depressed, Hopeless 1  PHQ - 2 Score 1  Altered sleeping 1  Tired, decreased energy 0  Change in appetite 0  Feeling bad or failure about yourself  0  Trouble concentrating 1  Moving slowly or fidgety/restless 0  Suicidal thoughts 0  PHQ-9 Score 3  Difficult doing work/chores Somewhat difficult    Scribe for Treatment Team: Wyn Quaker 03/23/2022 9:34 AM

## 2022-03-23 NOTE — [facility] Group Notes (Signed)
LCSW Wellness Group Note   03/23/2022 1:00pm  Type of Group and Topic: Psychoeducational Group:  Wellness  Participation Level:  active  Description of Group  Wellness group introduces the topic and its focus on developing healthy habits across the spectrum and its relationship to a decrease in hospital admissions.  Six areas of wellness are discussed: physical, social spiritual, intellectual, occupational, and emotional.  Patients are asked to consider their current wellness habits and to identify areas of wellness where they are interested and able to focus on improvements.    Therapeutic Goals Patients will understand components of wellness and how they can positively impact overall health.  Patients will identify areas of wellness where they have developed good habits. Patients will identify areas of wellness where they would like to make improvements.    Summary of Patient Progress: Pt active and engaged throughout group, very involved in discussion of areas of wellness.  Identified physical and career as areas of wellness that he was doing good in, identified financial as an area of wellness that he needed to work on.       Therapeutic Modalities: Cognitive Behavioral Therapy Psychoeducation    Lorri Frederick, LCSW

## 2022-03-23 NOTE — [facility] Group Notes (Signed)
[facility] Group Notes:  (Nursing/MHT/Case Management/Adjunct)  Date:  03/23/2022  Time:  9:42 PM  Type of Therapy:   Warp up  Participation Level:  Active  Participation Quality:  Appropriate  Affect:  Appropriate  Cognitive:  Alert  Insight:  Good  Engagement in Group:  Engaged and he want go to Rehab.  Modes of Intervention:  Support  Summary of Progress/Problems:  Mayra Neer 03/23/2022, 9:42 PM

## 2022-03-23 NOTE — Plan of Care (Signed)
Patient is calm and cooperative, compliant with medications and treatment.  Patient denies SI HI AVH.  Patient monitored with q 15 minute checks.  No c/o distress.

## 2022-03-23 NOTE — Progress Notes (Signed)
Pt denies SI/HI/AVH and verbally agrees to approach staff if these become apparent or before harming themselves/others. Rates depression 6/10. Rates anxiety 8/10. Rates pain 0/10. Pt stated that his goal is to get to rehab and he was going to call people to achieve that goal. Pt was on the phone a little this am but has been in his room for most of the day but goes to meals and went outside. Pt complained of anxiety and ask for a PRN. Scheduled medications administered to pt, per MD orders. RN provided support and encouragement to pt. Q15 min safety checks implemented and continued. Pt safe on the unit. RN will continue to monitor and intervene as needed.   03/23/22 0810  Psych Admission Type (Psych Patients Only)  Admission Status Voluntary  Psychosocial Assessment  Patient Complaints Anxiety  Eye Contact Fair  Facial Expression Anxious  Affect Anxious  Speech Logical/coherent  Interaction Assertive  Motor Activity Other (Comment) (WDL)  Appearance/Hygiene In scrubs;Unremarkable  Behavior Characteristics Cooperative;Appropriate to situation;Anxious  Mood Anxious;Pleasant;Sad  Thought Process  Coherency WDL  Content WDL  Delusions None reported or observed  Perception WDL  Hallucination None reported or observed  Judgment WDL  Confusion None  Danger to Self  Current suicidal ideation? Denies  Danger to Others  Danger to Others None reported or observed

## 2022-03-24 DIAGNOSIS — F332 Major depressive disorder, recurrent severe without psychotic features: Secondary | ICD-10-CM | POA: Diagnosis not present

## 2022-03-24 LAB — LIPID PANEL
Cholesterol: 219 mg/dL — ABNORMAL HIGH (ref 0–200)
HDL: 38 mg/dL — ABNORMAL LOW (ref >40)
LDL Cholesterol: UNDETERMINED mg/dL (ref 0–99)
Total CHOL/HDL Ratio: 5.8 RATIO
Triglycerides: 476 mg/dL — ABNORMAL HIGH (ref <150)
VLDL: UNDETERMINED mg/dL (ref 0–40)

## 2022-03-24 LAB — LDL CHOLESTEROL, DIRECT: Direct LDL: 122 mg/dL — ABNORMAL HIGH (ref 0–99)

## 2022-03-24 MED ORDER — NALTREXONE HCL 50 MG PO TABS
25.0000 mg | ORAL_TABLET | Freq: Every day | ORAL | Status: AC
  Administered 2022-03-24: 25 mg via ORAL
  Filled 2022-03-24: qty 1

## 2022-03-24 NOTE — [facility] Group Notes (Signed)
[facility] Group Notes:  (Nursing/MHT/Case Management/Adjunct)  Date:  03/24/2022  Time:  10:25 AM  Type of Therapy:   Community Meeting  Participation Level:  Active  Participation Quality:  Appropriate, Attentive, and Sharing  Affect:  Appropriate  Cognitive:  Alert and Appropriate  Insight:  Appropriate and Good  Engagement in Group:  Engaged  Modes of Intervention:  Discussion, Education, and Support  Summary of Progress/Problems:  Lynelle Smoke Dvon Jiles 03/24/2022, 10:25 AM

## 2022-03-24 NOTE — Group Note (Signed)
[facility] LCSW Group Therapy Note    Group Date: 03/24/2022 Start Time: 1300 End Time: 1400  Type of Therapy and Topic:  Group Therapy:  Overcoming Obstacles  Participation Level:  [facility] PARTICIPATION LEVEL: Did Not Attend   Description of Group:   In this group patients will be encouraged to explore what they see as obstacles to their own wellness and recovery. They will be guided to discuss their thoughts, feelings, and behaviors related to these obstacles. The group will process together ways to cope with barriers, with attention given to specific choices patients can make. Each patient will be challenged to identify changes they are motivated to make in order to overcome their obstacles. This group will be process-oriented, with patients participating in exploration of their own experiences as well as giving and receiving support and challenge from other group members.  Therapeutic Goals: 1. Patient will identify personal and current obstacles as they relate to admission. 2. Patient will identify barriers that currently interfere with their wellness or overcoming obstacles.  3. Patient will identify feelings, thought process and behaviors related to these barriers. 4. Patient will identify two changes they are willing to make to overcome these obstacles:    Summary of Patient Progress Group not held due to acuity.   Therapeutic Modalities:   Cognitive Behavioral Therapy Solution Focused Therapy Motivational Interviewing Relapse Prevention Therapy   Kazue Cerro R Maciel Kegg, LCSW 

## 2022-03-24 NOTE — [facility] Counselor (Signed)
CSW updated pt regarding attempts at contact with [facility]. He was informed that CSW has left messages for the intake coordinator and co-owner. CSW asked if pt wanted any other facilities contacted. He stated that he was uncertain but that he would think about it. CSW asked about resource list for placement. Pt stated that he had not been given it. CSW informed him that he would be given list. No other concerns expressed. Contact ended without incident.   Pt was given residential placement resource list for review.  CSW will continue to follow.   Vilma Meckel. [name], MSW, LCSW, LCAS 03/24/2022 3:26 PM

## 2022-03-24 NOTE — Progress Notes (Signed)
Patient calm and pleasant during assessment. Pt denies SI/HI/AVH. Pt stated he was feeling better tonight. Pt compliant with medication administration per MD orders. Pt observed interacting appropriately with staff and peers on the unit. Pt given education, support, and encouragement to be active in his treatment plan. Pt being monitored Q 15 minutes for safety per unit protocol. Pt remains safe on the unit.  

## 2022-03-24 NOTE — [facility] Counselor (Addendum)
CSW contacted [facility] [phone]). CSW inquired regarding the receipt of requested paperwork. CSW was informed that they got the paperwork and pt completed his phone screening. Pt has been approved for their waitlist which is four weeks long currently. No other concerns expressed. Contact ended without incident.   Vilma Meckel. [name], MSW, LCSW, LCAS 03/24/2022 1:42 PM  CSW attempted to contact [facility] [phone]) regarding phone screening. CSW was unable to establish contact but voicemail left with contact information for follow through.   Vilma Meckel. [name], MSW, LCSW, LCAS 03/24/2022 2:58 PM

## 2022-03-24 NOTE — BH IP Treatment Plan (Signed)
Interdisciplinary Treatment and Diagnostic Plan Update  03/24/2022 Time of Session: 08:30 Lee Sparks MRN: [id number]  Principal Diagnosis: Major depressive disorder, recurrent severe without psychotic features (HCC)  Secondary Diagnoses: Principal Problem:   Major depressive disorder, recurrent severe without psychotic features (HCC)   Current Medications:  Current Facility-Administered Medications  Medication Dose Route Frequency Provider Last Rate Last Admin   acetaminophen (TYLENOL) tablet 650 mg  650 mg Oral Q6H PRN Charm Rings, NP       alum & mag hydroxide-simeth (MAALOX/MYLANTA) 200-200-20 MG/5ML suspension 30 mL  30 mL Oral Q4H PRN Charm Rings, NP       amLODipine (NORVASC) tablet 5 mg  5 mg Oral Daily Sarina Ill, DO   5 mg at 03/24/22 0746   citalopram (CELEXA) tablet 40 mg  40 mg Oral Daily [name], [name], MD   40 mg at 03/24/22 0746   folic acid (FOLVITE) tablet 1 mg  1 mg Oral Daily Sarina Ill, DO   1 mg at 03/24/22 0745   hydrOXYzine (ATARAX) tablet 50 mg  50 mg Oral Q6H PRN [name], [name], MD   50 mg at 03/24/22 0908   loratadine (CLARITIN) tablet 10 mg  10 mg Oral Daily [name], [name], MD   10 mg at 03/24/22 0745   magnesium hydroxide (MILK OF MAGNESIA) suspension 30 mL  30 mL Oral Daily PRN Charm Rings, NP       nicotine polacrilex (NICORETTE) gum 2 mg  2 mg Oral PRN Sarina Ill, DO   2 mg at 03/24/22 1051   QUEtiapine (SEROQUEL) tablet 200 mg  200 mg Oral QHS [name], [name], MD   200 mg at 03/23/22 2106   thiamine (VITAMIN B1) tablet 100 mg  100 mg Oral Daily Charm Rings, NP   100 mg at 03/24/22 0746   traZODone (DESYREL) tablet 100 mg  100 mg Oral QHS PRN Charm Rings, NP   100 mg at 03/20/22 2129   PTA Medications: Medications Prior to Admission  Medication Sig Dispense Refill Last Dose   amLODipine (NORVASC) 5 MG tablet Take 1 tablet (5 mg total) by mouth daily. (Patient not taking: Reported on  03/13/2022) 30 tablet 1    aspirin 81 MG EC tablet Take 1 tablet (81 mg total) by mouth daily. Swallow whole. (Patient not taking: Reported on 03/13/2022) 30 tablet 1    buPROPion (WELLBUTRIN XL) 150 MG 24 hr tablet Take 1 tablet (150 mg total) by mouth daily. (Patient not taking: Reported on 03/13/2022) 30 tablet 1    FLUoxetine (PROZAC) 20 MG capsule Take 1 capsule (20 mg total) by mouth daily. (Patient not taking: Reported on 03/13/2022) 30 capsule 1    fluticasone (FLONASE) 50 MCG/ACT nasal spray Place 2 sprays into both nostrils daily. (Patient not taking: Reported on 03/13/2022) 16 g 1    hydrOXYzine (ATARAX) 50 MG tablet Take 1 tablet (50 mg total) by mouth every 6 (six) hours as needed for anxiety (sleep). (Patient not taking: Reported on 03/13/2022) 60 tablet 1    ibuprofen (ADVIL) 600 MG tablet Take 1 tablet (600 mg total) by mouth every 6 (six) hours as needed for mild pain, moderate pain or headache. (Patient not taking: Reported on 03/13/2022) 60 tablet 1    loratadine (CLARITIN) 10 MG tablet Take 1 tablet (10 mg total) by mouth daily. (Patient not taking: Reported on 03/13/2022) 30 tablet 1    nicotine polacrilex (NICORETTE) 2  MG gum Take 1 each (2 mg total) by mouth as needed for smoking cessation. (Patient not taking: Reported on 03/13/2022) 110 each 0    pantoprazole (PROTONIX) 20 MG tablet Take 1 tablet (20 mg total) by mouth daily. (Patient not taking: Reported on 03/13/2022) 30 tablet 1    QUEtiapine (SEROQUEL) 100 MG tablet Take 1 tablet (100 mg total) by mouth at bedtime as needed (sleep). (Patient not taking: Reported on 03/13/2022) 30 tablet 1     Patient Stressors: Financial difficulties   Marital or family conflict   Medication change or noncompliance   Occupational concerns   Substance abuse    Patient Strengths: Capable of independent living  Manufacturing systems engineer  Work skills   Treatment Modalities: Medication Management, Group therapy, Case management,  1 to 1 session with  clinician, Psychoeducation, Recreational therapy.   Physician Treatment Plan for Primary Diagnosis: Major depressive disorder, recurrent severe without psychotic features (HCC) Long Term Goal(s): Improvement in symptoms so as ready for discharge   Short Term Goals: Ability to identify changes in lifestyle to reduce recurrence of condition will improve Ability to verbalize feelings will improve Ability to disclose and discuss suicidal ideas Ability to demonstrate self-control will improve Ability to identify and develop effective coping behaviors will improve Ability to maintain clinical measurements within normal limits will improve Compliance with prescribed medications will improve Ability to identify triggers associated with substance abuse/mental health issues will improve  Medication Management: Evaluate patient's response, side effects, and tolerance of medication regimen.  Therapeutic Interventions: 1 to 1 sessions, Unit Group sessions and Medication administration.  Evaluation of Outcomes: Progressing  Physician Treatment Plan for Secondary Diagnosis: Principal Problem:   Major depressive disorder, recurrent severe without psychotic features (HCC)  Long Term Goal(s): Improvement in symptoms so as ready for discharge   Short Term Goals: Ability to identify changes in lifestyle to reduce recurrence of condition will improve Ability to verbalize feelings will improve Ability to disclose and discuss suicidal ideas Ability to demonstrate self-control will improve Ability to identify and develop effective coping behaviors will improve Ability to maintain clinical measurements within normal limits will improve Compliance with prescribed medications will improve Ability to identify triggers associated with substance abuse/mental health issues will improve     Medication Management: Evaluate patient's response, side effects, and tolerance of medication regimen.  Therapeutic  Interventions: 1 to 1 sessions, Unit Group sessions and Medication administration.  Evaluation of Outcomes: Progressing   RN Treatment Plan for Primary Diagnosis: Major depressive disorder, recurrent severe without psychotic features (HCC) Long Term Goal(s): Knowledge of disease and therapeutic regimen to maintain health will improve  Short Term Goals: Ability to remain free from injury will improve, Ability to verbalize frustration and anger appropriately will improve, Ability to demonstrate self-control, Ability to participate in decision making will improve, Ability to verbalize feelings will improve, Ability to disclose and discuss suicidal ideas, Ability to identify and develop effective coping behaviors will improve, and Compliance with prescribed medications will improve  Medication Management: RN will administer medications as ordered by provider, will assess and evaluate patient's response and provide education to patient for prescribed medication. RN will report any adverse and/or side effects to prescribing provider.  Therapeutic Interventions: 1 on 1 counseling sessions, Psychoeducation, Medication administration, Evaluate responses to treatment, Monitor vital signs and CBGs as ordered, Perform/monitor CIWA, COWS, AIMS and Fall Risk screenings as ordered, Perform wound care treatments as ordered.  Evaluation of Outcomes: Progressing   LCSW Treatment Plan for Primary  Diagnosis: Major depressive disorder, recurrent severe without psychotic features (HCC) Long Term Goal(s): Safe transition to appropriate next level of care at discharge, Engage patient in therapeutic group addressing interpersonal concerns.  Short Term Goals: Engage patient in aftercare planning with referrals and resources, Increase social support, Increase ability to appropriately verbalize feelings, Increase emotional regulation, Facilitate acceptance of mental health diagnosis and concerns, Facilitate patient  progression through stages of change regarding substance use diagnoses and concerns, Identify triggers associated with mental health/substance abuse issues, and Increase skills for wellness and recovery  Therapeutic Interventions: Assess for all discharge needs, 1 to 1 time with Social worker, Explore available resources and support systems, Assess for adequacy in community support network, Educate family and significant other(s) on suicide prevention, Complete Psychosocial Assessment, Interpersonal group therapy.  Evaluation of Outcomes: Progressing   Progress in Treatment: Attending groups: Yes. Participating in groups: Yes. Taking medication as prescribed: Yes. Toleration medication: Yes. Family/Significant other contact made: No, will contact:  if given permission. Patient understands diagnosis: Yes. Discussing patient identified problems/goals with staff: Yes. Medical problems stabilized or resolved: Yes. Denies suicidal/homicidal ideation: Yes. Issues/concerns per patient self-inventory: No. Other: none.  New problem(s) identified: No, Describe:  none identified. 03/24/22 Update: No changes at this time.   New Short Term/Long Term Goal(s):  medication management for mood stabilization; elimination of SI thoughts; development of comprehensive mental wellness/sobriety plan. 03/24/22 Update: No changes at this time.   Patient Goals:  "Trying get medicine right so I won't be going through these spells again." 03/24/22 Update: No changes at this time.   Discharge Plan or Barriers: CSW will assist pt with development of an appropriate aftercare/discharge plan. 03/24/22 Update: Applications/referrals have been submitted to [facility], [facility], and [facility]. [facility] will not be able to take anyone for six weeks. Contacts have been made with [facility] regarding a screening that was scheduled for Friday, 03/21/22 at 9am which did not happen as intake coordinator could not be reached. CSW to follow  up on [facility].    Reason for Continuation of Hospitalization: Anxiety Depression Medication stabilization Suicidal ideation Withdrawal symptoms   Estimated Length of Stay:  1-7 days 03/24/22 Update: TBD  Last 3 [place] Suicide Severity Risk Score: Flowsheet Row Admission (Current) from 03/15/2022 in [facility] INPATIENT BEHAVIORAL MEDICINE ED from 03/13/2022 in [facility] Admission (Discharged) from 10/29/2021 in [facility] INPATIENT BEHAVIORAL MEDICINE  C-SSRS RISK CATEGORY No Risk High Risk Moderate Risk       Last PHQ 2/9 Scores:    05/10/2020    3:58 PM  Depression screen PHQ 2/9  Decreased Interest 0  Down, Depressed, Hopeless 1  PHQ - 2 Score 1  Altered sleeping 1  Tired, decreased energy 0  Change in appetite 0  Feeling bad or failure about yourself  0  Trouble concentrating 1  Moving slowly or fidgety/restless 0  Suicidal thoughts 0  PHQ-9 Score 3  Difficult doing work/chores Somewhat difficult    Scribe for Treatment Team: Glenis Smoker, LCSW 03/24/2022 1:32 PM

## 2022-03-24 NOTE — [facility] Counselor (Signed)
CSW attempted to contact [facility] [phone]) regarding phone screening. CSW was unable to establish contact and voicemail box is full.   Vilma Meckel. [name], MSW, LCSW, LCAS 03/24/2022 1:30 PM

## 2022-03-24 NOTE — Progress Notes (Signed)
Pt presents with pleasant mood, affect anxious. Tasman states he is doing '' well '' overall, however continues to report intermittent anxiety for which he requested prn vistaril. Expressed this is helpful for him. He does report that [place]. Denies any SI or HI or A/V Hallucinations. Discussed healthy coping skills to address anxiety and patient verbalized understanding. He completed his self inventory and rates his depression at a 6/ on scale, with 10 being worst, 0 being none. He also rates his hopelessness at 6/10 on same scale, with 10 being worst. He rates his anxiety at 8/10 this am, 10 being worst on scale, prior to receiving vistaril. He states his goal is to get to a rehab and to talk to a counselor.  Pt reports sleeping and eating well and no other voiced concerns. Has been visible on the unit attending programming and meals. Pt is safe.

## 2022-03-24 NOTE — Progress Notes (Addendum)
[facility] MD Progress Note  03/24/2022 2:54 PM Lee Sparks  MRN:  [id number]  Subjective: Follow-up 51 year old man with depression and alcohol abuse.  He was watching television in the day room this afternoon.  Mild to moderate depression with no suicidal ideations.  High anxiety as he is worried about where he will live, hoping to get into [employer] in [place].  His sleep was good last night with hydroxyzine and Seroquel.  Appetite is "good, too good".  Discussed cravings and the use of naltrexone to assist, he is agreeable.  Denies side effects from his medications.  Principal Problem: Major depressive disorder, recurrent severe without psychotic features ([place]) Diagnosis: Principal Problem:   Major depressive disorder, recurrent severe without psychotic features ([place])  Total Time spent with patient: 20 minutes  Past Psychiatric History: Past history of recurrent depression and anxiety  Past Medical History:  Past Medical History:  Diagnosis Date   Alcohol-induced pancreatitis    Anemia    Asthma    Bronchitis     Past Surgical History:  Procedure Laterality Date   CYSTECTOMY     ESOPHAGOGASTRODUODENOSCOPY  07/28/2011   Procedure: ESOPHAGOGASTRODUODENOSCOPY (EGD);  Surgeon: Landry Dyke, MD;  Location: [facility] ENDOSCOPY;  Service: Endoscopy;  Laterality: N/A;   HERNIA REPAIR     Family History:  Family History  Problem Relation Age of Onset   Hypertension Father    Aneurysm Father    Cancer Other    Hypertension Mother    Pneumonia Mother    Family Psychiatric  History: See previous Social History:  Social History   Substance and Sexual Activity  Alcohol Use Yes   Alcohol/week: 50.0 standard drinks of alcohol   Types: 50 Cans of beer per week   Comment: Equivalent of two 40 oz beers daily.     Social History   Substance and Sexual Activity  Drug Use No    Social History   Socioeconomic History   Marital status: Single    Spouse name: Not on file    Number of children: Not on file   Years of education: Not on file   Highest education level: Not on file  Occupational History   Not on file  Tobacco Use   Smoking status: Every Day    Packs/day: 0.50    Years: 15.00    Total pack years: 7.50    Types: Cigarettes    Last attempt to quit: 06/27/2011    Years since quitting: 10.7   Smokeless tobacco: Never  Substance and Sexual Activity   Alcohol use: Yes    Alcohol/week: 50.0 standard drinks of alcohol    Types: 50 Cans of beer per week    Comment: Equivalent of two 40 oz beers daily.   Drug use: No   Sexual activity: Not Currently  Other Topics Concern   Not on file  Social History Narrative   Not on file   Social Determinants of Health   Financial Resource Strain: Not on file  Food Insecurity: Not on file  Transportation Needs: Not on file  Physical Activity: Not on file  Stress: Not on file  Social Connections: Not on file   Additional Social History:                         Sleep: Fair  Appetite:  Fair  Current Medications: Current Facility-Administered Medications  Medication Dose Route Frequency Provider Last Rate Last Admin   acetaminophen (TYLENOL)  tablet 650 mg  650 mg Oral Q6H PRN Charm Rings, NP       alum & mag hydroxide-simeth (MAALOX/MYLANTA) 200-200-20 MG/5ML suspension 30 mL  30 mL Oral Q4H PRN Charm Rings, NP       amLODipine (NORVASC) tablet 5 mg  5 mg Oral Daily Sarina Ill, DO   5 mg at 03/24/22 0746   citalopram (CELEXA) tablet 40 mg  40 mg Oral Daily [name], [name], MD   40 mg at 03/24/22 0746   folic acid (FOLVITE) tablet 1 mg  1 mg Oral Daily Sarina Ill, DO   1 mg at 03/24/22 0745   hydrOXYzine (ATARAX) tablet 50 mg  50 mg Oral Q6H PRN [name], [name], MD   50 mg at 03/24/22 0908   loratadine (CLARITIN) tablet 10 mg  10 mg Oral Daily [name], [name], MD   10 mg at 03/24/22 0745   magnesium hydroxide (MILK OF MAGNESIA) suspension 30 mL  30 mL Oral  Daily PRN Charm Rings, NP       nicotine polacrilex (NICORETTE) gum 2 mg  2 mg Oral PRN Sarina Ill, DO   2 mg at 03/24/22 1051   QUEtiapine (SEROQUEL) tablet 200 mg  200 mg Oral QHS [name], [name], MD   200 mg at 03/23/22 2106   thiamine (VITAMIN B1) tablet 100 mg  100 mg Oral Daily Charm Rings, NP   100 mg at 03/24/22 0746   traZODone (DESYREL) tablet 100 mg  100 mg Oral QHS PRN Charm Rings, NP   100 mg at 03/20/22 2129    Lab Results: No results found for this or any previous visit (from the past 48 hour(s)).  Blood Alcohol level:  Lab Results  Component Value Date   ETH 91 (H) 03/13/2022   ETH 108 (H) 10/29/2021    Metabolic Disorder Labs: Lab Results  Component Value Date   HGBA1C 5.7 (H) 05/30/2020   No results found for: "PROLACTIN" Lab Results  Component Value Date   CHOL 206 (H) 05/30/2020   TRIG 187 (H) 05/30/2020   HDL 28 (L) 05/30/2020   CHOLHDL 7.4 (H) 05/30/2020   LDLCALC 144 (H) 05/30/2020   LDLCALC 98 06/28/2013    Physical Findings: AIMS: Facial and Oral Movements Muscles of Facial Expression: None, normal Lips and Perioral Area: None, normal Jaw: None, normal Tongue: None, normal,Extremity Movements Upper (arms, wrists, hands, fingers): None, normal Lower (legs, knees, ankles, toes): None, normal, Trunk Movements Neck, shoulders, hips: None, normal, Overall Severity Severity of abnormal movements (highest score from questions above): None, normal Incapacitation due to abnormal movements: None, normal Patient's awareness of abnormal movements (rate only patient's report): No Awareness, Dental Status Current problems with teeth and/or dentures?: No Does patient usually wear dentures?: No  CIWA:  CIWA-Ar Total: 2 COWS:     Musculoskeletal: Strength & Muscle Tone: within normal limits Gait & Station: normal Patient leans: N/A  Psychiatric Specialty Exam: Physical Exam Vitals and nursing note reviewed.  Constitutional:       Appearance: Normal appearance.  HENT:     Head: Normocephalic and atraumatic.     Nose: Nose normal.  Pulmonary:     Effort: Pulmonary effort is normal.  Musculoskeletal:        General: Normal range of motion.     Cervical back: Normal range of motion.  Neurological:     General: No focal deficit present.     Mental  Status: He is alert and oriented to person, place, and time.  Psychiatric:        Attention and Perception: Attention and perception normal.        Mood and Affect: Mood is anxious and depressed.        Speech: Speech normal.        Behavior: Behavior normal. Behavior is cooperative.        Thought Content: Thought content normal.        Cognition and Memory: Cognition and memory normal.        Judgment: Judgment normal.     Review of Systems  Constitutional: Negative.   HENT: Negative.    Eyes: Negative.   Respiratory: Negative.    Cardiovascular: Negative.   Gastrointestinal: Negative.   Musculoskeletal: Negative.   Skin: Negative.   Neurological: Negative.   Psychiatric/Behavioral:  Positive for depression and substance abuse. The patient is nervous/anxious.     Blood pressure 115/82, pulse 87, temperature 97.8 F (36.6 C), temperature source Oral, resp. rate 18, height 5' 8.5" (1.74 m), weight 88.9 kg, SpO2 96 %.Body mass index is 29.36 kg/m.  General Appearance: Casual  Eye Contact:  Good  Speech:  Normal Rate  Volume:  Normal  Mood:  Anxious and Depressed  Affect:  Congruent  Thought Process:  Coherent  Orientation:  Full (Time, Place, and Person)  Thought Content:  WDL and Logical  Suicidal Thoughts:  No  Homicidal Thoughts:  No  Memory:  Immediate;   Good Recent;   Good Remote;   Good  Judgement:  Fair  Insight:  Fair  Psychomotor Activity:  Normal  Concentration:  Concentration: Good and Attention Span: Good  Recall:  Good  Fund of Knowledge:  Good  Language:  Good  Akathisia:  No  Handed:  Right  AIMS (if indicated):     Assets:   Communication Skills Leisure Time Physical Health Resilience  ADL's:  Intact  Cognition:  WNL  Sleep:         Physical Exam: Physical Exam Vitals and nursing note reviewed.  Constitutional:      Appearance: Normal appearance.  HENT:     Head: Normocephalic and atraumatic.     Nose: Nose normal.  Pulmonary:     Effort: Pulmonary effort is normal.  Musculoskeletal:        General: Normal range of motion.     Cervical back: Normal range of motion.  Neurological:     General: No focal deficit present.     Mental Status: He is alert and oriented to person, place, and time.  Psychiatric:        Attention and Perception: Attention and perception normal.        Mood and Affect: Mood is anxious and depressed.        Speech: Speech normal.        Behavior: Behavior normal. Behavior is cooperative.        Thought Content: Thought content normal.        Cognition and Memory: Cognition and memory normal.        Judgment: Judgment normal.    Review of Systems  Constitutional: Negative.   HENT: Negative.    Eyes: Negative.   Respiratory: Negative.    Cardiovascular: Negative.   Gastrointestinal: Negative.   Musculoskeletal: Negative.   Skin: Negative.   Neurological: Negative.   Psychiatric/Behavioral:  Positive for depression and substance abuse. The patient is nervous/anxious.    Blood pressure 115/82, pulse  87, temperature 97.8 F (36.6 C), temperature source Oral, resp. rate 18, height 5' 8.5" (1.74 m), weight 88.9 kg, SpO2 96 %. Body mass index is 29.36 kg/m.   Treatment Plan Summary: Major depressive disorder, moderate: Continue Celexa 40 mg daily  Alcohol use disorder: Started naltrexone 25 mg once  Insomnia: SEroquel 200 mg daily at bedtime Trazodone 100 mg at bedtime PRN  Anxiety: Hydroxyzine 50 mg every 6 hours PRN   Nanine Means, NP 03/24/2022, 2:54 PM

## 2022-03-24 NOTE — Progress Notes (Signed)
Recreation Therapy Notes  Date: 03/24/2022   Time: 10:50 am     Location: Craft room   Behavioral response: Appropriate  Intervention Topic:  Relaxation    Discussion/Intervention:  Group content today was focused on relaxation. The group defined relaxation and identified healthy ways to relax. Individuals expressed how much time they spend relaxing. Patients expressed how much their life would be if they did not make time for themselves to relax. The group stated ways they could improve their relaxation techniques in the future.  Individuals participated in the intervention "Time to Relax" where they had a chance to experience different relaxation techniques.  Clinical Observations/Feedback: Patient came to group and defined relaxation as laying on a recliner or on the beach. He identified relaxation as white noise, walking, or listening to music. Participant expressed that relaxation is important to have time away from stress. Individual was social with peers and staff while participating in the intervention.  Mel Tadros LRT/CTRS        Olly Shiner 03/24/2022 1:20 PM

## 2022-03-25 DIAGNOSIS — F332 Major depressive disorder, recurrent severe without psychotic features: Secondary | ICD-10-CM | POA: Diagnosis not present

## 2022-03-25 LAB — HEMOGLOBIN A1C
Hgb A1c MFr Bld: 5.2 % (ref 4.8–5.6)
Mean Plasma Glucose: 102.54 mg/dL

## 2022-03-25 MED ORDER — DOCUSATE SODIUM 100 MG PO CAPS
200.0000 mg | ORAL_CAPSULE | Freq: Two times a day (BID) | ORAL | Status: DC
  Administered 2022-03-25 – 2022-04-02 (×16): 200 mg via ORAL
  Filled 2022-03-25 (×17): qty 2

## 2022-03-25 MED ORDER — NALTREXONE HCL 50 MG PO TABS
50.0000 mg | ORAL_TABLET | Freq: Every day | ORAL | Status: DC
  Administered 2022-03-25 – 2022-04-02 (×9): 50 mg via ORAL
  Filled 2022-03-25 (×9): qty 1

## 2022-03-25 NOTE — Progress Notes (Signed)
Patient calm and pleasant during assessment. Pt denies SI/HI/AVH. Pt stated he was feeling better tonight. Pt compliant with medication administration per MD orders. Pt observed interacting appropriately with staff and peers on the unit. Pt given education, support, and encouragement to be active in his treatment plan. Pt being monitored Q 15 minutes for safety per unit protocol. Pt remains safe on the unit.  

## 2022-03-25 NOTE — Progress Notes (Signed)
[facility] MD Progress Note  03/25/2022 10:15 AM Lee Sparks  MRN:  [id number]  Subjective: Follow-up 51 year old man with depression and alcohol abuse.  He was reviewing rehab places as [employer] is not answering phones or busy signal received.  Mild depression with no suicidal ideations.  Anxiety was higher earlier this morning, relieved with hydroxyzine.  Sleep "good", appetite not as high which may be related to naltrexone.  He views this as a good thing as he has gained weight.  Denies side effects from his medications.  Principal Problem: Major depressive disorder, recurrent severe without psychotic features ([facility]) Diagnosis: Principal Problem:   Major depressive disorder, recurrent severe without psychotic features ([facility])  Total Time spent with patient: 20 minutes  Past Psychiatric History: Past history of recurrent depression and anxiety  Past Medical History:  Past Medical History:  Diagnosis Date   Alcohol-induced pancreatitis    Anemia    Asthma    Bronchitis     Past Surgical History:  Procedure Laterality Date   CYSTECTOMY     ESOPHAGOGASTRODUODENOSCOPY  07/28/2011   Procedure: ESOPHAGOGASTRODUODENOSCOPY (EGD);  Surgeon: Freddy Jaksch, MD;  Location: [facility] ENDOSCOPY;  Service: Endoscopy;  Laterality: N/A;   HERNIA REPAIR     Family History:  Family History  Problem Relation Age of Onset   Hypertension Father    Aneurysm Father    Cancer Other    Hypertension Mother    Pneumonia Mother    Family Psychiatric  History: See previous Social History:  Social History   Substance and Sexual Activity  Alcohol Use Yes   Alcohol/week: 50.0 standard drinks of alcohol   Types: 50 Cans of beer per week   Comment: Equivalent of two 40 oz beers daily.     Social History   Substance and Sexual Activity  Drug Use No    Social History   Socioeconomic History   Marital status: Single    Spouse name: Not on file   Number of children: Not on file   Years of education:  Not on file   Highest education level: Not on file  Occupational History   Not on file  Tobacco Use   Smoking status: Every Day    Packs/day: 0.50    Years: 15.00    Total pack years: 7.50    Types: Cigarettes    Last attempt to quit: 06/27/2011    Years since quitting: 10.7   Smokeless tobacco: Never  Substance and Sexual Activity   Alcohol use: Yes    Alcohol/week: 50.0 standard drinks of alcohol    Types: 50 Cans of beer per week    Comment: Equivalent of two 40 oz beers daily.   Drug use: No   Sexual activity: Not Currently  Other Topics Concern   Not on file  Social History Narrative   Not on file   Social Determinants of Health   Financial Resource Strain: Not on file  Food Insecurity: Not on file  Transportation Needs: Not on file  Physical Activity: Not on file  Stress: Not on file  Social Connections: Not on file   Additional Social History:                         Sleep: Fair  Appetite:  Fair  Current Medications: Current Facility-Administered Medications  Medication Dose Route Frequency Provider Last Rate Last Admin   acetaminophen (TYLENOL) tablet 650 mg  650 mg Oral Q6H PRN Nanine Means  Y, NP       alum & mag hydroxide-simeth (MAALOX/MYLANTA) 200-200-20 MG/5ML suspension 30 mL  30 mL Oral Q4H PRN Charm Rings, NP       amLODipine (NORVASC) tablet 5 mg  5 mg Oral Daily Sarina Ill, DO   5 mg at 03/25/22 0831   citalopram (CELEXA) tablet 40 mg  40 mg Oral Daily [name], [name], MD   40 mg at 03/25/22 0831   folic acid (FOLVITE) tablet 1 mg  1 mg Oral Daily Sarina Ill, DO   1 mg at 03/25/22 0831   hydrOXYzine (ATARAX) tablet 50 mg  50 mg Oral Q6H PRN [name], [name], MD   50 mg at 03/25/22 0831   loratadine (CLARITIN) tablet 10 mg  10 mg Oral Daily [name], [name], MD   10 mg at 03/25/22 0831   magnesium hydroxide (MILK OF MAGNESIA) suspension 30 mL  30 mL Oral Daily PRN Charm Rings, NP       nicotine polacrilex  (NICORETTE) gum 2 mg  2 mg Oral PRN Sarina Ill, DO   2 mg at 03/25/22 0831   QUEtiapine (SEROQUEL) tablet 200 mg  200 mg Oral QHS [name], [name], MD   200 mg at 03/24/22 2045   thiamine (VITAMIN B1) tablet 100 mg  100 mg Oral Daily Charm Rings, NP   100 mg at 03/25/22 0831   traZODone (DESYREL) tablet 100 mg  100 mg Oral QHS PRN Charm Rings, NP   100 mg at 03/20/22 2129    Lab Results:  Results for orders placed or performed during the hospital encounter of 03/15/22 (from the past 48 hour(s))  Lipid panel     Status: Abnormal   Collection Time: 03/24/22  3:41 PM  Result Value Ref Range   Cholesterol 219 (H) 0 - 200 mg/dL   Triglycerides 220 (H) <150 mg/dL   HDL 38 (L) >25 mg/dL   Total CHOL/HDL Ratio 5.8 RATIO   VLDL UNABLE TO CALCULATE IF TRIGLYCERIDE OVER 400 mg/dL 0 - 40 mg/dL   LDL Cholesterol UNABLE TO CALCULATE IF TRIGLYCERIDE OVER 400 mg/dL 0 - 99 mg/dL    Comment:        Total Cholesterol/HDL:CHD Risk Coronary Heart Disease Risk Table                     Men   Women  1/2 Average Risk   3.4   3.3  Average Risk       5.0   4.4  2 X Average Risk   9.6   7.1  3 X Average Risk  23.4   11.0        Use the calculated Patient Ratio above and the CHD Risk Table to determine the patient's CHD Risk.        ATP III CLASSIFICATION (LDL):  <100     mg/dL   Optimal  427-062  mg/dL   Near or Above                    Optimal  130-159  mg/dL   Borderline  376-283  mg/dL   High  >151     mg/dL   Very High Performed at [facility], [address] [place], [place] [address]   Hemoglobin A1c     Status: None   Collection Time: 03/24/22  3:41 PM  Result Value Ref Range   Hgb A1c  MFr Bld 5.2 4.8 - 5.6 %    Comment: (NOTE) Pre diabetes:          5.7%-6.4%  Diabetes:              >6.4%  Glycemic control for   <7.0% adults with diabetes    Mean Plasma Glucose 102.54 mg/dL    Comment: Performed at [facility], [address] [place],  [place] [address]  LDL cholesterol, direct     Status: Abnormal   Collection Time: 03/24/22  3:41 PM  Result Value Ref Range   Direct LDL 122 (H) 0 - 99 mg/dL    Comment: Performed at [facility], [address] [place], [place] [address]    Blood Alcohol level:  Lab Results  Component Value Date   ETH 91 (H) 03/13/2022   ETH 108 (H) 10/29/2021    Metabolic Disorder Labs: Lab Results  Component Value Date   HGBA1C 5.2 03/24/2022   MPG 102.54 03/24/2022   No results found for: "PROLACTIN" Lab Results  Component Value Date   CHOL 219 (H) 03/24/2022   TRIG 476 (H) 03/24/2022   HDL 38 (L) 03/24/2022   CHOLHDL 5.8 03/24/2022   VLDL UNABLE TO CALCULATE IF TRIGLYCERIDE OVER 400 mg/dL 09/81/1914   LDLCALC UNABLE TO CALCULATE IF TRIGLYCERIDE OVER 400 mg/dL 78/29/5621   LDLCALC 308 (H) 05/30/2020    Physical Findings: AIMS: Facial and Oral Movements Muscles of Facial Expression: None, normal Lips and Perioral Area: None, normal Jaw: None, normal Tongue: None, normal,Extremity Movements Upper (arms, wrists, hands, fingers): None, normal Lower (legs, knees, ankles, toes): None, normal, Trunk Movements Neck, shoulders, hips: None, normal, Overall Severity Severity of abnormal movements (highest score from questions above): None, normal Incapacitation due to abnormal movements: None, normal Patient's awareness of abnormal movements (rate only patient's report): No Awareness, Dental Status Current problems with teeth and/or dentures?: No Does patient usually wear dentures?: No  CIWA:  CIWA-Ar Total: 2 COWS:     Musculoskeletal: Strength & Muscle Tone: within normal limits Gait & Station: normal Patient leans: N/A  Psychiatric Specialty Exam: Physical Exam Vitals and nursing note reviewed.  Constitutional:      Appearance: Normal appearance.  HENT:     Head: Normocephalic and atraumatic.     Nose: Nose normal.  Pulmonary:     Effort: Pulmonary effort is normal.   Musculoskeletal:        General: Normal range of motion.     Cervical back: Normal range of motion.  Neurological:     General: No focal deficit present.     Mental Status: He is alert and oriented to person, place, and time.  Psychiatric:        Attention and Perception: Attention and perception normal.        Mood and Affect: Mood is anxious and depressed.        Speech: Speech normal.        Behavior: Behavior normal. Behavior is cooperative.        Thought Content: Thought content normal.        Cognition and Memory: Cognition and memory normal.        Judgment: Judgment normal.     Review of Systems  Constitutional: Negative.   HENT: Negative.    Eyes: Negative.   Respiratory: Negative.    Cardiovascular: Negative.   Gastrointestinal: Negative.   Musculoskeletal: Negative.   Skin: Negative.   Neurological: Negative.   Psychiatric/Behavioral:  Positive for depression and substance abuse. The patient is nervous/anxious.     Blood pressure (!) 122/91, pulse 71, temperature 97.8 F (36.6 C), temperature source Oral, resp. rate 18, height 5' 8.5" (1.74 m), weight 88.9 kg, SpO2 99 %.Body mass index is 29.36 kg/m.  General Appearance: Casual  Eye Contact:  Good  Speech:  Normal Rate  Volume:  Normal  Mood:  Anxious and Depressed  Affect:  Congruent  Thought Process:  Coherent  Orientation:  Full (Time, Place, and Person)  Thought Content:  WDL and Logical  Suicidal Thoughts:  No  Homicidal Thoughts:  No  Memory:  Immediate;   Good Recent;   Good Remote;   Good  Judgement:  Fair  Insight:  Fair  Psychomotor Activity:  Normal  Concentration:  Concentration: Good and Attention Span: Good  Recall:  Good  Fund of Knowledge:  Good  Language:  Good  Akathisia:  No  Handed:  Right  AIMS (if indicated):     Assets:  Communication Skills Leisure Time Physical Health Resilience  ADL's:  Intact  Cognition:  WNL  Sleep:         Physical Exam: Physical Exam Vitals  and nursing note reviewed.  Constitutional:      Appearance: Normal appearance.  HENT:     Head: Normocephalic and atraumatic.     Nose: Nose normal.  Pulmonary:     Effort: Pulmonary effort is normal.  Musculoskeletal:        General: Normal range of motion.     Cervical back: Normal range of motion.  Neurological:     General: No focal deficit present.     Mental Status: He is alert and oriented to person, place, and time.  Psychiatric:        Attention and Perception: Attention and perception normal.        Mood and Affect: Mood is anxious and depressed.        Speech: Speech normal.        Behavior: Behavior normal. Behavior is cooperative.        Thought Content: Thought content normal.        Cognition and Memory: Cognition and memory normal.        Judgment: Judgment normal.    Review of Systems  Constitutional: Negative.   HENT: Negative.    Eyes: Negative.   Respiratory: Negative.    Cardiovascular: Negative.   Gastrointestinal: Negative.   Musculoskeletal: Negative.   Skin: Negative.   Neurological: Negative.   Psychiatric/Behavioral:  Positive for depression and substance abuse. The patient is nervous/anxious.    Blood pressure (!) 122/91, pulse 71, temperature 97.8 F (36.6 C), temperature source Oral, resp. rate 18, height 5' 8.5" (1.74 m), weight 88.9 kg, SpO2 99 %. Body mass index is 29.36 kg/m.   Treatment Plan Summary: Major depressive disorder, moderate: Continue Celexa 40 mg daily  Alcohol use disorder: Increased naltrexone to 50 mg daily  Insomnia: SEroquel 200 mg daily at bedtime Trazodone 100 mg at bedtime PRN  Anxiety: Hydroxyzine 50 mg every 6 hours PRN   Nanine Means, NP 03/25/2022, 10:15 AM

## 2022-03-25 NOTE — Group Note (Signed)
LCSW Group Therapy Note  Group Date: 03/25/2022 Start Time: 1300 End Time: 1400   Type of Therapy and Topic:  Group Therapy: Using "I" Statements  Participation Level:  Active  Description of Group:  Patients were asked to provide details of some interpersonal conflicts they have experienced. Patients were then educated about "I" statements, communication which focuses on feelings or views of the speaker rather than what the other person is doing. T group members were asked to reflect on past conflicts and to provide specific examples for utilizing "I" statements.  Therapeutic Goals:  Patients will verbalize understanding of ineffective communication and effective communication. Patients will be able to empathize with whom they are having conflict. Patients will practice effective communication in the form of "I" statements.    Summary of Patient Progress:    Patient was present for the entirety of the group session. Patient was an active listener and participated in the topic of discussion, provided helpful advice to others, and added nuance to topic of conversation.  Patient engaged in brainstorming activity, considering parts of communication (body language, tone, message, word choice, etc.)  Therapeutic Modalities:   Cognitive Behavioral Therapy Solution-Focused Therapy    Corky Crafts, LCSW 03/25/2022  2:28 PM

## 2022-03-25 NOTE — Plan of Care (Signed)
D- Patient alert and oriented. Patient presented in an anxious, but pleasant mood on assessment stating that he slept "pretty good" last night and had no complaints to voice to this Clinical research associate. Patient endorsed both depression and anxiety, rating them a "6/10" and "8/10", stating that "I feel like I ain't gone get out of here, not going to find a long-term rehab". Patient denied SI, HI, AVH, and pain at this time. Per his self-inventory, patient's goal for today is "getting to rehab", in which he will "talk to counselor", in order to achieve his goal.  A- Scheduled medications administered to patient, per MD orders. Support and encouragement provided.  Routine safety checks conducted every 15 minutes.  Patient informed to notify staff with problems or concerns.  R- No adverse drug reactions noted. Patient contracts for safety at this time. Patient compliant with medications and treatment plan. Patient receptive, calm, and cooperative. Patient interacts well with others on the unit.  Patient remains safe at this time.  Problem: Education: Goal: Knowledge of General Education information will improve Description: Including pain rating scale, medication(s)/side effects and non-pharmacologic comfort measures Outcome: Progressing   Problem: Health Behavior/Discharge Planning: Goal: Ability to manage health-related needs will improve Outcome: Progressing   Problem: Clinical Measurements: Goal: Ability to maintain clinical measurements within normal limits will improve Outcome: Progressing Goal: Will remain free from infection Outcome: Progressing Goal: Diagnostic test results will improve Outcome: Progressing Goal: Respiratory complications will improve Outcome: Progressing Goal: Cardiovascular complication will be avoided Outcome: Progressing   Problem: Activity: Goal: Risk for activity intolerance will decrease Outcome: Progressing   Problem: Nutrition: Goal: Adequate nutrition will be  maintained Outcome: Progressing   Problem: Coping: Goal: Level of anxiety will decrease Outcome: Progressing   Problem: Elimination: Goal: Will not experience complications related to bowel motility Outcome: Progressing Goal: Will not experience complications related to urinary retention Outcome: Progressing   Problem: Pain Managment: Goal: General experience of comfort will improve Outcome: Progressing   Problem: Safety: Goal: Ability to remain free from injury will improve Outcome: Progressing   Problem: Skin Integrity: Goal: Risk for impaired skin integrity will decrease Outcome: Progressing   Problem: Education: Goal: Knowledge of [facility] Education information/materials will improve Outcome: Progressing Goal: Emotional status will improve Outcome: Progressing Goal: Mental status will improve Outcome: Progressing Goal: Verbalization of understanding the information provided will improve Outcome: Progressing   Problem: Activity: Goal: Interest or engagement in activities will improve Outcome: Progressing Goal: Sleeping patterns will improve Outcome: Progressing   Problem: Coping: Goal: Ability to verbalize frustrations and anger appropriately will improve Outcome: Progressing Goal: Ability to demonstrate self-control will improve Outcome: Progressing   Problem: Health Behavior/Discharge Planning: Goal: Identification of resources available to assist in meeting health care needs will improve Outcome: Progressing Goal: Compliance with treatment plan for underlying cause of condition will improve Outcome: Progressing   Problem: Physical Regulation: Goal: Ability to maintain clinical measurements within normal limits will improve Outcome: Progressing   Problem: Safety: Goal: Periods of time without injury will increase Outcome: Progressing   Problem: Education: Goal: Ability to make informed decisions regarding treatment will improve Outcome:  Progressing   Problem: Medication: Goal: Compliance with prescribed medication regimen will improve Outcome: Progressing   Problem: Education: Goal: Utilization of techniques to improve thought processes will improve Outcome: Progressing Goal: Knowledge of the prescribed therapeutic regimen will improve Outcome: Progressing   Problem: Activity: Goal: Interest or engagement in leisure activities will improve Outcome: Progressing Goal: Imbalance in normal sleep/wake  cycle will improve Outcome: Progressing   Problem: Coping: Goal: Coping ability will improve Outcome: Progressing Goal: Will verbalize feelings Outcome: Progressing   Problem: Health Behavior/Discharge Planning: Goal: Ability to make decisions will improve Outcome: Progressing Goal: Compliance with therapeutic regimen will improve Outcome: Progressing   Problem: Role Relationship: Goal: Will demonstrate positive changes in social behaviors and relationships Outcome: Progressing   Problem: Safety: Goal: Ability to disclose and discuss suicidal ideas will improve Outcome: Progressing Goal: Ability to identify and utilize support systems that promote safety will improve Outcome: Progressing   Problem: Self-Concept: Goal: Will verbalize positive feelings about self Outcome: Progressing Goal: Level of anxiety will decrease Outcome: Progressing

## 2022-03-26 DIAGNOSIS — F332 Major depressive disorder, recurrent severe without psychotic features: Secondary | ICD-10-CM | POA: Diagnosis not present

## 2022-03-26 MED ORDER — SENNOSIDES-DOCUSATE SODIUM 8.6-50 MG PO TABS
2.0000 | ORAL_TABLET | Freq: Every day | ORAL | Status: DC | PRN
  Administered 2022-03-26: 2 via ORAL
  Filled 2022-03-26: qty 2

## 2022-03-26 NOTE — Group Note (Signed)
[facility] LCSW Group Therapy Note   Group Date: 03/26/2022 Start Time: 1300 End Time: 1400   Type of Therapy/Topic:  Group Therapy:  Emotion Regulation  Participation Level:  Did Not Attend    Description of Group:    The purpose of this group is to assist patients in learning to regulate negative emotions and experience positive emotions. Patients will be guided to discuss ways in which they have been vulnerable to their negative emotions. These vulnerabilities will be juxtaposed with experiences of positive emotions or situations, and patients challenged to use positive emotions to combat negative ones. Special emphasis will be placed on coping with negative emotions in conflict situations, and patients will process healthy conflict resolution skills.  Therapeutic Goals: Patient will identify two positive emotions or experiences to reflect on in order to balance out negative emotions:  Patient will label two or more emotions that they find the most difficult to experience:  Patient will be able to demonstrate positive conflict resolution skills through discussion or role plays:   Summary of Patient Progress: Patient declined to attend group despite invitation by CSW.   Therapeutic Modalities:   Cognitive Behavioral Therapy Feelings Identification Dialectical Behavioral Therapy   Tatem Fesler R Birch Farino, LCSW 

## 2022-03-26 NOTE — Progress Notes (Signed)
Pt denies SI/HI/AVH and verbally agrees to approach staff if these become apparent or before harming themselves/others. Rates depression 5/10. Rates anxiety 8/10. Rates pain 0/10. Greene complained of having very hard stool. Pt stated that he started to have some bleeding. Pt stated that he dreads having to try to have a BM. The MD was notified and a PRN was ordered and given. Pt has been in his room for most of the day. Pt has the same depression and anxiety. Scheduled medications administered to pt, per MD orders. RN provided support and encouragement to pt. Q15 min safety checks implemented and continued. Pt safe on the unit. RN will continue to monitor and intervene as needed.  Problem: Clinical Measurements: Goal: Cardiovascular complication will be avoided Outcome: Progressing   Problem: Activity: Goal: Risk for activity intolerance will decrease Outcome: Progressing   Problem: Nutrition: Goal: Adequate nutrition will be maintained Outcome: Progressing    03/26/22 0752  Psych Admission Type (Psych Patients Only)  Admission Status Voluntary  Psychosocial Assessment  Patient Complaints Anxiety;Depression  Eye Contact Fair  Facial Expression Sad  Affect Sad;Anxious  Speech Logical/coherent  Interaction Assertive  Motor Activity Slow  Appearance/Hygiene Unremarkable  Behavior Characteristics Cooperative;Appropriate to situation;Anxious  Mood Depressed;Anxious;Pleasant  Thought Process  Coherency WDL  Content WDL  Delusions None reported or observed  Perception WDL  Hallucination None reported or observed  Judgment WDL  Confusion None  Danger to Self  Current suicidal ideation? Denies  Danger to Others  Danger to Others None reported or observed

## 2022-03-26 NOTE — [facility] Counselor (Addendum)
CSW met with pt briefly, per request. He stated that he was interested in applying for [facility]. CSW informed him that it was an application and that he would be brought it later in the day. He agreed. Pt also inquired about programs that [employer] intake coordinator mentioned. CSW stated that she specifically mentioned [facility] and [facility]. He stated that he would like to apply to these as well. No other concerns expressed. Contact ended without incident.   Pt received application to complete for [facility] program.   CSW met with pt briefly, per request. Pt gave application to CSW and inquired about contacts with [facility] and [facility]. He was informed that contact had not been made since pt and CSW last talked. He was assured that contact would be made later in the day. No other concerns expressed. Contact ended without incident.   CSW faxed information to [facility] for review.   CSW attempted contact with [facility] and [facility]. Contact was unable to be established and HIPPA compliant voicemails left with contact information for follow through.   CSW will continue to follow.   Chalmers Guest. Guerry Bruin, MSW, [place], [place] 03/26/2022 2:38 PM  CSW received voicemail from [facility] which informed that they do not take pt's from [place] area.   Chalmers Guest. Guerry Bruin, MSW, [place], [place] 03/26/2022 3:26 PM

## 2022-03-26 NOTE — Progress Notes (Signed)
Recreation Therapy Notes  Date: 03/26/2022   Time: 11:05 am     Location: Courtyard   Behavioral response: Appropriate  Intervention Topic:  Social Skills  Discussion/Intervention:  Group content on today was focused on social skills. The group defined social skills and identified ways they use social skills. Patients expressed what obstacles they face when trying to be social. Participants described the importance of social skills. The group listed ways to improve social skills and reasons to improve social skills. Individuals had an opportunity to learn new and improve social skills as well as identify their weaknesses. Clinical Observations/Feedback: Patient came to group and identified reasons socializing are important and who they normally socialize with. Individual was social with peers and staff while participating in the intervention.   Terrence Wishon LRT/CTRS        Lee Sparks 03/26/2022 12:37 PM

## 2022-03-26 NOTE — [facility] Counselor (Signed)
CSW contact [employer] regarding phone screening. Unable to establish contact and HIPPA compliant voicemail left with contact information for follow through.   CSW was contacted by Florentina Addison, Counsellor for [employer]. She apologized for the issues of the past few days and stated that unfortunately, pt has been through their program several times. Katie shared that pt had just left them less than half a year ago. CSW was advised to inform the pt that they recommend he complete another program prior to coming to their facility so that he has some clean time under his belt. CSW agreed. No other concerns expressed. Contact ended without incident.   CSW updated pt regarding the information that had been given to him by Florentina Addison. He voiced understanding and stated that he will be contacting other programs in the mean time. CSW encouraged pt to let CSW assist him with applications and contacts as necessary. He agreed. No other concerns expressed. Contact ended without incident.   CSW will continue to follow.   Vilma Meckel. [name], MSW, LCSW, LCAS 03/26/2022 10:05 AM

## 2022-03-26 NOTE — Progress Notes (Signed)
[facility] MD Progress Note  03/26/2022 4:11 PM Lee Sparks  MRN:  [id number] Subjective: Still anxious but about at his baseline.  No suicidal thoughts no new physical complaints Principal Problem: Major depressive disorder, recurrent severe without psychotic features (HCC) Diagnosis: Principal Problem:   Major depressive disorder, recurrent severe without psychotic features (HCC)  Total Time spent with patient: 30 minutes  Past Psychiatric History: Past history of anxiety and substance use disorder  Past Medical History:  Past Medical History:  Diagnosis Date   Alcohol-induced pancreatitis    Anemia    Asthma    Bronchitis     Past Surgical History:  Procedure Laterality Date   CYSTECTOMY     ESOPHAGOGASTRODUODENOSCOPY  07/28/2011   Procedure: ESOPHAGOGASTRODUODENOSCOPY (EGD);  Surgeon: Freddy Jaksch, MD;  Location: [facility] ENDOSCOPY;  Service: Endoscopy;  Laterality: N/A;   HERNIA REPAIR     Family History:  Family History  Problem Relation Age of Onset   Hypertension Father    Aneurysm Father    Cancer Other    Hypertension Mother    Pneumonia Mother    Family Psychiatric  History: See previous Social History:  Social History   Substance and Sexual Activity  Alcohol Use Yes   Alcohol/week: 50.0 standard drinks of alcohol   Types: 50 Cans of beer per week   Comment: Equivalent of two 40 oz beers daily.     Social History   Substance and Sexual Activity  Drug Use No    Social History   Socioeconomic History   Marital status: Single    Spouse name: Not on file   Number of children: Not on file   Years of education: Not on file   Highest education level: Not on file  Occupational History   Not on file  Tobacco Use   Smoking status: Every Day    Packs/day: 0.50    Years: 15.00    Total pack years: 7.50    Types: Cigarettes    Last attempt to quit: 06/27/2011    Years since quitting: 10.7   Smokeless tobacco: Never  Substance and Sexual Activity    Alcohol use: Yes    Alcohol/week: 50.0 standard drinks of alcohol    Types: 50 Cans of beer per week    Comment: Equivalent of two 40 oz beers daily.   Drug use: No   Sexual activity: Not Currently  Other Topics Concern   Not on file  Social History Narrative   Not on file   Social Determinants of Health   Financial Resource Strain: Not on file  Food Insecurity: Not on file  Transportation Needs: Not on file  Physical Activity: Not on file  Stress: Not on file  Social Connections: Not on file   Additional Social History:                         Sleep: Fair  Appetite:  Fair  Current Medications: Current Facility-Administered Medications  Medication Dose Route Frequency Provider Last Rate Last Admin   acetaminophen (TYLENOL) tablet 650 mg  650 mg Oral Q6H PRN Charm Rings, NP       alum & mag hydroxide-simeth (MAALOX/MYLANTA) 200-200-20 MG/5ML suspension 30 mL  30 mL Oral Q4H PRN Charm Rings, NP       amLODipine (NORVASC) tablet 5 mg  5 mg Oral Daily Sarina Ill, DO   5 mg at 03/26/22 0752   citalopram (CELEXA) tablet 40  mg  40 mg Oral Daily Adelee Hannula T, MD   40 mg at 03/26/22 2025   docusate sodium (COLACE) capsule 200 mg  200 mg Oral BID Eraina Winnie T, MD   200 mg at 03/26/22 4270   folic acid (FOLVITE) tablet 1 mg  1 mg Oral Daily Sarina Ill, DO   1 mg at 03/26/22 6237   hydrOXYzine (ATARAX) tablet 50 mg  50 mg Oral Q6H PRN Davian Wollenberg T, MD   50 mg at 03/26/22 1503   loratadine (CLARITIN) tablet 10 mg  10 mg Oral Daily Alwin Lanigan T, MD   10 mg at 03/26/22 6283   magnesium hydroxide (MILK OF MAGNESIA) suspension 30 mL  30 mL Oral Daily PRN Charm Rings, NP   30 mL at 03/26/22 0925   naltrexone (DEPADE) tablet 50 mg  50 mg Oral Daily Charm Rings, NP   50 mg at 03/26/22 1517   nicotine polacrilex (NICORETTE) gum 2 mg  2 mg Oral PRN Sarina Ill, DO   2 mg at 03/25/22 1634   QUEtiapine (SEROQUEL) tablet 200  mg  200 mg Oral QHS Courtne Lighty T, MD   200 mg at 03/25/22 2056   thiamine (VITAMIN B1) tablet 100 mg  100 mg Oral Daily Charm Rings, NP   100 mg at 03/26/22 6160   traZODone (DESYREL) tablet 100 mg  100 mg Oral QHS PRN Charm Rings, NP   100 mg at 03/20/22 2129    Lab Results: No results found for this or any previous visit (from the past 48 hour(s)).  Blood Alcohol level:  Lab Results  Component Value Date   ETH 91 (H) 03/13/2022   ETH 108 (H) 10/29/2021    Metabolic Disorder Labs: Lab Results  Component Value Date   HGBA1C 5.2 03/24/2022   MPG 102.54 03/24/2022   No results found for: "PROLACTIN" Lab Results  Component Value Date   CHOL 219 (H) 03/24/2022   TRIG 476 (H) 03/24/2022   HDL 38 (L) 03/24/2022   CHOLHDL 5.8 03/24/2022   VLDL UNABLE TO CALCULATE IF TRIGLYCERIDE OVER 400 mg/dL 73/71/0626   LDLCALC UNABLE TO CALCULATE IF TRIGLYCERIDE OVER 400 mg/dL 94/85/4627   LDLCALC 035 (H) 05/30/2020    Physical Findings: AIMS: Facial and Oral Movements Muscles of Facial Expression: None, normal Lips and Perioral Area: None, normal Jaw: None, normal Tongue: None, normal,Extremity Movements Upper (arms, wrists, hands, fingers): None, normal Lower (legs, knees, ankles, toes): None, normal, Trunk Movements Neck, shoulders, hips: None, normal, Overall Severity Severity of abnormal movements (highest score from questions above): None, normal Incapacitation due to abnormal movements: None, normal Patient's awareness of abnormal movements (rate only patient's report): No Awareness, Dental Status Current problems with teeth and/or dentures?: No Does patient usually wear dentures?: No  CIWA:  CIWA-Ar Total: 2 COWS:     Musculoskeletal: Strength & Muscle Tone: within normal limits Gait & Station: normal Patient leans: N/A  Psychiatric Specialty Exam:  Presentation  General Appearance: Appropriate for Environment  Eye Contact:Good  Speech:Clear and  Coherent  Speech Volume:Normal  Handedness:Right   Mood and Affect  Mood:Depressed  Affect:Congruent   Thought Process  Thought Processes:Coherent  Descriptions of Associations:Intact  Orientation:Full (Time, Place and Person)  Thought Content:Perseveration  History of Schizophrenia/Schizoaffective disorder:No  Duration of Psychotic Symptoms:No data recorded Hallucinations:No data recorded Ideas of Reference:None  Suicidal Thoughts:No data recorded Homicidal Thoughts:No data recorded  Sensorium  Memory:Immediate Good  Judgment:Poor  Insight:Poor  Executive Functions  Concentration:Good  Attention Span:Good  Recall:Good  Progress Energy of Knowledge:Fair  Language:Fair   Psychomotor Activity  Psychomotor Activity:No data recorded  Assets  Assets:Resilience; Physical Health; Social Support   Sleep  Sleep:No data recorded   Physical Exam: Physical Exam Vitals and nursing note reviewed.  Constitutional:      Appearance: Normal appearance.  HENT:     Head: Normocephalic and atraumatic.     Mouth/Throat:     Pharynx: Oropharynx is clear.  Eyes:     Pupils: Pupils are equal, round, and reactive to light.  Cardiovascular:     Rate and Rhythm: Normal rate and regular rhythm.  Pulmonary:     Effort: Pulmonary effort is normal.     Breath sounds: Normal breath sounds.  Abdominal:     General: Abdomen is flat.     Palpations: Abdomen is soft.  Musculoskeletal:        General: Normal range of motion.  Skin:    General: Skin is warm and dry.  Neurological:     General: No focal deficit present.     Mental Status: He is alert. Mental status is at baseline.  Psychiatric:        Attention and Perception: Attention normal.        Mood and Affect: Mood is anxious.        Speech: Speech normal.        Behavior: Behavior normal.        Thought Content: Thought content normal.        Cognition and Memory: Cognition normal.        Judgment: Judgment  normal.    Review of Systems  Constitutional: Negative.   HENT: Negative.    Eyes: Negative.   Respiratory: Negative.    Cardiovascular: Negative.   Gastrointestinal: Negative.   Musculoskeletal: Negative.   Skin: Negative.   Neurological: Negative.   Psychiatric/Behavioral:  The patient is nervous/anxious.    Blood pressure 118/82, pulse 78, temperature 98.3 F (36.8 C), temperature source Oral, resp. rate 18, height 5' 8.5" (1.74 m), weight 88.9 kg, SpO2 97 %. Body mass index is 29.36 kg/m.   Treatment Plan Summary: Plan no change to medicine.  He is looking for some kind of disposition plan of which there is none now.  Social work is following up.  Mordecai Rasmussen, MD 03/26/2022, 4:11 PM

## 2022-03-27 NOTE — Plan of Care (Signed)
°  Problem: Group Participation °Goal: STG - Patient will engage in groups without prompting or encouragement from LRT x3 group sessions within 5 recreation therapy group sessions °Description: STG - Patient will engage in groups without prompting or encouragement from LRT x3 group sessions within 5 recreation therapy group sessions °Outcome: Progressing °  °

## 2022-03-27 NOTE — Group Note (Signed)
[facility] LCSW Group Therapy Note   Group Date: 03/27/2022 Start Time: 1310 End Time: 1315   Type of Therapy/Topic:  Group Therapy:  Balance in Life  Participation Level:  Did Not Attend   Description of Group:    This group will address the concept of balance and how it feels and looks when one is unbalanced. Patients will be encouraged to process areas in their lives that are out of balance, and identify reasons for remaining unbalanced. Facilitators will guide patients utilizing problem- solving interventions to address and correct the stressor making their life unbalanced. Understanding and applying boundaries will be explored and addressed for obtaining  and maintaining a balanced life. Patients will be encouraged to explore ways to assertively make their unbalanced needs known to significant others in their lives, using other group members and facilitator for support and feedback.  Therapeutic Goals: Patient will identify two or more emotions or situations they have that consume much of in their lives. Patient will identify signs/triggers that life has become out of balance:  Patient will identify two ways to set boundaries in order to achieve balance in their lives:  Patient will demonstrate ability to communicate their needs through discussion and/or role plays  Summary of Patient Progress:    Group unable to be held.  CSW provided patient with worksheets.  CSW assessed for any needs and encouraged patient to follow up if additional needs are identified.     Therapeutic Modalities:   Cognitive Behavioral Therapy Solution-Focused Therapy Assertiveness Training   Hubbard Seldon J Ryonna Cimini, LCSW 

## 2022-03-27 NOTE — Progress Notes (Signed)
Recreation Therapy Notes  Date: 03/27/2022   Time: 10:50 am    Location: Craft room      Behavioral response: N/A   Intervention Topic: Decision Making     Discussion/Intervention: Patient refused to attend group.    Clinical Observations/Feedback:  Patient refused to attend group.    Colton Engdahl LRT/CTRS          Susen Haskew 03/27/2022 11:36 AM 

## 2022-03-27 NOTE — BH IP Treatment Plan (Signed)
Interdisciplinary Treatment and Diagnostic Plan Update  03/27/2022 Time of Session: 10:30AM SHAWNN BOUILLON MRN: [id number]  Principal Diagnosis: Major depressive disorder, recurrent severe without psychotic features (HCC)  Secondary Diagnoses: Principal Problem:   Major depressive disorder, recurrent severe without psychotic features (HCC)   Current Medications:  Current Facility-Administered Medications  Medication Dose Route Frequency Provider Last Rate Last Admin   acetaminophen (TYLENOL) tablet 650 mg  650 mg Oral Q6H PRN Charm Rings, NP       alum & mag hydroxide-simeth (MAALOX/MYLANTA) 200-200-20 MG/5ML suspension 30 mL  30 mL Oral Q4H PRN Charm Rings, NP       amLODipine (NORVASC) tablet 5 mg  5 mg Oral Daily Sarina Ill, DO   5 mg at 03/27/22 0755   citalopram (CELEXA) tablet 40 mg  40 mg Oral Daily [name], [name], MD   40 mg at 03/27/22 0754   docusate sodium (COLACE) capsule 200 mg  200 mg Oral BID [name], [name], MD   200 mg at 03/27/22 0755   folic acid (FOLVITE) tablet 1 mg  1 mg Oral Daily Sarina Ill, DO   1 mg at 03/27/22 0755   hydrOXYzine (ATARAX) tablet 50 mg  50 mg Oral Q6H PRN [name], [name], MD   50 mg at 03/27/22 0754   loratadine (CLARITIN) tablet 10 mg  10 mg Oral Daily [name], [name], MD   10 mg at 03/27/22 0755   magnesium hydroxide (MILK OF MAGNESIA) suspension 30 mL  30 mL Oral Daily PRN Charm Rings, NP   30 mL at 03/26/22 0925   naltrexone (DEPADE) tablet 50 mg  50 mg Oral Daily Charm Rings, NP   50 mg at 03/27/22 0827   nicotine polacrilex (NICORETTE) gum 2 mg  2 mg Oral PRN Sarina Ill, DO   2 mg at 03/27/22 0755   QUEtiapine (SEROQUEL) tablet 200 mg  200 mg Oral QHS [name], [name], MD   200 mg at 03/26/22 2144   senna-docusate (Senokot-S) tablet 2 tablet  2 tablet Oral Daily PRN [name], [name], MD   2 tablet at 03/26/22 1724   thiamine (VITAMIN B1) tablet 100 mg  100 mg Oral Daily Charm Rings,  NP   100 mg at 03/27/22 0755   traZODone (DESYREL) tablet 100 mg  100 mg Oral QHS PRN Charm Rings, NP   100 mg at 03/20/22 2129   PTA Medications: Medications Prior to Admission  Medication Sig Dispense Refill Last Dose   amLODipine (NORVASC) 5 MG tablet Take 1 tablet (5 mg total) by mouth daily. (Patient not taking: Reported on 03/13/2022) 30 tablet 1    aspirin 81 MG EC tablet Take 1 tablet (81 mg total) by mouth daily. Swallow whole. (Patient not taking: Reported on 03/13/2022) 30 tablet 1    buPROPion (WELLBUTRIN XL) 150 MG 24 hr tablet Take 1 tablet (150 mg total) by mouth daily. (Patient not taking: Reported on 03/13/2022) 30 tablet 1    FLUoxetine (PROZAC) 20 MG capsule Take 1 capsule (20 mg total) by mouth daily. (Patient not taking: Reported on 03/13/2022) 30 capsule 1    fluticasone (FLONASE) 50 MCG/ACT nasal spray Place 2 sprays into both nostrils daily. (Patient not taking: Reported on 03/13/2022) 16 g 1    hydrOXYzine (ATARAX) 50 MG tablet Take 1 tablet (50 mg total) by mouth every 6 (six) hours as needed for anxiety (sleep). (Patient not taking: Reported on 03/13/2022)  60 tablet 1    ibuprofen (ADVIL) 600 MG tablet Take 1 tablet (600 mg total) by mouth every 6 (six) hours as needed for mild pain, moderate pain or headache. (Patient not taking: Reported on 03/13/2022) 60 tablet 1    loratadine (CLARITIN) 10 MG tablet Take 1 tablet (10 mg total) by mouth daily. (Patient not taking: Reported on 03/13/2022) 30 tablet 1    nicotine polacrilex (NICORETTE) 2 MG gum Take 1 each (2 mg total) by mouth as needed for smoking cessation. (Patient not taking: Reported on 03/13/2022) 110 each 0    pantoprazole (PROTONIX) 20 MG tablet Take 1 tablet (20 mg total) by mouth daily. (Patient not taking: Reported on 03/13/2022) 30 tablet 1    QUEtiapine (SEROQUEL) 100 MG tablet Take 1 tablet (100 mg total) by mouth at bedtime as needed (sleep). 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   Physician Treatment Plan for Primary Diagnosis: Major depressive disorder, recurrent severe without psychotic features (HCC) Long Term Goal(s): Improvement in symptoms so as ready for discharge   Short Term Goals: Ability to identify changes in lifestyle to reduce recurrence of condition will improve Ability to verbalize feelings will improve Ability to disclose and discuss suicidal ideas Ability to demonstrate self-control will improve Ability to identify and develop effective coping behaviors will improve Ability to maintain clinical measurements within normal limits will improve Compliance with prescribed medications will improve Ability to identify triggers associated with substance abuse/mental health issues will improve  Medication Management: Evaluate patient's response, side effects, and tolerance of medication regimen.  Therapeutic Interventions: 1 to 1 sessions, Unit Group sessions and Medication administration.  Evaluation of Outcomes: Progressing  Physician Treatment Plan for Secondary Diagnosis: Principal Problem:   Major depressive disorder, recurrent severe without psychotic features (HCC)  Long Term Goal(s): Improvement in symptoms so as ready for discharge   Short Term Goals: Ability to identify changes in lifestyle to reduce recurrence of condition will improve Ability to verbalize feelings will improve Ability to disclose and discuss suicidal ideas Ability to demonstrate self-control will improve Ability to identify and develop effective coping behaviors will improve Ability to  maintain clinical measurements within normal limits will improve Compliance with prescribed medications will improve Ability to identify triggers associated with substance abuse/mental health issues will improve     Medication Management: Evaluate patient's response, side effects, and tolerance of medication regimen.  Therapeutic Interventions: 1 to 1 sessions, Unit Group sessions and Medication administration.  Evaluation of Outcomes: Progressing   RN Treatment Plan for Primary Diagnosis: Major depressive disorder, recurrent severe without psychotic features (HCC) Long Term Goal(s): Knowledge of disease and therapeutic regimen to maintain health will improve  Short Term Goals: Ability to demonstrate self-control, Ability to participate in decision making will improve, Ability to verbalize feelings will improve, Ability to disclose and discuss suicidal ideas, Ability to identify and develop effective coping behaviors will improve, and Compliance with prescribed medications will improve  Medication Management: RN will administer medications as ordered by provider, will assess and evaluate patient's response and provide education to patient for prescribed medication. RN will report any  adverse and/or side effects to prescribing provider.  Therapeutic Interventions: 1 on 1 counseling sessions, Psychoeducation, Medication administration, Evaluate responses to treatment, Monitor vital signs and CBGs as ordered, Perform/monitor CIWA, COWS, AIMS and Fall Risk screenings as ordered, Perform wound care treatments as ordered.  Evaluation of Outcomes: Progressing   LCSW Treatment Plan for Primary Diagnosis: Major depressive disorder, recurrent severe without psychotic features (HCC) Long Term Goal(s): Safe transition to appropriate next level of care at discharge, Engage patient in therapeutic group addressing interpersonal concerns.  Short Term Goals: Engage patient in aftercare planning with  referrals and resources, Increase social support, Increase ability to appropriately verbalize feelings, Increase emotional regulation, Facilitate acceptance of mental health diagnosis and concerns, and Increase skills for wellness and recovery  Therapeutic Interventions: Assess for all discharge needs, 1 to 1 time with Social worker, Explore available resources and support systems, Assess for adequacy in community support network, Educate family and significant other(s) on suicide prevention, Complete Psychosocial Assessment, Interpersonal group therapy.  Evaluation of Outcomes: Progressing   Progress in Treatment: Attending groups: Yes. Participating in groups: Yes. Taking medication as prescribed: Yes. Toleration medication: Yes. Family/Significant other contact made: No, will contact:  once permission is given Patient understands diagnosis: Yes. Discussing patient identified problems/goals with staff: Yes. Medical problems stabilized or resolved: Yes. Denies suicidal/homicidal ideation: Yes. Issues/concerns per patient self-inventory: No. Other: none  New problem(s) identified: No, Describe:  none identified. 03/24/22 Update: No changes at this time.  Update 03/27/2022:  No changes at this time.   New Short Term/Long Term Goal(s):  medication management for mood stabilization; elimination of SI thoughts; development of comprehensive mental wellness/sobriety plan. 03/24/22 Update: No changes at this time.  Update 03/27/2022:  No changes at this time.   Patient Goals:  "Trying get medicine right so I won't be going through these spells again." 03/24/22 Update: No changes at this time.  Update 03/27/2022:  No changes at this time.   Discharge Plan or Barriers: CSW will assist pt with development of an appropriate aftercare/discharge plan. 03/24/22 Update: Applications/referrals have been submitted to [facility], [facility], and [facility]. [facility] will not be able to take anyone for six weeks. Contacts  have been made with [facility] regarding a screening that was scheduled for Friday, 03/21/22 at 9am which did not happen as intake coordinator could not be reached. CSW to follow up on [facility]. Update 03/27/2022:  [facility] has denied patient at this time due to having been through their program several times, most recently less than 6 months ago.  They recommended that the patient complete another program and have some clean time before reapplying.  Patient was also denied at [facility] due to the facility not being within the [place] catchment area.  Application has been sent to [facility] and [facility], CSW awaiting return calls.     Reason for Continuation of Hospitalization: Anxiety Depression Medication stabilization Suicidal ideation Withdrawal symptoms   Estimated Length of Stay:  1-7 days 03/24/22 Update: TBD  Last 3 [place] Suicide Severity Risk Score: Flowsheet Row Admission (Current) from 03/15/2022 in [facility] INPATIENT BEHAVIORAL MEDICINE ED from 03/13/2022 in [facility] Admission (Discharged) from 10/29/2021 in [facility]PAT[facility] MEDICINE  C-SSRS RISK CATEGORY No Risk High Risk Moderate Risk       Last PHQ 2/9 Scores:    05/10/2020    3:58 PM  Depression screen PHQ 2/9  Decreased Interest 0  Down, Depressed, Hopeless 1  PHQ - 2 Score 1  Altered sleeping 1  Tired, decreased energy 0  Change in appetite 0  Feeling bad or failure about yourself  0  Trouble concentrating 1  Moving slowly or fidgety/restless 0  Suicidal thoughts 0  PHQ-9 Score 3  Difficult doing work/chores Somewhat difficult    Scribe for Treatment Team: Harden Mo, Alexander Mt 03/27/2022 11:33 AM

## 2022-03-27 NOTE — Plan of Care (Signed)
D- Patient alert and oriented. Patient presented in a pleasant mood on assessment stating that he didn't sleep at all last night because of a disruptive patient on the unit. Patient endorsed hopelessness, depression, and anxiety, but did not go into detail as to why he's feeling this way. Patient denied SI, HI, AVH, and pain at this time. Patient's goal for today is "getting into a rehab", in which he will "talk to counselor", in order to achieve his goal.  A- Scheduled medications administered to patient, per MD orders. Support and encouragement provided.  Routine safety checks conducted every 15 minutes.  Patient informed to notify staff with problems or concerns.  R- No adverse drug reactions noted. Patient contracts for safety at this time. Patient compliant with medications and treatment plan. Patient receptive, calm, and cooperative. Patient interacts well with others on the unit. Patient remains safe at this time.  Problem: Education: Goal: Knowledge of General Education information will improve Description: Including pain rating scale, medication(s)/side effects and non-pharmacologic comfort measures Outcome: Progressing   Problem: Health Behavior/Discharge Planning: Goal: Ability to manage health-related needs will improve Outcome: Progressing   Problem: Clinical Measurements: Goal: Ability to maintain clinical measurements within normal limits will improve Outcome: Progressing Goal: Will remain free from infection Outcome: Progressing Goal: Diagnostic test results will improve Outcome: Progressing Goal: Respiratory complications will improve Outcome: Progressing Goal: Cardiovascular complication will be avoided Outcome: Progressing   Problem: Activity: Goal: Risk for activity intolerance will decrease Outcome: Progressing   Problem: Nutrition: Goal: Adequate nutrition will be maintained Outcome: Progressing   Problem: Coping: Goal: Level of anxiety will  decrease Outcome: Progressing   Problem: Elimination: Goal: Will not experience complications related to bowel motility Outcome: Progressing Goal: Will not experience complications related to urinary retention Outcome: Progressing   Problem: Pain Managment: Goal: General experience of comfort will improve Outcome: Progressing   Problem: Safety: Goal: Ability to remain free from injury will improve Outcome: Progressing   Problem: Skin Integrity: Goal: Risk for impaired skin integrity will decrease Outcome: Progressing   Problem: Education: Goal: Knowledge of [place] General Education information/materials will improve Outcome: Progressing Goal: Emotional status will improve Outcome: Progressing Goal: Mental status will improve Outcome: Progressing Goal: Verbalization of understanding the information provided will improve Outcome: Progressing   Problem: Activity: Goal: Interest or engagement in activities will improve Outcome: Progressing Goal: Sleeping patterns will improve Outcome: Progressing   Problem: Coping: Goal: Ability to verbalize frustrations and anger appropriately will improve Outcome: Progressing Goal: Ability to demonstrate self-control will improve Outcome: Progressing   Problem: Health Behavior/Discharge Planning: Goal: Identification of resources available to assist in meeting health care needs will improve Outcome: Progressing Goal: Compliance with treatment plan for underlying cause of condition will improve Outcome: Progressing   Problem: Physical Regulation: Goal: Ability to maintain clinical measurements within normal limits will improve Outcome: Progressing   Problem: Safety: Goal: Periods of time without injury will increase Outcome: Progressing   Problem: Education: Goal: Ability to make informed decisions regarding treatment will improve Outcome: Progressing   Problem: Medication: Goal: Compliance with prescribed medication  regimen will improve Outcome: Progressing   Problem: Education: Goal: Utilization of techniques to improve thought processes will improve Outcome: Progressing Goal: Knowledge of the prescribed therapeutic regimen will improve Outcome: Progressing   Problem: Activity: Goal: Interest or engagement in leisure activities will improve Outcome: Progressing Goal: Imbalance in normal sleep/wake cycle will improve Outcome: Progressing   Problem: Coping: Goal: Coping ability will improve Outcome: Progressing Goal:  Will verbalize feelings Outcome: Progressing   Problem: Health Behavior/Discharge Planning: Goal: Ability to make decisions will improve Outcome: Progressing Goal: Compliance with therapeutic regimen will improve Outcome: Progressing   Problem: Role Relationship: Goal: Will demonstrate positive changes in social behaviors and relationships Outcome: Progressing   Problem: Safety: Goal: Ability to disclose and discuss suicidal ideas will improve Outcome: Progressing Goal: Ability to identify and utilize support systems that promote safety will improve Outcome: Progressing   Problem: Self-Concept: Goal: Will verbalize positive feelings about self Outcome: Progressing Goal: Level of anxiety will decrease Outcome: Progressing

## 2022-03-27 NOTE — Progress Notes (Signed)
Patient pleasant and cooperative. Appropriate with staff and peers. Denies SI, HI, aVH. Prns given for anxiety and sleep with good relief. Denies SI, HI, AVH.  Encouragement and support provided. Safety checks maintained. Medications given as prescribed. Pt receptive and remains safe on unit with q 15 min checks.

## 2022-03-27 NOTE — Plan of Care (Signed)
  Problem: Safety: Goal: Ability to remain free from injury will improve Outcome: Progressing   

## 2022-03-28 DIAGNOSIS — F332 Major depressive disorder, recurrent severe without psychotic features: Secondary | ICD-10-CM | POA: Diagnosis not present

## 2022-03-28 NOTE — [facility] Group Notes (Signed)
[facility] Group Notes:  (Nursing/MHT/Case Management/Adjunct)  Date:  03/28/2022  Time:  9:48 AM  Type of Therapy:   community meeting  Participation Level:  Did Not Attend    Lee Sparks 03/28/2022, 9:48 AM

## 2022-03-28 NOTE — [facility] Counselor (Addendum)
CSW met with pt briefly. Pt gave CSW application to send to [facility] for potential admission. CSW agreed. Pt asked for an update regarding any other placements. CSW was unable to give any as there were none but informed pt that they would be contacted and he would be updated. No other concerns expressed. Contact ended without incident.   CSW received call from Joe of [facility] coordinator. CSW was informed that pt had been declined as they feel he needs a higher level of care than they can offer. No other concerns expressed. Contact ended without incident.   CSW contacted [facility]. CSW was informed that they are still not open yet as the states was still inspecting them. No other concerns expressed. Contact ended without incident.   CSW contacted [facility] to get fax number. CSW received number. No other concerns expressed. Contact ended without incident.   CSW updated pt regarding contacts and information that had been given. Pt shares that he does not understand all of this but hopes that [facility] works out for him. No other concerns expressed. Contact ended without incident.   Application for [facility] was faxed ([phone]).   R. , MSW, LCSW, LCAS 03/28/2022 4:59 PM  

## 2022-03-28 NOTE — Progress Notes (Signed)
[facility] MD Progress Note  03/28/2022 10:24 AM Lee Sparks  MRN:  [id number] Subjective: Patient seen for follow-up.  51 year old man with depression and anxiety.  Continues to endorse anxiety.  No active suicidal ideation.  Affect a little more appropriate and reactive.  He seems to be making more of an effort to find appropriate discharge planning for himself.  No new physical complaints Principal Problem: Major depressive disorder, recurrent severe without psychotic features (HCC) Diagnosis: Principal Problem:   Major depressive disorder, recurrent severe without psychotic features (HCC)  Total Time spent with patient: 30 minutes  Past Psychiatric History: Past history of depression and anxiety and substance abuse  Past Medical History:  Past Medical History:  Diagnosis Date   Alcohol-induced pancreatitis    Anemia    Asthma    Bronchitis     Past Surgical History:  Procedure Laterality Date   CYSTECTOMY     ESOPHAGOGASTRODUODENOSCOPY  07/28/2011   Procedure: ESOPHAGOGASTRODUODENOSCOPY (EGD);  Surgeon: Freddy Jaksch, MD;  Location: [facility] ENDOSCOPY;  Service: Endoscopy;  Laterality: N/A;   HERNIA REPAIR     Family History:  Family History  Problem Relation Age of Onset   Hypertension Father    Aneurysm Father    Cancer Other    Hypertension Mother    Pneumonia Mother    Family Psychiatric  History: See previous Social History:  Social History   Substance and Sexual Activity  Alcohol Use Yes   Alcohol/week: 50.0 standard drinks of alcohol   Types: 50 Cans of beer per week   Comment: Equivalent of two 40 oz beers daily.     Social History   Substance and Sexual Activity  Drug Use No    Social History   Socioeconomic History   Marital status: Single    Spouse name: Not on file   Number of children: Not on file   Years of education: Not on file   Highest education level: Not on file  Occupational History   Not on file  Tobacco Use   Smoking status: Every Day     Packs/day: 0.50    Years: 15.00    Total pack years: 7.50    Types: Cigarettes    Last attempt to quit: 06/27/2011    Years since quitting: 10.7   Smokeless tobacco: Never  Substance and Sexual Activity   Alcohol use: Yes    Alcohol/week: 50.0 standard drinks of alcohol    Types: 50 Cans of beer per week    Comment: Equivalent of two 40 oz beers daily.   Drug use: No   Sexual activity: Not Currently  Other Topics Concern   Not on file  Social History Narrative   Not on file   Social Determinants of Health   Financial Resource Strain: Not on file  Food Insecurity: Not on file  Transportation Needs: Not on file  Physical Activity: Not on file  Stress: Not on file  Social Connections: Not on file   Additional Social History:                         Sleep: Fair  Appetite:  Fair  Current Medications: Current Facility-Administered Medications  Medication Dose Route Frequency Provider Last Rate Last Admin   acetaminophen (TYLENOL) tablet 650 mg  650 mg Oral Q6H PRN Charm Rings, NP       alum & mag hydroxide-simeth (MAALOX/MYLANTA) 200-200-20 MG/5ML suspension 30 mL  30 mL Oral Q4H PRN  Charm Rings, NP       amLODipine (NORVASC) tablet 5 mg  5 mg Oral Daily Sarina Ill, DO   5 mg at 03/28/22 4742   citalopram (CELEXA) tablet 40 mg  40 mg Oral Daily Atasha Colebank, [name], MD   40 mg at 03/28/22 5956   docusate sodium (COLACE) capsule 200 mg  200 mg Oral BID Quantia Grullon, [name], MD   200 mg at 03/28/22 3875   folic acid (FOLVITE) tablet 1 mg  1 mg Oral Daily Sarina Ill, DO   1 mg at 03/28/22 6433   hydrOXYzine (ATARAX) tablet 50 mg  50 mg Oral Q6H PRN Chrisann Melaragno, [name], MD   50 mg at 03/28/22 2951   loratadine (CLARITIN) tablet 10 mg  10 mg Oral Daily Sakai Heinle, [name], MD   10 mg at 03/28/22 8841   magnesium hydroxide (MILK OF MAGNESIA) suspension 30 mL  30 mL Oral Daily PRN Charm Rings, NP   30 mL at 03/26/22 6606   naltrexone (DEPADE) tablet  50 mg  50 mg Oral Daily Charm Rings, NP   50 mg at 03/28/22 3016   nicotine polacrilex (NICORETTE) gum 2 mg  2 mg Oral PRN Sarina Ill, DO   2 mg at 03/28/22 0915   QUEtiapine (SEROQUEL) tablet 200 mg  200 mg Oral QHS Jazmina Muhlenkamp T, MD   200 mg at 03/27/22 2102   senna-docusate (Senokot-S) tablet 2 tablet  2 tablet Oral Daily PRN Ender Rorke, [name], MD   2 tablet at 03/26/22 1724   thiamine (VITAMIN B1) tablet 100 mg  100 mg Oral Daily Charm Rings, NP   100 mg at 03/28/22 0109   traZODone (DESYREL) tablet 100 mg  100 mg Oral QHS PRN Charm Rings, NP   100 mg at 03/20/22 2129    Lab Results: No results found for this or any previous visit (from the past 48 hour(s)).  Blood Alcohol level:  Lab Results  Component Value Date   ETH 91 (H) 03/13/2022   ETH 108 (H) 10/29/2021    Metabolic Disorder Labs: Lab Results  Component Value Date   HGBA1C 5.2 03/24/2022   MPG 102.54 03/24/2022   No results found for: "PROLACTIN" Lab Results  Component Value Date   CHOL 219 (H) 03/24/2022   TRIG 476 (H) 03/24/2022   HDL 38 (L) 03/24/2022   CHOLHDL 5.8 03/24/2022   VLDL UNABLE TO CALCULATE IF TRIGLYCERIDE OVER 400 mg/dL 32/35/5732   LDLCALC UNABLE TO CALCULATE IF TRIGLYCERIDE OVER 400 mg/dL 20/25/4270   LDLCALC 623 (H) 05/30/2020    Physical Findings: AIMS: Facial and Oral Movements Muscles of Facial Expression: None, normal Lips and Perioral Area: None, normal Jaw: None, normal Tongue: None, normal,Extremity Movements Upper (arms, wrists, hands, fingers): None, normal Lower (legs, knees, ankles, toes): None, normal, Trunk Movements Neck, shoulders, hips: None, normal, Overall Severity Severity of abnormal movements (highest score from questions above): None, normal Incapacitation due to abnormal movements: None, normal Patient's awareness of abnormal movements (rate only patient's report): No Awareness, Dental Status Current problems with teeth and/or dentures?:  No Does patient usually wear dentures?: No  CIWA:  CIWA-Ar Total: 2 COWS:     Musculoskeletal: Strength & Muscle Tone: within normal limits Gait & Station: normal Patient leans: N/A  Psychiatric Specialty Exam:  Presentation  General Appearance: Appropriate for Environment  Eye Contact:Good  Speech:Clear and Coherent  Speech Volume:Normal  Handedness:Right   Mood and  Affect  Mood:Depressed  Affect:Congruent   Thought Process  Thought Processes:Coherent  Descriptions of Associations:Intact  Orientation:Full (Time, Place and Person)  Thought Content:Perseveration  History of Schizophrenia/Schizoaffective disorder:No  Duration of Psychotic Symptoms:No data recorded Hallucinations:No data recorded Ideas of Reference:None  Suicidal Thoughts:No data recorded Homicidal Thoughts:No data recorded  Sensorium  Memory:Immediate Good  Judgment:Poor  Insight:Poor   Executive Functions  Concentration:Good  Attention Span:Good  Recall:Good  Fund of Knowledge:Fair  Language:Fair   Psychomotor Activity  Psychomotor Activity:No data recorded  Assets  Assets:Resilience; Physical Health; Social Support   Sleep  Sleep:No data recorded   Physical Exam: Physical Exam Vitals and nursing note reviewed.  Constitutional:      Appearance: Normal appearance.  HENT:     Head: Normocephalic and atraumatic.     Mouth/Throat:     Pharynx: Oropharynx is clear.  Eyes:     Pupils: Pupils are equal, round, and reactive to light.  Cardiovascular:     Rate and Rhythm: Normal rate and regular rhythm.  Pulmonary:     Effort: Pulmonary effort is normal.     Breath sounds: Normal breath sounds.  Abdominal:     General: Abdomen is flat.     Palpations: Abdomen is soft.  Musculoskeletal:        General: Normal range of motion.  Skin:    General: Skin is warm and dry.  Neurological:     General: No focal deficit present.     Mental Status: He is alert. Mental  status is at baseline.  Psychiatric:        Attention and Perception: Attention normal.        Mood and Affect: Mood is anxious.        Speech: Speech normal.        Behavior: Behavior normal.        Thought Content: Thought content normal.        Cognition and Memory: Cognition normal.    Review of Systems  Constitutional: Negative.   HENT: Negative.    Eyes: Negative.   Respiratory: Negative.    Cardiovascular: Negative.   Gastrointestinal: Negative.   Musculoskeletal: Negative.   Skin: Negative.   Neurological: Negative.   Psychiatric/Behavioral:  Positive for depression. Negative for suicidal ideas. The patient is nervous/anxious.    Blood pressure 116/78, pulse 92, temperature 98.5 F (36.9 C), temperature source Oral, resp. rate 18, height 5' 8.5" (1.74 m), weight 88.9 kg, SpO2 97 %. Body mass index is 29.36 kg/m.   Treatment Plan Summary: Medication management and Plan no indication to change medication management.  Reviewed with patient the need for him to be working on finding appropriate discharge where he can be safe and continue treatment.  Continue involvement in groups and activities on the unit.  Mordecai Rasmussen, MD 03/28/2022, 10:24 AM

## 2022-03-28 NOTE — Plan of Care (Signed)
Patient continues to have anxiety to get in the rehab program. Patient states that Vistaril helping him. Denies SI,HI and AVH. Appropriate with staff & peers. Appetite and energy level good. Attended groups. Support and encouragement given.

## 2022-03-28 NOTE — [facility] Group Notes (Signed)
[facility] Group Notes:  (Nursing/MHT/Case Management/Adjunct)  Date:  03/28/2022  Time:  8:58 PM  Type of Therapy:   Wrap up  Participation Level:  Active  Participation Quality:  Appropriate  Affect:  Appropriate  Cognitive:  Alert  Insight:  Good  Engagement in Group:  Engaged  Modes of Intervention:   Want to go to rehab.  Summary of Progress/Problems:  Lee Sparks 03/28/2022, 8:58 PM

## 2022-03-28 NOTE — Progress Notes (Signed)
[facility] MD Progress Note  03/27/2022 10:26 AM Lee Sparks  MRN:  [id number] Subjective: Patient was seen today.  He has no new complaints.  Continues to have the anxiety but no active suicidal ideation.  Physical fatigue but getting over it.  Sleeping better. Principal Problem: Major depressive disorder, recurrent severe without psychotic features (HCC) Diagnosis: Principal Problem:   Major depressive disorder, recurrent severe without psychotic features (HCC)  Total Time spent with patient: 15 minutes  Past Psychiatric History: Past history of longstanding depression and anxiety with substance use disorder  Past Medical History:  Past Medical History:  Diagnosis Date   Alcohol-induced pancreatitis    Anemia    Asthma    Bronchitis     Past Surgical History:  Procedure Laterality Date   CYSTECTOMY     ESOPHAGOGASTRODUODENOSCOPY  07/28/2011   Procedure: ESOPHAGOGASTRODUODENOSCOPY (EGD);  Surgeon: Freddy Jaksch, MD;  Location: [facility] ENDOSCOPY;  Service: Endoscopy;  Laterality: N/A;   HERNIA REPAIR     Family History:  Family History  Problem Relation Age of Onset   Hypertension Father    Aneurysm Father    Cancer Other    Hypertension Mother    Pneumonia Mother    Family Psychiatric  History: See previous Social History:  Social History   Substance and Sexual Activity  Alcohol Use Yes   Alcohol/week: 50.0 standard drinks of alcohol   Types: 50 Cans of beer per week   Comment: Equivalent of two 40 oz beers daily.     Social History   Substance and Sexual Activity  Drug Use No    Social History   Socioeconomic History   Marital status: Single    Spouse name: Not on file   Number of children: Not on file   Years of education: Not on file   Highest education level: Not on file  Occupational History   Not on file  Tobacco Use   Smoking status: Every Day    Packs/day: 0.50    Years: 15.00    Total pack years: 7.50    Types: Cigarettes    Last attempt to  quit: 06/27/2011    Years since quitting: 10.7   Smokeless tobacco: Never  Substance and Sexual Activity   Alcohol use: Yes    Alcohol/week: 50.0 standard drinks of alcohol    Types: 50 Cans of beer per week    Comment: Equivalent of two 40 oz beers daily.   Drug use: No   Sexual activity: Not Currently  Other Topics Concern   Not on file  Social History Narrative   Not on file   Social Determinants of Health   Financial Resource Strain: Not on file  Food Insecurity: Not on file  Transportation Needs: Not on file  Physical Activity: Not on file  Stress: Not on file  Social Connections: Not on file   Additional Social History:                         Sleep: Fair  Appetite:  Fair  Current Medications: Current Facility-Administered Medications  Medication Dose Route Frequency Provider Last Rate Last Admin   acetaminophen (TYLENOL) tablet 650 mg  650 mg Oral Q6H PRN Charm Rings, NP       alum & mag hydroxide-simeth (MAALOX/MYLANTA) 200-200-20 MG/5ML suspension 30 mL  30 mL Oral Q4H PRN Charm Rings, NP       amLODipine (NORVASC) tablet 5 mg  5 mg  Oral Daily Sarina Ill, DO   5 mg at 03/28/22 4268   citalopram (CELEXA) tablet 40 mg  40 mg Oral Daily Skyy Mcknight, [name], MD   40 mg at 03/28/22 3419   docusate sodium (COLACE) capsule 200 mg  200 mg Oral BID Casondra Gasca, [name], MD   200 mg at 03/28/22 6222   folic acid (FOLVITE) tablet 1 mg  1 mg Oral Daily Sarina Ill, DO   1 mg at 03/28/22 9798   hydrOXYzine (ATARAX) tablet 50 mg  50 mg Oral Q6H PRN Joren Rehm, [name], MD   50 mg at 03/28/22 9211   loratadine (CLARITIN) tablet 10 mg  10 mg Oral Daily Geraldo Haris, [name], MD   10 mg at 03/28/22 9417   magnesium hydroxide (MILK OF MAGNESIA) suspension 30 mL  30 mL Oral Daily PRN Charm Rings, NP   30 mL at 03/26/22 4081   naltrexone (DEPADE) tablet 50 mg  50 mg Oral Daily Charm Rings, NP   50 mg at 03/28/22 4481   nicotine polacrilex (NICORETTE)  gum 2 mg  2 mg Oral PRN Sarina Ill, DO   2 mg at 03/28/22 0915   QUEtiapine (SEROQUEL) tablet 200 mg  200 mg Oral QHS Kynnedy Carreno T, MD   200 mg at 03/27/22 2102   senna-docusate (Senokot-S) tablet 2 tablet  2 tablet Oral Daily PRN Myracle Febres, [name], MD   2 tablet at 03/26/22 1724   thiamine (VITAMIN B1) tablet 100 mg  100 mg Oral Daily Charm Rings, NP   100 mg at 03/28/22 8563   traZODone (DESYREL) tablet 100 mg  100 mg Oral QHS PRN Charm Rings, NP   100 mg at 03/20/22 2129    Lab Results: No results found for this or any previous visit (from the past 48 hour(s)).  Blood Alcohol level:  Lab Results  Component Value Date   ETH 91 (H) 03/13/2022   ETH 108 (H) 10/29/2021    Metabolic Disorder Labs: Lab Results  Component Value Date   HGBA1C 5.2 03/24/2022   MPG 102.54 03/24/2022   No results found for: "PROLACTIN" Lab Results  Component Value Date   CHOL 219 (H) 03/24/2022   TRIG 476 (H) 03/24/2022   HDL 38 (L) 03/24/2022   CHOLHDL 5.8 03/24/2022   VLDL UNABLE TO CALCULATE IF TRIGLYCERIDE OVER 400 mg/dL 14/97/0263   LDLCALC UNABLE TO CALCULATE IF TRIGLYCERIDE OVER 400 mg/dL 78/58/8502   LDLCALC 774 (H) 05/30/2020    Physical Findings: AIMS: Facial and Oral Movements Muscles of Facial Expression: None, normal Lips and Perioral Area: None, normal Jaw: None, normal Tongue: None, normal,Extremity Movements Upper (arms, wrists, hands, fingers): None, normal Lower (legs, knees, ankles, toes): None, normal, Trunk Movements Neck, shoulders, hips: None, normal, Overall Severity Severity of abnormal movements (highest score from questions above): None, normal Incapacitation due to abnormal movements: None, normal Patient's awareness of abnormal movements (rate only patient's report): No Awareness, Dental Status Current problems with teeth and/or dentures?: No Does patient usually wear dentures?: No  CIWA:  CIWA-Ar Total: 2 COWS:      Musculoskeletal: Strength & Muscle Tone: within normal limits Gait & Station: normal Patient leans: N/A  Psychiatric Specialty Exam:  Presentation  General Appearance: Appropriate for Environment  Eye Contact:Good  Speech:Clear and Coherent  Speech Volume:Normal  Handedness:Right   Mood and Affect  Mood:Depressed  Affect:Congruent   Thought Process  Thought Processes:Coherent  Descriptions of Associations:Intact  Orientation:Full (  Time, Place and Person)  Thought Content:Perseveration  History of Schizophrenia/Schizoaffective disorder:No  Duration of Psychotic Symptoms:No data recorded Hallucinations:No data recorded Ideas of Reference:None  Suicidal Thoughts:No data recorded Homicidal Thoughts:No data recorded  Sensorium  Memory:Immediate Good  Judgment:Poor  Insight:Poor   Executive Functions  Concentration:Good  Attention Span:Good  Recall:Good  Fund of Knowledge:Fair  Language:Fair   Psychomotor Activity  Psychomotor Activity:No data recorded  Assets  Assets:Resilience; Physical Health; Social Support   Sleep  Sleep:No data recorded   Physical Exam: Physical Exam ROS Blood pressure 116/78, pulse 92, temperature 98.5 F (36.9 C), temperature source Oral, resp. rate 18, height 5' 8.5" (1.74 m), weight 88.9 kg, SpO2 97 %. Body mass index is 29.36 kg/m.   Treatment Plan Summary: Medication management and Plan reviewed medication with patient.  Tolerating Seroquel well.  Anxiety improving.  Sleeping better.  No significant side effects.  He has been given options about living situations and is pursuing all of the follow-up at this point with the assistance of the full treatment team  Mordecai Rasmussen, MD 03/28/2022, 10:26 AM

## 2022-03-28 NOTE — Plan of Care (Signed)
  Problem: Coping: Goal: Level of anxiety will decrease Outcome: Progressing   Problem: Safety: Goal: Ability to remain free from injury will improve Outcome: Progressing   Problem: Education: Goal: Emotional status will improve Outcome: Progressing Goal: Mental status will improve Outcome: Progressing   Problem: Activity: Goal: Interest or engagement in activities will improve Outcome: Progressing

## 2022-03-28 NOTE — Group Note (Signed)
[facility] LCSW Group Therapy Note   Group Date: 03/28/2022 Start Time: 1430 End Time: 1530   Type of Therapy/Topic:  Group Therapy:  Emotion Regulation  Participation Level:  Active    Description of Group:    The purpose of this group is to assist patients in learning to regulate negative emotions and experience positive emotions. Patients will be guided to discuss ways in which they have been vulnerable to their negative emotions. These vulnerabilities will be juxtaposed with experiences of positive emotions or situations, and patients challenged to use positive emotions to combat negative ones. Special emphasis will be placed on coping with negative emotions in conflict situations, and patients will process healthy conflict resolution skills.  Therapeutic Goals: Patient will identify two positive emotions or experiences to reflect on in order to balance out negative emotions:  Patient will label two or more emotions that they find the most difficult to experience:  Patient will be able to demonstrate positive conflict resolution skills through discussion or role plays:   Summary of Patient Progress: Patient was present for the entirety of group. He was actively involved in the conversation and his comments were on topic. Pt appeared receptive to feedback/comments from both facilitator and peers.   Therapeutic Modalities:   Cognitive Behavioral Therapy Feelings Identification Dialectical Behavioral Therapy   Glenis Smoker, LCSW

## 2022-03-28 NOTE — Progress Notes (Signed)
Patient pleasant and cooperative. Reports night meds he Is taking, vistaril and seroquel is working well together. Reports restful nights other than the noise from his peers. Patient denies Si, HI, AVH. Noted in dayroom socializing with peers.  Encouragement and support provided. Safety checks maintained. Meds given as prescribed. Pt receptive and remains safe on unit with q 15 min checks.

## 2022-03-29 DIAGNOSIS — F332 Major depressive disorder, recurrent severe without psychotic features: Secondary | ICD-10-CM | POA: Diagnosis not present

## 2022-03-29 NOTE — Progress Notes (Signed)
[facility] MD Progress Note  03/29/2022 1:42 PM Lee Sparks  MRN:  [id number] Subjective: Lee Sparks is seen on rounds.  He has no complaints.  He is taking his medication as prescribed and denies any side effects.  No issues.  Principal Problem: Major depressive disorder, recurrent severe without psychotic features (HCC) Diagnosis: Principal Problem:   Major depressive disorder, recurrent severe without psychotic features (HCC)  Total Time spent with patient: 15 minutes  Past Psychiatric History:  Past history of depression and anxiety and substance abuse  Past Medical History:  Past Medical History:  Diagnosis Date   Alcohol-induced pancreatitis    Anemia    Asthma    Bronchitis     Past Surgical History:  Procedure Laterality Date   CYSTECTOMY     ESOPHAGOGASTRODUODENOSCOPY  07/28/2011   Procedure: ESOPHAGOGASTRODUODENOSCOPY (EGD);  Surgeon: Freddy Jaksch, MD;  Location: [facility] ENDOSCOPY;  Service: Endoscopy;  Laterality: N/A;   HERNIA REPAIR     Family History:  Family History  Problem Relation Age of Onset   Hypertension Father    Aneurysm Father    Cancer Other    Hypertension Mother    Pneumonia Mother     Social History:  Social History   Substance and Sexual Activity  Alcohol Use Yes   Alcohol/week: 50.0 standard drinks of alcohol   Types: 50 Cans of beer per week   Comment: Equivalent of two 40 oz beers daily.     Social History   Substance and Sexual Activity  Drug Use No    Social History   Socioeconomic History   Marital status: Single    Spouse name: Not on file   Number of children: Not on file   Years of education: Not on file   Highest education level: Not on file  Occupational History   Not on file  Tobacco Use   Smoking status: Every Day    Packs/day: 0.50    Years: 15.00    Total pack years: 7.50    Types: Cigarettes    Last attempt to quit: 06/27/2011    Years since quitting: 10.7   Smokeless tobacco: Never  Substance and Sexual  Activity   Alcohol use: Yes    Alcohol/week: 50.0 standard drinks of alcohol    Types: 50 Cans of beer per week    Comment: Equivalent of two 40 oz beers daily.   Drug use: No   Sexual activity: Not Currently  Other Topics Concern   Not on file  Social History Narrative   Not on file   Social Determinants of Health   Financial Resource Strain: Not on file  Food Insecurity: Not on file  Transportation Needs: Not on file  Physical Activity: Not on file  Stress: Not on file  Social Connections: Not on file   Additional Social History:                         Sleep: Good  Appetite:  Good  Current Medications: Current Facility-Administered Medications  Medication Dose Route Frequency Provider Last Rate Last Admin   acetaminophen (TYLENOL) tablet 650 mg  650 mg Oral Q6H PRN Charm Rings, NP       alum & mag hydroxide-simeth (MAALOX/MYLANTA) 200-200-20 MG/5ML suspension 30 mL  30 mL Oral Q4H PRN Charm Rings, NP       amLODipine (NORVASC) tablet 5 mg  5 mg Oral Daily Sarina Ill, DO   5 mg  at 03/29/22 0810   citalopram (CELEXA) tablet 40 mg  40 mg Oral Daily [name], [name], MD   40 mg at 03/29/22 7096   docusate sodium (COLACE) capsule 200 mg  200 mg Oral BID [name], [name], MD   200 mg at 03/29/22 0809   folic acid (FOLVITE) tablet 1 mg  1 mg Oral Daily Sarina Ill, DO   1 mg at 03/29/22 0809   hydrOXYzine (ATARAX) tablet 50 mg  50 mg Oral Q6H PRN [name], [name], MD   50 mg at 03/29/22 0811   loratadine (CLARITIN) tablet 10 mg  10 mg Oral Daily [name], [name], MD   10 mg at 03/29/22 0810   magnesium hydroxide (MILK OF MAGNESIA) suspension 30 mL  30 mL Oral Daily PRN Charm Rings, NP   30 mL at 03/26/22 0925   naltrexone (DEPADE) tablet 50 mg  50 mg Oral Daily Charm Rings, NP   50 mg at 03/29/22 2836   nicotine polacrilex (NICORETTE) gum 2 mg  2 mg Oral PRN Sarina Ill, DO   2 mg at 03/28/22 1420   QUEtiapine (SEROQUEL)  tablet 200 mg  200 mg Oral QHS [name], [name], MD   200 mg at 03/28/22 2126   senna-docusate (Senokot-S) tablet 2 tablet  2 tablet Oral Daily PRN [name], [name], MD   2 tablet at 03/26/22 1724   thiamine (VITAMIN B1) tablet 100 mg  100 mg Oral Daily Charm Rings, NP   100 mg at 03/29/22 0809   traZODone (DESYREL) tablet 100 mg  100 mg Oral QHS PRN Charm Rings, NP   100 mg at 03/20/22 2129    Lab Results: No results found for this or any previous visit (from the past 48 hour(s)).  Blood Alcohol level:  Lab Results  Component Value Date   ETH 91 (H) 03/13/2022   ETH 108 (H) 10/29/2021    Metabolic Disorder Labs: Lab Results  Component Value Date   HGBA1C 5.2 03/24/2022   MPG 102.54 03/24/2022   No results found for: "PROLACTIN" Lab Results  Component Value Date   CHOL 219 (H) 03/24/2022   TRIG 476 (H) 03/24/2022   HDL 38 (L) 03/24/2022   CHOLHDL 5.8 03/24/2022   VLDL UNABLE TO CALCULATE IF TRIGLYCERIDE OVER 400 mg/dL 62/94/7654   LDLCALC UNABLE TO CALCULATE IF TRIGLYCERIDE OVER 400 mg/dL 65/09/5463   LDLCALC 681 (H) 05/30/2020    Physical Findings: AIMS: Facial and Oral Movements Muscles of Facial Expression: None, normal Lips and Perioral Area: None, normal Jaw: None, normal Tongue: None, normal,Extremity Movements Upper (arms, wrists, hands, fingers): None, normal Lower (legs, knees, ankles, toes): None, normal, Trunk Movements Neck, shoulders, hips: None, normal, Overall Severity Severity of abnormal movements (highest score from questions above): None, normal Incapacitation due to abnormal movements: None, normal Patient's awareness of abnormal movements (rate only patient's report): No Awareness, Dental Status Current problems with teeth and/or dentures?: No Does patient usually wear dentures?: No  CIWA:  CIWA-Ar Total: 2 COWS:     Musculoskeletal: Strength & Muscle Tone: within normal limits Gait & Station: normal Patient leans: N/A  Psychiatric  Specialty Exam:  Presentation  General Appearance: Appropriate for Environment  Eye Contact:Good  Speech:Clear and Coherent  Speech Volume:Normal  Handedness:Right   Mood and Affect  Mood:Depressed  Affect:Congruent   Thought Process  Thought Processes:Coherent  Descriptions of Associations:Intact  Orientation:Full (Time, Place and Person)  Thought Content:Perseveration  History of  Schizophrenia/Schizoaffective disorder:No  Duration of Psychotic Symptoms:No data recorded Hallucinations:No data recorded Ideas of Reference:None  Suicidal Thoughts:No data recorded Homicidal Thoughts:No data recorded  Sensorium  Memory:Immediate Good  Judgment:Poor  Insight:Poor   Executive Functions  Concentration:Good  Attention Span:Good  Recall:Good  Fund of Knowledge:Fair  Language:Fair   Psychomotor Activity  Psychomotor Activity:No data recorded  Assets  Assets:Resilience; Physical Health; Social Support   Sleep  Sleep:No data recorded    Blood pressure 126/85, pulse 68, temperature 98.4 F (36.9 C), temperature source Oral, resp. rate 18, height 5' 8.5" (1.74 m), weight 88.9 kg, SpO2 100 %. Body mass index is 29.36 kg/m.   Treatment Plan Summary: Daily contact with patient to assess and evaluate symptoms and progress in treatment, Medication management, and Plan continue current medications.  Holly Iannaccone Tre[place] Mall, DO 03/29/2022, 1:42 PM

## 2022-03-29 NOTE — Plan of Care (Signed)
D- Patient alert and oriented. Patient presented in a pleasant mood on assessment reporting that he slept "fair" last night and had no complaints to voice to this Clinical research associate. Patient endorsed hopelessness, depression, and anxiety on his self-inventory, but did not go into detail with this Clinical research associate as to why he's feeling this way. Patient did request PRN anxiety medication from this writer. Patient denied SI, HI, AVH, and pain at this time. Patient's goal for today is once again, "getting to rehab", in which he will "talk and call people", in order to achieve his goal.  A- Scheduled medications administered to patient, per MD orders. Support and encouragement provided.  Routine safety checks conducted every 15 minutes.  Patient informed to notify staff with problems or concerns.  R- No adverse drug reactions noted. Patient contracts for safety at this time. Patient compliant with medications and treatment plan. Patient receptive, calm, and cooperative. Patient interacts well with others on the unit. Patient remains safe at this time.  Problem: Education: Goal: Knowledge of General Education information will improve Description: Including pain rating scale, medication(s)/side effects and non-pharmacologic comfort measures Outcome: Progressing   Problem: Health Behavior/Discharge Planning: Goal: Ability to manage health-related needs will improve Outcome: Progressing   Problem: Clinical Measurements: Goal: Ability to maintain clinical measurements within normal limits will improve Outcome: Progressing Goal: Will remain free from infection Outcome: Progressing Goal: Diagnostic test results will improve Outcome: Progressing Goal: Respiratory complications will improve Outcome: Progressing Goal: Cardiovascular complication will be avoided Outcome: Progressing   Problem: Activity: Goal: Risk for activity intolerance will decrease Outcome: Progressing   Problem: Nutrition: Goal: Adequate nutrition  will be maintained Outcome: Progressing   Problem: Coping: Goal: Level of anxiety will decrease Outcome: Progressing   Problem: Elimination: Goal: Will not experience complications related to bowel motility Outcome: Progressing Goal: Will not experience complications related to urinary retention Outcome: Progressing   Problem: Pain Managment: Goal: General experience of comfort will improve Outcome: Progressing   Problem: Safety: Goal: Ability to remain free from injury will improve Outcome: Progressing   Problem: Skin Integrity: Goal: Risk for impaired skin integrity will decrease Outcome: Progressing   Problem: Education: Goal: Knowledge of [employer] General Education information/materials will improve Outcome: Progressing Goal: Emotional status will improve Outcome: Progressing Goal: Mental status will improve Outcome: Progressing Goal: Verbalization of understanding the information provided will improve Outcome: Progressing   Problem: Activity: Goal: Interest or engagement in activities will improve Outcome: Progressing Goal: Sleeping patterns will improve Outcome: Progressing   Problem: Coping: Goal: Ability to verbalize frustrations and anger appropriately will improve Outcome: Progressing Goal: Ability to demonstrate self-control will improve Outcome: Progressing   Problem: Health Behavior/Discharge Planning: Goal: Identification of resources available to assist in meeting health care needs will improve Outcome: Progressing Goal: Compliance with treatment plan for underlying cause of condition will improve Outcome: Progressing   Problem: Physical Regulation: Goal: Ability to maintain clinical measurements within normal limits will improve Outcome: Progressing   Problem: Safety: Goal: Periods of time without injury will increase Outcome: Progressing   Problem: Education: Goal: Ability to make informed decisions regarding treatment will  improve Outcome: Progressing   Problem: Medication: Goal: Compliance with prescribed medication regimen will improve Outcome: Progressing   Problem: Education: Goal: Utilization of techniques to improve thought processes will improve Outcome: Progressing Goal: Knowledge of the prescribed therapeutic regimen will improve Outcome: Progressing   Problem: Activity: Goal: Interest or engagement in leisure activities will improve Outcome: Progressing Goal: Imbalance in normal sleep/wake cycle  will improve Outcome: Progressing   Problem: Coping: Goal: Coping ability will improve Outcome: Progressing Goal: Will verbalize feelings Outcome: Progressing   Problem: Health Behavior/Discharge Planning: Goal: Ability to make decisions will improve Outcome: Progressing Goal: Compliance with therapeutic regimen will improve Outcome: Progressing   Problem: Role Relationship: Goal: Will demonstrate positive changes in social behaviors and relationships Outcome: Progressing   Problem: Safety: Goal: Ability to disclose and discuss suicidal ideas will improve Outcome: Progressing Goal: Ability to identify and utilize support systems that promote safety will improve Outcome: Progressing   Problem: Self-Concept: Goal: Will verbalize positive feelings about self Outcome: Progressing Goal: Level of anxiety will decrease Outcome: Progressing

## 2022-03-30 DIAGNOSIS — F332 Major depressive disorder, recurrent severe without psychotic features: Secondary | ICD-10-CM | POA: Diagnosis not present

## 2022-03-30 MED ORDER — RISPERIDONE 1 MG PO TABS
0.5000 mg | ORAL_TABLET | ORAL | Status: DC
  Administered 2022-03-30 – 2022-04-02 (×7): 0.5 mg via ORAL
  Filled 2022-03-30 (×7): qty 1

## 2022-03-30 NOTE — Plan of Care (Signed)
D- Patient alert and oriented. Patient presented in an anxious, but pleasant mood on assessment reporting that he slept "fair" last night and had no complaints to voice to this Clinical research associate. Patient endorsed feelings of hopelessness, depression, and anxiety, but did not elaborate. Patient denied SI, HI, AVH, and pain at this time. Patient's goal for today is to still find a rehab facility to get into.  A- Scheduled medications administered to patient, per MD orders. Support and encouragement provided.  Routine safety checks conducted every 15 minutes.  Patient informed to notify staff with problems or concerns.  R- No adverse drug reactions noted. Patient contracts for safety at this time. Patient compliant with medications and treatment plan. Patient receptive, calm, and cooperative. Patient interacts well with others on the unit.  Patient remains safe at this time.  Problem: Education: Goal: Knowledge of General Education information will improve Description: Including pain rating scale, medication(s)/side effects and non-pharmacologic comfort measures Outcome: Progressing   Problem: Health Behavior/Discharge Planning: Goal: Ability to manage health-related needs will improve Outcome: Progressing   Problem: Clinical Measurements: Goal: Ability to maintain clinical measurements within normal limits will improve Outcome: Progressing Goal: Will remain free from infection Outcome: Progressing Goal: Diagnostic test results will improve Outcome: Progressing Goal: Respiratory complications will improve Outcome: Progressing Goal: Cardiovascular complication will be avoided Outcome: Progressing   Problem: Activity: Goal: Risk for activity intolerance will decrease Outcome: Progressing   Problem: Nutrition: Goal: Adequate nutrition will be maintained Outcome: Progressing   Problem: Coping: Goal: Level of anxiety will decrease Outcome: Progressing   Problem: Elimination: Goal: Will not  experience complications related to bowel motility Outcome: Progressing Goal: Will not experience complications related to urinary retention Outcome: Progressing   Problem: Pain Managment: Goal: General experience of comfort will improve Outcome: Progressing   Problem: Safety: Goal: Ability to remain free from injury will improve Outcome: Progressing   Problem: Skin Integrity: Goal: Risk for impaired skin integrity will decrease Outcome: Progressing   Problem: Education: Goal: Knowledge of [place] General Education information/materials will improve Outcome: Progressing Goal: Emotional status will improve Outcome: Progressing Goal: Mental status will improve Outcome: Progressing Goal: Verbalization of understanding the information provided will improve Outcome: Progressing   Problem: Activity: Goal: Interest or engagement in activities will improve Outcome: Progressing Goal: Sleeping patterns will improve Outcome: Progressing   Problem: Coping: Goal: Ability to verbalize frustrations and anger appropriately will improve Outcome: Progressing Goal: Ability to demonstrate self-control will improve Outcome: Progressing   Problem: Health Behavior/Discharge Planning: Goal: Identification of resources available to assist in meeting health care needs will improve Outcome: Progressing Goal: Compliance with treatment plan for underlying cause of condition will improve Outcome: Progressing   Problem: Physical Regulation: Goal: Ability to maintain clinical measurements within normal limits will improve Outcome: Progressing   Problem: Safety: Goal: Periods of time without injury will increase Outcome: Progressing   Problem: Education: Goal: Ability to make informed decisions regarding treatment will improve Outcome: Progressing   Problem: Medication: Goal: Compliance with prescribed medication regimen will improve Outcome: Progressing   Problem: Education: Goal:  Utilization of techniques to improve thought processes will improve Outcome: Progressing Goal: Knowledge of the prescribed therapeutic regimen will improve Outcome: Progressing   Problem: Activity: Goal: Interest or engagement in leisure activities will improve Outcome: Progressing Goal: Imbalance in normal sleep/wake cycle will improve Outcome: Progressing   Problem: Coping: Goal: Coping ability will improve Outcome: Progressing Goal: Will verbalize feelings Outcome: Progressing   Problem: Health Behavior/Discharge Planning: Goal: Ability  to make decisions will improve Outcome: Progressing Goal: Compliance with therapeutic regimen will improve Outcome: Progressing   Problem: Role Relationship: Goal: Will demonstrate positive changes in social behaviors and relationships Outcome: Progressing   Problem: Safety: Goal: Ability to disclose and discuss suicidal ideas will improve Outcome: Progressing Goal: Ability to identify and utilize support systems that promote safety will improve Outcome: Progressing   Problem: Self-Concept: Goal: Will verbalize positive feelings about self Outcome: Progressing Goal: Level of anxiety will decrease Outcome: Progressing

## 2022-03-30 NOTE — Plan of Care (Signed)
  Problem: Education: Goal: Knowledge of General Education information will improve Description: Including pain rating scale, medication(s)/side effects and non-pharmacologic comfort measures Outcome: Progressing   Problem: Health Behavior/Discharge Planning: Goal: Ability to manage health-related needs will improve Outcome: Progressing   Problem: Clinical Measurements: Goal: Ability to maintain clinical measurements within normal limits will improve Outcome: Progressing Goal: Will remain free from infection Outcome: Progressing Goal: Diagnostic test results will improve Outcome: Progressing Goal: Respiratory complications will improve Outcome: Progressing Goal: Cardiovascular complication will be avoided Outcome: Progressing   Problem: Self-Concept: Goal: Will verbalize positive feelings about self Outcome: Progressing Goal: Level of anxiety will decrease Outcome: Progressing   Problem: Safety: Goal: Ability to disclose and discuss suicidal ideas will improve Outcome: Progressing Goal: Ability to identify and utilize support systems that promote safety will improve Outcome: Progressing   Problem: Role Relationship: Goal: Will demonstrate positive changes in social behaviors and relationships Outcome: Progressing   Problem: Health Behavior/Discharge Planning: Goal: Ability to make decisions will improve Outcome: Progressing Goal: Compliance with therapeutic regimen will improve Outcome: Progressing   Problem: Coping: Goal: Coping ability will improve Outcome: Progressing Goal: Will verbalize feelings Outcome: Progressing

## 2022-03-30 NOTE — [facility] Group Notes (Signed)
[facility] Group Notes:  (Nursing/MHT/Case Management/Adjunct)  Date:  03/30/2022  Time:  10:20 AM  Type of Therapy:   community meeting  Participation Level:  Did Not Attend    Lee Sparks 03/30/2022, 10:20 AM

## 2022-03-30 NOTE — Progress Notes (Signed)
[facility] MD Progress Note  03/30/2022 4:17 PM Lee Sparks  MRN:  [id number] Subjective: Lee Sparks is seen on rounds.  He still have a lot of anxiety.  He is on 50 mg of Atarax so I think adding something else might be helpful.  Principal Problem: Major depressive disorder, recurrent severe without psychotic features (HCC) Diagnosis: Principal Problem:   Major depressive disorder, recurrent severe without psychotic features (HCC)  Total Time spent with patient: 15 minutes  Past Psychiatric History: Past history of depression and anxiety and substance abuse  Past Medical History:  Past Medical History:  Diagnosis Date   Alcohol-induced pancreatitis    Anemia    Asthma    Bronchitis     Past Surgical History:  Procedure Laterality Date   CYSTECTOMY     ESOPHAGOGASTRODUODENOSCOPY  07/28/2011   Procedure: ESOPHAGOGASTRODUODENOSCOPY (EGD);  Surgeon: Freddy Jaksch, MD;  Location: [facility] ENDOSCOPY;  Service: Endoscopy;  Laterality: N/A;   HERNIA REPAIR     Family History:  Family History  Problem Relation Age of Onset   Hypertension Father    Aneurysm Father    Cancer Other    Hypertension Mother    Pneumonia Mother     Social History:  Social History   Substance and Sexual Activity  Alcohol Use Yes   Alcohol/week: 50.0 standard drinks of alcohol   Types: 50 Cans of beer per week   Comment: Equivalent of two 40 oz beers daily.     Social History   Substance and Sexual Activity  Drug Use No    Social History   Socioeconomic History   Marital status: Single    Spouse name: Not on file   Number of children: Not on file   Years of education: Not on file   Highest education level: Not on file  Occupational History   Not on file  Tobacco Use   Smoking status: Every Day    Packs/day: 0.50    Years: 15.00    Total pack years: 7.50    Types: Cigarettes    Last attempt to quit: 06/27/2011    Years since quitting: 10.7   Smokeless tobacco: Never  Substance and Sexual  Activity   Alcohol use: Yes    Alcohol/week: 50.0 standard drinks of alcohol    Types: 50 Cans of beer per week    Comment: Equivalent of two 40 oz beers daily.   Drug use: No   Sexual activity: Not Currently  Other Topics Concern   Not on file  Social History Narrative   Not on file   Social Determinants of Health   Financial Resource Strain: Not on file  Food Insecurity: Not on file  Transportation Needs: Not on file  Physical Activity: Not on file  Stress: Not on file  Social Connections: Not on file   Additional Social History:                         Sleep: Good  Appetite:  Good  Current Medications: Current Facility-Administered Medications  Medication Dose Route Frequency Provider Last Rate Last Admin   acetaminophen (TYLENOL) tablet 650 mg  650 mg Oral Q6H PRN Charm Rings, NP       alum & mag hydroxide-simeth (MAALOX/MYLANTA) 200-200-20 MG/5ML suspension 30 mL  30 mL Oral Q4H PRN Charm Rings, NP       amLODipine (NORVASC) tablet 5 mg  5 mg Oral Daily Sarina Ill, DO  5 mg at 03/30/22 0810   citalopram (CELEXA) tablet 40 mg  40 mg Oral Daily [name], [name], MD   40 mg at 03/30/22 0810   docusate sodium (COLACE) capsule 200 mg  200 mg Oral BID [name], [name], MD   200 mg at 03/30/22 1614   folic acid (FOLVITE) tablet 1 mg  1 mg Oral Daily Sarina Ill, DO   1 mg at 03/30/22 0810   hydrOXYzine (ATARAX) tablet 50 mg  50 mg Oral Q6H PRN [name], [name], MD   50 mg at 03/30/22 1508   loratadine (CLARITIN) tablet 10 mg  10 mg Oral Daily [name], [name], MD   10 mg at 03/30/22 0810   magnesium hydroxide (MILK OF MAGNESIA) suspension 30 mL  30 mL Oral Daily PRN Charm Rings, NP   30 mL at 03/26/22 0925   naltrexone (DEPADE) tablet 50 mg  50 mg Oral Daily Charm Rings, NP   50 mg at 03/30/22 9798   nicotine polacrilex (NICORETTE) gum 2 mg  2 mg Oral PRN Sarina Ill, DO   2 mg at 03/30/22 1508   QUEtiapine (SEROQUEL)  tablet 200 mg  200 mg Oral QHS [name], [name], MD   200 mg at 03/29/22 2109   risperiDONE (RISPERDAL) tablet 0.5 mg  0.5 mg Oral BH-q8a4p Sarina Ill, DO   0.5 mg at 03/30/22 1615   senna-docusate (Senokot-S) tablet 2 tablet  2 tablet Oral Daily PRN [name], [name], MD   2 tablet at 03/26/22 1724   thiamine (VITAMIN B1) tablet 100 mg  100 mg Oral Daily Charm Rings, NP   100 mg at 03/30/22 0810   traZODone (DESYREL) tablet 100 mg  100 mg Oral QHS PRN Charm Rings, NP   100 mg at 03/20/22 2129    Lab Results: No results found for this or any previous visit (from the past 48 hour(s)).  Blood Alcohol level:  Lab Results  Component Value Date   ETH 91 (H) 03/13/2022   ETH 108 (H) 10/29/2021    Metabolic Disorder Labs: Lab Results  Component Value Date   HGBA1C 5.2 03/24/2022   MPG 102.54 03/24/2022   No results found for: "PROLACTIN" Lab Results  Component Value Date   CHOL 219 (H) 03/24/2022   TRIG 476 (H) 03/24/2022   HDL 38 (L) 03/24/2022   CHOLHDL 5.8 03/24/2022   VLDL UNABLE TO CALCULATE IF TRIGLYCERIDE OVER 400 mg/dL 92/05/9416   LDLCALC UNABLE TO CALCULATE IF TRIGLYCERIDE OVER 400 mg/dL 40/81/4481   LDLCALC 856 (H) 05/30/2020    Physical Findings: AIMS: Facial and Oral Movements Muscles of Facial Expression: None, normal Lips and Perioral Area: None, normal Jaw: None, normal Tongue: None, normal,Extremity Movements Upper (arms, wrists, hands, fingers): None, normal Lower (legs, knees, ankles, toes): None, normal, Trunk Movements Neck, shoulders, hips: None, normal, Overall Severity Severity of abnormal movements (highest score from questions above): None, normal Incapacitation due to abnormal movements: None, normal Patient's awareness of abnormal movements (rate only patient's report): No Awareness, Dental Status Current problems with teeth and/or dentures?: No Does patient usually wear dentures?: No  CIWA:  CIWA-Ar Total: 2 COWS:      Musculoskeletal: Strength & Muscle Tone: within normal limits Gait & Station: normal Patient leans: N/A  Psychiatric Specialty Exam:  Presentation  General Appearance: Appropriate for Environment  Eye Contact:Good  Speech:Clear and Coherent  Speech Volume:Normal  Handedness:Right   Mood and Affect  Mood:Depressed  Affect:Congruent   Thought Process  Thought Processes:Coherent  Descriptions of Associations:Intact  Orientation:Full (Time, Place and Person)  Thought Content:Perseveration  History of Schizophrenia/Schizoaffective disorder:No  Duration of Psychotic Symptoms:No data recorded Hallucinations:No data recorded Ideas of Reference:None  Suicidal Thoughts:No data recorded Homicidal Thoughts:No data recorded  Sensorium  Memory:Immediate Good  Judgment:Poor  Insight:Poor   Executive Functions  Concentration:Good  Attention Span:Good  Recall:Good  Fund of Knowledge:Fair  Language:Fair   Psychomotor Activity  Psychomotor Activity:No data recorded  Assets  Assets:Resilience; Physical Health; Social Support   Sleep  Sleep:No data recorded    Blood pressure 128/88, pulse 85, temperature 98.2 F (36.8 C), temperature source Oral, resp. rate 18, height 5' 8.5" (1.74 m), weight 88.9 kg, SpO2 98 %. Body mass index is 29.36 kg/m.   Treatment Plan Summary: Daily contact with patient to assess and evaluate symptoms and progress in treatment, Medication management, and Plan start Risperdal 0.5 mg twice a day.  Sarina Ill, DO 03/30/2022, 4:17 PM

## 2022-03-30 NOTE — Group Note (Signed)
LCSW Group Therapy Note  Group Date: 03/30/2022 Start Time: 1300 End Time: 1400   Type of Therapy and Topic:  Group Therapy - How To Cope with Nervousness about Discharge   Participation Level:  Minimal   Description of Group This process group involved identification of patients' feelings about discharge. Some of them are scheduled to be discharged soon, while others are new admissions, but each of them was asked to share thoughts and feelings surrounding discharge from the hospital. One common theme was that they are excited at the prospect of going home, while another was that many of them are apprehensive about sharing why they were hospitalized. Patients were given the opportunity to discuss these feelings with their peers in preparation for discharge.  Therapeutic Goals  Patient will identify their overall feelings about pending discharge. Patient will think about how they might proactively address issues that they believe will once again arise once they get home (i.e. with parents). Patients will participate in discussion about having hope for change.   Summary of Patient Progress:  Pt was not very active throughout the session. Unable to tell his level of insight into the subject matter. Pt expressed a goal to get into a rehab facility. He was respectful of peers and participated throughout the entire session.   Therapeutic Modalities Cognitive Behavioral Therapy   Glenis Smoker, LCSW 03/30/2022  1:37 PM

## 2022-03-31 DIAGNOSIS — F332 Major depressive disorder, recurrent severe without psychotic features: Secondary | ICD-10-CM | POA: Diagnosis not present

## 2022-03-31 NOTE — Progress Notes (Signed)
Patient is pleasant and cooperative.  Continues to endorse anxiety and depression. Also some hopelessness as he waits on placement into rehab. He denies si  hi  avh.   He is med compliant and received his meds without incident. He is safe on the unit with q 15 safety rounds.  Encouraged him to seek staff with any concerns.     C Butler-Nicholson, LPN

## 2022-03-31 NOTE — Plan of Care (Signed)
Patient appropriate with staff & peers. Patient verbalizes anxiety and takes Vistaril as needed which helps. Denies SI,HI and AVH. Appetite and energy level good. ADLs maintained. Attended groups. Support and encouragement given.

## 2022-03-31 NOTE — Progress Notes (Signed)
[facility] MD Progress Note  03/31/2022 12:05 PM Lee Sparks  MRN:  [id number] Subjective: Patient seen and chart reviewed.  Patient has no new complaints.  Continues to feel anxious.  Not making a lot of progress on finding a place to go.  No behavior problems.  No active suicidal thoughts. Principal Problem: Major depressive disorder, recurrent severe without psychotic features (HCC) Diagnosis: Principal Problem:   Major depressive disorder, recurrent severe without psychotic features (HCC)  Total Time spent with patient: 30 minutes  Past Psychiatric History: Past history of anxiety and depression and substance use disorder  Past Medical History:  Past Medical History:  Diagnosis Date   Alcohol-induced pancreatitis    Anemia    Asthma    Bronchitis     Past Surgical History:  Procedure Laterality Date   CYSTECTOMY     ESOPHAGOGASTRODUODENOSCOPY  07/28/2011   Procedure: ESOPHAGOGASTRODUODENOSCOPY (EGD);  Surgeon: Freddy Jaksch, MD;  Location: [facility] ENDOSCOPY;  Service: Endoscopy;  Laterality: N/A;   HERNIA REPAIR     Family History:  Family History  Problem Relation Age of Onset   Hypertension Father    Aneurysm Father    Cancer Other    Hypertension Mother    Pneumonia Mother    Family Psychiatric  History: See previous Social History:  Social History   Substance and Sexual Activity  Alcohol Use Yes   Alcohol/week: 50.0 standard drinks of alcohol   Types: 50 Cans of beer per week   Comment: Equivalent of two 40 oz beers daily.     Social History   Substance and Sexual Activity  Drug Use No    Social History   Socioeconomic History   Marital status: Single    Spouse name: Not on file   Number of children: Not on file   Years of education: Not on file   Highest education level: Not on file  Occupational History   Not on file  Tobacco Use   Smoking status: Every Day    Packs/day: 0.50    Years: 15.00    Total pack years: 7.50    Types: Cigarettes    Last  attempt to quit: 06/27/2011    Years since quitting: 10.7   Smokeless tobacco: Never  Substance and Sexual Activity   Alcohol use: Yes    Alcohol/week: 50.0 standard drinks of alcohol    Types: 50 Cans of beer per week    Comment: Equivalent of two 40 oz beers daily.   Drug use: No   Sexual activity: Not Currently  Other Topics Concern   Not on file  Social History Narrative   Not on file   Social Determinants of Health   Financial Resource Strain: Not on file  Food Insecurity: Not on file  Transportation Needs: Not on file  Physical Activity: Not on file  Stress: Not on file  Social Connections: Not on file   Additional Social History:                         Sleep: Fair  Appetite:  Fair  Current Medications: Current Facility-Administered Medications  Medication Dose Route Frequency Provider Last Rate Last Admin   acetaminophen (TYLENOL) tablet 650 mg  650 mg Oral Q6H PRN Charm Rings, NP       alum & mag hydroxide-simeth (MAALOX/MYLANTA) 200-200-20 MG/5ML suspension 30 mL  30 mL Oral Q4H PRN Charm Rings, NP       amLODipine (NORVASC)  tablet 5 mg  5 mg Oral Daily Sarina Ill, DO   5 mg at 03/31/22 0805   citalopram (CELEXA) tablet 40 mg  40 mg Oral Daily Tyrez Berrios, [name], MD   40 mg at 03/31/22 1610   docusate sodium (COLACE) capsule 200 mg  200 mg Oral BID Chrisa Hassan T, MD   200 mg at 03/31/22 0805   folic acid (FOLVITE) tablet 1 mg  1 mg Oral Daily Sarina Ill, DO   1 mg at 03/31/22 9604   hydrOXYzine (ATARAX) tablet 50 mg  50 mg Oral Q6H PRN Angad Nabers, [name], MD   50 mg at 03/31/22 0807   loratadine (CLARITIN) tablet 10 mg  10 mg Oral Daily Hideko Esselman, [name], MD   10 mg at 03/31/22 0806   magnesium hydroxide (MILK OF MAGNESIA) suspension 30 mL  30 mL Oral Daily PRN Charm Rings, NP   30 mL at 03/26/22 0925   naltrexone (DEPADE) tablet 50 mg  50 mg Oral Daily Charm Rings, NP   50 mg at 03/31/22 5409   nicotine polacrilex  (NICORETTE) gum 2 mg  2 mg Oral PRN Sarina Ill, DO   2 mg at 03/30/22 1508   QUEtiapine (SEROQUEL) tablet 200 mg  200 mg Oral QHS Kahlin Mark T, MD   200 mg at 03/30/22 2103   risperiDONE (RISPERDAL) tablet 0.5 mg  0.5 mg Oral BH-q8a4p Sarina Ill, DO   0.5 mg at 03/31/22 8119   senna-docusate (Senokot-S) tablet 2 tablet  2 tablet Oral Daily PRN Ishmel Acevedo, [name], MD   2 tablet at 03/26/22 1724   thiamine (VITAMIN B1) tablet 100 mg  100 mg Oral Daily Charm Rings, NP   100 mg at 03/31/22 0805   traZODone (DESYREL) tablet 100 mg  100 mg Oral QHS PRN Charm Rings, NP   100 mg at 03/20/22 2129    Lab Results: No results found for this or any previous visit (from the past 48 hour(s)).  Blood Alcohol level:  Lab Results  Component Value Date   ETH 91 (H) 03/13/2022   ETH 108 (H) 10/29/2021    Metabolic Disorder Labs: Lab Results  Component Value Date   HGBA1C 5.2 03/24/2022   MPG 102.54 03/24/2022   No results found for: "PROLACTIN" Lab Results  Component Value Date   CHOL 219 (H) 03/24/2022   TRIG 476 (H) 03/24/2022   HDL 38 (L) 03/24/2022   CHOLHDL 5.8 03/24/2022   VLDL UNABLE TO CALCULATE IF TRIGLYCERIDE OVER 400 mg/dL 14/78/2956   LDLCALC UNABLE TO CALCULATE IF TRIGLYCERIDE OVER 400 mg/dL 21/30/8657   LDLCALC 846 (H) 05/30/2020    Physical Findings: AIMS: Facial and Oral Movements Muscles of Facial Expression: None, normal Lips and Perioral Area: None, normal Jaw: None, normal Tongue: None, normal,Extremity Movements Upper (arms, wrists, hands, fingers): None, normal Lower (legs, knees, ankles, toes): None, normal, Trunk Movements Neck, shoulders, hips: None, normal, Overall Severity Severity of abnormal movements (highest score from questions above): None, normal Incapacitation due to abnormal movements: None, normal Patient's awareness of abnormal movements (rate only patient's report): No Awareness, Dental Status Current problems with  teeth and/or dentures?: No Does patient usually wear dentures?: No  CIWA:  CIWA-Ar Total: 2 COWS:     Musculoskeletal: Strength & Muscle Tone: within normal limits Gait & Station: normal Patient leans: N/A  Psychiatric Specialty Exam:  Presentation  General Appearance: Appropriate for Environment  Eye Contact:Good  Speech:Clear  and Coherent  Speech Volume:Normal  Handedness:Right   Mood and Affect  Mood:Depressed  Affect:Congruent   Thought Process  Thought Processes:Coherent  Descriptions of Associations:Intact  Orientation:Full (Time, Place and Person)  Thought Content:Perseveration  History of Schizophrenia/Schizoaffective disorder:No  Duration of Psychotic Symptoms:No data recorded Hallucinations:No data recorded Ideas of Reference:None  Suicidal Thoughts:No data recorded Homicidal Thoughts:No data recorded  Sensorium  Memory:Immediate Good  Judgment:Poor  Insight:Poor   Executive Functions  Concentration:Good  Attention Span:Good  Recall:Good  Fund of Knowledge:Fair  Language:Fair   Psychomotor Activity  Psychomotor Activity:No data recorded  Assets  Assets:Resilience; Physical Health; Social Support   Sleep  Sleep:No data recorded   Physical Exam: Physical Exam Vitals and nursing note reviewed.  Constitutional:      Appearance: Normal appearance.  HENT:     Head: Normocephalic and atraumatic.     Mouth/Throat:     Pharynx: Oropharynx is clear.  Eyes:     Pupils: Pupils are equal, round, and reactive to light.  Cardiovascular:     Rate and Rhythm: Normal rate and regular rhythm.  Pulmonary:     Effort: Pulmonary effort is normal.     Breath sounds: Normal breath sounds.  Abdominal:     General: Abdomen is flat.     Palpations: Abdomen is soft.  Musculoskeletal:        General: Normal range of motion.  Skin:    General: Skin is warm and dry.  Neurological:     General: No focal deficit present.     Mental  Status: He is alert. Mental status is at baseline.  Psychiatric:        Mood and Affect: Mood normal.        Thought Content: Thought content normal.    Review of Systems  Constitutional: Negative.   HENT: Negative.    Eyes: Negative.   Respiratory: Negative.    Cardiovascular: Negative.   Gastrointestinal: Negative.   Musculoskeletal: Negative.   Skin: Negative.   Neurological: Negative.   Psychiatric/Behavioral: Negative.     Blood pressure (!) 115/90, pulse 78, temperature 98 F (36.7 C), temperature source Oral, resp. rate 18, height 5' 8.5" (1.74 m), weight 88.9 kg, SpO2 99 %. Body mass index is 29.36 kg/m.   Treatment Plan Summary: Plan he reports that his mood is better but he is still anxious.  I will review medication and see if there is anything we can reasonably change.  Encourage him to keep working on discharge planning.  Mordecai Rasmussen, MD 03/31/2022, 12:05 PM

## 2022-03-31 NOTE — [facility] Group Notes (Signed)
[facility] Group Notes:  (Nursing/MHT/Case Management/Adjunct)  Date:  03/31/2022  Time:  9:54 PM  Type of Therapy:  Group Therapy  Participation Level:  Active  Participation Quality:  Appropriate and Attentive  Affect:  Appropriate  Cognitive:  Alert and Appropriate  Insight:  Appropriate  Engagement in Group:  Engaged  Modes of Intervention:  Activity and Discussion  Summary of Progress/Problems:  [name],Luva Metzger E 03/31/2022, 9:54 PM

## 2022-03-31 NOTE — Group Note (Signed)
[facility] LCSW Group Therapy Note    Group Date: 03/31/2022 Start Time: 1330 End Time: 1345  Type of Therapy and Topic:  Group Therapy:  Overcoming Obstacles  Participation Level:  [facility] PARTICIPATION LEVEL: Did Not Attend  Mood:  Description of Group:   In this group patients will be encouraged to explore what they see as obstacles to their own wellness and recovery. They will be guided to discuss their thoughts, feelings, and behaviors related to these obstacles. The group will process together ways to cope with barriers, with attention given to specific choices patients can make. Each patient will be challenged to identify changes they are motivated to make in order to overcome their obstacles. This group will be process-oriented, with patients participating in exploration of their own experiences as well as giving and receiving support and challenge from other group members.  Therapeutic Goals: 1. Patient will identify personal and current obstacles as they relate to admission. 2. Patient will identify barriers that currently interfere with their wellness or overcoming obstacles.  3. Patient will identify feelings, thought process and behaviors related to these barriers. 4. Patient will identify two changes they are willing to make to overcome these obstacles:    Summary of Patient Progress   Patient was asleep when group offered.  CSW provided patient with a worksheet in lieu of group.    Therapeutic Modalities:   Cognitive Behavioral Therapy Solution Focused Therapy Motivational Interviewing Relapse Prevention Therapy   Brodie Scovell J Ola Fawver, LCSW 

## 2022-04-01 ENCOUNTER — Other Ambulatory Visit: Payer: Self-pay

## 2022-04-01 DIAGNOSIS — F332 Major depressive disorder, recurrent severe without psychotic features: Secondary | ICD-10-CM | POA: Diagnosis not present

## 2022-04-01 MED ORDER — LORATADINE 10 MG PO TABS
10.0000 mg | ORAL_TABLET | Freq: Every day | ORAL | 0 refills | Status: DC
Start: 2022-04-01 — End: 2022-04-02
  Filled 2022-04-01: qty 30, 30d supply, fill #0

## 2022-04-01 MED ORDER — RISPERIDONE 0.5 MG PO TABS
0.5000 mg | ORAL_TABLET | ORAL | 0 refills | Status: DC
Start: 2022-04-01 — End: 2022-04-02
  Filled 2022-04-01: qty 60, 30d supply, fill #0

## 2022-04-01 MED ORDER — QUETIAPINE FUMARATE 200 MG PO TABS
200.0000 mg | ORAL_TABLET | Freq: Every day | ORAL | 0 refills | Status: DC
  Filled 2022-04-01: qty 30, 30d supply, fill #0

## 2022-04-01 MED ORDER — TRAZODONE HCL 100 MG PO TABS
100.0000 mg | ORAL_TABLET | Freq: Every evening | ORAL | 0 refills | Status: DC | PRN
Start: 2022-04-01 — End: 2022-04-02
  Filled 2022-04-01: qty 30, 30d supply, fill #0

## 2022-04-01 MED ORDER — NALTREXONE HCL 50 MG PO TABS
50.0000 mg | ORAL_TABLET | Freq: Every day | ORAL | 0 refills | Status: DC
Start: 2022-04-02 — End: 2022-04-02
  Filled 2022-04-01: qty 30, 30d supply, fill #0

## 2022-04-01 MED ORDER — HYDROXYZINE HCL 50 MG PO TABS
50.0000 mg | ORAL_TABLET | Freq: Four times a day (QID) | ORAL | 1 refills | Status: DC | PRN
Start: 2022-04-01 — End: 2022-04-02
  Filled 2022-04-01: qty 60, 15d supply, fill #0

## 2022-04-01 MED ORDER — CITALOPRAM HYDROBROMIDE 40 MG PO TABS
40.0000 mg | ORAL_TABLET | Freq: Every day | ORAL | 0 refills | Status: DC
  Filled 2022-04-01: qty 30, 30d supply, fill #0

## 2022-04-01 MED ORDER — AMLODIPINE BESYLATE 5 MG PO TABS
5.0000 mg | ORAL_TABLET | Freq: Every day | ORAL | 0 refills | Status: DC
Start: 2022-04-01 — End: 2022-04-02
  Filled 2022-04-01: qty 30, 30d supply, fill #0

## 2022-04-01 MED ORDER — PANTOPRAZOLE SODIUM 20 MG PO TBEC
20.0000 mg | DELAYED_RELEASE_TABLET | Freq: Every day | ORAL | 1 refills | Status: DC
Start: 2022-04-01 — End: 2022-04-02
  Filled 2022-04-01: qty 30, 30d supply, fill #0

## 2022-04-01 NOTE — Progress Notes (Signed)
Patient calm and pleasant during assessment. Pt denies SI/HI/AVH. Pt stated he was feeling better tonight. Pt compliant with medication administration per MD orders. Pt observed interacting appropriately with staff and peers on the unit. Pt given education, support, and encouragement to be active in his treatment plan. Pt being monitored Q 15 minutes for safety per unit protocol. Pt remains safe on the unit.  

## 2022-04-01 NOTE — Group Note (Signed)
[facility] LCSW Group Therapy Note    Group Date: 04/01/2022 Start Time: 1330 End Time: 1430  Type of Therapy and Topic:  Group Therapy:  Overcoming Obstacles  Participation Level:  [facility] PARTICIPATION LEVEL: Minimal   Description of Group:   In this group patients will be encouraged to explore what they see as obstacles to their own wellness and recovery. They will be guided to discuss their thoughts, feelings, and behaviors related to these obstacles. The group will process together ways to cope with barriers, with attention given to specific choices patients can make. Each patient will be challenged to identify changes they are motivated to make in order to overcome their obstacles. This group will be process-oriented, with patients participating in exploration of their own experiences as well as giving and receiving support and challenge from other group members.  Therapeutic Goals: 1. Patient will identify personal and current obstacles as they relate to admission. 2. Patient will identify barriers that currently interfere with their wellness or overcoming obstacles.  3. Patient will identify feelings, thought process and behaviors related to these barriers. 4. Patient will identify two changes they are willing to make to overcome these obstacles:    Summary of Patient Progress Patient came in at the end of class while group was wrapping up. He shared that he had been accepted into a program. There was no participation in the discussion of the group topic.   Therapeutic Modalities:   Cognitive Behavioral Therapy Solution Focused Therapy Motivational Interviewing Relapse Prevention Therapy   Glenis Smoker, LCSW

## 2022-04-01 NOTE — BH IP Treatment Plan (Unsigned)
Interdisciplinary Treatment and Diagnostic Plan Update  04/01/2022 Time of Session: 8:30AM Lee Sparks MRN: [id number]  Principal Diagnosis: Major depressive disorder, recurrent severe without psychotic features (HCC)  Secondary Diagnoses: Principal Problem:   Major depressive disorder, recurrent severe without psychotic features (HCC)   Current Medications:  Current Facility-Administered Medications  Medication Dose Route Frequency Provider Last Rate Last Admin   acetaminophen (TYLENOL) tablet 650 mg  650 mg Oral Q6H PRN Charm Rings, NP       alum & mag hydroxide-simeth (MAALOX/MYLANTA) 200-200-20 MG/5ML suspension 30 mL  30 mL Oral Q4H PRN Charm Rings, NP       amLODipine (NORVASC) tablet 5 mg  5 mg Oral Daily Sarina Ill, DO   5 mg at 04/01/22 0800   citalopram (CELEXA) tablet 40 mg  40 mg Oral Daily [name], [name], MD   40 mg at 04/01/22 0759   docusate sodium (COLACE) capsule 200 mg  200 mg Oral BID [name], [name], MD   200 mg at 04/01/22 0800   folic acid (FOLVITE) tablet 1 mg  1 mg Oral Daily Sarina Ill, DO   1 mg at 04/01/22 0800   hydrOXYzine (ATARAX) tablet 50 mg  50 mg Oral Q6H PRN [name], [name], MD   50 mg at 04/01/22 0801   loratadine (CLARITIN) tablet 10 mg  10 mg Oral Daily [name], [name], MD   10 mg at 04/01/22 0800   magnesium hydroxide (MILK OF MAGNESIA) suspension 30 mL  30 mL Oral Daily PRN Charm Rings, NP   30 mL at 03/26/22 0925   naltrexone (DEPADE) tablet 50 mg  50 mg Oral Daily Charm Rings, NP   50 mg at 04/01/22 0759   nicotine polacrilex (NICORETTE) gum 2 mg  2 mg Oral PRN Sarina Ill, DO   2 mg at 03/31/22 1436   QUEtiapine (SEROQUEL) tablet 200 mg  200 mg Oral QHS [name], [name], MD   200 mg at 03/31/22 2102   risperiDONE (RISPERDAL) tablet 0.5 mg  0.5 mg Oral [id number] Sarina Ill, DO   0.5 mg at 04/01/22 0800   senna-docusate (Senokot-S) tablet 2 tablet  2 tablet Oral Daily PRN [name],  [name], MD   2 tablet at 03/26/22 1724   thiamine (VITAMIN B1) tablet 100 mg  100 mg Oral Daily Charm Rings, NP   100 mg at 04/01/22 0800   traZODone (DESYREL) tablet 100 mg  100 mg Oral QHS PRN Charm Rings, NP   100 mg at 03/20/22 2129   PTA Medications: Medications Prior to Admission  Medication Sig Dispense Refill Last Dose   amLODipine (NORVASC) 5 MG tablet Take 1 tablet (5 mg total) by mouth daily. (Patient not taking: Reported on 03/13/2022) 30 tablet 1    aspirin 81 MG EC tablet Take 1 tablet (81 mg total) by mouth daily. Swallow whole. (Patient not taking: Reported on 03/13/2022) 30 tablet 1    buPROPion (WELLBUTRIN XL) 150 MG 24 hr tablet Take 1 tablet (150 mg total) by mouth daily. (Patient not taking: Reported on 03/13/2022) 30 tablet 1    FLUoxetine (PROZAC) 20 MG capsule Take 1 capsule (20 mg total) by mouth daily. (Patient not taking: Reported on 03/13/2022) 30 capsule 1    fluticasone (FLONASE) 50 MCG/ACT nasal spray Place 2 sprays into both nostrils daily. (Patient not taking: Reported on 03/13/2022) 16 g 1    hydrOXYzine (ATARAX) 50 MG tablet  Take 1 tablet (50 mg total) by mouth every 6 (six) hours as needed for anxiety (sleep). (Patient not taking: Reported on 03/13/2022) 60 tablet 1    ibuprofen (ADVIL) 600 MG tablet Take 1 tablet (600 mg total) by mouth every 6 (six) hours as needed for mild pain, moderate pain or headache. (Patient not taking: Reported on 03/13/2022) 60 tablet 1    loratadine (CLARITIN) 10 MG tablet Take 1 tablet (10 mg total) by mouth daily. (Patient not taking: Reported on 03/13/2022) 30 tablet 1    nicotine polacrilex (NICORETTE) 2 MG gum Take 1 each (2 mg total) by mouth as needed for smoking cessation. (Patient not taking: Reported on 03/13/2022) 110 each 0    pantoprazole (PROTONIX) 20 MG tablet Take 1 tablet (20 mg total) by mouth daily. (Patient not taking: Reported on 03/13/2022) 30 tablet 1    QUEtiapine (SEROQUEL) 100 MG tablet Take 1 tablet (100 mg  total) by mouth at bedtime as needed (sleep). 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   Physician Treatment Plan for Primary Diagnosis: Major depressive disorder, recurrent severe without psychotic features (HCC) Long Term Goal(s): Improvement in symptoms so as ready for discharge   Short Term Goals: Ability to identify changes in lifestyle to reduce recurrence of condition will improve Ability to verbalize feelings will improve Ability to disclose and discuss suicidal ideas Ability to demonstrate self-control will improve Ability to identify and develop effective coping behaviors will improve Ability to maintain clinical measurements within normal limits will improve Compliance with prescribed medications will improve Ability to identify triggers associated with substance abuse/mental health issues will improve  Medication Management: Evaluate patient's response, side effects, and tolerance of medication regimen.  Therapeutic Interventions: 1 to 1 sessions, Unit Group sessions and Medication administration.  Evaluation of Outcomes: Progressing  Physician Treatment Plan for Secondary Diagnosis: Principal Problem:   Major depressive disorder, recurrent severe without psychotic features (HCC)  Long Term Goal(s): Improvement in symptoms so as ready for discharge   Short Term Goals: Ability to identify changes in lifestyle to reduce recurrence of condition will improve Ability to verbalize feelings will improve Ability to disclose and discuss suicidal ideas Ability to  demonstrate self-control will improve Ability to identify and develop effective coping behaviors will improve Ability to maintain clinical measurements within normal limits will improve Compliance with prescribed medications will improve Ability to identify triggers associated with substance abuse/mental health issues will improve     Medication Management: Evaluate patient's response, side effects, and tolerance of medication regimen.  Therapeutic Interventions: 1 to 1 sessions, Unit Group sessions and Medication administration.  Evaluation of Outcomes: Progressing   RN Treatment Plan for Primary Diagnosis: Major depressive disorder, recurrent severe without psychotic features (HCC) Long Term Goal(s): Knowledge of disease and therapeutic regimen to maintain health will improve  Short Term Goals: Ability to remain free from injury will improve, Ability to verbalize frustration and anger appropriately will improve, Ability to demonstrate self-control, Ability to participate in decision making will improve, Ability to verbalize feelings will improve, Ability to disclose and discuss suicidal ideas, Ability to identify and develop effective  coping behaviors will improve, and Compliance with prescribed medications will improve  Medication Management: RN will administer medications as ordered by provider, will assess and evaluate patient's response and provide education to patient for prescribed medication. RN will report any adverse and/or side effects to prescribing provider.  Therapeutic Interventions: 1 on 1 counseling sessions, Psychoeducation, Medication administration, Evaluate responses to treatment, Monitor vital signs and CBGs as ordered, Perform/monitor CIWA, COWS, AIMS and Fall Risk screenings as ordered, Perform wound care treatments as ordered.  Evaluation of Outcomes: Progressing   LCSW Treatment Plan for Primary Diagnosis: Major depressive disorder, recurrent severe without  psychotic features (HCC) Long Term Goal(s): Safe transition to appropriate next level of care at discharge, Engage patient in therapeutic group addressing interpersonal concerns.  Short Term Goals: Engage patient in aftercare planning with referrals and resources, Increase social support, Increase ability to appropriately verbalize feelings, Increase emotional regulation, Facilitate acceptance of mental health diagnosis and concerns, Facilitate patient progression through stages of change regarding substance use diagnoses and concerns, Identify triggers associated with mental health/substance abuse issues, and Increase skills for wellness and recovery  Therapeutic Interventions: Assess for all discharge needs, 1 to 1 time with Social worker, Explore available resources and support systems, Assess for adequacy in community support network, Educate family and significant other(s) on suicide prevention, Complete Psychosocial Assessment, Interpersonal group therapy.  Evaluation of Outcomes: Progressing   Progress in Treatment: Attending groups: Yes. Participating in groups: Yes. Taking medication as prescribed: Yes. Toleration medication: Yes. Family/Significant other contact made: No, will contact:  if given permission.  Patient understands diagnosis: Yes. Discussing patient identified problems/goals with staff: Yes. Medical problems stabilized or resolved: Yes. Denies suicidal/homicidal ideation: Yes. Issues/concerns per patient self-inventory: No. Other: none.  New problem(s) identified: No, Describe:  none identified. 03/24/22 Update: No changes at this time.  Update 03/27/2022:  No changes at this time. Update 04/01/22: No changes at this time.    New Short Term/Long Term Goal(s):  medication management for mood stabilization; elimination of SI thoughts; development of comprehensive mental wellness/sobriety plan. 03/24/22 Update: No changes at this time.  Update 03/27/2022:  No changes at this  time. Update 04/01/22: No changes at this time.   Patient Goals:  "Trying get medicine right so I won't be going through these spells again." 03/24/22 Update: No changes at this time.  Update 03/27/2022:  No changes at this time. Update 04/01/22: No changes at this time.   Discharge Plan or Barriers: CSW will assist pt with development of an appropriate aftercare/discharge plan. 03/24/22 Update: Applications/referrals have been submitted to [facility], [facility], and [facility]. [facility] will not be able to take anyone for six weeks. Contacts have been made with [facility] regarding a screening that was scheduled for Friday, 03/21/22 at 9am which did not happen as intake coordinator could not be reached. CSW to follow up on [facility]. Update 03/27/2022:  [facility] has denied patient at this time due to having been through their program several times, most recently less than 6 months ago.  They recommended that the patient complete another program and have some clean time before reapplying.  Patient was also denied at [facility] due to the facility not being within the [place] catchment area.  Application has been sent to [facility] and [facility], CSW awaiting return calls. Update 04/01/22: Pt was denied by [facility] program coordinator stating that he needs a higher level of care than they can offer. Pt also updated that [facility] is not currently open while awaiting  approval from the state. CSW to follow up regarding [facility] application.    Reason for Continuation of Hospitalization: Anxiety Depression Medication stabilization Suicidal ideation Withdrawal symptoms   Estimated Length of Stay:  1-7 days 03/24/22 Update: TBD Update 03/27/22: No changes at this time. Update 04/01/22: No changes at this time.  Last 3 [place] Suicide Severity Risk Score: Flowsheet Row Admission (Current) from 03/15/2022 in [facility] INPATIENT BEHAVIORAL MEDICINE ED from [facility]/13/2022 in [facility] Admission (Discharged) from 10/29/2021 in  [facility] INPATIENT BEHAVIORAL MEDICINE  C-SSRS RISK CATEGORY No Risk High Risk Moderate Risk       Last PHQ 2/9 Scores:    05/10/2020    3:58 PM  Depression screen PHQ 2/9  Decreased Interest 0  Down, Depressed, Hopeless 1  PHQ - 2 Score 1  Altered sleeping 1  Tired, decreased energy 0  Change in appetite 0  Feeling bad or failure about yourself  0  Trouble concentrating 1  Moving slowly or fidgety/restless 0  Suicidal thoughts 0  PHQ-9 Score 3  Difficult doing work/chores Somewhat difficult    Scribe for Treatment Team: Glenis Smoker, LCSW 04/01/2022 8:51 AM

## 2022-04-01 NOTE — Progress Notes (Signed)
   03/31/22 2000  Psych Admission Type (Psych Patients Only)  Admission Status Voluntary  Psychosocial Assessment  Patient Complaints Anxiety  Eye Contact Fair  Facial Expression Anxious;Worried  Affect Appropriate to circumstance  Surveyor, minerals Activity Other (Comment)  Appearance/Hygiene Unremarkable  Behavior Characteristics Cooperative;Appropriate to situation  Mood Anxious  Aggressive Behavior  Effect No apparent injury  Thought Process  Coherency WDL  Content WDL  Delusions None reported or observed  Perception WDL  Hallucination None reported or observed  Judgment WDL  Confusion None  Danger to Self  Current suicidal ideation? Denies (Denies)  Self-Injurious Behavior No self-injurious ideation or behavior indicators observed or expressed  (No)  Agreement Not to Harm Self Yes  Description of Agreement Verbal

## 2022-04-01 NOTE — [facility] Counselor (Addendum)
Pt came into group at the end and informed CSW that he had been accepted into a program called [facility]. CSW received the address ([address] [place], [place] [address]) and phone number [phone]) to reach out to [facility] regarding acceptance. After this pt sat down for the rest of group. When walking out of the group room he shared that they want him there early in the morning and that CSW needed to contact them to make arrangements. CSW stated that he was going to contact them. No other concerns expressed. Contact ended wtihout incident.   CSW attempted to contact [place] of [address] [facility]. Contact was unable to be established but HIPPA compliant voicemail left with contact information for follow through.   Vilma Meckel. [name], MSW, LCSW, LCAS 04/01/2022 2:32 PM  CSW received call from [place] of [address] [facility]. He confirmed that pt has been accepted into their program and can come tomorrow morning. CSW stated that he would work on getting pt there as early as he could. CSW inquired if there was anything that needed to be done on the hospital's behalf. He denied anything. No other concerns expressed. Contact ended without incident.   CSW updated provider regarding acceptance via secure text.   Vilma Meckel. [name], MSW, LCSW, LCAS 04/01/2022 3:14 PM

## 2022-04-01 NOTE — [facility] Group Notes (Signed)
[facility] Group Notes:  (Nursing/MHT/Case Management/Adjunct)  Date:  04/01/2022  Time:  9:02 PM  Type of Therapy:  Group Therapy Wrap up   Participation Level:  Active  Participation Quality:  Appropriate  Affect:  Appropriate  Cognitive:  Appropriate  Insight:  Appropriate  Engagement in Group:  Engaged  Modes of Intervention:  Discussion  Summary of Progress/Problems:  Lee Sparks,Lee Sparks 04/01/2022, 9:02 PM

## 2022-04-01 NOTE — Plan of Care (Signed)
Patient is happy that he got accepted at a rehab program. Patient stated that his chest congestion resolved at day time. Appropriate with staff & peers. Denies SI,HI and AVH. Appetite and energy level good. Support and encouragement given.

## 2022-04-01 NOTE — Progress Notes (Signed)
[facility] MD Progress Note  04/01/2022 3:38 PM Lee Sparks  MRN:  [id number] Subjective: No complaint.  Still has been mildly anxious but under control.  Today learned that he does have a bed available at rehab and has been accepted for tomorrow.  He did mention having some chest discomfort at night which sounds like reflux Principal Problem: Major depressive disorder, recurrent severe without psychotic features (HCC) Diagnosis: Principal Problem:   Major depressive disorder, recurrent severe without psychotic features (HCC)  Total Time spent with patient: 30 minutes  Past Psychiatric History: Past history of chronic anxiety and substance use  Past Medical History:  Past Medical History:  Diagnosis Date   Alcohol-induced pancreatitis    Anemia    Asthma    Bronchitis     Past Surgical History:  Procedure Laterality Date   CYSTECTOMY     ESOPHAGOGASTRODUODENOSCOPY  07/28/2011   Procedure: ESOPHAGOGASTRODUODENOSCOPY (EGD);  Surgeon: Freddy Jaksch, MD;  Location: [facility] ENDOSCOPY;  Service: Endoscopy;  Laterality: N/A;   HERNIA REPAIR     Family History:  Family History  Problem Relation Age of Onset   Hypertension Father    Aneurysm Father    Cancer Other    Hypertension Mother    Pneumonia Mother    Family Psychiatric  History: See previous Social History:  Social History   Substance and Sexual Activity  Alcohol Use Yes   Alcohol/week: 50.0 standard drinks of alcohol   Types: 50 Cans of beer per week   Comment: Equivalent of two 40 oz beers daily.     Social History   Substance and Sexual Activity  Drug Use No    Social History   Socioeconomic History   Marital status: Single    Spouse name: Not on file   Number of children: Not on file   Years of education: Not on file   Highest education level: Not on file  Occupational History   Not on file  Tobacco Use   Smoking status: Every Day    Packs/day: 0.50    Years: 15.00    Total pack years: 7.50    Types:  Cigarettes    Last attempt to quit: 06/27/2011    Years since quitting: 10.7   Smokeless tobacco: Never  Substance and Sexual Activity   Alcohol use: Yes    Alcohol/week: 50.0 standard drinks of alcohol    Types: 50 Cans of beer per week    Comment: Equivalent of two 40 oz beers daily.   Drug use: No   Sexual activity: Not Currently  Other Topics Concern   Not on file  Social History Narrative   Not on file   Social Determinants of Health   Financial Resource Strain: Not on file  Food Insecurity: Not on file  Transportation Needs: Not on file  Physical Activity: Not on file  Stress: Not on file  Social Connections: Not on file   Additional Social History:                         Sleep: Fair  Appetite:  Fair  Current Medications: Current Facility-Administered Medications  Medication Dose Route Frequency Provider Last Rate Last Admin   acetaminophen (TYLENOL) tablet 650 mg  650 mg Oral Q6H PRN Charm Rings, NP       alum & mag hydroxide-simeth (MAALOX/MYLANTA) 200-200-20 MG/5ML suspension 30 mL  30 mL Oral Q4H PRN Charm Rings, NP  amLODipine (NORVASC) tablet 5 mg  5 mg Oral Daily Sarina Ill, DO   5 mg at 04/01/22 0800   citalopram (CELEXA) tablet 40 mg  40 mg Oral Daily Brantlee Penn, [name], MD   40 mg at 04/01/22 0759   docusate sodium (COLACE) capsule 200 mg  200 mg Oral BID Dewanda Fennema T, MD   200 mg at 04/01/22 0800   folic acid (FOLVITE) tablet 1 mg  1 mg Oral Daily Sarina Ill, DO   1 mg at 04/01/22 0800   hydrOXYzine (ATARAX) tablet 50 mg  50 mg Oral Q6H PRN Cortez Flippen, [name], MD   50 mg at 04/01/22 1421   loratadine (CLARITIN) tablet 10 mg  10 mg Oral Daily Rex Magee T, MD   10 mg at 04/01/22 0800   magnesium hydroxide (MILK OF MAGNESIA) suspension 30 mL  30 mL Oral Daily PRN Charm Rings, NP   30 mL at 03/26/22 0925   naltrexone (DEPADE) tablet 50 mg  50 mg Oral Daily Charm Rings, NP   50 mg at 04/01/22 9937    nicotine polacrilex (NICORETTE) gum 2 mg  2 mg Oral PRN Sarina Ill, DO   2 mg at 04/01/22 1421   QUEtiapine (SEROQUEL) tablet 200 mg  200 mg Oral QHS Austen Wygant T, MD   200 mg at 03/31/22 2102   risperiDONE (RISPERDAL) tablet 0.5 mg  0.5 mg Oral [id number] Sarina Ill, DO   0.5 mg at 04/01/22 0800   senna-docusate (Senokot-S) tablet 2 tablet  2 tablet Oral Daily PRN Pearse Shiffler, [name], MD   2 tablet at 03/26/22 1724   thiamine (VITAMIN B1) tablet 100 mg  100 mg Oral Daily Charm Rings, NP   100 mg at 04/01/22 0800   traZODone (DESYREL) tablet 100 mg  100 mg Oral QHS PRN Charm Rings, NP   100 mg at 03/20/22 2129    Lab Results: No results found for this or any previous visit (from the past 48 hour(s)).  Blood Alcohol level:  Lab Results  Component Value Date   ETH 91 (H) 03/13/2022   ETH 108 (H) 10/29/2021    Metabolic Disorder Labs: Lab Results  Component Value Date   HGBA1C 5.2 03/24/2022   MPG 102.54 03/24/2022   No results found for: "PROLACTIN" Lab Results  Component Value Date   CHOL 219 (H) 03/24/2022   TRIG 476 (H) 03/24/2022   HDL 38 (L) 03/24/2022   CHOLHDL 5.8 03/24/2022   VLDL UNABLE TO CALCULATE IF TRIGLYCERIDE OVER 400 mg/dL 16/96/7893   LDLCALC UNABLE TO CALCULATE IF TRIGLYCERIDE OVER 400 mg/dL 81/07/7508   LDLCALC 258 (H) 05/30/2020    Physical Findings: AIMS: Facial and Oral Movements Muscles of Facial Expression: None, normal Lips and Perioral Area: None, normal Jaw: None, normal Tongue: None, normal,Extremity Movements Upper (arms, wrists, hands, fingers): None, normal Lower (legs, knees, ankles, toes): None, normal, Trunk Movements Neck, shoulders, hips: None, normal, Overall Severity Severity of abnormal movements (highest score from questions above): None, normal Incapacitation due to abnormal movements: None, normal Patient's awareness of abnormal movements (rate only patient's report): No Awareness, Dental  Status Current problems with teeth and/or dentures?: No Does patient usually wear dentures?: No  CIWA:  CIWA-Ar Total: 2 COWS:     Musculoskeletal: Strength & Muscle Tone: within normal limits Gait & Station: normal Patient leans: N/A  Psychiatric Specialty Exam:  Presentation  General Appearance: Appropriate for Environment  Eye Contact:Good  Speech:Clear and Coherent  Speech Volume:Normal  Handedness:Right   Mood and Affect  Mood:Depressed  Affect:Congruent   Thought Process  Thought Processes:Coherent  Descriptions of Associations:Intact  Orientation:Full (Time, Place and Person)  Thought Content:Perseveration  History of Schizophrenia/Schizoaffective disorder:No  Duration of Psychotic Symptoms:No data recorded Hallucinations:No data recorded Ideas of Reference:None  Suicidal Thoughts:No data recorded Homicidal Thoughts:No data recorded  Sensorium  Memory:Immediate Good  Judgment:Poor  Insight:Poor   Executive Functions  Concentration:Good  Attention Span:Good  Recall:Good  Fund of Knowledge:Fair  Language:Fair   Psychomotor Activity  Psychomotor Activity:No data recorded  Assets  Assets:Resilience; Physical Health; Social Support   Sleep  Sleep:No data recorded   Physical Exam: Physical Exam Vitals and nursing note reviewed.  Constitutional:      Appearance: Normal appearance.  HENT:     Head: Normocephalic and atraumatic.     Mouth/Throat:     Pharynx: Oropharynx is clear.  Eyes:     Pupils: Pupils are equal, round, and reactive to light.  Cardiovascular:     Rate and Rhythm: Normal rate and regular rhythm.  Pulmonary:     Effort: Pulmonary effort is normal.     Breath sounds: Normal breath sounds.  Abdominal:     General: Abdomen is flat.     Palpations: Abdomen is soft.  Musculoskeletal:        General: Normal range of motion.  Skin:    General: Skin is warm and dry.  Neurological:     General: No focal  deficit present.     Mental Status: He is alert. Mental status is at baseline.  Psychiatric:        Attention and Perception: Attention normal.        Mood and Affect: Mood normal.        Speech: Speech normal.        Behavior: Behavior normal.        Thought Content: Thought content normal.        Cognition and Memory: Cognition normal.    Review of Systems  Constitutional: Negative.   HENT: Negative.    Eyes: Negative.   Respiratory: Negative.    Cardiovascular: Negative.   Gastrointestinal: Negative.   Musculoskeletal: Negative.   Skin: Negative.   Neurological: Negative.   Psychiatric/Behavioral: Negative.     Blood pressure 123/85, pulse 67, temperature 98.2 F (36.8 C), temperature source Oral, resp. rate 20, height 5' 8.5" (1.74 m), weight 88.9 kg, SpO2 98 %. Body mass index is 29.36 kg/m.   Treatment Plan Summary: Plan restarted pantoprazole.  Ordered a 30-day supply of all of his medicines.  We will make preparations for transfer and discharge tomorrow morning  Mordecai Rasmussen, MD 04/01/2022, 3:38 PM

## 2022-04-01 NOTE — BHH Counselor (Signed)
CSW attempted contact with [facility] [phone]). Contact was unable to be established but HIPPA compliant voicemail left with contact information for follow through.   Vilma Meckel. [name], MSW, LCSW, LCAS 04/01/2022 11:14 AM

## 2022-04-01 NOTE — Progress Notes (Signed)
Recreation Therapy Notes   Date: 04/01/2022   Time: 10:00 am    Location: Craft room      Behavioral response: N/A   Intervention Topic: Time Management    Discussion/Intervention: Patient stopped by to state he was sick and did not want to get anyone else sick so he would not be attending group.    Clinical Observations/Feedback:  Patient stopped by to state he was sick and did not want to get anyone else sick so he would not be attending group.   Barnell Shieh LRT/CTRS       Ellyanna Holton 04/01/2022 11:17 AM

## 2022-04-02 ENCOUNTER — Other Ambulatory Visit: Payer: Self-pay

## 2022-04-02 MED ORDER — HYDROXYZINE HCL 50 MG PO TABS
50.0000 mg | ORAL_TABLET | Freq: Four times a day (QID) | ORAL | 1 refills | Status: DC | PRN
Start: 2022-04-02 — End: 2024-05-03

## 2022-04-02 MED ORDER — CITALOPRAM HYDROBROMIDE 40 MG PO TABS
40.0000 mg | ORAL_TABLET | Freq: Every day | ORAL | 1 refills | Status: DC
Start: 2022-04-02 — End: 2024-05-03

## 2022-04-02 MED ORDER — PANTOPRAZOLE SODIUM 20 MG PO TBEC
20.0000 mg | DELAYED_RELEASE_TABLET | Freq: Every day | ORAL | 1 refills | Status: DC
Start: 2022-04-02 — End: 2024-05-03

## 2022-04-02 MED ORDER — LORATADINE 10 MG PO TABS
10.0000 mg | ORAL_TABLET | Freq: Every day | ORAL | 1 refills | Status: DC
Start: 2022-04-02 — End: 2024-05-03

## 2022-04-02 MED ORDER — NICOTINE POLACRILEX 2 MG MT GUM
2.0000 mg | CHEWING_GUM | OROMUCOSAL | 0 refills | Status: DC | PRN
Start: 2022-04-02 — End: 2024-05-03

## 2022-04-02 MED ORDER — AMLODIPINE BESYLATE 5 MG PO TABS
5.0000 mg | ORAL_TABLET | Freq: Every day | ORAL | 1 refills | Status: DC
Start: 2022-04-02 — End: 2024-05-03

## 2022-04-02 MED ORDER — NALTREXONE HCL 50 MG PO TABS
50.0000 mg | ORAL_TABLET | Freq: Every day | ORAL | 1 refills | Status: DC
Start: 2022-04-02 — End: 2024-05-03

## 2022-04-02 MED ORDER — QUETIAPINE FUMARATE 200 MG PO TABS: 200.0000 mg | ORAL_TABLET | Freq: Every day | ORAL | 1 refills | Status: DC

## 2022-04-02 MED ORDER — RISPERIDONE 0.5 MG PO TABS: 0.5000 mg | ORAL_TABLET | ORAL | 1 refills | Status: DC

## 2022-04-02 MED ORDER — TRAZODONE HCL 100 MG PO TABS
100.0000 mg | ORAL_TABLET | Freq: Every evening | ORAL | 1 refills | Status: DC | PRN
Start: 2022-04-02 — End: 2024-05-03

## 2022-04-02 NOTE — Plan of Care (Signed)
  Problem: Group Participation Goal: STG - Patient will engage in groups without prompting or encouragement from LRT x3 group sessions within 5 recreation therapy group sessions Description: STG - Patient will engage in groups without prompting or encouragement from LRT x3 group sessions within 5 recreation therapy group sessions Outcome: Completed/Met

## 2022-04-02 NOTE — Progress Notes (Signed)
Recreation Therapy Notes    Date: 04/02/2022   Time: 10:20 am    Location: Court yard     Behavioral response: N/A   Intervention Topic: Decision Making    Discussion/Intervention: Patient refused to attend group.    Clinical Observations/Feedback:  Patient refused to attend group.    Damarko Stitely LRT/CTRS        Elaine Middleton 04/02/2022 10:44 AM

## 2022-04-02 NOTE — Progress Notes (Signed)
  [facility] Adult Case Management Discharge Plan :  Will you be returning to the same living situation after discharge:  No. At discharge, do you have transportation home?: Yes,  CSW arranged transportation. Do you have the ability to pay for your medications: No.  Release of information consent forms completed and in the chart;  Patient's signature needed at discharge.  Patient to Follow up at:  Follow-up Information     [facility] Follow up.   Specialty: Behavioral Health Why: Call to schedule appointment. Thanks! Contact information: [address] [place] [address] [phone]        [facility] Follow [facility].   Why: Another resource: They have walk-in hours Monday through Friday, 6am-11am. The earlier you arrive the more likely you are to be seen. Thanks! Contact information: [address] [place], [place] [address] Office: [phone] Fax: [phone]                Next level of care provider has access to [facility] Link:no  Safety Planning and Suicide Prevention discussed: Yes,  SPE completed with the pt.      Has patient been referred to the Quitline?: Patient refused referral  Patient has been referred for addiction treatment: Yes  Glenis Smoker, LCSW 04/02/2022, 11:19 AM

## 2022-04-02 NOTE — BHH Suicide Risk Assessment (Signed)
[facility] Discharge Suicide Risk Assessment   Principal Problem: Major depressive disorder, recurrent severe without psychotic features (HCC) Discharge Diagnoses: Principal Problem:   Major depressive disorder, recurrent severe without psychotic features (HCC)   Total Time spent with patient: 30 minutes  Musculoskeletal: Strength & Muscle Tone: within normal limits Gait & Station: normal Patient leans: N/A  Psychiatric Specialty Exam  Presentation  General Appearance: Appropriate for Environment  Eye Contact:Good  Speech:Clear and Coherent  Speech Volume:Normal  Handedness:Right   Mood and Affect  Mood:Depressed  Duration of Depression Symptoms: Greater than two weeks  Affect:Congruent   Thought Process  Thought Processes:Coherent  Descriptions of Associations:Intact  Orientation:Full (Time, Place and Person)  Thought Content:Perseveration  History of Schizophrenia/Schizoaffective disorder:No  Duration of Psychotic Symptoms:No data recorded Hallucinations:No data recorded Ideas of Reference:None  Suicidal Thoughts:No data recorded Homicidal Thoughts:No data recorded  Sensorium  Memory:Immediate Good  Judgment:Poor  Insight:Poor   Executive Functions  Concentration:Good  Attention Span:Good  Recall:Good  Fund of Knowledge:Fair  Language:Fair   Psychomotor Activity  Psychomotor Activity:No data recorded  Assets  Assets:Resilience; Physical Health; Social Support   Sleep  Sleep:No data recorded  Physical Exam: Physical Exam Vitals and nursing note reviewed.  Constitutional:      Appearance: Normal appearance.  HENT:     Head: Normocephalic and atraumatic.     Mouth/Throat:     Pharynx: Oropharynx is clear.  Eyes:     Pupils: Pupils are equal, round, and reactive to light.  Cardiovascular:     Rate and Rhythm: Normal rate and regular rhythm.  Pulmonary:     Effort: Pulmonary effort is normal.     Breath sounds: Normal breath  sounds.  Abdominal:     General: Abdomen is flat.     Palpations: Abdomen is soft.  Musculoskeletal:        General: Normal range of motion.  Skin:    General: Skin is warm and dry.  Neurological:     General: No focal deficit present.     Mental Status: He is alert. Mental status is at baseline.  Psychiatric:        Attention and Perception: Attention normal. He is attentive.        Mood and Affect: Mood normal.        Speech: Speech normal.        Behavior: Behavior normal.        Thought Content: Thought content normal.        Cognition and Memory: Cognition normal.        Judgment: Judgment normal.    Review of Systems  Constitutional: Negative.   HENT: Negative.    Eyes: Negative.   Respiratory: Negative.    Cardiovascular: Negative.   Gastrointestinal: Negative.   Musculoskeletal: Negative.   Skin: Negative.   Neurological: Negative.   Psychiatric/Behavioral: Negative.     Blood pressure 121/84, pulse 76, temperature 98.3 F (36.8 C), temperature source Oral, resp. rate 18, height 5' 8.5" (1.74 m), weight 88.9 kg, SpO2 99 %. Body mass index is 29.36 kg/m.  Mental Status Per Nursing Assessment::   On Admission:  Suicidal ideation indicated by patient, Self-harm thoughts, Self-harm behaviors  Demographic Factors:  Male, Caucasian, and Unemployed  Loss Factors: Financial problems/change in socioeconomic status  Historical Factors: Impulsivity  Risk Reduction Factors:   Positive coping skills or problem solving skills  Continued Clinical Symptoms:  Depression:   Comorbid alcohol abuse/dependence Alcohol/Substance Abuse/Dependencies  Cognitive Features That Contribute To Risk:  None  Suicide Risk:  Minimal: No identifiable suicidal ideation.  Patients presenting with no risk factors but with morbid ruminations; may be classified as minimal risk based on the severity of the depressive symptoms   Follow-up Information     [facility] Follow up.   Specialty: Behavioral Health Why: Call to schedule appointment. Thanks! Contact information: [address] [place] [address] [phone]        [facility] Follow [facility].   Why: Another resource: They have walk-in hours Monday through Friday, 6am-11am. The earlier you arrive the more likely you are to be seen. Thanks! Contact information: [address] [place], [place] [address] Office: [phone] Fax: [phone]                Plan Of Care/Follow-up recommendations:  Other:  Referred for outpatient and rehab substance abuse treatment.  Continue current medicine.  Patient denies all suicidal ideation and is not showing any dangerous behavior.  Mordecai Rasmussen, MD 04/02/2022, 10:35 AM

## 2022-04-02 NOTE — BHH Counselor (Signed)
CSW attempted to contact [facility] and [facility] regarding aftercare appointment. Contact unable to be established and HIPPA compliant voicemail left with contact information for follow through.   Lee Sparks. [name], MSW, LCSW, LCAS 04/02/2022 1:44 PM

## 2022-04-02 NOTE — Progress Notes (Signed)
Recreation Therapy Notes  INPATIENT RECREATION TR PLAN  Patient Details Name: Lee Sparks MRN: [id number] DOB: November 04, 1970 Today's Date: 04/02/2022  Rec Therapy Plan Is patient appropriate for Therapeutic Recreation?: Yes Treatment times per week: at least 3 Estimated Length of Stay: 5-7 days TR Treatment/Interventions: Group participation (Comment)  Discharge Criteria Pt will be discharged from therapy if:: Discharged Treatment plan/goals/alternatives discussed and agreed upon by:: Patient/family  Discharge Summary Short term goals set: Patient will focus on task/topic with 2 prompts from staff within 5 recreation therapy group sessions Short term goals met: Complete Progress toward goals comments: Groups attended Which groups?: Social skills, Stress management, Wellness, Other (Comment) (Relaxation) Reason goals not met: N/A Therapeutic equipment acquired: N/A Reason patient discharged from therapy: Discharge from hospital Pt/family agrees with progress & goals achieved: Yes Date patient discharged from therapy: 04/02/22   Margreat Widener 04/02/2022, 10:52 AM

## 2022-04-02 NOTE — Progress Notes (Signed)
Patient pleasant and cooperative on approach. Denies SI,HI and AVH. Verbalized understanding discharge instructions and follow up care. Prescriptions and 30 days medicines given to patient. All belongings from [facility] locker room returned to patient. Patient escorted out by staff and transported by cab.

## 2022-04-11 NOTE — Discharge Summary (Signed)
Physician Discharge Summary Note  Patient:  Lee Sparks is an 51 y.o., male MRN:  [id number] DOB:  1971/04/30 Patient phone:  [phone] (home)  Patient address:   [facility] [place] [address],  Total Time spent with patient: 30 minutes  Date of Admission:  03/15/2022 Date of Discharge: 04-02-2022  Reason for Admission: Patient was admitted because of return of symptoms of major depression including suicidal ideation  Principal Problem: Major depressive disorder, recurrent severe without psychotic features (HCC) Discharge Diagnoses: Principal Problem:   Major depressive disorder, recurrent severe without psychotic features (HCC)   Past Psychiatric History: Patient has a history of recurrent severe depression with suicidal thoughts accompanied and complicated by alcohol abuse  Past Medical History:  Past Medical History:  Diagnosis Date   Alcohol-induced pancreatitis    Anemia    Asthma    Bronchitis     Past Surgical History:  Procedure Laterality Date   CYSTECTOMY     ESOPHAGOGASTRODUODENOSCOPY  07/28/2011   Procedure: ESOPHAGOGASTRODUODENOSCOPY (EGD);  Surgeon: Freddy Jaksch, MD;  Location: [facility] ENDOSCOPY;  Service: Endoscopy;  Laterality: N/A;   HERNIA REPAIR     Family History:  Family History  Problem Relation Age of Onset   Hypertension Father    Aneurysm Father    Cancer Other    Hypertension Mother    Pneumonia Mother    Family Psychiatric  History: See previous Social History:  Social History   Substance and Sexual Activity  Alcohol Use Yes   Alcohol/week: 50.0 standard drinks of alcohol   Types: 50 Cans of beer per week   Comment: Equivalent of two 40 oz beers daily.     Social History   Substance and Sexual Activity  Drug Use No    Social History   Socioeconomic History   Marital status: Single    Spouse name: Not on file   Number of children: Not on file   Years of education: Not on file   Highest education level: Not on file   Occupational History   Not on file  Tobacco Use   Smoking status: Every Day    Packs/day: 0.50    Years: 15.00    Total pack years: 7.50    Types: Cigarettes    Last attempt to quit: 06/27/2011    Years since quitting: 10.7   Smokeless tobacco: Never  Substance and Sexual Activity   Alcohol use: Yes    Alcohol/week: 50.0 standard drinks of alcohol    Types: 50 Cans of beer per week    Comment: Equivalent of two 40 oz beers daily.   Drug use: No   Sexual activity: Not Currently  Other Topics Concern   Not on file  Social History Narrative   Not on file   Social Determinants of Health   Financial Resource Strain: Not on file  Food Insecurity: Not on file  Transportation Needs: Not on file  Physical Activity: Not on file  Stress: Not on file  Social Connections: Not on file    Hospital Course: Admitted to psychiatric unit.  15-minute checks maintained.  Patient was cooperative with treatment and interacted appropriately with staff and treatment providers.  Medications were adjusted for treatment of depression and chronic anxiety.  Patient was monitored daily for dangerousness and response to medication.  He expressed a desire to go to a substance abuse rehab program.  Social work staff were able to locate a program to except the patient.  He was discharged with a plan  to be going to a rehab program for substance abuse treatment and was provided with a supply of medicine at the time of discharge.  At discharge he was denying suicidal ideation was not psychotic and appeared to be upbeat and feeling much better.  Physical Findings: AIMS: Facial and Oral Movements Muscles of Facial Expression: None, normal Lips and Perioral Area: None, normal Jaw: None, normal Tongue: None, normal,Extremity Movements Upper (arms, wrists, hands, fingers): None, normal Lower (legs, knees, ankles, toes): None, normal, Trunk Movements Neck, shoulders, hips: None, normal, Overall Severity Severity  of abnormal movements (highest score from questions above): None, normal Incapacitation due to abnormal movements: None, normal Patient's awareness of abnormal movements (rate only patient's report): No Awareness, Dental Status Current problems with teeth and/or dentures?: No Does patient usually wear dentures?: No  CIWA:  CIWA-Ar Total: 2 COWS:     Musculoskeletal: Strength & Muscle Tone: within normal limits Gait & Station: normal Patient leans: N/A   Psychiatric Specialty Exam:  Presentation  General Appearance: Appropriate for Environment  Eye Contact:Good  Speech:Clear and Coherent  Speech Volume:Normal  Handedness:Right   Mood and Affect  Mood:Depressed  Affect:Congruent   Thought Process  Thought Processes:Coherent  Descriptions of Associations:Intact  Orientation:Full (Time, Place and Person)  Thought Content:Perseveration  History of Schizophrenia/Schizoaffective disorder:No  Duration of Psychotic Symptoms:No data recorded Hallucinations:No data recorded Ideas of Reference:None  Suicidal Thoughts:No data recorded Homicidal Thoughts:No data recorded  Sensorium  Memory:Immediate Good  Judgment:Poor  Insight:Poor   Executive Functions  Concentration:Good  Attention Span:Good  Recall:Good  Fund of Knowledge:Fair  Language:Fair   Psychomotor Activity  Psychomotor Activity:No data recorded  Assets  Assets:Resilience; Physical Health; Social Support   Sleep  Sleep:No data recorded   Physical Exam: Physical Exam Constitutional:      Appearance: Normal appearance.  HENT:     Head: Normocephalic and atraumatic.     Mouth/Throat:     Pharynx: Oropharynx is clear.  Eyes:     Pupils: Pupils are equal, round, and reactive to light.  Cardiovascular:     Rate and Rhythm: Normal rate and regular rhythm.  Pulmonary:     Effort: Pulmonary effort is normal.     Breath sounds: Normal breath sounds.  Abdominal:     General: Abdomen  is flat.     Palpations: Abdomen is soft.  Musculoskeletal:        General: Normal range of motion.  Skin:    General: Skin is warm and dry.  Neurological:     General: No focal deficit present.     Mental Status: He is alert. Mental status is at baseline.  Psychiatric:        Mood and Affect: Mood normal.        Thought Content: Thought content normal.    Review of Systems  Constitutional: Negative.   HENT: Negative.    Eyes: Negative.   Respiratory: Negative.    Cardiovascular: Negative.   Gastrointestinal: Negative.   Musculoskeletal: Negative.   Skin: Negative.   Neurological: Negative.   Psychiatric/Behavioral: Negative.     Blood pressure 121/84, pulse 76, temperature 98.3 F (36.8 C), temperature source Oral, resp. rate 18, height 5' 8.5" (1.74 m), weight 88.9 kg, SpO2 99 %. Body mass index is 29.36 kg/m.   Social History   Tobacco Use  Smoking Status Every Day   Packs/day: 0.50   Years: 15.00   Total pack years: 7.50   Types: Cigarettes   Last attempt to quit: 06/27/2011  Years since quitting: 10.7  Smokeless Tobacco Never   Tobacco Cessation:  N/A, patient does not currently use tobacco products   Blood Alcohol level:  Lab Results  Component Value Date   ETH 91 (H) 03/13/2022   ETH 108 (H) 10/29/2021    Metabolic Disorder Labs:  Lab Results  Component Value Date   HGBA1C 5.2 03/24/2022   MPG 102.54 03/24/2022   No results found for: "PROLACTIN" Lab Results  Component Value Date   CHOL 219 (H) 03/24/2022   TRIG 476 (H) 03/24/2022   HDL 38 (L) 03/24/2022   CHOLHDL 5.8 03/24/2022   VLDL UNABLE TO CALCULATE IF TRIGLYCERIDE OVER 400 mg/dL 40/98/1191   LDLCALC UNABLE TO CALCULATE IF TRIGLYCERIDE OVER 400 mg/dL 47/82/9562   LDLCALC 130 (H) 05/30/2020    See Psychiatric Specialty Exam and Suicide Risk Assessment completed by Attending Physician prior to discharge.  Discharge destination:  Home  Is patient on multiple antipsychotic therapies  at discharge:  No   Has Patient had three or more failed trials of antipsychotic monotherapy by history:  No  Recommended Plan for Multiple Antipsychotic Therapies: NA  Discharge Instructions     Diet - low sodium heart healthy   Complete by: As directed    Increase activity slowly   Complete by: As directed       Allergies as of 04/02/2022       Reactions   Grapefruit Concentrate Hives   Grapefruit Diet Or [extra Strength Grapefruit] Hives   Pt states he has a reaction to grapefruit.         Medication List     STOP taking these medications    aspirin EC 81 MG tablet   buPROPion 150 MG 24 hr tablet Commonly known as: WELLBUTRIN XL   FLUoxetine 20 MG capsule Commonly known as: PROZAC   fluticasone 50 MCG/ACT nasal spray Commonly known as: FLONASE   ibuprofen 600 MG tablet Commonly known as: ADVIL       TAKE these medications      Indication  amLODipine 5 MG tablet Commonly known as: NORVASC Take 1 tablet (5 mg total) by mouth daily.  Indication: High Blood Pressure Disorder   citalopram 40 MG tablet Commonly known as: CELEXA Take 1 tablet (40 mg total) by mouth daily.  Indication: Generalized Anxiety Disorder, Major Depressive Disorder   hydrOXYzine 50 MG tablet Commonly known as: ATARAX Take 1 tablet (50 mg total) by mouth every 6 (six) hours as needed for anxiety (sleep).  Indication: Feeling Anxious   loratadine 10 MG tablet Commonly known as: CLARITIN Take 1 tablet (10 mg total) by mouth daily.  Indication: Perennial Allergic Rhinitis   naltrexone 50 MG tablet Commonly known as: DEPADE Take 1 tablet (50 mg total) by mouth daily.  Indication: Abuse or Misuse of Alcohol   nicotine polacrilex 2 MG gum Commonly known as: NICORETTE Take 1 each (2 mg total) by mouth as needed for smoking cessation.  Indication: Nicotine Addiction   pantoprazole 20 MG tablet Commonly known as: PROTONIX Take 1 tablet (20 mg total) by mouth daily.   Indication: Gastroesophageal Reflux Disease   QUEtiapine 200 MG tablet Commonly known as: SEROQUEL Take 1 tablet (200 mg total) by mouth at bedtime. What changed:  medication strength how much to take when to take this reasons to take this  Indication: Generalized Anxiety Disorder, Major Depressive Disorder   risperiDONE 0.5 MG tablet Commonly known as: RISPERDAL Take 1 tablet (0.5 mg total) by mouth 2 (  two) times daily at 8 am and 4 pm.  Indication: Major Depressive Disorder   traZODone 100 MG tablet Commonly known as: DESYREL Take 1 tablet (100 mg total) by mouth at bedtime as needed for sleep.  Indication: Trouble Sleeping        Follow-up Information     [facility] Follow up.   Specialty: Behavioral Health Why: Call to schedule appointment. Thanks! Contact information: [phone] [place] [address] [phone]        [facility] Follow [facility].   Why: Another resource: They have walk-in hours Monday through Friday, 6am-11am. The earlier you arrive the more likely you are to be seen. Thanks! Contact information: [address], [place] [address] Office: [phone] Fax: [phone]                Follow-up recommendations:  Other:  Patient is referred to outpatient treatment in [place].  Continue current medicine.  Supply of medicine and prescriptions provided  Comments: See above  Signed: Mordecai Rasmussen, MD 04/11/2022, 2:30 PM

## 2022-06-01 IMAGING — CT CT ANGIO CHEST
2 of 6 series · 17 of 46 positions shown · IV contrast (APPLIED)
Comparison: CT abdomen and pelvis 05/01/2017

CLINICAL DATA: Abdominal distention and chest pain.  Intoxicated.

EXAM:
CT ANGIOGRAPHY CHEST
CT ABDOMEN AND PELVIS WITH CONTRAST
TECHNIQUE: Multidetector CT imaging of the chest was performed using the
standard protocol during bolus administration of intravenous
contrast. Multiplanar CT image reconstructions and MIPs were
obtained to evaluate the vascular anatomy. Multidetector CT imaging
of the abdomen and pelvis was performed using the standard protocol
during bolus administration of intravenous contrast.
CONTRAST:  100mL OMNIPAQUE IOHEXOL 350 MG/ML SOLN

[Series 5: thins · axial · 0.76mm/px · z∈[-487,-257]mm · 14 of 252 slices shown]
[im 11/252  lung]
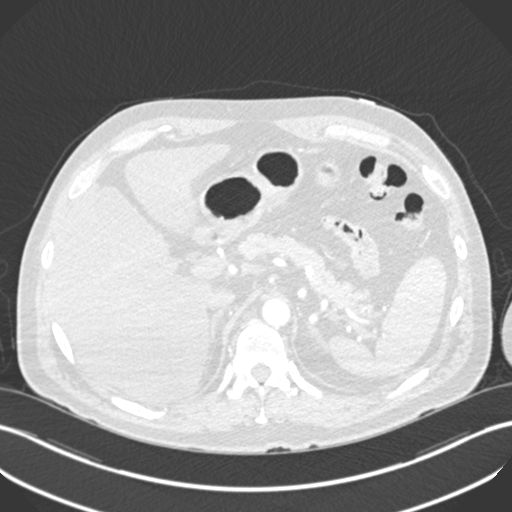
[im 33/252  soft-tissue]
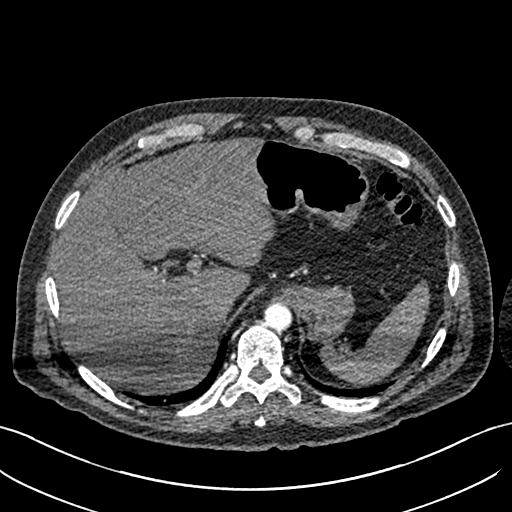
[im 44/252  lung]
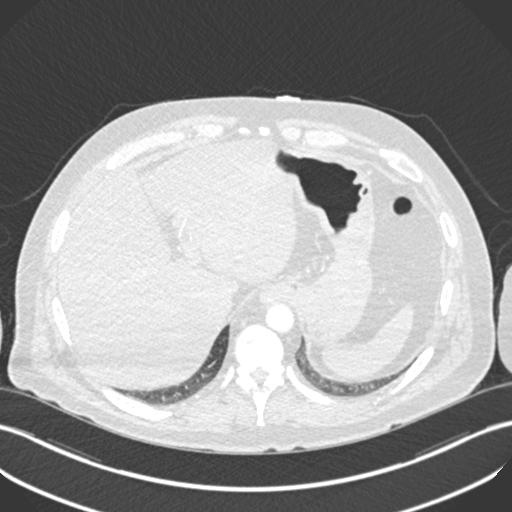
[im 66/252  soft-tissue]
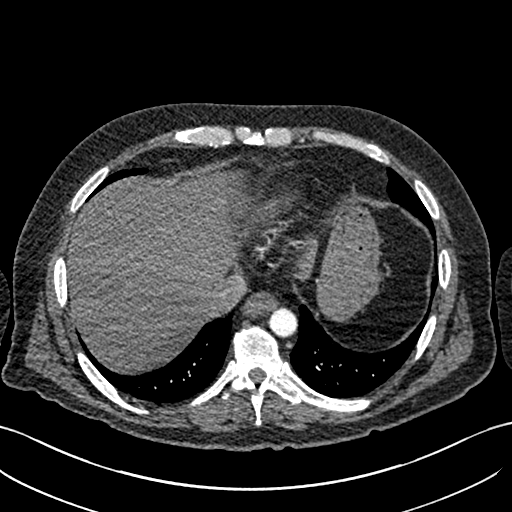
[im 88/252  lung]
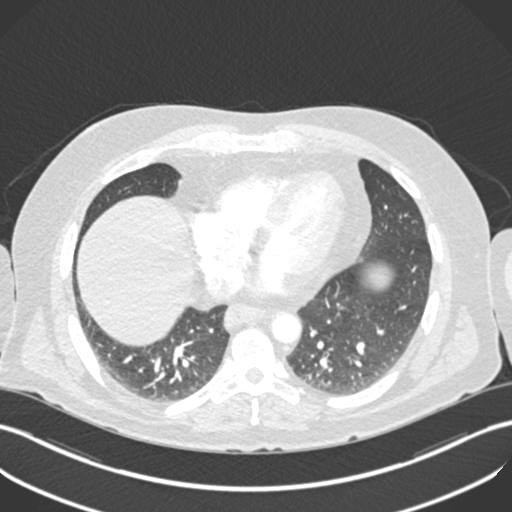
[im 99/252  soft-tissue]
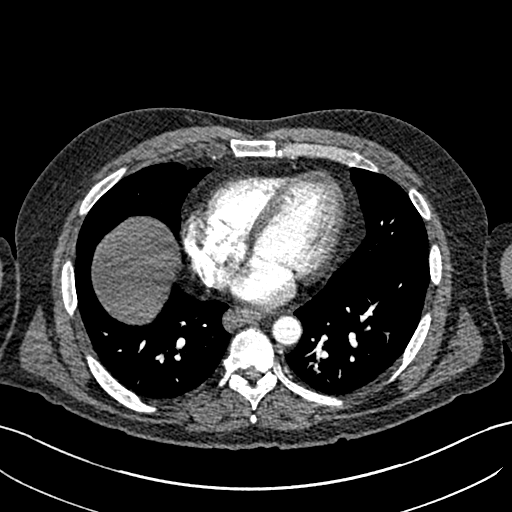
[im 121/252  lung]
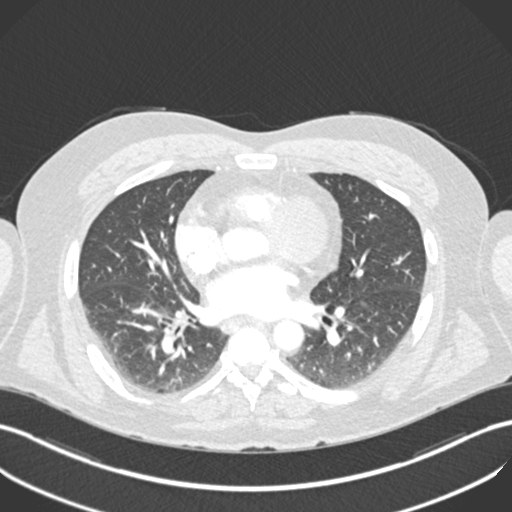
[im 131/252  soft-tissue]
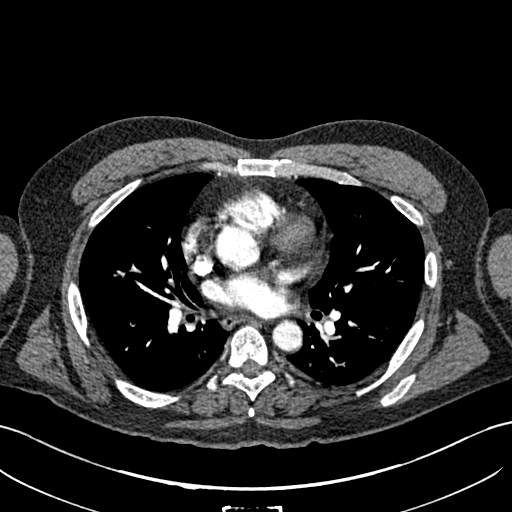
[im 153/252  lung]
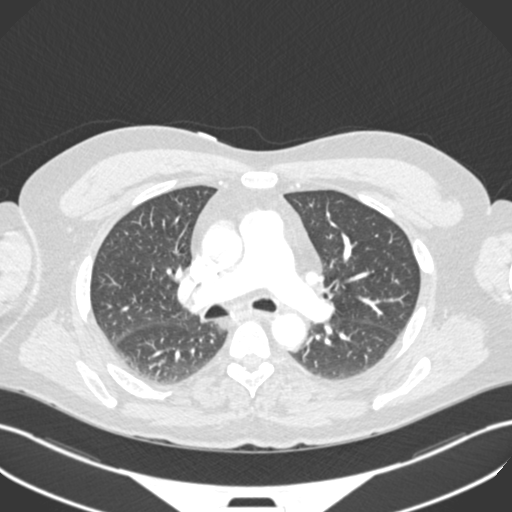
[im 164/252  soft-tissue]
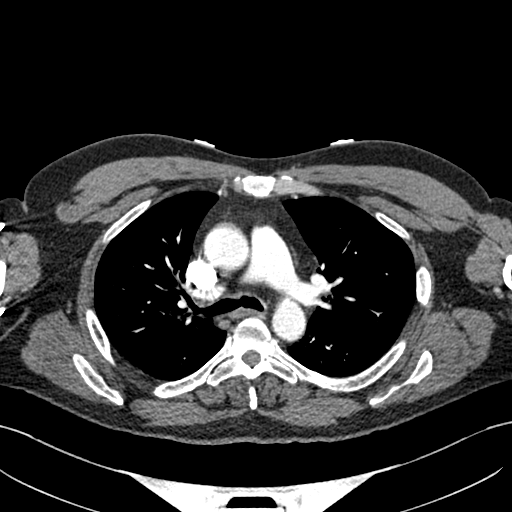
[im 186/252  lung]
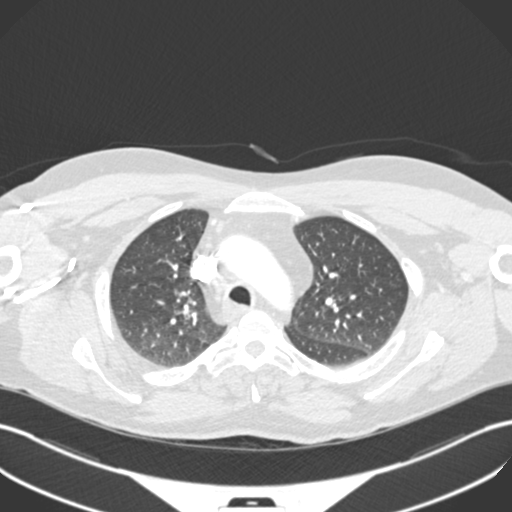
[im 208/252  soft-tissue]
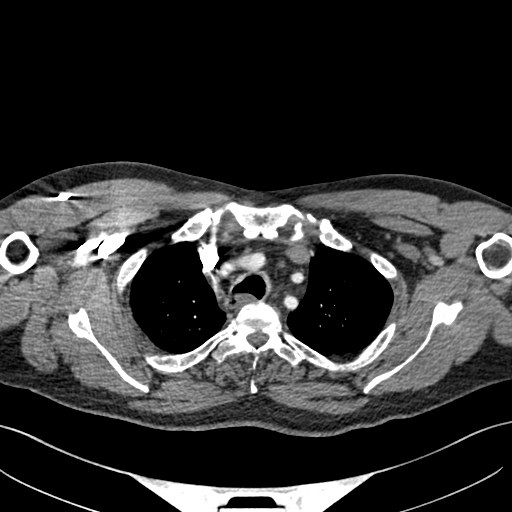
[im 219/252  lung]
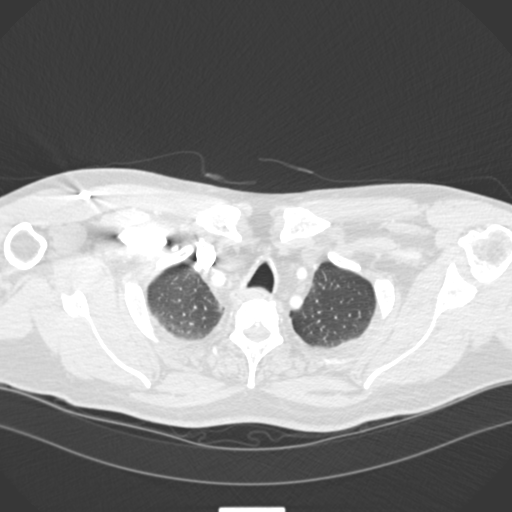
[im 241/252  soft-tissue]
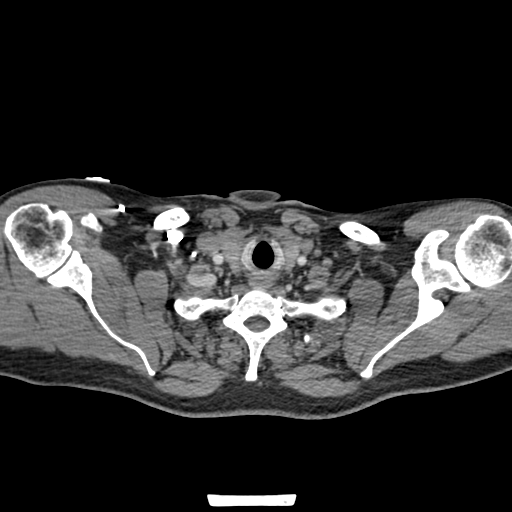

[Series 7: coronal mpr · coronal · 0.50mm/px · 3 of 126 slices shown]
[im 32/126  soft-tissue]
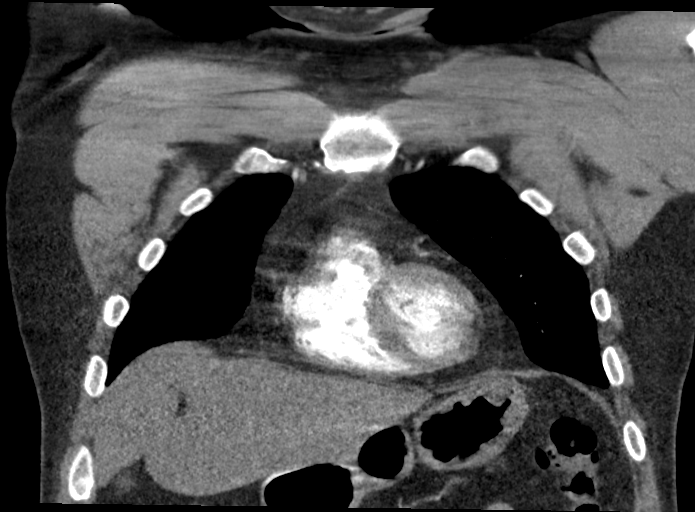
[im 63/126  soft-tissue]
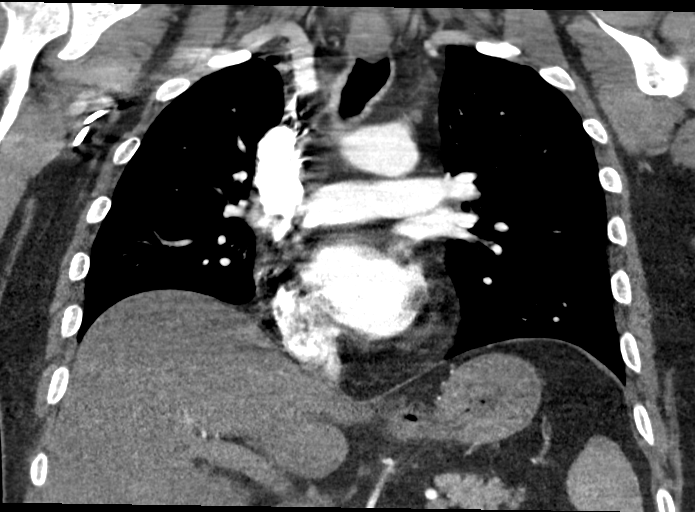
[im 94/126  soft-tissue]
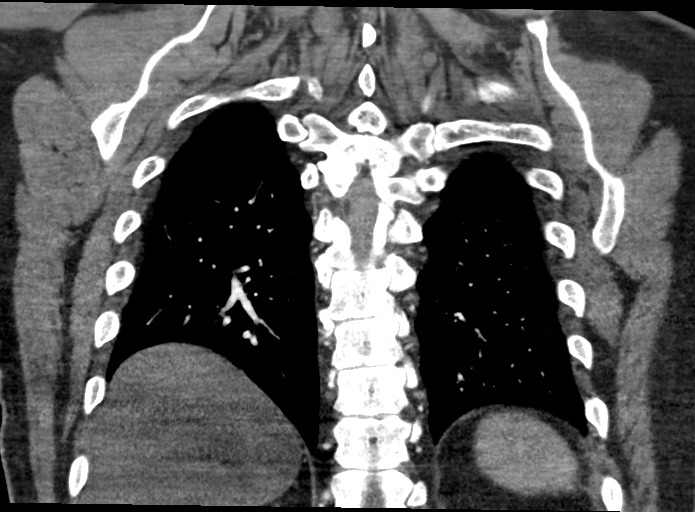

[17 of 46 positions shown; findings below may reference images not displayed]

FINDINGS: CTA CHEST FINDINGS

Cardiovascular: Good opacification of the central and segmental
pulmonary arteries. No focal filling defects. No evidence of
significant pulmonary embolus. Normal heart size. No pericardial
effusions. Normal caliber thoracic aorta. No evidence of aortic
dissection. Great vessel origins are patent.

Mediastinum/Nodes: Esophagus is decompressed. Suggestion of distal
esophageal wall thickening which may indicate reflux or esophagitis.
No significant lymphadenopathy.

Lungs/Pleura: Pot emphysematous changes in the apices. Lungs are
otherwise clear and expanded. No pleural effusions. No pneumothorax.

Musculoskeletal: No chest wall abnormality. No acute or significant
osseous findings.

Review of the MIP images confirms the above findings.

CT ABDOMEN and PELVIS FINDINGS

Hepatobiliary: Diffuse fatty infiltration of the liver. No focal
lesions identified. Gallbladder and bile ducts are unremarkable.

Pancreas: Unremarkable. No pancreatic ductal dilatation or
surrounding inflammatory changes.

Spleen: Normal in size without focal abnormality.

Adrenals/Urinary Tract: Adrenal glands are unremarkable. Kidneys are
normal, without renal calculi, focal lesion, or hydronephrosis.
Bladder is unremarkable.

Stomach/Bowel: Stomach, small bowel, and colon are not abnormally
distended. No wall thickening or inflammatory changes are
appreciated. Appendix is normal.

Vascular/Lymphatic: No significant vascular findings are present. No
enlarged abdominal or pelvic lymph nodes.

Reproductive: Prostate is unremarkable.

Other: No free air or free fluid in the abdomen. Subxiphoid ventral
abdominal wall hernia containing fat.

Musculoskeletal: Spondylolysis with mild spondylolisthesis at L5-S1.
Mild degenerative changes in the spine. No destructive bone lesions.

Review of the MIP images confirms the above findings.
IMPRESSION: 1. No evidence of significant pulmonary embolus.
2. No evidence of active pulmonary disease.
3. Diffuse fatty infiltration of the liver.
4. Subxiphoid ventral abdominal wall hernia containing fat.
5. Spondylolysis with mild spondylolisthesis at L5-S1.

## 2022-06-01 IMAGING — CT CT ABD-PELV W/ CM
2 of 5 series · 15 of 46 positions shown, 17 images · IV contrast (APPLIED)
Comparison: CT abdomen and pelvis 05/01/2017

CLINICAL DATA: Abdominal distention and chest pain.  Intoxicated.

EXAM:
CT ANGIOGRAPHY CHEST
CT ABDOMEN AND PELVIS WITH CONTRAST
TECHNIQUE: Multidetector CT imaging of the chest was performed using the
standard protocol during bolus administration of intravenous
contrast. Multiplanar CT image reconstructions and MIPs were
obtained to evaluate the vascular anatomy. Multidetector CT imaging
of the abdomen and pelvis was performed using the standard protocol
during bolus administration of intravenous contrast.
CONTRAST:  100mL OMNIPAQUE IOHEXOL 350 MG/ML SOLN

[Series 2: axial st · axial · 0.78mm/px · z∈[-858,-403]mm · 12 of 103 slices shown, 14 images]
[im 6/103  soft-tissue]
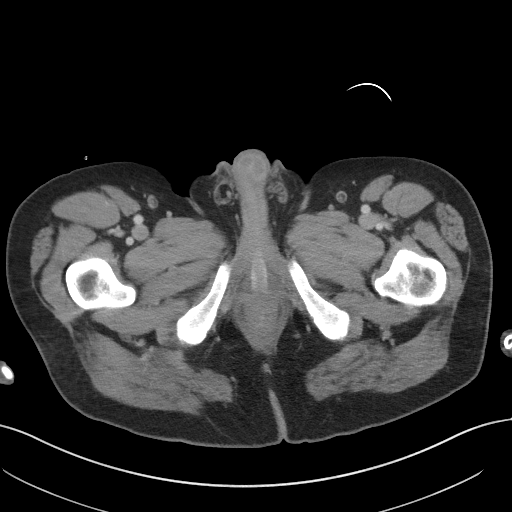
[im 6/103  bone]
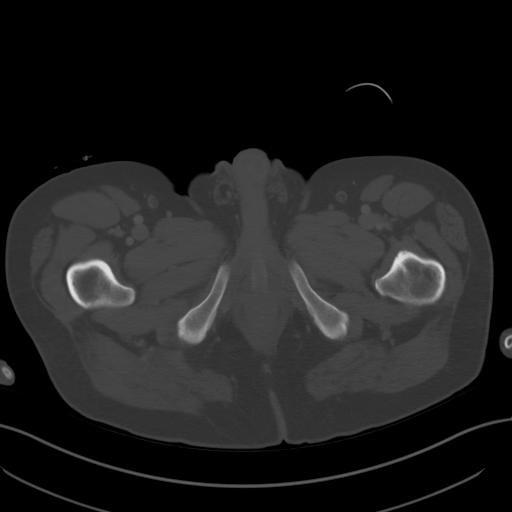
[im 17/103  soft-tissue]
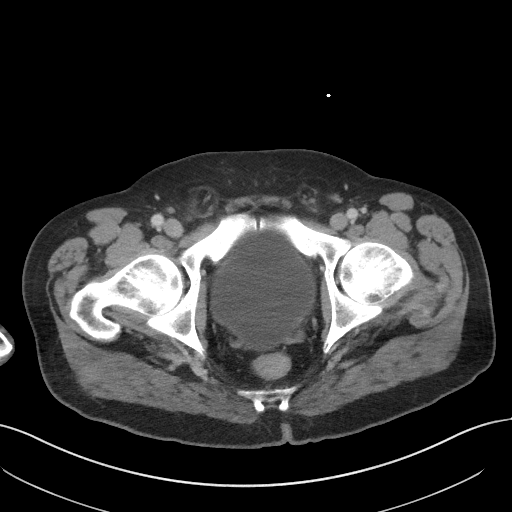
[im 22/103  soft-tissue]
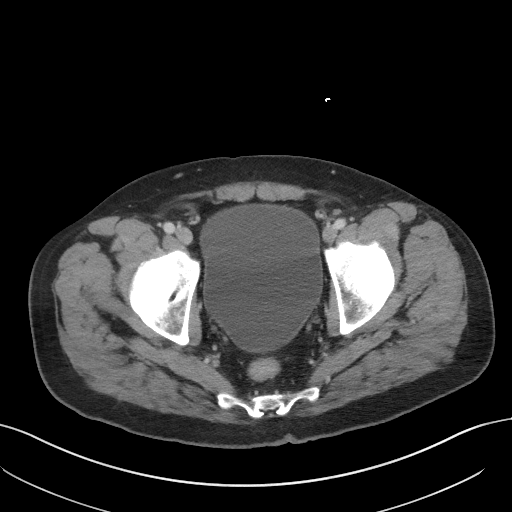
[im 33/103  soft-tissue]
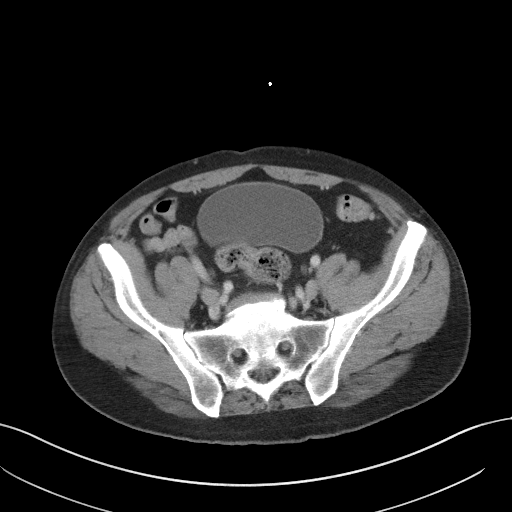
[im 38/103  soft-tissue]
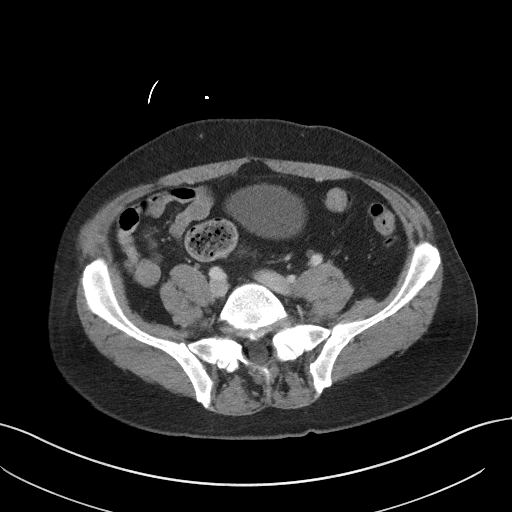
[im 49/103  soft-tissue]
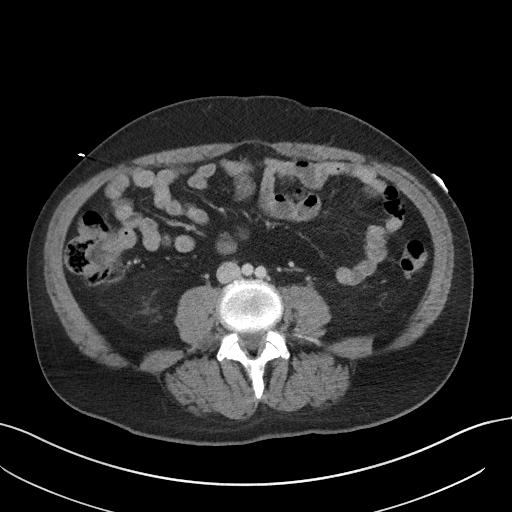
[im 54/103  soft-tissue]
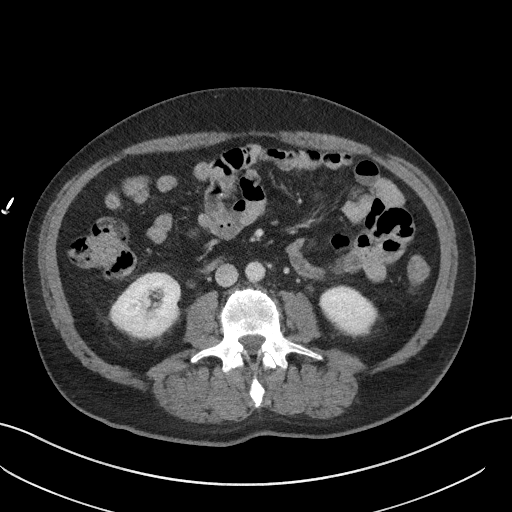
[im 65/103  soft-tissue]
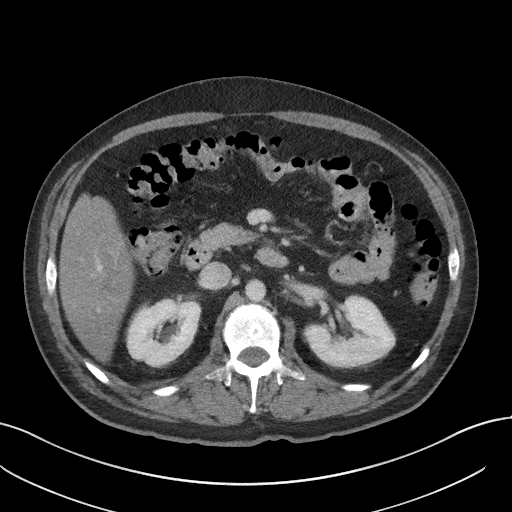
[im 70/103  soft-tissue]
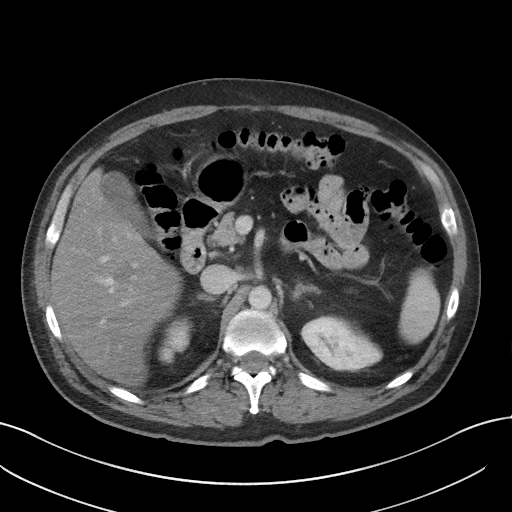
[im 70/103  bone]
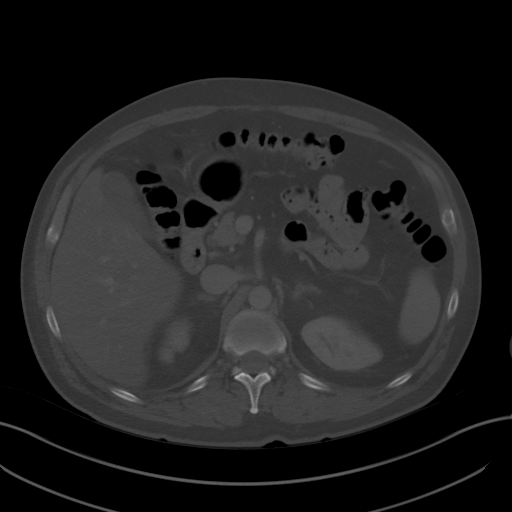
[im 81/103  soft-tissue]
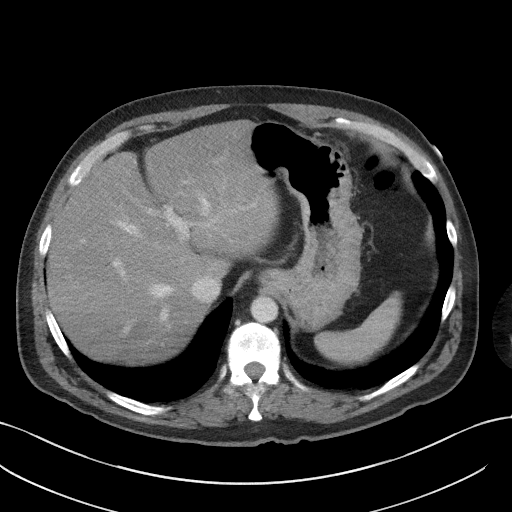
[im 86/103  soft-tissue]
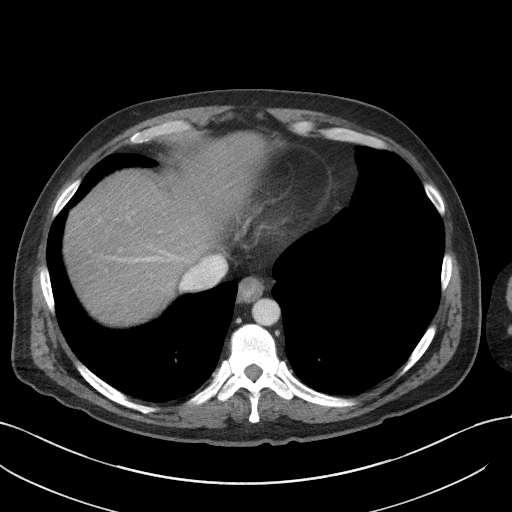
[im 97/103  soft-tissue]
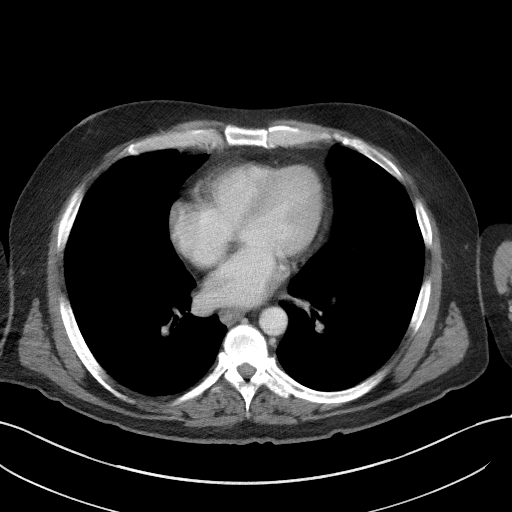

[Series 5: coronal st · coronal · 0.75mm/px · 3 of 91 slices shown]
[im 31/91  soft-tissue]
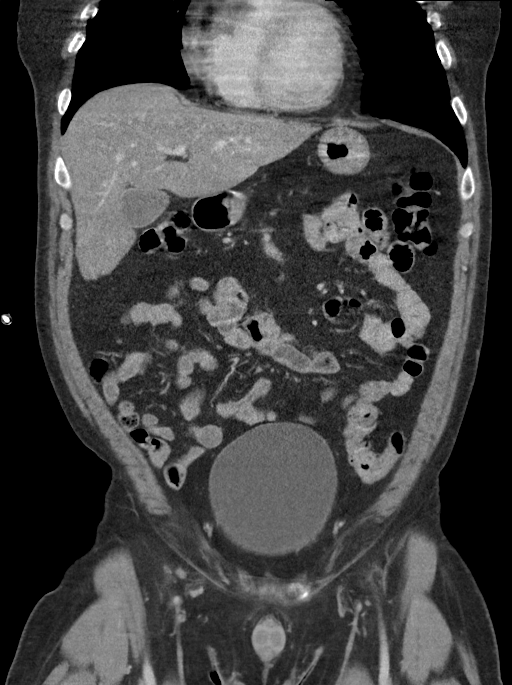
[im 41/91  soft-tissue]
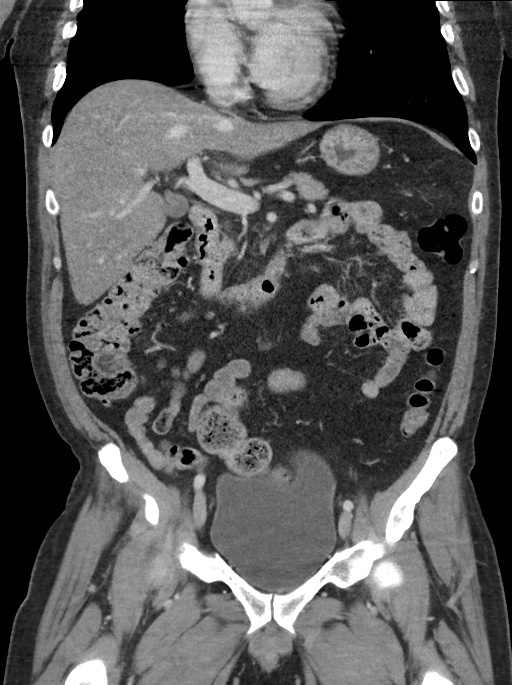
[im 51/91  soft-tissue]
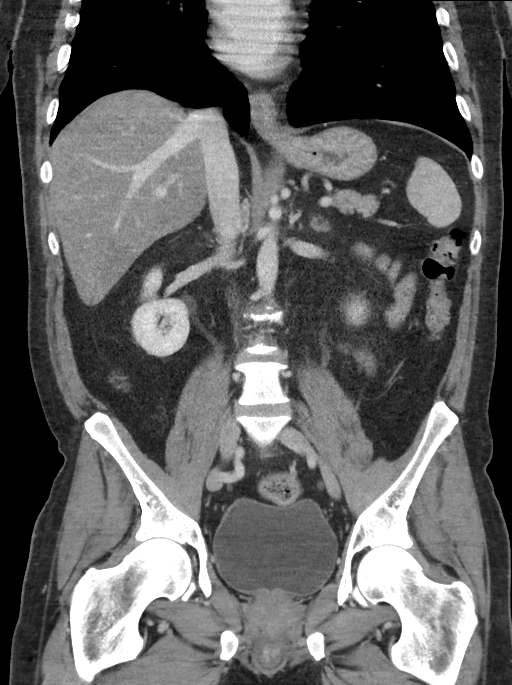

[15 of 46 positions shown; findings below may reference images not displayed]

FINDINGS: CTA CHEST FINDINGS

Cardiovascular: Good opacification of the central and segmental
pulmonary arteries. No focal filling defects. No evidence of
significant pulmonary embolus. Normal heart size. No pericardial
effusions. Normal caliber thoracic aorta. No evidence of aortic
dissection. Great vessel origins are patent.

Mediastinum/Nodes: Esophagus is decompressed. Suggestion of distal
esophageal wall thickening which may indicate reflux or esophagitis.
No significant lymphadenopathy.

Lungs/Pleura: Pot emphysematous changes in the apices. Lungs are
otherwise clear and expanded. No pleural effusions. No pneumothorax.

Musculoskeletal: No chest wall abnormality. No acute or significant
osseous findings.

Review of the MIP images confirms the above findings.

CT ABDOMEN and PELVIS FINDINGS

Hepatobiliary: Diffuse fatty infiltration of the liver. No focal
lesions identified. Gallbladder and bile ducts are unremarkable.

Pancreas: Unremarkable. No pancreatic ductal dilatation or
surrounding inflammatory changes.

Spleen: Normal in size without focal abnormality.

Adrenals/Urinary Tract: Adrenal glands are unremarkable. Kidneys are
normal, without renal calculi, focal lesion, or hydronephrosis.
Bladder is unremarkable.

Stomach/Bowel: Stomach, small bowel, and colon are not abnormally
distended. No wall thickening or inflammatory changes are
appreciated. Appendix is normal.

Vascular/Lymphatic: No significant vascular findings are present. No
enlarged abdominal or pelvic lymph nodes.

Reproductive: Prostate is unremarkable.

Other: No free air or free fluid in the abdomen. Subxiphoid ventral
abdominal wall hernia containing fat.

Musculoskeletal: Spondylolysis with mild spondylolisthesis at L5-S1.
Mild degenerative changes in the spine. No destructive bone lesions.

Review of the MIP images confirms the above findings.
IMPRESSION: 1. No evidence of significant pulmonary embolus.
2. No evidence of active pulmonary disease.
3. Diffuse fatty infiltration of the liver.
4. Subxiphoid ventral abdominal wall hernia containing fat.
5. Spondylolysis with mild spondylolisthesis at L5-S1.

## 2023-04-23 ENCOUNTER — Other Ambulatory Visit (HOSPITAL_COMMUNITY): Payer: Self-pay

## 2024-02-16 ENCOUNTER — Ambulatory Visit: Admitting: Nurse Practitioner

## 2024-02-22 ENCOUNTER — Ambulatory Visit: Admitting: Nurse Practitioner

## 2024-03-25 ENCOUNTER — Other Ambulatory Visit: Payer: Self-pay | Admitting: Nurse Practitioner

## 2024-03-25 ENCOUNTER — Ambulatory Visit: Admitting: Nurse Practitioner

## 2024-03-30 ENCOUNTER — Encounter: Payer: Self-pay | Admitting: Nurse Practitioner

## 2024-03-31 ENCOUNTER — Telehealth: Payer: Self-pay | Admitting: Physician Assistant

## 2024-03-31 NOTE — Telephone Encounter (Unsigned)
 Copied from CRM [id number]. Topic: Clinical - Medication Refill >> Mar 31, 2024  1:49 PM Santiya F wrote: Medication: traZODone  (DESYREL ) 100 MG tablet [[id number]], hydrOXYzine  (ATARAX ) 50 MG tablet [[id number]]  Has the patient contacted their pharmacy? Yes  (Agent: If yes, when and what did the pharmacy advise?) Contact office   This is the patient's preferred pharmacy:  [facility] Address: [address] [place], [place] [address] Phone: [phone]  Is this the correct pharmacy for this prescription? Yes If no, delete pharmacy and type the correct one.   Has the prescription been filled recently? Yes  Is the patient out of the medication? No, patient has one of each pill left.   Has the patient been seen for an appointment in the last year OR does the patient have an upcoming appointment? Yes  Can we respond through MyChart? No  Agent: Please be advised that Rx refills may take up to 3 business days. We ask that you follow-up with your pharmacy.  Patient is requesting a call back.

## 2024-04-01 ENCOUNTER — Telehealth: Payer: Self-pay

## 2024-04-01 NOTE — Telephone Encounter (Signed)
 Copied from CRM [id number]. Topic: Clinical - Medication Question >> Mar 31, 2024  9:58 AM Travis F wrote: Reason for CRM: Patient is calling in because he wants to know if he scheduled a new patient appointment would the provider be able to refill his medication because he is going to run out prior to October and the first available appointment is in October. Scheduled an appointment in October.  Hydroxyzine  and Trazodone  are the medications. Patient is requesting a call back. >> Mar 31, 2024  4:25 PM Fonda T wrote: Received call from patient, checking status of medication refill request sent today.   Medications: hydrOXYzine  (ATARAX ) 50 MG tablet, and traZODone  (DESYREL ) 100 MG tablet  Per chart review, patient has not been seen, and has a new patient visit scheduled on 04/29/24.    Patient advised that RX refills can take up to 3 business days to process. At time of call refill is pending.   Patient can be reached at [phone].

## 2024-04-01 NOTE — Telephone Encounter (Signed)
 Left message advising patient to contact previous prescriber for medication needs.

## 2024-04-23 ENCOUNTER — Emergency Department: Payer: MEDICAID

## 2024-04-23 ENCOUNTER — Emergency Department
Admission: EM | Admit: 2024-04-23 | Discharge: 2024-04-23 | Disposition: A | Discharge status: Home / Self Care | Payer: MEDICAID | Attending: Emergency Medicine | Admitting: Emergency Medicine

## 2024-04-23 ENCOUNTER — Other Ambulatory Visit: Payer: Self-pay

## 2024-04-23 DIAGNOSIS — W19XXXA Unspecified fall, initial encounter: Secondary | ICD-10-CM | POA: Insufficient documentation

## 2024-04-23 DIAGNOSIS — S022XXA Fracture of nasal bones, initial encounter for closed fracture: Secondary | ICD-10-CM | POA: Diagnosis not present

## 2024-04-23 DIAGNOSIS — F10920 Alcohol use, unspecified with intoxication, uncomplicated: Secondary | ICD-10-CM

## 2024-04-23 DIAGNOSIS — F10120 Alcohol abuse with intoxication, uncomplicated: Secondary | ICD-10-CM | POA: Insufficient documentation

## 2024-04-23 DIAGNOSIS — S0993XA Unspecified injury of face, initial encounter: Secondary | ICD-10-CM | POA: Diagnosis present

## 2024-04-23 NOTE — ED Notes (Signed)
 Pt ambulatory to bathroom with steady gate. Sister in law at bedside to escort pt back home.

## 2024-04-23 NOTE — ED Triage Notes (Signed)
 Pt arrives via EMS from home, pt +ETOH, got up to use bathroom and fell on face, - thinners per EMS

## 2024-04-23 NOTE — ED Provider Notes (Signed)
 [facility] Provider Note    Event Date/Time   First MD Initiated Contact with Patient 04/23/24 0225     (approximate)   History   Fall and Alcohol Intoxication   HPI  Lee Sparks is a 53 y.o. male with history of substance abuse, alcohol abuse, presenting with facial pain after falling.  Patient states that he drank 12 pack, denies other drug use, got up to go to the bathroom and fell on his face.  He denies any SI or HI.  States that he wants go back to work.  Per independent history from EMS, he had dried blood around his nose, no active bleeding, not on any blood thinners.  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     Physical Exam   Triage Vital Signs: ED Triage Vitals  Encounter Vitals Group     BP 04/23/24 0213 (!) 140/91     Girls Systolic BP Percentile --      Girls Diastolic BP Percentile --      Boys Systolic BP Percentile --      Boys Diastolic BP Percentile --      Pulse Rate 04/23/24 0213 (!) 103     Resp 04/23/24 0213 19     Temp 04/23/24 0213 98.4 F (36.9 C)     Temp Source 04/23/24 0213 Oral     SpO2 04/23/24 0213 99 %     Weight 04/23/24 0217 200 lb (90.7 kg)     Height 04/23/24 0217 5' 8 (1.727 m)     Head Circumference --      Peak Flow --      Pain Score 04/23/24 0216 4     Pain Loc --      Pain Education --      Exclude from Growth Chart --     Most recent vital signs: Vitals:   04/23/24 0300 04/23/24 0630  BP: 106/63   Pulse: 98 93  Resp:    Temp:    SpO2: 98% 97%     General: Awake, no distress.  CV:  Good peripheral perfusion.  Resp:  Normal effort.  No thoracic cage tenderness Abd:  No distention.  Soft nontender Other:  No palpable skull deformities or tenderness, he does have dried blood around his nare without nasal septal hematoma, he does have tenderness to the bridge of his nose, there is mild swelling, no intraoral lesions, no mid  spinal tenderness, no tenderness to the all his extremities, no focal weakness or numbness.   ED Results / Procedures / Treatments   Labs (all labs ordered are listed, but only abnormal results are displayed) Labs Reviewed - No data to display    RADIOLOGY On my independent interpretation, CT head without obvious intracranial hemorrhage   PROCEDURES:  Critical Care performed: No  Procedures   MEDICATIONS ORDERED IN ED: Medications - No data to display   IMPRESSION / MDM / ASSESSMENT AND PLAN / ED COURSE  I reviewed the triage vital signs and the nursing notes.                              Differential diagnosis includes, but is not limited to, alcohol intoxication, contusion, fracture, intracranial hemorrhage, no active bleed at this time to his nose.  Will get CT head, max face, cervical spine.  Will monitor for sobriety.  Patient's  presentation is most consistent with acute presentation with potential threat to life or bodily function.  Independent interpretation of labs and imaging below.  CT imaging shows right nasal bone fracture, given tenderness there, suspect this might be related to the fall.  Discussed with patient about outpatient follow-up with ENT, he states that he cannot get a ride at this time, will observe in the emergency department while he sobers up.  Considered but no indication for inpatient admission at this time, he safe for outpatient management.  He was discharged when sober with strict return precautions and to follow-up with primary care.     Clinical Course as of 04/23/24 [id number]  Sat Apr 23, 2024  0415 CT Head Wo Contrast IMPRESSION: CT HEAD:  1. No acute intracranial abnormality. 2. Mild age-related cerebral atrophy.  CT MAXILLOFACIAL:  1. Fracture deformities of the right nasal bone and nasal septum, favored to be chronic in nature, although correlation with physical exam is recommended. 2. No other acute maxillofacial injury.  CT  CERVICAL SPINE:  1. No acute traumatic injury within the cervical spine. 2. Mild degenerative spondylosis at C4-5 through C7-T1.   [TT]    Clinical Course User Index [TT] Waymond Lorelle Cummins, MD     FINAL CLINICAL IMPRESSION(S) / ED DIAGNOSES   Final diagnoses:  Alcoholic intoxication without complication  Fall, initial encounter  Closed fracture of nasal bone, initial encounter     Rx / DC Orders   ED Discharge Orders          Ordered    Ambulatory Referral to Primary Care (Establish Care)        04/23/24 0426             Note:  This document was prepared using Dragon voice recognition software and may include unintentional dictation errors.    Waymond Lorelle Cummins, MD 04/23/24 [phone]

## 2024-04-23 NOTE — Discharge Instructions (Signed)
 For your nasal bone fracture, I provided a number for you to call for ENT to schedule follow-up.  I have also provided you with primary care referral.

## 2024-04-23 NOTE — ED Notes (Signed)
 Fall risk bundle is currently in place.

## 2024-04-29 ENCOUNTER — Ambulatory Visit: Payer: MEDICAID | Admitting: Physician Assistant

## 2024-05-03 ENCOUNTER — Telehealth: Payer: Self-pay

## 2024-05-03 ENCOUNTER — Ambulatory Visit (INDEPENDENT_AMBULATORY_CARE_PROVIDER_SITE_OTHER): Payer: MEDICAID | Admitting: Family Medicine

## 2024-05-03 ENCOUNTER — Encounter: Payer: Self-pay | Admitting: Family Medicine

## 2024-05-03 VITALS — BP 146/90 | HR 91 | Ht 68.0 in | Wt 215.0 lb

## 2024-05-03 DIAGNOSIS — F419 Anxiety disorder, unspecified: Secondary | ICD-10-CM | POA: Diagnosis not present

## 2024-05-03 DIAGNOSIS — F5101 Primary insomnia: Secondary | ICD-10-CM

## 2024-05-03 DIAGNOSIS — Z125 Encounter for screening for malignant neoplasm of prostate: Secondary | ICD-10-CM

## 2024-05-03 DIAGNOSIS — Z131 Encounter for screening for diabetes mellitus: Secondary | ICD-10-CM | POA: Diagnosis not present

## 2024-05-03 DIAGNOSIS — Z13220 Encounter for screening for lipoid disorders: Secondary | ICD-10-CM | POA: Diagnosis not present

## 2024-05-03 DIAGNOSIS — I10 Essential (primary) hypertension: Secondary | ICD-10-CM | POA: Diagnosis not present

## 2024-05-03 DIAGNOSIS — Z130 Encounter for screening for diseases of the blood and blood-forming organs and certain disorders involving the immune mechanism: Secondary | ICD-10-CM

## 2024-05-03 DIAGNOSIS — Z136 Encounter for screening for cardiovascular disorders: Secondary | ICD-10-CM

## 2024-05-03 DIAGNOSIS — T7840XA Allergy, unspecified, initial encounter: Secondary | ICD-10-CM | POA: Insufficient documentation

## 2024-05-03 DIAGNOSIS — T7840XD Allergy, unspecified, subsequent encounter: Secondary | ICD-10-CM

## 2024-05-03 MED ORDER — PROPRANOLOL HCL 10 MG PO TABS: 20.0000 mg | ORAL_TABLET | Freq: Two times a day (BID) | ORAL | 5 refills | Status: AC

## 2024-05-03 MED ORDER — LORATADINE 10 MG PO TABS: 10.0000 mg | ORAL_TABLET | Freq: Every day | ORAL | 1 refills | Status: AC

## 2024-05-03 MED ORDER — HYDROXYZINE PAMOATE 50 MG PO CAPS: 50.0000 mg | ORAL_CAPSULE | Freq: Four times a day (QID) | ORAL | 5 refills | Status: AC | PRN

## 2024-05-03 MED ORDER — LORATADINE 10 MG PO TABS: 10.0000 mg | ORAL_TABLET | Freq: Every day | ORAL | 1 refills | Status: DC

## 2024-05-03 MED ORDER — AMLODIPINE BESYLATE 5 MG PO TABS
5.0000 mg | ORAL_TABLET | Freq: Every day | ORAL | 1 refills | Status: AC
Start: 2024-05-03 — End: ?

## 2024-05-03 MED ORDER — TRAZODONE HCL 100 MG PO TABS: 100.0000 mg | ORAL_TABLET | Freq: Every evening | ORAL | 1 refills | Status: AC | PRN

## 2024-05-03 NOTE — Telephone Encounter (Signed)
 Copied from CRM [id number]. Topic: Clinical - Prescription Issue >> May 03, 2024  2:12 PM Ivette P wrote: Reason for CRM: Pt called in because [facility] called pt stating they did not receive medication refills for him.   [facility] states they have no refill information for  hydrOXYzine  (VISTARIL ) 50 MG capsule propranolol (INDERAL) 10 MG tablet   Shows pharmacy confirmed received.   Pls follow up with pt [phone]

## 2024-05-03 NOTE — Progress Notes (Signed)
 New Patient Office Visit  Subjective    Patient ID: Lee Sparks, male    DOB: August 03, 1970  Age: 53 y.o. MRN: [id number]  CC:  Chief Complaint  Patient presents with   Establish Care    Patient is here to establish care, recently went to the ER      Assessment & Plan:   Essential hypertension -     amLODIPine  Besylate; Take 1 tablet (5 mg total) by mouth daily.  Dispense: 90 tablet; Refill: 1 -     Comprehensive metabolic panel with GFR  Allergy, subsequent encounter  Anxiety -     hydrOXYzine  Pamoate; Take 1 capsule (50 mg total) by mouth every 6 (six) hours as needed for anxiety.  Dispense: 120 capsule; Refill: 5 -     Propranolol HCl; Take 2 tablets (20 mg total) by mouth 2 (two) times daily.  Dispense: 120 tablet; Refill: 5  Encounter for lipid screening for cardiovascular disease -     Lipid panel  Diabetes mellitus screening -     Hemoglobin A1c  Screening for prostate cancer -     PSA  Screening for iron deficiency anemia -     CBC with Differential/Platelet  Primary insomnia  Other orders -     traZODone  HCl; Take 1 tablet (100 mg total) by mouth at bedtime as needed for sleep.  Dispense: 90 tablet; Refill: 1 -     Loratadine ; Take 1 tablet (10 mg total) by mouth daily.  Dispense: 90 tablet; Refill: 1   Assessment and Plan Assessment & Plan Hypertension Blood pressure elevated at 146/90. Current medications: amlodipine , propranolol. - Continue amlodipine  and propranolol. - Monitor blood pressure regularly. - Return for evaluation if blood pressure remains elevated.  Tachycardia Heart rate elevated, possibly due to alcohol, anxiety, or caffeine. No chest pain or shortness of breath. - Monitor heart rate. - Consider EKG or cardiology referral if tachycardia persists.  Anxiety Anxiety managed with hydroxyzine  and propranolol. Declined Zoloft or Prozac . - Continue current medications for anxiety. - Consider medication adjustment if anxiety  worsens.  Alcohol use disorder, in remission Consumes a 12-pack of beer on days off. Discussed risks of alcohol use. Not interested in rehab or medication adjustment. - Encourage reduction in alcohol consumption. - Offer support for alcohol reduction if desired in the future.    Return in about 4 weeks (around 05/31/2024) for Annual.   Vinary K Kassiah Mccrory, MD   HPI   Lee Sparks presents to establish care Discussed the use of AI scribe software for clinical note transcription with the patient, who gave verbal consent to proceed.  History of Present Illness Lee Sparks is a 53 year old male with hypertension and anxiety who presents for medication management.  He manages his hypertension with amlodipine  and propranolol. He reports recent blood pressure readings such as 146/90 and a previous reading of 103/63. He takes propranolol for his elevated heart rate.  His anxiety is managed with hydroxyzine  and trazodone , the latter aiding in sleep. He experiences increased anxiety when not on medication, and alcohol use exacerbates his anxiety. He has previously tried Zoloft and Prozac  without success.  He consumes about a twelve-pack of beer on his days off and has a history of alcohol use disorder, including a previous rehab admission. He manages his alcohol use independently and denies current withdrawal symptoms. He does not consume hard liquor.  He quit smoking approximately a year and a half ago after smoking for about  25 years. He denies any current smoking habits.  He recently moved back to the area to be closer to his family, including his daughter and grandchildren. 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  He reports a recent fall out of bed, which led to an ER visit, but does not provide further details about the incident. No chest pain or shortness of breath.    Outpatient Encounter Medications as of  05/03/2024  Medication Sig   [DISCONTINUED] amLODipine  (NORVASC ) 5 MG tablet Take 1 tablet (5 mg total) by mouth daily.   [DISCONTINUED] hydrOXYzine  (VISTARIL ) 50 MG capsule Take 50 mg by mouth every 6 (six) hours as needed for anxiety.   [DISCONTINUED] loratadine  (CLARITIN ) 10 MG tablet Take 1 tablet (10 mg total) by mouth daily.   [DISCONTINUED] propranolol (INDERAL) 10 MG tablet Take 10 mg by mouth 3 (three) times daily. (Patient taking differently: Take 20 mg by mouth 2 (two) times daily.)   [DISCONTINUED] traZODone  (DESYREL ) 100 MG tablet Take 1 tablet (100 mg total) by mouth at bedtime as needed for sleep.   amLODipine  (NORVASC ) 5 MG tablet Take 1 tablet (5 mg total) by mouth daily.   hydrOXYzine  (VISTARIL ) 50 MG capsule Take 1 capsule (50 mg total) by mouth every 6 (six) hours as needed for anxiety.   loratadine  (CLARITIN ) 10 MG tablet Take 1 tablet (10 mg total) by mouth daily.   propranolol (INDERAL) 10 MG tablet Take 2 tablets (20 mg total) by mouth 2 (two) times daily.   traZODone  (DESYREL ) 100 MG tablet Take 1 tablet (100 mg total) by mouth at bedtime as needed for sleep.   [DISCONTINUED] citalopram  (CELEXA ) 40 MG tablet Take 1 tablet (40 mg total) by mouth daily.   [DISCONTINUED] hydrOXYzine  (ATARAX ) 50 MG tablet Take 1 tablet (50 mg total) by mouth every 6 (six) hours as needed for anxiety (sleep).   [DISCONTINUED] loratadine  (CLARITIN ) 10 MG tablet Take 1 tablet (10 mg total) by mouth daily.   [DISCONTINUED] naltrexone  (DEPADE) 50 MG tablet Take 1 tablet (50 mg total) by mouth daily.   [DISCONTINUED] nicotine  polacrilex (NICORETTE ) 2 MG gum Take 1 each (2 mg total) by mouth as needed for smoking cessation.   [DISCONTINUED] pantoprazole  (PROTONIX ) 20 MG tablet Take 1 tablet (20 mg total) by mouth daily.   [DISCONTINUED] QUEtiapine  (SEROQUEL ) 200 MG tablet Take 1 tablet (200 mg total) by mouth at bedtime.   [DISCONTINUED] risperiDONE  (RISPERDAL ) 0.5 MG tablet Take 1 tablet (0.5 mg  total) by mouth 2 (two) times daily at 8 am and 4 pm.   No facility-administered encounter medications on file as of 05/03/2024.      Review of Systems  All other systems reviewed and are negative.       Objective    BP (!) 146/90   Pulse 91   Ht 5' 8 (1.727 m)   Wt 215 lb (97.5 kg)   SpO2 97%   BMI 32.69 kg/m   Physical Exam Vitals and nursing note reviewed.  Constitutional:      Appearance: Normal appearance.  HENT:     Head: Normocephalic.     Right Ear: External ear normal.     Left Ear: External ear normal.  Eyes:     Conjunctiva/sclera: Conjunctivae normal.  Cardiovascular:     Rate and Rhythm: Normal rate.  Pulmonary:     Effort: Pulmonary effort is normal. No respiratory distress.  Abdominal:  Palpations: Abdomen is soft.  Musculoskeletal:        General: Normal range of motion.  Skin:    General: Skin is warm.  Neurological:     Mental Status: He is alert and oriented to person, place, and time.  Psychiatric:        Mood and Affect: Mood normal.

## 2024-05-03 NOTE — Telephone Encounter (Signed)
 Called pt he was able to get his medication.  KP

## 2024-06-03 ENCOUNTER — Encounter: Payer: MEDICAID | Admitting: Family Medicine

## 2024-06-03 ENCOUNTER — Encounter: Payer: Self-pay | Admitting: Family Medicine
